# Patient Record
Sex: Male | Born: 1960 | Race: White | Hispanic: No | Marital: Married
Health system: Southern US, Community
[De-identification: ages and names within clinical notes are randomized; demographics above are authoritative.]

## PROBLEM LIST (undated history)

## (undated) DIAGNOSIS — D496 Neoplasm of unspecified behavior of brain: Secondary | ICD-10-CM

---

## 2020-01-22 ENCOUNTER — Other Ambulatory Visit: Payer: Self-pay | Admitting: Family Medicine

## 2020-01-22 ENCOUNTER — Ambulatory Visit
Admission: RE | Admit: 2020-01-22 | Discharge: 2020-01-22 | Disposition: A | Discharge status: Home / Self Care | Payer: BC Managed Care – PPO | Source: Ambulatory Visit | Attending: Family Medicine | Admitting: Family Medicine

## 2020-01-22 DIAGNOSIS — R413 Other amnesia: Secondary | ICD-10-CM

## 2020-01-22 MED ORDER — IOPAMIDOL (ISOVUE-300) INJECTION 61%
75.0000 mL | Freq: Once | INTRAVENOUS | Status: AC | PRN
  Administered 2020-01-22: 75 mL via INTRAVENOUS

## 2020-01-26 ENCOUNTER — Other Ambulatory Visit (HOSPITAL_BASED_OUTPATIENT_CLINIC_OR_DEPARTMENT_OTHER): Payer: Self-pay | Admitting: Neurosurgery

## 2020-01-26 DIAGNOSIS — G9389 Other specified disorders of brain: Secondary | ICD-10-CM

## 2020-01-30 ENCOUNTER — Other Ambulatory Visit: Payer: Self-pay

## 2020-01-30 ENCOUNTER — Ambulatory Visit (HOSPITAL_BASED_OUTPATIENT_CLINIC_OR_DEPARTMENT_OTHER)
Admission: RE | Admit: 2020-01-30 | Discharge: 2020-01-30 | Disposition: A | Discharge status: Home / Self Care | Payer: BC Managed Care – PPO | Source: Ambulatory Visit | Attending: Neurosurgery | Admitting: Neurosurgery

## 2020-01-30 DIAGNOSIS — G9389 Other specified disorders of brain: Secondary | ICD-10-CM | POA: Insufficient documentation

## 2020-01-30 MED ORDER — GADOBUTROL 1 MMOL/ML IV SOLN
10.0000 mL | Freq: Once | INTRAVENOUS | Status: AC | PRN
  Administered 2020-01-30: 10 mL via INTRAVENOUS

## 2020-02-03 ENCOUNTER — Other Ambulatory Visit: Payer: Self-pay | Admitting: Neurosurgery

## 2020-02-11 ENCOUNTER — Inpatient Hospital Stay (HOSPITAL_COMMUNITY)
Admission: RE | Admit: 2020-02-11 | Discharge: 2020-04-12 | DRG: 025 | Disposition: A | Discharge status: Hospice | Payer: BC Managed Care – PPO | Attending: Neurosurgery | Admitting: Neurosurgery

## 2020-02-11 ENCOUNTER — Inpatient Hospital Stay (HOSPITAL_COMMUNITY)
Admission: AD | Admit: 2020-02-11 | Payer: BC Managed Care – PPO | Source: Other Acute Inpatient Hospital | Admitting: Internal Medicine

## 2020-02-11 DIAGNOSIS — J9600 Acute respiratory failure, unspecified whether with hypoxia or hypercapnia: Secondary | ICD-10-CM

## 2020-02-11 DIAGNOSIS — I10 Essential (primary) hypertension: Secondary | ICD-10-CM | POA: Diagnosis not present

## 2020-02-11 DIAGNOSIS — G936 Cerebral edema: Secondary | ICD-10-CM | POA: Diagnosis present

## 2020-02-11 DIAGNOSIS — R4182 Altered mental status, unspecified: Secondary | ICD-10-CM | POA: Diagnosis present

## 2020-02-11 DIAGNOSIS — F419 Anxiety disorder, unspecified: Secondary | ICD-10-CM | POA: Diagnosis present

## 2020-02-11 DIAGNOSIS — Z781 Physical restraint status: Secondary | ICD-10-CM

## 2020-02-11 DIAGNOSIS — R443 Hallucinations, unspecified: Secondary | ICD-10-CM | POA: Diagnosis not present

## 2020-02-11 DIAGNOSIS — J15212 Pneumonia due to Methicillin resistant Staphylococcus aureus: Secondary | ICD-10-CM | POA: Diagnosis present

## 2020-02-11 DIAGNOSIS — R569 Unspecified convulsions: Secondary | ICD-10-CM | POA: Diagnosis not present

## 2020-02-11 DIAGNOSIS — Z978 Presence of other specified devices: Secondary | ICD-10-CM | POA: Diagnosis not present

## 2020-02-11 DIAGNOSIS — C713 Malignant neoplasm of parietal lobe: Secondary | ICD-10-CM | POA: Diagnosis present

## 2020-02-11 DIAGNOSIS — R319 Hematuria, unspecified: Secondary | ICD-10-CM | POA: Diagnosis not present

## 2020-02-11 DIAGNOSIS — R451 Restlessness and agitation: Secondary | ICD-10-CM | POA: Diagnosis not present

## 2020-02-11 DIAGNOSIS — Z20822 Contact with and (suspected) exposure to covid-19: Secondary | ICD-10-CM | POA: Diagnosis present

## 2020-02-11 DIAGNOSIS — I712 Thoracic aortic aneurysm, without rupture: Secondary | ICD-10-CM | POA: Diagnosis present

## 2020-02-11 DIAGNOSIS — Z6836 Body mass index (BMI) 36.0-36.9, adult: Secondary | ICD-10-CM

## 2020-02-11 DIAGNOSIS — Z7189 Other specified counseling: Secondary | ICD-10-CM | POA: Diagnosis not present

## 2020-02-11 DIAGNOSIS — C719 Malignant neoplasm of brain, unspecified: Secondary | ICD-10-CM | POA: Diagnosis not present

## 2020-02-11 DIAGNOSIS — Z79899 Other long term (current) drug therapy: Secondary | ICD-10-CM | POA: Diagnosis not present

## 2020-02-11 DIAGNOSIS — R509 Fever, unspecified: Secondary | ICD-10-CM

## 2020-02-11 DIAGNOSIS — G9349 Other encephalopathy: Secondary | ICD-10-CM | POA: Diagnosis present

## 2020-02-11 DIAGNOSIS — J9811 Atelectasis: Secondary | ICD-10-CM | POA: Diagnosis not present

## 2020-02-11 DIAGNOSIS — F09 Unspecified mental disorder due to known physiological condition: Secondary | ICD-10-CM | POA: Diagnosis not present

## 2020-02-11 DIAGNOSIS — R4702 Dysphasia: Secondary | ICD-10-CM | POA: Diagnosis not present

## 2020-02-11 DIAGNOSIS — I4891 Unspecified atrial fibrillation: Secondary | ICD-10-CM | POA: Diagnosis not present

## 2020-02-11 DIAGNOSIS — G9340 Encephalopathy, unspecified: Secondary | ICD-10-CM

## 2020-02-11 DIAGNOSIS — R4701 Aphasia: Secondary | ICD-10-CM | POA: Diagnosis present

## 2020-02-11 DIAGNOSIS — R482 Apraxia: Secondary | ICD-10-CM | POA: Diagnosis not present

## 2020-02-11 DIAGNOSIS — Z0189 Encounter for other specified special examinations: Secondary | ICD-10-CM

## 2020-02-11 DIAGNOSIS — D649 Anemia, unspecified: Secondary | ICD-10-CM | POA: Diagnosis present

## 2020-02-11 DIAGNOSIS — Z66 Do not resuscitate: Secondary | ICD-10-CM | POA: Diagnosis present

## 2020-02-11 DIAGNOSIS — J9601 Acute respiratory failure with hypoxia: Secondary | ICD-10-CM | POA: Diagnosis not present

## 2020-02-11 DIAGNOSIS — E871 Hypo-osmolality and hyponatremia: Secondary | ICD-10-CM | POA: Diagnosis present

## 2020-02-11 DIAGNOSIS — R414 Neurologic neglect syndrome: Secondary | ICD-10-CM | POA: Diagnosis not present

## 2020-02-11 DIAGNOSIS — I471 Supraventricular tachycardia: Secondary | ICD-10-CM | POA: Diagnosis present

## 2020-02-11 DIAGNOSIS — J9690 Respiratory failure, unspecified, unspecified whether with hypoxia or hypercapnia: Secondary | ICD-10-CM

## 2020-02-11 DIAGNOSIS — J189 Pneumonia, unspecified organism: Secondary | ICD-10-CM

## 2020-02-11 DIAGNOSIS — Z515 Encounter for palliative care: Secondary | ICD-10-CM

## 2020-02-11 DIAGNOSIS — Y95 Nosocomial condition: Secondary | ICD-10-CM | POA: Diagnosis present

## 2020-02-11 DIAGNOSIS — G9341 Metabolic encephalopathy: Secondary | ICD-10-CM | POA: Diagnosis present

## 2020-02-11 DIAGNOSIS — N029 Recurrent and persistent hematuria with unspecified morphologic changes: Secondary | ICD-10-CM | POA: Diagnosis not present

## 2020-02-11 DIAGNOSIS — G9389 Other specified disorders of brain: Secondary | ICD-10-CM | POA: Diagnosis not present

## 2020-02-11 DIAGNOSIS — E876 Hypokalemia: Secondary | ICD-10-CM | POA: Diagnosis present

## 2020-02-11 DIAGNOSIS — I82611 Acute embolism and thrombosis of superficial veins of right upper extremity: Secondary | ICD-10-CM | POA: Diagnosis present

## 2020-02-11 DIAGNOSIS — R0989 Other specified symptoms and signs involving the circulatory and respiratory systems: Secondary | ICD-10-CM

## 2020-02-11 DIAGNOSIS — I480 Paroxysmal atrial fibrillation: Secondary | ICD-10-CM | POA: Diagnosis present

## 2020-02-11 DIAGNOSIS — R471 Dysarthria and anarthria: Secondary | ICD-10-CM | POA: Diagnosis not present

## 2020-02-11 DIAGNOSIS — R4586 Emotional lability: Secondary | ICD-10-CM | POA: Diagnosis not present

## 2020-02-11 DIAGNOSIS — R490 Dysphonia: Secondary | ICD-10-CM | POA: Diagnosis not present

## 2020-02-11 DIAGNOSIS — R609 Edema, unspecified: Secondary | ICD-10-CM | POA: Diagnosis not present

## 2020-02-11 DIAGNOSIS — Z4659 Encounter for fitting and adjustment of other gastrointestinal appliance and device: Secondary | ICD-10-CM

## 2020-02-11 DIAGNOSIS — I159 Secondary hypertension, unspecified: Secondary | ICD-10-CM | POA: Diagnosis not present

## 2020-02-11 DIAGNOSIS — I959 Hypotension, unspecified: Secondary | ICD-10-CM | POA: Diagnosis not present

## 2020-02-11 DIAGNOSIS — T380X5A Adverse effect of glucocorticoids and synthetic analogues, initial encounter: Secondary | ICD-10-CM | POA: Diagnosis not present

## 2020-02-11 LAB — MRSA PCR SCREENING: MRSA by PCR: POSITIVE — AB

## 2020-02-11 LAB — GLUCOSE, CAPILLARY
Glucose-Capillary: 142 mg/dL — ABNORMAL HIGH (ref 70–99)
Glucose-Capillary: 135 mg/dL — ABNORMAL HIGH (ref 70–99)

## 2020-02-11 LAB — RESP PANEL BY RT-PCR (FLU A&B, COVID) ARPGX2
Influenza A by PCR: NEGATIVE
Influenza B by PCR: NEGATIVE
SARS Coronavirus 2 by RT PCR: NEGATIVE

## 2020-02-11 LAB — HIV ANTIBODY (ROUTINE TESTING W REFLEX): HIV Screen 4th Generation wRfx: NONREACTIVE

## 2020-02-11 LAB — HEMOGLOBIN A1C
Hgb A1c MFr Bld: 5.8 % — ABNORMAL HIGH (ref 4.8–5.6)
Mean Plasma Glucose: 119.76 mg/dL

## 2020-02-11 MED ORDER — LORAZEPAM 2 MG/ML IJ SOLN
2.0000 mg | INTRAMUSCULAR | Status: DC | PRN
  Administered 2020-02-12 (×3): 2 mg via INTRAVENOUS
  Administered 2020-02-12 (×2): 1 mg via INTRAVENOUS
  Administered 2020-02-13 (×5): 2 mg via INTRAVENOUS
  Administered 2020-02-13: 4 mg via INTRAVENOUS
  Administered 2020-02-13 – 2020-02-14 (×13): 2 mg via INTRAVENOUS
  Filled 2020-02-11 (×28): qty 1

## 2020-02-11 MED ORDER — INSULIN ASPART 100 UNIT/ML ~~LOC~~ SOLN
0.0000 [IU] | SUBCUTANEOUS | Status: DC
  Administered 2020-02-11 – 2020-02-12 (×3): 2 [IU] via SUBCUTANEOUS
  Administered 2020-02-12: 3 [IU] via SUBCUTANEOUS
  Administered 2020-02-12: 2 [IU] via SUBCUTANEOUS
  Administered 2020-02-12: 3 [IU] via SUBCUTANEOUS
  Administered 2020-02-12: 2 [IU] via SUBCUTANEOUS
  Administered 2020-02-13: 4 [IU] via SUBCUTANEOUS
  Administered 2020-02-13: 3 [IU] via SUBCUTANEOUS
  Administered 2020-02-13: 2 [IU] via SUBCUTANEOUS
  Administered 2020-02-13: 5 [IU] via SUBCUTANEOUS
  Administered 2020-02-13 – 2020-02-14 (×7): 2 [IU] via SUBCUTANEOUS
  Administered 2020-02-14 – 2020-02-15 (×3): 3 [IU] via SUBCUTANEOUS
  Administered 2020-02-15: 2 [IU] via SUBCUTANEOUS
  Administered 2020-02-15: 3 [IU] via SUBCUTANEOUS
  Administered 2020-02-15: 2 [IU] via SUBCUTANEOUS
  Administered 2020-02-15 (×2): 3 [IU] via SUBCUTANEOUS
  Administered 2020-02-16: 2 [IU] via SUBCUTANEOUS
  Administered 2020-02-16: 3 [IU] via SUBCUTANEOUS
  Administered 2020-02-16: 2 [IU] via SUBCUTANEOUS
  Administered 2020-02-16: 3 [IU] via SUBCUTANEOUS
  Administered 2020-02-17 (×3): 2 [IU] via SUBCUTANEOUS
  Administered 2020-02-18 (×3): 3 [IU] via SUBCUTANEOUS
  Administered 2020-02-18 – 2020-03-14 (×53): 2 [IU] via SUBCUTANEOUS
  Administered 2020-03-14: 3 [IU] via SUBCUTANEOUS
  Administered 2020-03-15 – 2020-03-20 (×16): 2 [IU] via SUBCUTANEOUS
  Administered 2020-03-21 (×2): 3 [IU] via SUBCUTANEOUS
  Administered 2020-03-21 – 2020-03-26 (×9): 2 [IU] via SUBCUTANEOUS
  Administered 2020-03-26: 3 [IU] via SUBCUTANEOUS
  Administered 2020-03-28 (×2): 2 [IU] via SUBCUTANEOUS

## 2020-02-11 MED ORDER — HEPARIN SODIUM (PORCINE) 5000 UNIT/ML IJ SOLN
5000.0000 [IU] | Freq: Three times a day (TID) | INTRAMUSCULAR | Status: AC
  Administered 2020-02-11 – 2020-02-16 (×16): 5000 [IU] via SUBCUTANEOUS
  Filled 2020-02-11 (×16): qty 1

## 2020-02-11 MED ORDER — DOCUSATE SODIUM 100 MG PO CAPS: 100.0000 mg | ORAL_CAPSULE | Freq: Two times a day (BID) | ORAL | Status: DC | PRN

## 2020-02-11 MED ORDER — LACTATED RINGERS IV SOLN: INTRAVENOUS | Status: DC

## 2020-02-11 MED ORDER — POLYETHYLENE GLYCOL 3350 17 G PO PACK: 17.0000 g | PACK | Freq: Every day | ORAL | Status: DC | PRN

## 2020-02-11 MED ORDER — LEVETIRACETAM IN NACL 500 MG/100ML IV SOLN
500.0000 mg | Freq: Two times a day (BID) | INTRAVENOUS | Status: DC
  Administered 2020-02-11 – 2020-02-16 (×10): 500 mg via INTRAVENOUS
  Filled 2020-02-11 (×10): qty 100

## 2020-02-11 MED ORDER — DEXAMETHASONE SODIUM PHOSPHATE 4 MG/ML IJ SOLN
4.0000 mg | Freq: Four times a day (QID) | INTRAMUSCULAR | Status: DC
  Administered 2020-02-11 – 2020-02-14 (×11): 4 mg via INTRAVENOUS
  Filled 2020-02-11 (×10): qty 1

## 2020-02-11 MED ORDER — DILTIAZEM HCL-DEXTROSE 125-5 MG/125ML-% IV SOLN (PREMIX)
5.0000 mg/h | INTRAVENOUS | Status: DC
  Administered 2020-02-11 – 2020-02-12 (×2): 10 mg/h via INTRAVENOUS
  Filled 2020-02-11 (×3): qty 125

## 2020-02-11 NOTE — H&P (Signed)
NAME:  Antonio Glenn, MRN:  [id number], DOB:  09-03-1960, LOS: 0 ADMISSION DATE:  02/11/2020, CONSULTATION DATE:  02/11/20 REFERRING MD:  Viona Gilmore  CHIEF COMPLAINT:  AMS / Seizures   Brief History   Antonio Glenn is a 59 y.o. male with hx of recently diagnosed brain tumor who was transferred from [facility] ED to [facility] 11/25 with AMS and seizures.  He is scheduled for left craniotomy on Dec 1 with Dr. Marcello Moores.  History of present illness   Pt is encephelopathic; therefore, this HPI is obtained from chart review. [name] is a 59 y.o. 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  He has been evaluated by Dr. Marcello Moores of [facility] and is scheduled for left craniotomy on Dec 1.   On 11/25, he was seen in his yard by neighbors without a shirt on and acting strange. He was taken to [facility] [place] ED and en route, was noted to have seizure like activity and had right sided weakness.  He was given ativan in ED and CT head demonstrate known 6-7cm mass lesion in left parietal lobe with areas of internal cystic necrosis.  While in ED, he was also noted to have AFRVR and was started on cardizem infusion with conversion to NSR.  Due his neurosurgeon working out of [employer], he was transferred here for ongoing care.  PCCM called for transfer / admission to neuro ICU.  Past Medical History  Brain Mass.  Significant Hospital Events   11/25 > admit.  Consults:  Neurosurgery pending.  Procedures:  None.  Significant Diagnostic Tests:  CT head 11/25 > 6 - 7cm mass in left parietal lobe with areas of internal cystic necrosis.  Mild regional mass effect with no MLS.   EEG 11/25 >   Micro Data:  Flu 11/25 >  COVID 11/25 >   Antimicrobials:  None.   Interim history/subjective:  Opens eyes to noxious stimuli.   Follows basic commands after several asks and prompts.  Slow to respond.  Confused, unable to tell me what year it is or where he is.  Objective:  Blood pressure (!) 156/80, pulse 82, temperature 99 F (37.2 C), temperature source Axillary, resp. rate 19, height 6\' 3"  (1.905 m), weight (!) 148.3 kg, SpO2 92 %.        Intake/Output Summary (Last 24 hours) at 02/11/2020 2004 Last data filed at 02/11/2020 1830 Gross per 24 hour  Intake --  Output 500 ml  Net -500 ml   Filed Weights   02/11/20 1820  Weight: (!) 148.3 kg    Examination: General: Adult male, in NAD. Neuro: Opens eyes, slow to respond to basic commands after several asks and prompts.  Unable to tell me where he is or what year it is. HEENT: [place]/AT. Sclerae anicteric.  EOMI. Cardiovascular: RRR, no M/R/G.  Lungs: Respirations even and unlabored.  CTA bilaterally, No W/R/R.  Abdomen: BS x 4, soft, NT/ND.  Musculoskeletal: No gross deformities, no edema.  Skin: Intact, warm, no rashes.  Assessment & Plan:   Acute encephalopathy - unclear etiology but likely related to recently found left parietal brain mass. - Empiric Decadron (did receive 10mg  x 1 in ED). - Day team to please notify neurosurgery of pt's admission (he is scheduled for left craniotomy on 02/17/20  with Dr. Marcello Moores).  Seizures - 2/2 above. - ICU monitoring with frequent neuro checks. - Ativan PRN. - Empiric Keppra 500mg  BID (recently started home med). - EEG.  A.fib with RVR - resolved after starting Cardizem, now in NSR. - Continue Cardizem. - Defer anticoagulation for now until he is seen by neurosurgery in AM. - Monitor electrolytes.    Best Practice:  Diet: NPO. Pain/Anxiety/Delirium protocol (if indicated): N/A. VAP protocol (if indicated): N/A. DVT prophylaxis: SCD's / Heparin. GI prophylaxis: N/A. Glucose control: SSI. Mobility: Bedrest. Code Status: Full. Family Communication: Updated pt's daughter Lakendrick Paradis over the  phone. Disposition: ICU.  Labs   CBC: No results for input(s): WBC, NEUTROABS, HGB, HCT, MCV, PLT in the last 168 hours. Basic Metabolic Panel: No results for input(s): NA, K, CL, CO2, GLUCOSE, BUN, CREATININE, CALCIUM, MG, PHOS in the last 168 hours. GFR: CrCl cannot be calculated (No successful lab value found.). No results for input(s): PROCALCITON, WBC, LATICACIDVEN in the last 168 hours. Liver Function Tests: No results for input(s): AST, ALT, ALKPHOS, BILITOT, PROT, ALBUMIN in the last 168 hours. No results for input(s): LIPASE, AMYLASE in the last 168 hours. No results for input(s): AMMONIA in the last 168 hours. ABG No results found for: PHART, PCO2ART, PO2ART, HCO3, TCO2, ACIDBASEDEF, O2SAT  Coagulation Profile: No results for input(s): INR, PROTIME in the last 168 hours. Cardiac Enzymes: No results for input(s): CKTOTAL, CKMB, CKMBINDEX, TROPONINI in the last 168 hours. HbA1C: No results found for: HGBA1C CBG: Recent Labs  Lab 02/11/20 1927  GLUCAP 142*    Review of Systems:   Unable to obtain as pt is encephalopathic.  Past medical history  Brain Mass.  Surgical History   Not available.  Social History   Not available.  Family history   His family history is not on file.   Allergies No Known Allergies   Home meds  Prior to Admission medications   Medication Sig Start Date End Date Taking? Authorizing Provider  levETIRAcetam (KEPPRA) 500 MG tablet Take 500 mg by mouth 2 (two) times daily. 01/26/20  Yes [provider]    Critical care time: 40 min.    Montey Hora, [facility] 02/11/2020, 8:04 PM

## 2020-02-11 NOTE — Progress Notes (Signed)
eLink Physician-Brief Progress Note Patient Name: Antonio Glenn DOB: [birth year] MRN: [id number]   Date of Service  02/11/2020  HPI/Events of Note  Patient with altered mental status, witnessed seizure, and left parietal intraparenchymal brain mass r/o neoplasm, transferred from [facility] [place] to [facility] in preparation for scheduled craniotomy on 02/17/20.  eICU Interventions  New Patient Evaluation completed, Keppra, cEEG, Neurosurgery consultation.        [name] 02/11/2020, 8:15 PM

## 2020-02-12 ENCOUNTER — Inpatient Hospital Stay (HOSPITAL_COMMUNITY): Payer: BC Managed Care – PPO

## 2020-02-12 DIAGNOSIS — R569 Unspecified convulsions: Secondary | ICD-10-CM | POA: Diagnosis not present

## 2020-02-12 DIAGNOSIS — G9340 Encephalopathy, unspecified: Secondary | ICD-10-CM | POA: Diagnosis not present

## 2020-02-12 LAB — BASIC METABOLIC PANEL
Anion gap: 11 (ref 5–15)
BUN: 13 mg/dL (ref 6–20)
CO2: 20 mmol/L — ABNORMAL LOW (ref 22–32)
Calcium: 7.7 mg/dL — ABNORMAL LOW (ref 8.9–10.3)
Chloride: 103 mmol/L (ref 98–111)
Creatinine, Ser: 0.76 mg/dL (ref 0.61–1.24)
GFR, Estimated: >60 mL/min (ref >60)
Glucose, Bld: 141 mg/dL — ABNORMAL HIGH (ref 70–99)
Potassium: 4 mmol/L (ref 3.5–5.1)
Sodium: 134 mmol/L — ABNORMAL LOW (ref 135–145)

## 2020-02-12 LAB — GLUCOSE, CAPILLARY
Glucose-Capillary: 134 mg/dL — ABNORMAL HIGH (ref 70–99)
Glucose-Capillary: 136 mg/dL — ABNORMAL HIGH (ref 70–99)
Glucose-Capillary: 130 mg/dL — ABNORMAL HIGH (ref 70–99)
Glucose-Capillary: 151 mg/dL — ABNORMAL HIGH (ref 70–99)
Glucose-Capillary: 153 mg/dL — ABNORMAL HIGH (ref 70–99)
Glucose-Capillary: 136 mg/dL — ABNORMAL HIGH (ref 70–99)

## 2020-02-12 LAB — CBC
HCT: 44.8 % (ref 39.0–52.0)
Hemoglobin: 15.1 g/dL (ref 13.0–17.0)
MCH: 31.3 pg (ref 26.0–34.0)
MCHC: 33.7 g/dL (ref 30.0–36.0)
MCV: 92.8 fL (ref 80.0–100.0)
Platelets: 266 10*3/uL (ref 150–400)
RBC: 4.83 MIL/uL (ref 4.22–5.81)
RDW: 13.7 % (ref 11.5–15.5)
WBC: 11 10*3/uL — ABNORMAL HIGH (ref 4.0–10.5)
nRBC: 0 % (ref 0.0–0.2)

## 2020-02-12 LAB — PHOSPHORUS: Phosphorus: 2.5 mg/dL (ref 2.5–4.6)

## 2020-02-12 LAB — MAGNESIUM: Magnesium: 2.3 mg/dL (ref 1.7–2.4)

## 2020-02-12 MED ORDER — MUPIROCIN 2 % EX OINT
1.0000 "application " | TOPICAL_OINTMENT | Freq: Two times a day (BID) | CUTANEOUS | Status: AC
  Administered 2020-02-12 – 2020-02-16 (×10): 1 via NASAL
  Filled 2020-02-12 (×2): qty 22

## 2020-02-12 MED ORDER — DILTIAZEM HCL 60 MG PO TABS
60.0000 mg | ORAL_TABLET | Freq: Four times a day (QID) | ORAL | Status: DC
  Filled 2020-02-12 (×2): qty 1

## 2020-02-12 MED ORDER — HALOPERIDOL LACTATE 5 MG/ML IJ SOLN
INTRAMUSCULAR | Status: AC
  Administered 2020-02-12: 5 mg via INTRAVENOUS
  Filled 2020-02-12: qty 1

## 2020-02-12 MED ORDER — DEXMEDETOMIDINE HCL IN NACL 400 MCG/100ML IV SOLN
0.4000 ug/kg/h | INTRAVENOUS | Status: DC
  Administered 2020-02-12 – 2020-02-13 (×6): 1.2 ug/kg/h via INTRAVENOUS
  Administered 2020-02-13 (×2): 2 ug/kg/h via INTRAVENOUS
  Administered 2020-02-13 (×2): 1.2 ug/kg/h via INTRAVENOUS
  Administered 2020-02-13: 2 ug/kg/h via INTRAVENOUS
  Administered 2020-02-13: 1.2 ug/kg/h via INTRAVENOUS
  Administered 2020-02-13: 2 ug/kg/h via INTRAVENOUS
  Administered 2020-02-13: 1.2 ug/kg/h via INTRAVENOUS
  Administered 2020-02-14: 1.5 ug/kg/h via INTRAVENOUS
  Administered 2020-02-14: 2 ug/kg/h via INTRAVENOUS
  Administered 2020-02-14: 1.5 ug/kg/h via INTRAVENOUS
  Administered 2020-02-14: 2 ug/kg/h via INTRAVENOUS
  Administered 2020-02-14: 1.5 ug/kg/h via INTRAVENOUS
  Administered 2020-02-14 (×4): 2 ug/kg/h via INTRAVENOUS
  Administered 2020-02-14 – 2020-02-15 (×3): 1.5 ug/kg/h via INTRAVENOUS
  Administered 2020-02-15 (×2): 2 ug/kg/h via INTRAVENOUS
  Administered 2020-02-15 (×3): 1.5 ug/kg/h via INTRAVENOUS
  Administered 2020-02-15: 2 ug/kg/h via INTRAVENOUS
  Administered 2020-02-15 (×2): 1.5 ug/kg/h via INTRAVENOUS
  Administered 2020-02-15 (×4): 2 ug/kg/h via INTRAVENOUS
  Administered 2020-02-15: 1.5 ug/kg/h via INTRAVENOUS
  Administered 2020-02-16 (×3): 2 ug/kg/h via INTRAVENOUS
  Administered 2020-02-16: 1.8 ug/kg/h via INTRAVENOUS
  Administered 2020-02-16: 1.9 ug/kg/h via INTRAVENOUS
  Administered 2020-02-16: 2 ug/kg/h via INTRAVENOUS
  Administered 2020-02-16: 1.5 ug/kg/h via INTRAVENOUS
  Administered 2020-02-16: 1.8 ug/kg/h via INTRAVENOUS
  Administered 2020-02-16 (×2): 1.9 ug/kg/h via INTRAVENOUS
  Administered 2020-02-16: 1.8 ug/kg/h via INTRAVENOUS
  Administered 2020-02-16: 1.5 ug/kg/h via INTRAVENOUS
  Administered 2020-02-16: 1.9 ug/kg/h via INTRAVENOUS
  Administered 2020-02-16 – 2020-02-17 (×3): 2 ug/kg/h via INTRAVENOUS
  Administered 2020-02-17: 1.8 ug/kg/h via INTRAVENOUS
  Administered 2020-02-17 (×2): 2 ug/kg/h via INTRAVENOUS
  Administered 2020-02-17: 2.001 ug/kg/h via INTRAVENOUS
  Administered 2020-02-17 (×2): 2 ug/kg/h via INTRAVENOUS
  Filled 2020-02-12 (×2): qty 200
  Filled 2020-02-12 (×2): qty 100
  Filled 2020-02-12 (×3): qty 200
  Filled 2020-02-12 (×3): qty 100
  Filled 2020-02-12: qty 200
  Filled 2020-02-12 (×3): qty 100
  Filled 2020-02-12: qty 200
  Filled 2020-02-12: qty 100
  Filled 2020-02-12: qty 200
  Filled 2020-02-12: qty 100
  Filled 2020-02-12: qty 200
  Filled 2020-02-12: qty 100
  Filled 2020-02-12: qty 200
  Filled 2020-02-12: qty 100
  Filled 2020-02-12: qty 200
  Filled 2020-02-12 (×4): qty 100
  Filled 2020-02-12: qty 200
  Filled 2020-02-12: qty 100
  Filled 2020-02-12: qty 300
  Filled 2020-02-12 (×3): qty 200
  Filled 2020-02-12: qty 100
  Filled 2020-02-12: qty 200
  Filled 2020-02-12 (×2): qty 100
  Filled 2020-02-12 (×5): qty 200
  Filled 2020-02-12: qty 100

## 2020-02-12 MED ORDER — IOHEXOL 300 MG/ML  SOLN
100.0000 mL | Freq: Once | INTRAMUSCULAR | Status: AC | PRN
  Administered 2020-02-12: 100 mL via INTRAVENOUS

## 2020-02-12 MED ORDER — DILTIAZEM HCL 60 MG PO TABS
60.0000 mg | ORAL_TABLET | Freq: Four times a day (QID) | ORAL | Status: DC
  Administered 2020-02-12 – 2020-02-13 (×3): 60 mg via ORAL
  Filled 2020-02-12 (×16): qty 1

## 2020-02-12 MED ORDER — HALOPERIDOL LACTATE 5 MG/ML IJ SOLN
5.0000 mg | Freq: Four times a day (QID) | INTRAMUSCULAR | Status: DC | PRN
  Administered 2020-02-12 – 2020-02-17 (×11): 5 mg via INTRAVENOUS
  Filled 2020-02-12 (×16): qty 1

## 2020-02-12 MED ORDER — CHLORHEXIDINE GLUCONATE CLOTH 2 % EX PADS
6.0000 | MEDICATED_PAD | Freq: Every day | CUTANEOUS | Status: AC
  Administered 2020-02-13 – 2020-02-16 (×5): 6 via TOPICAL

## 2020-02-12 NOTE — Progress Notes (Signed)
Pt got up to urinate and this RN noticed bright red blood in urine and around tip of penis. Alerted Dr. Tacy Learn who provided no new orders. Will continue to monitor.

## 2020-02-12 NOTE — Consult Note (Signed)
Reason for Consult: Left parietal mass Referring Physician: Critical care  Antonio Glenn is an 59 y.o. male.  HPI: 59 year old gentleman with known ring-enhancing mass left parietal occipital lobe consistent with probable [place] patient was scheduled for resection on December 1.  Patient was noted to be disoriented disheveled and and with mental status changes was taken to the emergency room at [facility] was noted to have a seizure was loaded on Keppra and transferred here.  No seizures noted at [facility] overnight.  No past medical history on file.    No family history on file.  Social History:  has no history on file for tobacco use, alcohol use, and drug use.  Allergies: No Known Allergies  Medications:   Results for orders placed or performed during the hospital encounter of 02/11/20 (from the past 48 hour(s))  Glucose, capillary     Status: Abnormal   Collection Time: 02/11/20  7:27 PM  Result Value Ref Range   Glucose-Capillary 142 (H) 70 - 99 mg/dL    Comment: Glucose reference range applies only to samples taken after fasting for at least 8 hours.  MRSA PCR Screening     Status: Abnormal   Collection Time: 02/11/20  7:48 PM   Specimen: Nasopharyngeal Swab  Result Value Ref Range   MRSA by PCR POSITIVE (A) NEGATIVE    Comment:        The GeneXpert MRSA Assay (FDA approved for NASAL specimens only), is one component of a comprehensive MRSA colonization surveillance program. It is not intended to diagnose MRSA infection nor to guide or monitor treatment for MRSA infections. RESULT CALLED TO, READ BACK BY AND VERIFIED WITH: C RUECKEL RN 02/11/20 2242 JDW Performed at [facility], [place] [address] [place], [place] [address]   Resp Panel by RT-PCR (Flu A&B, Covid)     Status: None   Collection Time: 02/11/20  7:48 PM  Result Value Ref Range   SARS Coronavirus 2 by RT PCR NEGATIVE NEGATIVE    Comment: (NOTE) SARS-CoV-2 target nucleic acids are NOT  DETECTED.  The SARS-CoV-2 RNA is generally detectable in upper respiratory specimens during the acute phase of infection. The lowest concentration of SARS-CoV-2 viral copies this assay can detect is 138 copies/mL. A negative result does not preclude SARS-Cov-2 infection and should not be used as the sole basis for treatment or other patient management decisions. A negative result may occur with  improper specimen collection/handling, submission of specimen other than nasopharyngeal swab, presence of viral mutation(s) within the areas targeted by this assay, and inadequate number of viral copies(<138 copies/mL). A negative result must be combined with clinical observations, patient history, and epidemiological information. The expected result is Negative.  Fact Sheet for Patients:  EntrepreneurPulse.com.au  Fact Sheet for Healthcare Providers:  IncredibleEmployment.be  This test is no t yet approved or cleared by the [place] FDA and  has been authorized for detection and/or diagnosis of SARS-CoV-2 by FDA under an Emergency Use Authorization (EUA). This EUA will remain  in effect (meaning this test can be used) for the duration of the COVID-19 declaration under Section 564(b)(1) of the Act, 21 U.S.C.section 360bbb-3(b)(1), unless the authorization is terminated  or revoked sooner.       Influenza A by PCR NEGATIVE NEGATIVE   Influenza B by PCR NEGATIVE NEGATIVE    Comment: (NOTE) The Xpert Xpress SARS-CoV-2/FLU/RSV plus assay is intended as an aid in the diagnosis of influenza from Nasopharyngeal swab specimens and  should not be used as a sole basis for treatment. Nasal washings and aspirates are unacceptable for Xpert Xpress SARS-CoV-2/FLU/RSV testing.  Fact Sheet for Patients: EntrepreneurPulse.com.au  Fact Sheet for Healthcare Providers: IncredibleEmployment.be  This test is not yet approved or  cleared by the [place] FDA and has been authorized for detection and/or diagnosis of SARS-CoV-2 by FDA under an Emergency Use Authorization (EUA). This EUA will remain in effect (meaning this test can be used) for the duration of the COVID-19 declaration under Section 564(b)(1) of the Act, 21 U.S.C. section 360bbb-3(b)(1), unless the authorization is terminated or revoked.  Performed at [facility], [place] [address] [place], [place] [address]   HIV Antibody (routine testing w rflx)     Status: None   Collection Time: 02/11/20  8:11 PM  Result Value Ref Range   HIV Screen 4th Generation wRfx Non Reactive Non Reactive    Comment: Performed at [facility], [place] [address], [place], [place] [address]  Hemoglobin A1c     Status: Abnormal   Collection Time: 02/11/20  8:11 PM  Result Value Ref Range   Hgb A1c MFr Bld 5.8 (H) 4.8 - 5.6 %    Comment: (NOTE) Pre diabetes:          5.7%-6.4%  Diabetes:              >6.4%  Glycemic control for   <7.0% adults with diabetes    Mean Plasma Glucose 119.76 mg/dL    Comment: Performed at [facility] [address] [place], [place] [address]  Glucose, capillary     Status: Abnormal   Collection Time: 02/11/20 11:20 PM  Result Value Ref Range   Glucose-Capillary 135 (H) 70 - 99 mg/dL    Comment: Glucose reference range applies only to samples taken after fasting for at least 8 hours.  CBC     Status: Abnormal   Collection Time: 02/12/20 12:52 AM  Result Value Ref Range   WBC 11.0 (H) 4.0 - 10.5 K/uL   RBC 4.83 4.22 - 5.81 MIL/uL   Hemoglobin 15.1 13.0 - 17.0 g/dL   HCT 44.8 39 - 52 %   MCV 92.8 80.0 - 100.0 fL   MCH 31.3 26.0 - 34.0 pg   MCHC 33.7 30.0 - 36.0 g/dL   RDW 13.7 11.5 - 15.5 %   Platelets 266 150 - 400 K/uL   nRBC 0.0 0.0 - 0.2 %    Comment: Performed at [facility], [place] [address] [place], [place] [address]  Basic metabolic panel     Status: Abnormal   Collection Time: 02/12/20 12:52 AM   Result Value Ref Range   Sodium 134 (L) 135 - 145 mmol/L   Potassium 4.0 3.5 - 5.1 mmol/L   Chloride 103 98 - 111 mmol/L   CO2 20 (L) 22 - 32 mmol/L   Glucose, Bld 141 (H) 70 - 99 mg/dL    Comment: Glucose reference range applies only to samples taken after fasting for at least 8 hours.   BUN 13 6 - 20 mg/dL   Creatinine, Ser 0.76 0.61 - 1.24 mg/dL   Calcium 7.7 (L) 8.9 - 10.3 mg/dL   GFR, Estimated >60 >60 mL/min    Comment: (NOTE) Calculated using the CKD-EPI Creatinine Equation (2021)    Anion gap 11 5 - 15    Comment: Performed at [facility] [address], [place], [place] [address]  Magnesium  Status: None   Collection Time: 02/12/20 12:52 AM  Result Value Ref Range   Magnesium 2.3 1.7 - 2.4 mg/dL    Comment: Performed at [facility], [place] [address] [place], [place] [address]  Phosphorus     Status: None   Collection Time: 02/12/20 12:52 AM  Result Value Ref Range   Phosphorus 2.5 2.5 - 4.6 mg/dL    Comment: Performed at [facility], [place] [address] [place], [place] [address]  Glucose, capillary     Status: Abnormal   Collection Time: 02/12/20  3:17 AM  Result Value Ref Range   Glucose-Capillary 134 (H) 70 - 99 mg/dL    Comment: Glucose reference range applies only to samples taken after fasting for at least 8 hours.  Glucose, capillary     Status: Abnormal   Collection Time: 02/12/20  7:23 AM  Result Value Ref Range   Glucose-Capillary 136 (H) 70 - 99 mg/dL    Comment: Glucose reference range applies only to samples taken after fasting for at least 8 hours.    DG Chest Port 1 View  Result Date: 02/12/2020 CLINICAL DATA:  Respiratory failure. EXAM: PORTABLE CHEST 1 VIEW COMPARISON:  No prior. FINDINGS: Cardiomegaly. Low lung volumes with bibasilar atelectasis. Mild bilateral interstitial prominence. Mild interstitial edema and/or pneumonitis cannot be excluded. Small left pleural effusion cannot be excluded. No pneumothorax. IMPRESSION: 1.  Cardiomegaly. 2. Low lung volumes with bibasilar atelectasis. Mild bilateral interstitial prominence. Mild interstitial edema and/or pneumonitis cannot be excluded. Small left pleural effusion cannot be excluded. Electronically Signed   By: Marcello Moores  Register   On: 02/12/2020 05:54    Review of Systems  Unable to perform ROS: Mental status change   Blood pressure 109/62, pulse 70, temperature 99.1 F (37.3 C), temperature source Axillary, resp. rate 18, height 6\' 3"  (1.905 m), weight (!) 148.3 kg, SpO2 94 %. Physical Exam Neurological:     Comments: Patient is awake and alert has a mixed dysphagia with components of both receptive and expressive.  Cranial nerves appear to be intact motor appears to be 5 out of 5 and symmetric bilateral upper and lower extremities     Assessment/Plan: 59 year old gentleman with left parietal occipital ring-enhancing mass.  Will order follow-up CT scan with without contrast continue on steroids and Keppra for seizure prophylaxis will stabilize over the weekend for surgical resection per Dr. Marcello Moores on Wednesday.  Elaina Hoops 02/12/2020, 8:28 AM

## 2020-02-12 NOTE — Progress Notes (Addendum)
Verbal order by Dr. Tacy Learn for bedside swallow to begin PO medications.

## 2020-02-12 NOTE — Progress Notes (Signed)
EEG complete - results pending 

## 2020-02-12 NOTE — Progress Notes (Addendum)
NAME:  Antonio Glenn, MRN:  [id number], DOB:  09-15-1960, LOS: 1 ADMISSION DATE:  02/11/2020, CONSULTATION DATE:  02/11/20 REFERRING MD:  Viona Gilmore  CHIEF COMPLAINT:  AMS / Seizures   Brief History   Antonio Glenn is a 59 y.o. male with hx of recently diagnosed brain tumor who was transferred from [facility] ED to [facility] 11/25 with AMS and seizures.  He is scheduled for left craniotomy on Dec 1 with Dr. Marcello Moores.  Past Medical History  Brain Mass.  Significant Hospital Events   11/25 > admit.  Consults:  Neurosurgery  Procedures:  None.  Significant Diagnostic Tests:  CT head 11/25 > 6 - 7cm mass in left parietal lobe with areas of internal cystic necrosis.  Mild regional mass effect with no MLS.   EEG 11/25 >   Micro Data:  Flu 11/25 >  COVID 11/25 >   Antimicrobials:  None.   Interim history/subjective:  Patient is awake, has significant receptive and expressive aphasia.  No clinical seizure noted since he was transferred to [facility].  Objective:  Blood pressure (!) 112/56, pulse 70, temperature 99.1 F (37.3 C), temperature source Axillary, resp. rate (!) 21, height 6\' 3"  (1.905 m), weight (!) 148.3 kg, SpO2 94 %.        Intake/Output Summary (Last 24 hours) at 02/12/2020 0935 Last data filed at 02/12/2020 0900 Gross per 24 hour  Intake 240.97 ml  Output 1600 ml  Net -1359.03 ml   Filed Weights   02/11/20 1820  Weight: (!) 148.3 kg    Examination: General: Middle-aged morbidly obese male, lying on the bed Neuro: Opens eyes, has significant receptive and expressive aphasia, moving all 4 extremities spontaneously.  Pupils 4 mm bilateral reactive to light HEENT: [place]/AT. Sclerae anicteric.  Moist mucous membranes EOMI. Cardiovascular: RRR, no M/R/G.  Lungs: Respirations even and unlabored.  CTA bilaterally, No W/R/R.  Abdomen: BS x 4, soft, NT/ND.  Musculoskeletal: No gross deformities, no edema.  Skin: Intact, warm, no rashes.  Assessment & Plan:   Acute encephalopathy due to seizure and left parietal tumor Seizure episode 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 Paroxysmal atrial fibrillation with rapid ventricular response Morbid obesity  Patient remained alert and awake but he has significant receptive and expressive aphasia No more clinical seizures noted since he was admitted to [place] 500 mg twice daily Continue lorazepam as needed for seizure activity Continuous EEG has been placed, will follow results Neurosurgery follow-up is appreciated, patient is a scheduled for resection of left parietal mass on 12/1 by Dr. Marcello Moores Continue dexamethasone 4 mg every 6 hours Patient's heart rate is well controlled, he converted to sinus rhythm last night We will start him on oral diltiazem 60 mg every 6 hours and try to taper off Cardizem infusion   Best Practice:  Diet: NPO.  Speech and swallow evaluation Pain/Anxiety/Delirium protocol (if indicated): N/A. VAP protocol (if indicated): N/A. DVT prophylaxis: SCD's / Heparin. GI prophylaxis: N/A. Glucose control: SSI. Mobility: Bedrest. Code Status: Full. Family Communication: We will update patient's family Disposition: ICU.  Labs   CBC: Recent Labs  Lab 02/12/20 0052  WBC 11.0*  HGB 15.1  HCT 44.8  MCV 92.8  PLT 409   Basic Metabolic Panel: Recent Labs  Lab 02/12/20 0052  NA 134*  K 4.0  CL 103  CO2 20*  GLUCOSE 141*  BUN 13  CREATININE 0.76  CALCIUM 7.7*  MG 2.3  PHOS 2.5  GFR: Estimated Creatinine Clearance: 152.8 mL/min (by C-G formula based on SCr of 0.76 mg/dL). Recent Labs  Lab 02/12/20 0052  WBC 11.0*   Liver Function Tests: No results for input(s): AST, ALT, ALKPHOS, BILITOT, PROT, ALBUMIN in the last 168 hours. No results for input(s): LIPASE, AMYLASE in the last 168 hours. No results for input(s): AMMONIA in the last 168 hours. ABG No results found for: PHART, PCO2ART, PO2ART, HCO3, TCO2,  ACIDBASEDEF, O2SAT  Coagulation Profile: No results for input(s): INR, PROTIME in the last 168 hours. Cardiac Enzymes: No results for input(s): CKTOTAL, CKMB, CKMBINDEX, TROPONINI in the last 168 hours. HbA1C: Hgb A1c MFr Bld  Date/Time Value Ref Range Status  02/11/2020 08:11 PM 5.8 (H) 4.8 - 5.6 % Final    Comment:    (NOTE) Pre diabetes:          5.7%-6.4%  Diabetes:              >6.4%  Glycemic control for   <7.0% adults with diabetes    CBG: Recent Labs  Lab 02/11/20 1927 02/11/20 2320 02/12/20 0317 02/12/20 0723  GLUCAP 142* 135* 134* 136*    Review of Systems:   Unable to obtain as pt is encephalopathic.  Past medical history  Brain Mass.  Surgical History   Not available.  Social History   Not available.  Family history   His family history is not on file.   Allergies No Known Allergies   Home meds  Prior to Admission medications   Medication Sig Start Date End Date Taking? Authorizing Provider  levETIRAcetam (KEPPRA) 500 MG tablet Take 500 mg by mouth 2 (two) times daily. 01/26/20  Yes [provider]    Total critical care time: 41 minutes  Performed by: [place] care time was exclusive of separately billable procedures and treating other patients.   Critical care was necessary to treat or prevent imminent or life-threatening deterioration.   Critical care was time spent personally by me on the following activities: development of treatment plan with patient and/or surrogate as well as nursing, discussions with consultants, evaluation of patient's response to treatment, examination of patient, obtaining history from patient or surrogate, ordering and performing treatments and interventions, ordering and review of laboratory studies, ordering and review of radiographic studies, pulse oximetry and re-evaluation of patient's condition.   Jacky Kindle MD Critical care physician [facility]  Pager:  [phone] Mobile: [phone]

## 2020-02-12 NOTE — Procedures (Addendum)
Patient Name: Antonio Glenn  MRN: [id number]  Epilepsy Attending: Lora Havens  Referring Physician/Provider: Montey Hora, PA Date: 02/12/2020 Duration: 24.04 minutes  Patient history: 59 year old male found to have left parietal brain mass concerning for neoplasm was noted to have seizure-like activity.  EEG to evaluate for seizures.  Level of alertness: Awake  AEDs during EEG study: Keppra  Technical aspects: This EEG study was done with scalp electrodes positioned according to the 10-20 International system of electrode placement. Electrical activity was acquired at a sampling rate of 500Hz  and reviewed with a high frequency filter of 70Hz  and a low frequency filter of 1Hz . EEG data were recorded continuously and digitally stored.   Description: The posterior dominant rhythm consists of 8-9 Hz activity of moderate voltage (25-35 uV) seen predominantly in posterior head regions, asymmetric ( L<R) and reactive to eye opening and eye closing.  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  Continuous 3-5Hz  theta-delta slowing was also noted in left hemisphere, maximal left temporal region.  Hyperventilation and photic stimulation were not performed.     ABNORMALITY - Periodic epileptiform discharges, left frontotemporal region - Continuous slow, left hemisphere, maximal left temporal region.  IMPRESSION: This study showed evidence of epileptogenicity arising from left frontotemporal as well as cortical dysfunction in left hemisphere, maximal left temporal region region due to underlying mass.  No seizures were seen throughout the recording.  Linnie Delgrande Barbra Sarks

## 2020-02-12 NOTE — Progress Notes (Signed)
Pt traveled to CT without nurse per verbal order from Dr. Tacy Learn. CT tech called and informed this nurse that pt had attempted to get off CT table several times and refused to keep his legs on the table.  Prior to pt's return to 4NICU, I received a call that the patient was out of bed on the unit next door. When I arrived, patient was attempting to stand to urinate. Along with other staff, I was able to get patient back into bed and return to his room. Placed patient in Posey belt and helped him urinate into a urinal.  Before I was able to leave the room, patient had gotten himself out of posey and was getting up out of bed stating he needed to go home. 2mg  of Ativan given. Patient removed all equipment and walked into the hallway. Several staff members were able to get patient into wheelchair and back to his room. Security arrived and assisted in returning patient to bed. Patient given 5mg  Haldol per verbal order from Dr. Tacy Learn.   Patient placed in bilateral wrist restraints and mitts and a condom catheter was placed on patient.  Approximately 10 minutes following Haldol administration, patient remained combative, attempting to get out of bed, hitting side rails, kicking footboard, and tossing and turning.  Verbal order for Precedex gtt given by Dr. Tacy Learn. Precedex started at 1.2mg /kg/hr. 30 minutes after gtt started, patient continues to fight restraints and thrash in bed.  Will continue to monitor.

## 2020-02-13 DIAGNOSIS — G9340 Encephalopathy, unspecified: Secondary | ICD-10-CM

## 2020-02-13 LAB — GLUCOSE, CAPILLARY
Glucose-Capillary: 140 mg/dL — ABNORMAL HIGH (ref 70–99)
Glucose-Capillary: 138 mg/dL — ABNORMAL HIGH (ref 70–99)
Glucose-Capillary: 140 mg/dL — ABNORMAL HIGH (ref 70–99)
Glucose-Capillary: 143 mg/dL — ABNORMAL HIGH (ref 70–99)
Glucose-Capillary: 144 mg/dL — ABNORMAL HIGH (ref 70–99)
Glucose-Capillary: 155 mg/dL — ABNORMAL HIGH (ref 70–99)

## 2020-02-13 MED ORDER — ORAL CARE MOUTH RINSE
15.0000 mL | Freq: Two times a day (BID) | OROMUCOSAL | Status: DC
  Administered 2020-02-13: 15 mL via OROMUCOSAL

## 2020-02-13 MED ORDER — MIDAZOLAM HCL 10 MG/2ML IJ SOLN
10.0000 mg | Freq: Once | INTRAMUSCULAR | Status: DC
  Filled 2020-02-13: qty 2

## 2020-02-13 MED ORDER — MIDAZOLAM HCL (PF) 5 MG/ML IJ SOLN: 10.0000 mg | Freq: Once | INTRAMUSCULAR | Status: DC

## 2020-02-13 MED ORDER — CHLORHEXIDINE GLUCONATE 0.12 % MT SOLN
15.0000 mL | Freq: Two times a day (BID) | OROMUCOSAL | Status: DC
  Administered 2020-02-13 – 2020-02-17 (×7): 15 mL via OROMUCOSAL
  Filled 2020-02-13 (×6): qty 15

## 2020-02-13 MED ORDER — HALOPERIDOL LACTATE 5 MG/ML IJ SOLN
5.0000 mg | Freq: Once | INTRAMUSCULAR | Status: AC
  Administered 2020-02-13: 5 mg via INTRAMUSCULAR

## 2020-02-13 MED ORDER — MIDAZOLAM HCL 2 MG/2ML IJ SOLN
INTRAMUSCULAR | Status: AC
  Filled 2020-02-13: qty 10

## 2020-02-13 MED ORDER — HYDRALAZINE HCL 20 MG/ML IJ SOLN
10.0000 mg | INTRAMUSCULAR | Status: DC | PRN
  Administered 2020-02-13 – 2020-02-16 (×9): 10 mg via INTRAVENOUS
  Filled 2020-02-13 (×9): qty 1

## 2020-02-13 MED ORDER — ORAL CARE MOUTH RINSE
15.0000 mL | Freq: Two times a day (BID) | OROMUCOSAL | Status: DC
  Administered 2020-02-14 – 2020-02-16 (×6): 15 mL via OROMUCOSAL

## 2020-02-13 NOTE — Progress Notes (Signed)
Patient extremely agitated in bed attempting to sit up and tear off restraints, both PIV in left hand found torn out. Patient not able to be re-directed, no appropriate behavior. Margo Aye, NP notified and one time 5mg  IM Haldol ordered.  Candy Sledge, RN

## 2020-02-13 NOTE — Progress Notes (Signed)
eLink Physician-Brief Progress Note Patient Name: Antonio Glenn DOB: July 10, 1960 MRN: [id number]   Date of Service  02/13/2020  HPI/Events of Note  Hypertension. NPO. Requesting IV PRN medication.  Current BP 181/110 (130). HR 60.   eICU Interventions  Ordered hydralazine 10mg  IV Q4H PRN SBP >\= 170.     Intervention Category Major Interventions: Hypertension - evaluation and management  Charlott Rakes 02/13/2020, 11:17 PM

## 2020-02-13 NOTE — Progress Notes (Signed)
eLink Physician-Brief Progress Note Patient Name: Antonio Glenn DOB: [birth year] MRN: [id number]   Date of Service  02/13/2020  HPI/Events of Note  Patient is having agitated delirium. Already in multiple restraints but staff requesting addition of bilateral ankle soft restraints due to patient striking staff in his confusion. 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   eICU Interventions  Ordered additional bilateral ankle restraints to maintain the safety of both staff and the patient.     Intervention Category Minor Interventions: Agitation / anxiety - evaluation and management  Charlott Rakes 02/13/2020, 5:27 AM

## 2020-02-13 NOTE — Progress Notes (Signed)
NAME:  Antonio Glenn, MRN:  [id number], DOB:  08/03/1960, LOS: 2 ADMISSION DATE:  02/11/2020, CONSULTATION DATE:  02/11/20 REFERRING MD:  Viona Gilmore  CHIEF COMPLAINT:  AMS / Seizures   Brief History   Antonio Glenn is a 59 y.o. male with hx of recently diagnosed brain tumor who was transferred from [facility] ED to [facility] 11/25 with AMS and seizures.  He is scheduled for left craniotomy on Dec 1 with Dr. Marcello Moores.  Past Medical History  Brain Mass.  Significant Hospital Events   11/25 > admit.  Consults:  Neurosurgery  Procedures:  None.  Significant Diagnostic Tests:  CT head 11/25 > 6 - 7cm mass in left parietal lobe with areas of internal cystic necrosis.  Mild regional mass effect with no MLS.   EEG 11/25 >   Micro Data:  Flu 11/25 >  COVID 11/25 >   Antimicrobials:  None.   Interim history/subjective:  Patient remained very agitated requiring multiple medications including Haldol, Ativan and Precedex infusion, still is agitated, trying to pull IV lines and coming out of bed.  Objective:  Blood pressure (!) 178/85, pulse 71, temperature 99.3 F (37.4 C), temperature source Oral, resp. rate (!) 21, height 6\' 3"  (1.905 m), weight (!) 146.8 kg, SpO2 96 %.        Intake/Output Summary (Last 24 hours) at 02/13/2020 1420 Last data filed at 02/13/2020 0900 Gross per 24 hour  Intake 1663.17 ml  Output 290 ml  Net 1373.17 ml   Filed Weights   02/11/20 1820 02/13/20 0401  Weight: (!) 148.3 kg (!) 146.8 kg    Examination: General: Middle-aged morbidly obese male, lying on the bed, very agitated and restless Neuro: Opens eyes with vocal stimuli, has significant receptive and expressive aphasia, moving all 4 extremities spontaneously.  Pupils 4 mm bilateral reactive to light HEENT: [place]/AT. Sclerae anicteric.  Moist mucous membranes EOMI. Cardiovascular: RRR, no M/R/G.  Lungs: Respirations even and unlabored.  CTA bilaterally, No W/R/R.  Abdomen: BS x 4, soft, NT/ND.   Musculoskeletal: No gross deformities, no edema.  Skin: Intact, warm, no rashes.  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 Paroxysmal atrial fibrillation with rapid ventricular response Morbid obesity  Patient started getting agitated yesterday in the afternoon, he is pulling his IV lines, coming out of bed and walking in the hallway without any cloths He received IV Haldol, later on IV Ativan and was started on Precedex infusion, still was agitated and there was a risk of hurting himself, soft physical restraints were placed No more clinical seizures noted since he was admitted, EEG was negative for active seizures but he does have epileptogenic foci in left frontotemporal region Continue Keppra 500 mg twice daily Continue lorazepam as needed for seizure activity Neurosurgery follow-up is appreciated, patient is a scheduled for resection of left parietal mass on 12/1 by Dr. Marcello Moores Continue dexamethasone 4 mg every 6 hours Patient's heart rate is well controlled, he converted to sinus rhythm last night IV Cardizem infusion was stopped, currently on p.o. Cardizem 60 mg every 6 hours Nutritionist follow-up   Best Practice:  Diet: NPO.  Tube feeds Pain/Anxiety/Delirium protocol (if indicated): Precedex infusion VAP protocol (if indicated): N/A. DVT prophylaxis: SCD's / Heparin. GI prophylaxis: N/A. Glucose control: SSI. Mobility: Bedrest. Code Status: Full. Family Communication: Patient's daughter was updated Disposition: ICU.  Labs   CBC: Recent Labs  Lab 02/12/20  0052  WBC 11.0*  HGB 15.1  HCT 44.8  MCV 92.8  PLT 182   Basic Metabolic Panel: Recent Labs  Lab 02/12/20 0052  NA 134*  K 4.0  CL 103  CO2 20*  GLUCOSE 141*  BUN 13  CREATININE 0.76  CALCIUM 7.7*  MG 2.3  PHOS 2.5   GFR: Estimated Creatinine Clearance: 151.9 mL/min (by C-G formula  based on SCr of 0.76 mg/dL). Recent Labs  Lab 02/12/20 0052  WBC 11.0*   Liver Function Tests: No results for input(s): AST, ALT, ALKPHOS, BILITOT, PROT, ALBUMIN in the last 168 hours. No results for input(s): LIPASE, AMYLASE in the last 168 hours. No results for input(s): AMMONIA in the last 168 hours. ABG No results found for: PHART, PCO2ART, PO2ART, HCO3, TCO2, ACIDBASEDEF, O2SAT  Coagulation Profile: No results for input(s): INR, PROTIME in the last 168 hours. Cardiac Enzymes: No results for input(s): CKTOTAL, CKMB, CKMBINDEX, TROPONINI in the last 168 hours. HbA1C: Hgb A1c MFr Bld  Date/Time Value Ref Range Status  02/11/2020 08:11 PM 5.8 (H) 4.8 - 5.6 % Final    Comment:    (NOTE) Pre diabetes:          5.7%-6.4%  Diabetes:              >6.4%  Glycemic control for   <7.0% adults with diabetes    CBG: Recent Labs  Lab 02/12/20 1931 02/12/20 2316 02/13/20 0354 02/13/20 0906 02/13/20 1353  GLUCAP 153* 136* 140* 138* 140*    Review of Systems:   Unable to obtain as pt is encephalopathic.  Past medical history  Brain Mass.  Surgical History   Not available.  Social History   Not available.  Family history   His family history is not on file.   Allergies No Known Allergies   Home meds  Prior to Admission medications   Medication Sig Start Date End Date Taking? Authorizing Provider  levETIRAcetam (KEPPRA) 500 MG tablet Take 500 mg by mouth 2 (two) times daily. 01/26/20  Yes [provider]    Total critical care time: 40 minutes  Performed by: [place] care time was exclusive of separately billable procedures and treating other patients.   Critical care was necessary to treat or prevent imminent or life-threatening deterioration.   Critical care was time spent personally by me on the following activities: development of treatment plan with patient and/or surrogate as well as nursing, discussions with consultants,  evaluation of patient's response to treatment, examination of patient, obtaining history from patient or surrogate, ordering and performing treatments and interventions, ordering and review of laboratory studies, ordering and review of radiographic studies, pulse oximetry and re-evaluation of patient's condition.   Jacky Kindle MD Critical care physician [facility]  Pager: [phone] Mobile: [phone]

## 2020-02-13 NOTE — Progress Notes (Signed)
Remains quite agitated.  Has received Precedex, Haldol, Ativan.  I reviewed his CT scan and his MRI.  Remains on heparin.  On Decadron 4 every 6.  Opens eyes in regards of somewhat, garbled speech, seems to move everything.

## 2020-02-14 ENCOUNTER — Inpatient Hospital Stay: Payer: Self-pay

## 2020-02-14 DIAGNOSIS — Z452 Encounter for adjustment and management of vascular access device: Secondary | ICD-10-CM

## 2020-02-14 DIAGNOSIS — G9340 Encephalopathy, unspecified: Secondary | ICD-10-CM | POA: Diagnosis not present

## 2020-02-14 DIAGNOSIS — R569 Unspecified convulsions: Secondary | ICD-10-CM

## 2020-02-14 HISTORY — DX: Unspecified convulsions: R56.9

## 2020-02-14 HISTORY — DX: Encounter for adjustment and management of vascular access device: Z45.2

## 2020-02-14 LAB — GLUCOSE, CAPILLARY
Glucose-Capillary: 162 mg/dL — ABNORMAL HIGH (ref 70–99)
Glucose-Capillary: 146 mg/dL — ABNORMAL HIGH (ref 70–99)
Glucose-Capillary: 142 mg/dL — ABNORMAL HIGH (ref 70–99)
Glucose-Capillary: 151 mg/dL — ABNORMAL HIGH (ref 70–99)
Glucose-Capillary: 143 mg/dL — ABNORMAL HIGH (ref 70–99)
Glucose-Capillary: 150 mg/dL — ABNORMAL HIGH (ref 70–99)

## 2020-02-14 LAB — BLOOD GAS, VENOUS
Acid-Base Excess: 0.3 mmol/L (ref 0.0–2.0)
Bicarbonate: 24.1 mmol/L (ref 20.0–28.0)
FIO2: 28
O2 Saturation: 80.4 %
Patient temperature: 37
pCO2, Ven: 37.4 mmHg — ABNORMAL LOW (ref 44.0–60.0)
pH, Ven: 7.426 (ref 7.250–7.430)

## 2020-02-14 MED ORDER — LORAZEPAM 2 MG/ML IJ SOLN
2.0000 mg | Freq: Once | INTRAMUSCULAR | Status: AC
  Administered 2020-02-14: 4 mg via INTRAVENOUS

## 2020-02-14 MED ORDER — SODIUM CHLORIDE 0.9% FLUSH
10.0000 mL | Freq: Two times a day (BID) | INTRAVENOUS | Status: DC
  Administered 2020-02-14 – 2020-02-18 (×8): 10 mL

## 2020-02-14 MED ORDER — HALOPERIDOL LACTATE 5 MG/ML IJ SOLN
5.0000 mg | Freq: Once | INTRAMUSCULAR | Status: AC
  Administered 2020-02-14: 5 mg via INTRAVENOUS

## 2020-02-14 MED ORDER — LORAZEPAM 2 MG/ML IJ SOLN
2.0000 mg | Freq: Once | INTRAMUSCULAR | Status: AC
  Administered 2020-02-14: 2 mg via INTRAVENOUS

## 2020-02-14 MED ORDER — SODIUM CHLORIDE 0.9% FLUSH: 10.0000 mL | INTRAVENOUS | Status: DC | PRN

## 2020-02-14 MED ORDER — SODIUM CHLORIDE 0.9 % IV SOLN
80.0000 mg | Freq: Two times a day (BID) | INTRAVENOUS | Status: DC
  Administered 2020-02-14 (×2): 80 mg via INTRAVENOUS
  Filled 2020-02-14 (×5): qty 80

## 2020-02-14 MED ORDER — LORAZEPAM 2 MG/ML IJ SOLN
2.0000 mg | INTRAMUSCULAR | Status: DC | PRN
  Administered 2020-02-15 – 2020-02-18 (×17): 2 mg via INTRAVENOUS
  Filled 2020-02-14 (×20): qty 1

## 2020-02-14 MED ORDER — SODIUM CHLORIDE 0.9% FLUSH
10.0000 mL | Freq: Two times a day (BID) | INTRAVENOUS | Status: DC
  Administered 2020-02-14 – 2020-02-16 (×4): 10 mL
  Administered 2020-02-16 – 2020-02-17 (×2): 20 mL
  Administered 2020-02-17 – 2020-02-18 (×2): 10 mL

## 2020-02-14 MED ORDER — DEXAMETHASONE SODIUM PHOSPHATE 10 MG/ML IJ SOLN
10.0000 mg | Freq: Four times a day (QID) | INTRAMUSCULAR | Status: DC
  Administered 2020-02-14 – 2020-02-15 (×7): 10 mg via INTRAVENOUS
  Administered 2020-02-16: 4 mg via INTRAVENOUS
  Administered 2020-02-16: 10 mg via INTRAVENOUS
  Filled 2020-02-14 (×9): qty 1

## 2020-02-14 NOTE — Progress Notes (Signed)
Peripherally Inserted Central Catheter Placement  The IV Nurse has discussed with the patient and/or persons authorized to consent for the patient, the purpose of this procedure and the potential benefits and risks involved with this procedure.  The benefits include less needle sticks, lab draws from the catheter, and the patient may be discharged home with the catheter. Risks include, but not limited to, infection, bleeding, blood clot (thrombus formation), and puncture of an artery; nerve damage and irregular heartbeat and possibility to perform a PICC exchange if needed/ordered by physician.  Alternatives to this procedure were also discussed.  Bard Power PICC patient education guide, fact sheet on infection prevention and patient information card has been provided to patient /or left at bedside.    PICC Placement Documentation  PICC Double Lumen 55/37/48 PICC Left Basilic 46 cm 0 cm (Active)  Indication for Insertion or Continuance of Line Poor Vasculature-patient has had multiple peripheral attempts or PIVs lasting less than 24 hours 02/14/20 1500  Exposed Catheter (cm) 0 cm 02/14/20 1500  Site Assessment Clean;Dry;Intact 02/14/20 1500  Lumen #1 Status Flushed;Blood return noted 02/14/20 1500  Lumen #2 Status Flushed;Blood return noted 02/14/20 1500  Dressing Type Transparent 02/14/20 1500  Dressing Status Clean;Dry;Intact 02/14/20 1500  Antimicrobial disc in place? Yes 02/14/20 1500  Dressing Intervention New dressing 02/14/20 1500  Dressing Change Due 02/21/20 02/14/20 1500       Jule Economy Horton 02/14/2020, 3:31 PM

## 2020-02-14 NOTE — Progress Notes (Signed)
eLink Physician-Brief Progress Note Patient Name: Antonio Glenn DOB: Jun 29, 1960 MRN: [id number]   Date of Service  02/14/2020  HPI/Events of Note  Severe agitation. Patient is in 4 point restraints + soft waist belt restraint, and is receiving Haldol and Ativan IV in addition to Precedex at 1.5. Despite this, he is thrashing his body violently against the side rails of the bed with significant force.  Patient has a brain tumor and has had seizures this admission.   eICU Interventions  Difficult situation. Will increase Ativan PRN frequency to 2mg  q30 minutes PRN. Also ordered a 2-4mg  IV dose now for his acute agitation. Monitor respiratory status closely.     Intervention Category Minor Interventions: Agitation / anxiety - evaluation and management  Charlott Rakes 02/14/2020, 10:24 PM

## 2020-02-14 NOTE — Progress Notes (Signed)
NAME:  Antonio Glenn, MRN:  [id number], DOB:  25-Jun-1960, LOS: 3 ADMISSION DATE:  02/11/2020, CONSULTATION DATE:  02/11/20 REFERRING MD:  Antonio Glenn  CHIEF COMPLAINT:  AMS / Seizures   Brief History   Antonio Glenn is a 59 y.o. male with hx of recently diagnosed brain tumor who was transferred from [place] ED to [facility] 11/25 with AMS and seizures.  He is scheduled for left craniotomy on Dec 1 with Dr. Marcello Glenn.  Past Medical History  Brain Mass.  Significant Hospital Events   11/25 > admit.  Consults:  Neurosurgery  Procedures:  None.  Significant Diagnostic Tests:  CT head 11/25 > 6 - 7cm mass in left parietal lobe with areas of internal cystic necrosis.  Mild regional mass effect with no MLS.   EEG 11/25 >   Micro Data:  Flu 11/25 >  COVID 11/25 >   Antimicrobials:  None.   Interim history/subjective:  Patient is much more calm and cooperative today, he is on high-dose Precedex infusion at 1.5 mcg, holding further Haldol, required few doses of Ativan  Objective:  Blood pressure (!) 176/107, pulse 65, temperature 98.9 F (37.2 C), temperature source Oral, resp. rate 14, height 6\' 3"  (1.905 m), weight (!) 144.1 kg, SpO2 98 %.        Intake/Output Summary (Last 24 hours) at 02/14/2020 1125 Last data filed at 02/14/2020 0700 Gross per 24 hour  Intake 2598.72 ml  Output 3300 ml  Net -701.28 ml   Filed Weights   02/11/20 1820 02/13/20 0401 02/14/20 0400  Weight: (!) 148.3 kg (!) 146.8 kg (!) 144.1 kg    Examination: General: Middle-aged morbidly obese male, lying on the bed, very agitated and restless Neuro: Opens eyes with vocal stimuli, has significant receptive and expressive aphasia, moving all 4 extremities spontaneously.  Pupils 3 mm bilateral reactive to light HEENT: [place]/AT. Sclerae anicteric.  Moist mucous membranes EOMI. Cardiovascular: RRR, no M/R/G.  Lungs: Clear to auscultation bilaterally Abdomen: BS x 4, soft, NT/ND.  Musculoskeletal: No gross  deformities, no edema.  Skin: Intact, warm, no rashes.  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 Paroxysmal atrial fibrillation presented with rapid ventricular response, now in NSR Morbid obesity  Patient is much more calm and cooperative Currently on 1.5 mics of Precedex Holding further Dilaudid, continue as needed Ativan Appreciate neurosurgery recommendations Dexamethasone was increased to 10 mg every 6 hours  No more clinical seizures noted since he was admitted, EEG was negative for active seizures but he does have epileptogenic foci in left frontotemporal region Continue Keppra 500 mg twice daily Continue lorazepam as needed for seizure activity Patient will have tumor resection/craniotomy for a scheduled on 12/1 by Dr. Marcello Glenn Patient remained in sinus rhythm, continue Cardizem Holding anticoagulation for stroke prophylaxis due to scheduled brain surgery in few days   Best Practice:  Diet: NPO.  Tube feeds Pain/Anxiety/Delirium protocol (if indicated): Precedex infusion VAP protocol (if indicated): N/A. DVT prophylaxis: SCD's / Heparin. GI prophylaxis: N/A. Glucose control: SSI. Mobility: Bedrest. Code Status: Full. Family Communication: Patient's daughter was updated Disposition: ICU.  Labs   CBC: Recent Labs  Lab 02/12/20 0052  WBC 11.0*  HGB 15.1  HCT 44.8  MCV 92.8  PLT 858   Basic Metabolic Panel: Recent Labs  Lab 02/12/20 0052  NA 134*  K 4.0  CL 103  CO2 20*  GLUCOSE 141*  BUN  13  CREATININE 0.76  CALCIUM 7.7*  MG 2.3  PHOS 2.5   GFR: Estimated Creatinine Clearance: 150.4 mL/min (by C-G formula based on SCr of 0.76 mg/dL). Recent Labs  Lab 02/12/20 0052  WBC 11.0*   Liver Function Tests: No results for input(s): AST, ALT, ALKPHOS, BILITOT, PROT, ALBUMIN in the last 168 hours. No results for input(s): LIPASE, AMYLASE in  the last 168 hours. No results for input(s): AMMONIA in the last 168 hours. ABG No results found for: PHART, PCO2ART, PO2ART, HCO3, TCO2, ACIDBASEDEF, O2SAT  Coagulation Profile: No results for input(s): INR, PROTIME in the last 168 hours. Cardiac Enzymes: No results for input(s): CKTOTAL, CKMB, CKMBINDEX, TROPONINI in the last 168 hours. HbA1C: Hgb A1c MFr Bld  Date/Time Value Ref Range Status  02/11/2020 08:11 PM 5.8 (H) 4.8 - 5.6 % Final    Comment:    (NOTE) Pre diabetes:          5.7%-6.4%  Diabetes:              >6.4%  Glycemic control for   <7.0% adults with diabetes    CBG: Recent Labs  Lab 02/13/20 1927 02/13/20 2317 02/14/20 0319 02/14/20 0920 02/14/20 1123  GLUCAP 144* 155* 162* 146* 142*    Review of Systems:   Unable to obtain as pt is encephalopathic.  Past medical history  Brain Mass.  Surgical History   Not available.  Social History   Not available.  Family history   His family history is not on file.   Allergies No Known Allergies   Home meds  Prior to Admission medications   Medication Sig Start Date End Date Taking? Authorizing Provider  levETIRAcetam (KEPPRA) 500 MG tablet Take 500 mg by mouth 2 (two) times daily. 01/26/20  Yes [provider]    Total critical care time: 35 minutes  Performed by: [place] care time was exclusive of separately billable procedures and treating other patients.   Critical care was necessary to treat or prevent imminent or life-threatening deterioration.   Critical care was time spent personally by me on the following activities: development of treatment plan with patient and/or surrogate as well as nursing, discussions with consultants, evaluation of patient's response to treatment, examination of patient, obtaining history from patient or surrogate, ordering and performing treatments and interventions, ordering and review of laboratory studies, ordering and review of  radiographic studies, pulse oximetry and re-evaluation of patient's condition.   Antonio Kindle MD Critical care physician [facility]  Pager: [phone] Mobile: [phone]

## 2020-02-14 NOTE — Progress Notes (Signed)
Subjective: Patient reports Somnolent difficult to arouse status post Ativan Haldol and Precedex  Objective: Vital signs in last 24 hours: Temp:  [98.5 F (36.9 C)-99.3 F (37.4 C)] 98.9 F (37.2 C) (11/28 0400) Pulse Rate:  [60-77] 69 (11/28 0700) Resp:  [15-22] 17 (11/28 0700) BP: (152-205)/(68-110) 162/92 (11/28 0700) SpO2:  [94 %-100 %] 100 % (11/28 0700) Weight:  [144.1 kg] 144.1 kg (11/28 0400)  Intake/Output from previous day: 11/27 0701 - 11/28 0700 In: 2743.3 [I.V.:2543.3; IV Piggyback:200] Out: 3300 [Urine:3300] Intake/Output this shift: No intake/output data recorded.  Reportedly patient very agitated requiring increasing amounts of sedation however now appears to have caught up with him.  Patient's vital signs are stable maintaining his saturations but very difficult to arouse.  Lab Results: Recent Labs    02/12/20 0052  WBC 11.0*  HGB 15.1  HCT 44.8  PLT 266   BMET Recent Labs    02/12/20 0052  NA 134*  K 4.0  CL 103  CO2 20*  GLUCOSE 141*  BUN 13  CREATININE 0.76  CALCIUM 7.7*    Studies/Results: EEG  Result Date: 02/12/2020 Lora Havens, MD     02/12/2020 10:23 AM Patient Name: Antonio Glenn MRN: [id number] Epilepsy Attending: Lora Havens Referring Physician/Provider: Montey Hora, PA Date: 02/12/2020 Duration: 24.04 minutes Patient history: 59 year old male found to have left parietal brain mass concerning for neoplasm was noted to have seizure-like activity.  EEG to evaluate for seizures. Level of alertness: Awake, drowsy, sleep, comatose, lethargic AEDs during EEG study: Keppra Technical aspects: This EEG study was done with scalp electrodes positioned according to the 10-20 International system of electrode placement. Electrical activity was acquired at a sampling rate of 500Hz  and reviewed with a high frequency filter of 70Hz  and a low frequency filter of 1Hz . EEG data were recorded continuously and digitally stored. Description: The  posterior dominant rhythm consists of 8-9 Hz activity of moderate voltage (25-35 uV) seen predominantly in posterior head regions, asymmetric ( L<R) and reactive to eye opening and eye closing.  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  Continuous 3-5Hz  theta-delta slowing was also noted in left hemisphere, maximal left temporal region.  Hyperventilation and photic stimulation were not performed.   ABNORMALITY - Periodic epileptiform discharges, left frontotemporal region - Continuous slow, left hemisphere, maximal left temporal region. IMPRESSION: This study showed evidence of epileptogenicity arising from left frontotemporal as well as cortical dysfunction in left hemisphere, maximal left temporal region region due to underlying mass.  No seizures were seen throughout the recording. Lora Havens   CT HEAD W & WO CONTRAST  Result Date: 02/12/2020 CLINICAL DATA:  Brain/CNS lesion. Mental status change with seizures. EXAM: CT HEAD WITHOUT AND WITH CONTRAST TECHNIQUE: Contiguous axial images were obtained from the base of the skull through the vertex without and with intravenous contrast CONTRAST:  160mL OMNIPAQUE IOHEXOL 300 MG/ML  SOLN COMPARISON:  MRI January 30, 2020. FINDINGS: Brain: No evidence of acute infarction, hemorrhage, hydrocephalus, extra-axial collection. In comparison to recent MRI, no substantial change in the complex solid and cystic mass in the left parietal lobe, which is better characterized on the MRI. No progressive mass effect. No midline shift. No evidence of acute hemorrhage. No hydrocephalus. No acute large vascular territory infarct. Vascular: Calcific atherosclerosis. Skull: No acute fracture. Sinuses/Orbits: No acute findings. Other: No mastoid effusions. 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 No progressive mass  effect or evidence of superimposed acute intracranial abnormality. Repeat MRI with contrast could provide more sensitive evaluation for progression if clinically indicated. Electronically Signed   By: Margaretha Sheffield MD   On: 02/12/2020 14:31    Assessment/Plan: Continue to allow Ativan and Haldol to get out of his system allow the patient to wake up more CT scan yesterday shows no significant change in tumor do not plan on doing emergent surgery plan on scheduled resection on Wednesday per Dr. Marcello Moores.  We will increase his Decadron  LOS: 3 days     Elaina Hoops 02/14/2020, 8:35 AM

## 2020-02-14 NOTE — Progress Notes (Addendum)
Patient consistent with Agitation even with ordered Haldol and Ativan. [place] nurse contacted to communicate needs for change of orders. Ativan orders increased per Dr. Gillermina Phy orders.   Hart Rochester, RN

## 2020-02-14 NOTE — Progress Notes (Signed)
In midst of PICC insertion, pt became extremely agitated.  Required 4-5 staff nurses to assist with holding during the remainder of placement along with 2 PICC RN's.  Sterility maintained at site. Bruising at site due to thrashing in the bed as well as prior bruises noted generally.

## 2020-02-15 ENCOUNTER — Inpatient Hospital Stay (HOSPITAL_COMMUNITY): Payer: BC Managed Care – PPO

## 2020-02-15 ENCOUNTER — Other Ambulatory Visit (HOSPITAL_COMMUNITY): Payer: BC Managed Care – PPO

## 2020-02-15 ENCOUNTER — Encounter (HOSPITAL_COMMUNITY): Payer: Self-pay

## 2020-02-15 ENCOUNTER — Inpatient Hospital Stay (HOSPITAL_COMMUNITY): Admission: RE | Admit: 2020-02-15 | Payer: BC Managed Care – PPO | Source: Ambulatory Visit

## 2020-02-15 DIAGNOSIS — R569 Unspecified convulsions: Secondary | ICD-10-CM | POA: Diagnosis not present

## 2020-02-15 DIAGNOSIS — G9340 Encephalopathy, unspecified: Secondary | ICD-10-CM | POA: Diagnosis not present

## 2020-02-15 HISTORY — DX: Neoplasm of unspecified behavior of brain: D49.6

## 2020-02-15 LAB — GLUCOSE, CAPILLARY
Glucose-Capillary: 168 mg/dL — ABNORMAL HIGH (ref 70–99)
Glucose-Capillary: 167 mg/dL — ABNORMAL HIGH (ref 70–99)
Glucose-Capillary: 132 mg/dL — ABNORMAL HIGH (ref 70–99)
Glucose-Capillary: 161 mg/dL — ABNORMAL HIGH (ref 70–99)
Glucose-Capillary: 150 mg/dL — ABNORMAL HIGH (ref 70–99)
Glucose-Capillary: 158 mg/dL — ABNORMAL HIGH (ref 70–99)

## 2020-02-15 LAB — PHOSPHORUS: Phosphorus: 2.7 mg/dL (ref 2.5–4.6)

## 2020-02-15 LAB — MAGNESIUM: Magnesium: 2.3 mg/dL (ref 1.7–2.4)

## 2020-02-15 MED ORDER — DOCUSATE SODIUM 50 MG/5ML PO LIQD: 100.0000 mg | Freq: Two times a day (BID) | ORAL | Status: DC | PRN

## 2020-02-15 MED ORDER — PROSOURCE TF PO LIQD
45.0000 mL | Freq: Three times a day (TID) | ORAL | Status: DC
  Administered 2020-02-15 – 2020-02-18 (×4): 45 mL
  Filled 2020-02-15 (×3): qty 45

## 2020-02-15 MED ORDER — OSMOLITE 1.5 CAL PO LIQD
1000.0000 mL | ORAL | Status: DC
  Administered 2020-02-15 – 2020-02-17 (×2): 1000 mL
  Filled 2020-02-15: qty 1000

## 2020-02-15 MED ORDER — ZIPRASIDONE MESYLATE 20 MG IM SOLR
10.0000 mg | Freq: Once | INTRAMUSCULAR | Status: AC
  Administered 2020-02-15: 10 mg via INTRAMUSCULAR
  Filled 2020-02-15: qty 20

## 2020-02-15 MED ORDER — VANCOMYCIN HCL 1500 MG/300ML IV SOLN
1500.0000 mg | INTRAVENOUS | Status: AC
  Administered 2020-02-17: 1500 mg via INTRAVENOUS
  Filled 2020-02-15: qty 300

## 2020-02-15 MED ORDER — PANTOPRAZOLE SODIUM 40 MG IV SOLR
40.0000 mg | Freq: Every day | INTRAVENOUS | Status: DC
  Administered 2020-02-15 – 2020-02-16 (×2): 40 mg via INTRAVENOUS
  Filled 2020-02-15 (×2): qty 40

## 2020-02-15 MED ORDER — POLYETHYLENE GLYCOL 3350 17 G PO PACK: 17.0000 g | PACK | Freq: Every day | ORAL | Status: DC | PRN

## 2020-02-15 MED ORDER — DILTIAZEM HCL 60 MG PO TABS
60.0000 mg | ORAL_TABLET | Freq: Four times a day (QID) | ORAL | Status: DC
  Administered 2020-02-17 – 2020-02-18 (×4): 60 mg
  Filled 2020-02-15 (×13): qty 1

## 2020-02-15 MED ORDER — PHENOBARBITAL SODIUM 130 MG/ML IJ SOLN
200.0000 mg | Freq: Once | INTRAMUSCULAR | Status: AC
  Administered 2020-02-15: 200 mg via INTRAVENOUS
  Filled 2020-02-15: qty 1.54

## 2020-02-15 MED ORDER — PHENOBARBITAL SODIUM 65 MG/ML IJ SOLN
65.0000 mg | Freq: Once | INTRAMUSCULAR | Status: AC
  Administered 2020-02-15: 65 mg via INTRAVENOUS
  Filled 2020-02-15: qty 1

## 2020-02-15 NOTE — Progress Notes (Signed)
NAME:  Antonio Glenn, MRN:  [id number], DOB:  30-Jan-1961, LOS: 4 ADMISSION DATE:  02/11/2020, CONSULTATION DATE:  02/11/20 REFERRING MD:  Viona Gilmore  CHIEF COMPLAINT:  AMS / Seizures   Brief History   Antonio Glenn is a 59 y.o. male with hx of recently diagnosed brain tumor who was transferred from [place] ED to [facility] 11/25 with AMS and seizures.  He is scheduled for left craniotomy on Dec 1 with Dr. Marcello Moores.  Past Medical History  Brain Mass.  Significant Hospital Events   11/25 > admit.  Consults:  Neurosurgery  Procedures:  None.  Significant Diagnostic Tests:  CT head 11/25 > 6 - 7cm mass in left parietal lobe with areas of internal cystic necrosis.  Mild regional mass effect with no MLS.   EEG 11/25 >   Micro Data:  Flu 11/25 >  COVID 11/25 >   Antimicrobials:  None.   Interim history/subjective:  Patient is much more calm and cooperative today, he is on high-dose Precedex infusion at 1.5 mcg, holding further Haldol, required few doses of Ativan  Objective:  Blood pressure (!) 160/90, pulse (!) 118, temperature 97.6 F (36.4 C), temperature source Axillary, resp. rate 20, height 6\' 3"  (1.905 m), weight (!) 142.6 kg, SpO2 95 %.        Intake/Output Summary (Last 24 hours) at 02/15/2020 1012 Last data filed at 02/15/2020 0911 Gross per 24 hour  Intake 2828.25 ml  Output 3650 ml  Net -821.75 ml   Filed Weights   02/13/20 0401 02/14/20 0400 02/15/20 0500  Weight: (!) 146.8 kg (!) 144.1 kg (!) 142.6 kg    Examination: General: Middle-aged morbidly obese male, lying on the bed, very agitated and restless Neuro: Opens eyes with vocal stimuli, has significant receptive and expressive aphasia, moving all 4 extremities spontaneously.  Pupils 3 mm bilateral reactive to light HEENT: [place]/AT. Sclerae anicteric.  Moist mucous membranes EOMI. Cardiovascular: RRR, no M/R/G.  Lungs: Clear to auscultation bilaterally Abdomen: BS x 4, soft, NT/ND.  Musculoskeletal: No gross  deformities, no edema.  Skin: Intact, warm, no rashes.  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 -We will try Geodon -Continue dexamethasone 10 mg every 6 hours  No more clinical seizures noted since he was admitted, EEG was negative for active seizures but he does have epileptogenic foci in left frontotemporal region -Continue Keppra 500 mg twice daily -Continue lorazepam as needed for seizure activity Patient will have tumor resection/craniotomy for a scheduled on 12/1 by Dr. Marcello Moores Patient remained in sinus rhythm, continue Cardizem Holding anticoagulation for stroke prophylaxis due to scheduled brain surgery in few days   Daily Goals Checklist  Pain/Anxiety/Delirium protocol (if indicated): Trial of Geodon, continue Precedex Neuro vitals: every 4 hours AED's: Keppra 500 twice daily VAP protocol (if indicated): Not intubated Respiratory support goals: Encourage cough and deep breathing Blood pressure target: Keep systolic blood pressure less than 170 DVT prophylaxis: Mechanical prophylaxis Nutrition Status: Low nutritional risk GI prophylaxis: Pantoprazole 40 mg daily Fluid status goals: Allow autoregulation Urinary catheter: Condom catheter Central lines: None Glucose control: Phase 1 glycemic  control Mobility/therapy needs: Bedrest, continues to require restraints for patient safety Antibiotic de-escalation: On no antibiotic Home medication reconciliation: On hold Daily labs: CBC, BMP Code Status: Full Family Communication:  We will update family Disposition: ICU   Labs   CBC: Recent Labs  Lab 02/12/20 0052  WBC 11.0*  HGB 15.1  HCT 44.8  MCV 92.8  PLT 563   Basic Metabolic Panel: Recent Labs  Lab 02/12/20 0052  NA 134*  K 4.0  CL 103  CO2 20*  GLUCOSE 141*  BUN 13  CREATININE 0.76  CALCIUM 7.7*  MG 2.3  PHOS 2.5   GFR: Estimated Creatinine Clearance: 149.6 mL/min (by C-G formula based on SCr of 0.76 mg/dL). Recent Labs  Lab 02/12/20 0052  WBC 11.0*   Liver Function Tests: No results for input(s): AST, ALT, ALKPHOS, BILITOT, PROT, ALBUMIN in the last 168 hours. No results for input(s): LIPASE, AMYLASE in the last 168 hours. No results for input(s): AMMONIA in the last 168 hours. ABG    Component Value Date/Time   HCO3 24.1 02/14/2020 1627   O2SAT 80.4 02/14/2020 1627    Coagulation Profile: No results for input(s): INR, PROTIME in the last 168 hours. Cardiac Enzymes: No results for input(s): CKTOTAL, CKMB, CKMBINDEX, TROPONINI in the last 168 hours. HbA1C: Hgb A1c MFr Bld  Date/Time Value Ref Range Status  02/11/2020 08:11 PM 5.8 (H) 4.8 - 5.6 % Final    Comment:    (NOTE) Pre diabetes:          5.7%-6.4%  Diabetes:              >6.4%  Glycemic control for   <7.0% adults with diabetes    CBG: Recent Labs  Lab 02/14/20 1627 02/14/20 1945 02/14/20 2319 02/15/20 0322 02/15/20 0737  GLUCAP 151* 143* 150* 168* 167*    CRITICAL CARE Performed by: Kipp Brood   Total critical care time: 35 minutes  Critical care time was exclusive of separately billable procedures and treating other patients.  Critical care was necessary to treat or prevent imminent or life-threatening deterioration.  Critical care was time spent personally by me on the following activities: development of treatment plan with patient and/or surrogate as well as nursing, discussions with consultants, evaluation of patient's response to treatment, examination of patient, obtaining history  from patient or surrogate, ordering and performing treatments and interventions, ordering and review of laboratory studies, ordering and review of radiographic studies, pulse oximetry, re-evaluation of patient's condition and participation in multidisciplinary rounds.  Kipp Brood, MD [facility] ICU Physician [place]  Pager: [phone] Mobile: [phone] After hours: [phone].

## 2020-02-15 NOTE — Progress Notes (Signed)
Subjective: By nursing report, patient less agitated compared with earlier in morning.  Objective: Vital signs in last 24 hours: Temp:  [97.6 F (36.4 C)-98.9 F (37.2 C)] 97.6 F (36.4 C) (11/29 0800) Pulse Rate:  [53-118] 62 (11/29 1100) Resp:  [11-23] 19 (11/29 1100) BP: (122-225)/(54-90) 171/88 (11/29 1100) SpO2:  [90 %-100 %] 99 % (11/29 1100) Weight:  [142.6 kg] 142.6 kg (11/29 0500)  Intake/Output from previous day: 11/28 0701 - 11/29 0700 In: 2818.3 [I.V.:2418.3; IV Piggyback:400] Out: 3650 [Urine:3650] Intake/Output this shift: Total I/O In: 10 [I.V.:10] Out: -     General : Obese, appears stated age, some agitation   Head:  [place]/AT   Eyes: PERRL, conjunctiva/corneas clear, EOM's intact. Fundi could not be visualized. Neck: Supple Chest:  Respirations unlabored Chest wall: no tenderness or deformity Heart: Regular rate and rhythm Abdomen: Soft, nontender and nondistended Extremities: warm and well-perfused Skin: normal turgor, color and texture Neurologic:  Oriented to person only.  Spontaneous nonsensical speech, poor attention.  Eyes open spontaneously. PERRL, EOMI, VFC, no facial droop. + dysarthria.  CNII-XII intact. Normal strength, sensation and reflexes throughout.         Lab Results: No results for input(s): WBC, HGB, HCT, PLT in the last 72 hours. BMET No results for input(s): NA, K, CL, CO2, GLUCOSE, BUN, CREATININE, CALCIUM in the last 72 hours.  Studies/Results: [place] EKG SITE RITE  Result Date: 02/14/2020 If Site Rite image not attached, placement could not be confirmed due to current cardiac rhythm.   Assessment/Plan: 59 yo M with left parietal brain tumor, recent seizure who has improving but persistent non-focal delirium/encephalopathy. - would recommend repeat EEG to r/o nonconvulsive SE, given his last EEG was 11/26 - decrease dexamethasone to 4 mg q6, though I would be surprised 10 mg would cause this degree of delirium - planning  craniotomy for resection on Wednesday morning - will obtain CT head with Stealth protocol for operative planning tomorrow morning  [name] 02/15/2020, 12:42 PM

## 2020-02-15 NOTE — Progress Notes (Signed)
Initial Nutrition Assessment  DOCUMENTATION CODES:   Obesity unspecified  INTERVENTION:   Initiate tube feeds via Cortrak: - Osmolite 1.5 @ 60 ml/hr (1440 ml/day) - ProSource TF 45 ml TID  Tube feeding regimen provides 2280 kcal, 123 grams of protein, and 1097 ml of H2O.   NUTRITION DIAGNOSIS:   Inadequate oral intake related to lethargy/confusion as evidenced by NPO status.  GOAL:   Patient will meet greater than or equal to 90% of their needs  MONITOR:   Diet advancement, Labs, Weight trends, TF tolerance, I & O's  REASON FOR ASSESSMENT:   Consult Enteral/tube feeding initiation and management  ASSESSMENT:   59 year old male who presented on 11/25 with seizure-like activity. Pt was recently found to have brain mass concerning for neoplasm with plan for left craniotomy on 12/01.   11/29 - Cortrak placed, tip gastric  Consult received for TF initiation and management. Cortrak placed this morning by Cortrak team. Pt has been agitated on and off per review of notes.  Unable to obtain diet and weight history at this time. Pt asleep at time of RD visit, sedated on precedex. No weight history in chart PTA.  Admit weight: 148.3 kg Current weight: 142.6 kg  Medications reviewed and include: IV decadron, SSI q 4 hours, IV protonix, precedex IVF: LR @ 50 ml/hr  Labs reviewed: sodium 134 CBG's: 132-168 x 24 hours  UOP: 3650 ml x 24 hours I/O's: -521 ml since admit  NUTRITION - FOCUSED PHYSICAL EXAM:    Most Recent Value  Orbital Region No depletion  Upper Arm Region No depletion  Thoracic and Lumbar Region No depletion  Buccal Region No depletion  Temple Region No depletion  Clavicle Bone Region No depletion  Clavicle and Acromion Bone Region No depletion  Scapular Bone Region Unable to assess  Dorsal Hand No depletion  Patellar Region No depletion  Anterior Thigh Region No depletion  Posterior Calf Region No depletion  Edema (RD Assessment) None  Hair  Reviewed  Eyes Unable to assess  Mouth Reviewed  Skin Reviewed  Nails Reviewed       Diet Order:   Diet Order            Diet NPO time specified  Diet effective midnight           Diet NPO time specified Except for: Sips with Meds  Diet effective now                 EDUCATION NEEDS:   No education needs have been identified at this time  Skin:  Skin Assessment: Reviewed RN Assessment  Last BM:  no documented BM  Height:   Ht Readings from Last 1 Encounters:  02/11/20 6\' 3"  (1.905 m)    Weight:   Wt Readings from Last 1 Encounters:  02/15/20 (!) 142.6 kg    BMI:  Body mass index is 39.29 kg/m.  Estimated Nutritional Needs:   Kcal:  4492-0100  Protein:  120-140 grams  Fluid:  >/= 2.0 L    Gustavus Bryant, MS, RD, LDN Inpatient Clinical Dietitian Please see AMiON for contact information.

## 2020-02-15 NOTE — Procedures (Signed)
Patient Name: Antonio Glenn  MRN: [id number]  Epilepsy Attending: Lora Havens  Referring Physician/Provider: Dr Duffy Rhody Date: 02/15/2020 Duration: 23.29 mins  Patient history: 59 yo M with left parietal brain tumor, recent seizure who has improving but persistent non-focal delirium/encephalopathy. EEG to evaluate for seizure  Level of alertness: Awake, asleep  AEDs during EEG study: LEV  Technical aspects: This EEG study was done with scalp electrodes positioned according to the 10-20 International system of electrode placement. Electrical activity was acquired at a sampling rate of 500Hz  and reviewed with a high frequency filter of 70Hz  and a low frequency filter of 1Hz . EEG data were recorded continuously and digitally stored.   Description: No posterior dominant rhythm was seen. Sleep was characterized by vertex waves, sleep spindles (12 to 14 Hz), maximal frontocentral region. Sharp waves were noted in left temporal region. Continuous 3-5Hz  theta-delta slowing was also noted in left hemisphere, maximal left temporal region.  Hyperventilation and photic stimulation were not performed.     ABNORMALITY - Sharp waves,  left temporal region - Continuous slow, left hemisphere, maximal left temporal region.  IMPRESSION: This study showed evidence of epileptogenicity arising from left temporal region as well as cortical dysfunction in left hemisphere, maximal left temporal region region due to underlying mass.  No seizures were seen throughout the recording.  If concern for seizures persist, please consider long term monitoring.  Daje Stark Barbra Sarks

## 2020-02-15 NOTE — Progress Notes (Signed)
EEG complete - results pending 

## 2020-02-15 NOTE — Procedures (Signed)
Cortrak  Person Inserting Tube:  Dorin Stooksbury C, RD Tube Type:  Cortrak - 43 inches Tube Location:  Left nare Initial Placement:  Stomach Secured by: Bridle Technique Used to Measure Tube Placement:  Documented cm marking at nare/ corner of mouth Cortrak Secured At:  67 cm    Cortrak Tube Team Note:  Consult received to place a Cortrak feeding tube.   No x-ray is required. RN may begin using tube.   If the tube becomes dislodged please keep the tube and contact the Cortrak team at [email or web address] ([email or web address]) for replacement.  If after hours and replacement cannot be delayed, place a NG tube and confirm placement with an abdominal x-ray.    Mesiah Manzo P., RD, LDN, CNSC See AMiON for contact information    

## 2020-02-16 ENCOUNTER — Inpatient Hospital Stay (HOSPITAL_COMMUNITY): Payer: BC Managed Care – PPO

## 2020-02-16 DIAGNOSIS — G9389 Other specified disorders of brain: Secondary | ICD-10-CM

## 2020-02-16 DIAGNOSIS — R569 Unspecified convulsions: Secondary | ICD-10-CM | POA: Diagnosis not present

## 2020-02-16 DIAGNOSIS — G9340 Encephalopathy, unspecified: Secondary | ICD-10-CM | POA: Diagnosis not present

## 2020-02-16 LAB — CBC WITH DIFFERENTIAL/PLATELET
Abs Immature Granulocytes: 0.05 10*3/uL (ref 0.00–0.07)
Basophils Absolute: 0 10*3/uL (ref 0.0–0.1)
Basophils Relative: 0 %
Eosinophils Absolute: 0 10*3/uL (ref 0.0–0.5)
Eosinophils Relative: 0 %
HCT: 48.8 % (ref 39.0–52.0)
Hemoglobin: 15.8 g/dL (ref 13.0–17.0)
Immature Granulocytes: 1 %
Lymphocytes Relative: 5 %
Lymphs Abs: 0.5 10*3/uL — ABNORMAL LOW (ref 0.7–4.0)
MCH: 30 pg (ref 26.0–34.0)
MCHC: 32.4 g/dL (ref 30.0–36.0)
MCV: 92.8 fL (ref 80.0–100.0)
Monocytes Absolute: 0.6 10*3/uL (ref 0.1–1.0)
Monocytes Relative: 7 %
Neutro Abs: 8.1 10*3/uL — ABNORMAL HIGH (ref 1.7–7.7)
Neutrophils Relative %: 87 %
Platelets: 238 10*3/uL (ref 150–400)
RBC: 5.26 MIL/uL (ref 4.22–5.81)
RDW: 13.3 % (ref 11.5–15.5)
WBC: 9.2 10*3/uL (ref 4.0–10.5)
nRBC: 0 % (ref 0.0–0.2)

## 2020-02-16 LAB — GLUCOSE, CAPILLARY
Glucose-Capillary: 155 mg/dL — ABNORMAL HIGH (ref 70–99)
Glucose-Capillary: 160 mg/dL — ABNORMAL HIGH (ref 70–99)
Glucose-Capillary: 160 mg/dL — ABNORMAL HIGH (ref 70–99)
Glucose-Capillary: 135 mg/dL — ABNORMAL HIGH (ref 70–99)
Glucose-Capillary: 127 mg/dL — ABNORMAL HIGH (ref 70–99)
Glucose-Capillary: 120 mg/dL — ABNORMAL HIGH (ref 70–99)

## 2020-02-16 LAB — BASIC METABOLIC PANEL
Anion gap: 6 (ref 5–15)
BUN: 19 mg/dL (ref 6–20)
CO2: 22 mmol/L (ref 22–32)
Calcium: 6.9 mg/dL — ABNORMAL LOW (ref 8.9–10.3)
Chloride: 107 mmol/L (ref 98–111)
Creatinine, Ser: 0.54 mg/dL — ABNORMAL LOW (ref 0.61–1.24)
GFR, Estimated: >60 mL/min (ref >60)
Glucose, Bld: 151 mg/dL — ABNORMAL HIGH (ref 70–99)
Potassium: 3.3 mmol/L — ABNORMAL LOW (ref 3.5–5.1)
Sodium: 135 mmol/L (ref 135–145)

## 2020-02-16 LAB — MAGNESIUM
Magnesium: 2 mg/dL (ref 1.7–2.4)
Magnesium: 2.3 mg/dL (ref 1.7–2.4)

## 2020-02-16 LAB — SURGICAL PCR SCREEN
MRSA, PCR: POSITIVE — AB
Staphylococcus aureus: POSITIVE — AB

## 2020-02-16 LAB — PHOSPHORUS
Phosphorus: 2.5 mg/dL (ref 2.5–4.6)
Phosphorus: 2.7 mg/dL (ref 2.5–4.6)

## 2020-02-16 MED ORDER — PHENOBARBITAL SODIUM 130 MG/ML IJ SOLN
130.0000 mg | Freq: Every day | INTRAMUSCULAR | Status: AC
  Administered 2020-02-16 – 2020-02-18 (×3): 130 mg via INTRAVENOUS
  Filled 2020-02-16 (×4): qty 1

## 2020-02-16 MED ORDER — PANTOPRAZOLE SODIUM 40 MG PO PACK: 40.0000 mg | PACK | Freq: Every day | ORAL | Status: DC

## 2020-02-16 MED ORDER — DEXAMETHASONE SODIUM PHOSPHATE 4 MG/ML IJ SOLN
4.0000 mg | Freq: Four times a day (QID) | INTRAMUSCULAR | Status: DC
  Administered 2020-02-16 – 2020-02-19 (×9): 4 mg via INTRAVENOUS
  Filled 2020-02-16 (×9): qty 1

## 2020-02-16 MED ORDER — LORAZEPAM 2 MG/ML IJ SOLN: 2.0000 mg | Freq: Once | INTRAMUSCULAR | Status: DC

## 2020-02-16 MED ORDER — SODIUM CHLORIDE 0.9 % IV SOLN
100.0000 mg | Freq: Two times a day (BID) | INTRAVENOUS | Status: DC
  Administered 2020-02-16 – 2020-03-04 (×35): 100 mg via INTRAVENOUS
  Filled 2020-02-16 (×41): qty 10

## 2020-02-16 MED ORDER — PHENOBARBITAL SODIUM 65 MG/ML IJ SOLN
200.0000 mg | Freq: Once | INTRAMUSCULAR | Status: AC
  Administered 2020-02-17: 200 mg via INTRAVENOUS
  Filled 2020-02-16: qty 4

## 2020-02-16 MED ORDER — CHLORHEXIDINE GLUCONATE CLOTH 2 % EX PADS
6.0000 | MEDICATED_PAD | Freq: Every day | CUTANEOUS | Status: DC
  Administered 2020-02-16 – 2020-02-18 (×4): 6 via TOPICAL

## 2020-02-16 MED ORDER — POTASSIUM CHLORIDE 10 MEQ/50ML IV SOLN
10.0000 meq | INTRAVENOUS | Status: AC
  Administered 2020-02-16 (×3): 10 meq via INTRAVENOUS
  Filled 2020-02-16 (×2): qty 50

## 2020-02-16 MED ORDER — PANTOPRAZOLE SODIUM 40 MG IV SOLR
40.0000 mg | Freq: Every day | INTRAVENOUS | Status: DC
  Administered 2020-02-17: 40 mg via INTRAVENOUS
  Filled 2020-02-16: qty 40

## 2020-02-16 NOTE — Progress Notes (Signed)
NAME:  Antonio Glenn, MRN:  [id number], DOB:  1960-12-14, LOS: 5 ADMISSION DATE:  02/11/2020, CONSULTATION DATE:  02/11/20 REFERRING MD:  Viona Gilmore  CHIEF COMPLAINT:  AMS / Seizures   Brief History   Antonio Glenn is a 59 y.o. male with hx of recently diagnosed brain tumor who was transferred from [place] ED to [facility] 11/25 with AMS and seizures.  He is scheduled for left craniotomy on Dec 1 with Dr. Marcello Moores.  Past Medical History  Brain Mass.  Significant Hospital Events   11/25 > admit.  Consults:  Neurosurgery  Procedures:  None.  Significant Diagnostic Tests:  CT head 11/25 > 6 - 7cm mass in left parietal lobe with areas of internal cystic necrosis.  Mild regional mass effect with no MLS.   EEG 11/25 >   Micro Data:  Flu 11/25 >  COVID 11/25 >   Antimicrobials:  None.   Interim history/subjective:  Planes of severe agitation yesterday breaking through restraints.  Several load during doses of phenobarbital were given.  He has been relatively calmer since then.  Objective:  Blood pressure (!) 149/79, pulse (!) 52, temperature 98.4 F (36.9 C), temperature source Axillary, resp. rate (!) 7, height 6\' 3"  (1.905 m), weight (!) 142.1 kg, SpO2 97 %.        Intake/Output Summary (Last 24 hours) at 02/16/2020 1415 Last data filed at 02/16/2020 1200 Gross per 24 hour  Intake 3235.17 ml  Output 400 ml  Net 2835.17 ml   Filed Weights   02/14/20 0400 02/15/20 0500 02/16/20 0500  Weight: (!) 144.1 kg (!) 142.6 kg (!) 142.1 kg    Examination: General: Middle-aged morbidly obese male, lying on the bed, in restraints,  Neuro: Opens eyes with vocal stimuli, yells out but does not follow commands, moving all 4 extremities spontaneously.  Pupils 3 mm bilateral reactive to light HEENT: [place]/AT. Sclerae anicteric.  Moist mucous membranes EOMI. Cardiovascular: RRR, no M/R/G.  Lungs: Clear to auscultation bilaterally Abdomen: BS x 4, soft, NT/ND.  Musculoskeletal: No gross  deformities, no edema.  Skin: Intact, warm, no rashes.  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  Unclear whether mass is sole cause of agitation.  Possible component of withdrawal. -Scheduled phenobarbital. -Continue dexamethasone 10 mg every 6 hours  No more clinical seizures noted since he was admitted, EEG was negative for active seizures but he does have epileptogenic foci in left frontotemporal region -Continue Keppra 500 mg twice daily -Continue lorazepam as needed for seizure activity Patient will have tumor resection/craniotomy for a scheduled on 12/1 by Dr. Marcello Moores Patient remained in sinus rhythm, continue Cardizem Holding anticoagulation for stroke prophylaxis due to scheduled brain surgery in few days   Daily Goals Checklist  Pain/Anxiety/Delirium protocol (if indicated): Trial of Geodon, continue Precedex Neuro vitals: every 4 hours AED's: Keppra 500 twice daily VAP protocol (if indicated): Not intubated Respiratory support goals: Encourage cough and deep breathing Blood pressure target: Keep systolic blood pressure less than 170 DVT prophylaxis: Mechanical prophylaxis Nutrition Status: Low nutritional risk we will place new core track tomorrow. GI prophylaxis: Pantoprazole  40 mg daily Fluid status goals: Allow autoregulation Urinary catheter: Condom catheter Central lines: None Glucose control: Phase 1 glycemic control Mobility/therapy needs: Bedrest, continues to require restraints for patient safety Antibiotic  de-escalation: On no antibiotic Home medication reconciliation: On hold Daily labs: CBC, BMP Code Status: Full Family Communication: We will update family Disposition: ICU   Labs   CBC: Recent Labs  Lab 02/12/20 0052 02/16/20 0507  WBC 11.0* 9.2  NEUTROABS  --  8.1*  HGB 15.1 15.8  HCT 44.8 48.8  MCV 92.8 92.8  PLT 266 478   Basic Metabolic Panel: Recent Labs  Lab 02/12/20 0052 02/15/20 1850 02/16/20 0507  NA 134*  --  135  K 4.0  --  3.3*  CL 103  --  107  CO2 20*  --  22  GLUCOSE 141*  --  151*  BUN 13  --  19  CREATININE 0.76  --  0.54*  CALCIUM 7.7*  --  6.9*  MG 2.3 2.3 2.0  PHOS 2.5 2.7 2.5   GFR: Estimated Creatinine Clearance: 149.3 mL/min (A) (by C-G formula based on SCr of 0.54 mg/dL (L)). Recent Labs  Lab 02/12/20 0052 02/16/20 0507  WBC 11.0* 9.2   Liver Function Tests: No results for input(s): AST, ALT, ALKPHOS, BILITOT, PROT, ALBUMIN in the last 168 hours. No results for input(s): LIPASE, AMYLASE in the last 168 hours. No results for input(s): AMMONIA in the last 168 hours. ABG    Component Value Date/Time   HCO3 24.1 02/14/2020 1627   O2SAT 80.4 02/14/2020 1627    Coagulation Profile: No results for input(s): INR, PROTIME in the last 168 hours. Cardiac Enzymes: No results for input(s): CKTOTAL, CKMB, CKMBINDEX, TROPONINI in the last 168 hours. HbA1C: Hgb A1c MFr Bld  Date/Time Value Ref Range Status  02/11/2020 08:11 PM 5.8 (H) 4.8 - 5.6 % Final    Comment:    (NOTE) Pre diabetes:          5.7%-6.4%  Diabetes:              >6.4%  Glycemic control for   <7.0% adults with diabetes    CBG: Recent Labs  Lab 02/15/20 1935 02/15/20 2311 02/16/20 0323 02/16/20 0757 02/16/20 1134  GLUCAP 150* 158* 155* 160* 160*    CRITICAL CARE Performed by: Kipp Brood   Total critical care time: 35 minutes  Critical care time was exclusive of separately billable procedures and treating other patients.  Critical care was necessary  to treat or prevent imminent or life-threatening deterioration.  Critical care was time spent personally by me on the following activities: development of treatment plan with patient and/or surrogate as well as nursing, discussions with consultants, evaluation of patient's response to treatment, examination of patient, obtaining history from patient or surrogate, ordering and performing treatments and interventions, ordering and review of laboratory studies, ordering and review of radiographic studies, pulse oximetry, re-evaluation of patient's condition and participation in multidisciplinary rounds.  Kipp Brood, MD [facility] ICU Physician [place]  Pager: [phone] Mobile: [phone] After hours: [phone].

## 2020-02-16 NOTE — Consult Note (Addendum)
Neurology Consultation Reason for Consult: Concern for seizures Referring Physician: Dr. Duffy Rhody  CC: Glioblastoma  History is obtained from: Chart review as patient is intubated and sedated  HPI: Antonio Glenn is a 59 y.o. 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  On 11/25, he was seen by his neighbors without a shirt and acting strange.  He was taken to [facility] ED and was noted to have seizure-like activity as well as right-sided weakness.  He was then transferred to [facility] ED for possible surgery.  He was started on Keppra and an EEG was obtained which showed periodic epileptiform discharges in left frontotemporal region at 0.5 Hz consistent with underlying tumor.  However since admission, patient has had encephalopathy and agitation which is disproportionate to the mass per neurosurgery.  Therefore neurology was consulted for further management of encephalopathy and concern for intermittent seizures.  ROS:  Unable to obtain due to altered mental status.   Past Medical History:  Diagnosis Date  . Brain tumor ([place])    Left parietal brain tumor  . PICC (peripherally inserted central catheter) in place 02/14/2020  . Seizure ([place]) 02/14/2020    Family history: Unable to obtain due to intubation and sedation  Social History: Unable to obtain due to intubation and sedation  Exam: Current vital signs: BP (!) 149/79 (BP Location: Left Leg)   Pulse (!) 52   Temp 98.4 F (36.9 C) (Axillary)   Resp (!) 7   Ht 6\' 3"  (1.905 m)   Wt (!) 142.1 kg   SpO2 97%   BMI 39.16 kg/m  Vital signs in last 24 hours: Temp:  [97.6 F (36.4 C)-99.7 F (37.6 C)] 98.4 F (36.9 C) (11/30 1200) Pulse Rate:  [50-67] 52 (11/30 1200) Resp:  [7-20] 7 (11/30 1200) BP: (131-183)/(65-93) 149/79 (11/30 1200) SpO2:  [89 %-100 %] 97 % (11/30 1200) Weight:  [142.1 kg] 142.1 kg (11/30 0500)   Physical Exam  Constitutional: Laying in bed, not wearing any  clothes Psych: Unable to assess due to agitation Eyes: No scleral injection HENT: No OP obstrucion Head: Normocephalic.  Cardiovascular: Normal rate and regular rhythm.  Respiratory: Intubated  Skin: Warm  Neuro: Awake, looks at the examiner but does not follow commands, spontaneously moving eyes in all directions, no facial asymmetry, moving all 4 extremities in bed.  I have reviewed labs in epic and the results pertinent to this consultation are: No leukocytosis, normal electrolytes,  I have reviewed the images obtained: CT head with and without contrast 02/12/2020:No substantial change in the left parietal mass, which was better characterized on recent MRI and is concerning for high-grade primary CNS neoplasm. No progressive mass effect or evidence of superimposed acute intracranial abnormality.  MRI brain with and without contrast 02/16/2020: Left parietal mass concerning for high-grade primary CNS neoplasm such as glioblastoma. Mild surrounding edema and/or nonenhancing tumor.       ASSESSMENT/PLAN: 59 year old male with recent diagnosis of left parietal mass concerning for GBM, seizures with persistent encephalopathy which is suspected to be out of proportion to underlying mass.  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 -Low suspicion for infectious etiology due to patient being afebrile, no leukocytosis.  Difficult to  assess for neck rigidity due to significant agitation. -No clear toxic-metabolic etiology, per RN patient's daughter states he is a social drinker -It is possible that patient is having intermittent seizures which is contributing to patient's current  mental status.  Ideally we would like to obtain long-term EEG to assess for intermittent seizures.  However, after discussion with Dr. Doyne Keel, patient is very agitated and it will be difficult to obtain EEG -Even if we see seizures on EEG, patient has been receiving intermittent phenobarbital and Ativan for agitation.  Additionally, removing the underlying mass will help with seizure control and patient has surgery planned for tomorrow. -Therefore, will hold off on EEG at this point. -We will reassess patient after surgery if he continues to be persistently encephalopathic and consider EEG at that point. -Continue seizure precautions -Management of rest of comorbidities per primary team  I have spent a total of  90  minutes with the patient reviewing hospital notes,  test results, labs and examining the patient as well as establishing an assessment and plan.  > 50% of time was spent in direct patient care.    Thank you for allowing [place] to perspective care of this patient.  Neurology will follow.  Please call us if you have any further questions.  Zeb Comfort Epilepsy [facility]

## 2020-02-16 NOTE — Progress Notes (Signed)
0.46 mL of Phenobarbital wasted in stericycle with Phebe Colla RN.

## 2020-02-16 NOTE — Progress Notes (Signed)
NAME:  Antonio Glenn, MRN:  [id number], DOB:  1960-08-23, LOS: 5 ADMISSION DATE:  02/11/2020, CONSULTATION DATE:  02/11/20 REFERRING MD:  Viona Gilmore  CHIEF COMPLAINT:  AMS / Seizures   Brief History   Antonio Glenn is a 59 y.o. male with hx of recently diagnosed brain tumor who was transferred from [place] ED to [facility] 11/25 with AMS and seizures.  He is scheduled for left craniotomy on Dec 1 with Dr. Marcello Moores.  Past Medical History  Brain Mass.  Significant Hospital Events   11/25 > admit.  Consults:  Neurosurgery  Procedures:  None.  Significant Diagnostic Tests:  CT head 11/25 > 6 - 7cm mass in left parietal lobe with areas of internal cystic necrosis.  Mild regional mass effect with no MLS.   EEG 11/25 >   Micro Data:  Flu 11/25 >  COVID 11/25 >   Antimicrobials:  None.   Interim history/subjective:  Planes of severe agitation yesterday breaking through restraints.  Several load during doses of phenobarbital were given.  He has been relatively calmer since then.  Objective:  Blood pressure (!) 149/79, pulse (!) 52, temperature 98.4 F (36.9 C), temperature source Axillary, resp. rate (!) 7, height 6\' 3"  (1.905 m), weight (!) 142.1 kg, SpO2 97 %.        Intake/Output Summary (Last 24 hours) at 02/16/2020 1417 Last data filed at 02/16/2020 1200 Gross per 24 hour  Intake 3235.17 ml  Output 400 ml  Net 2835.17 ml   Filed Weights   02/14/20 0400 02/15/20 0500 02/16/20 0500  Weight: (!) 144.1 kg (!) 142.6 kg (!) 142.1 kg    Examination: General: Middle-aged morbidly obese male, lying on the bed, in restraints,  Neuro: Opens eyes with vocal stimuli, yells out but does not follow commands, moving all 4 extremities spontaneously.  Pupils 3 mm bilateral reactive to light HEENT: [place]/AT. Sclerae anicteric.  Moist mucous membranes EOMI. Cardiovascular: RRR, no M/R/G.  Lungs: Clear to auscultation bilaterally Abdomen: BS x 4, soft, NT/ND.  Musculoskeletal: No gross  deformities, no edema.  Skin: Intact, warm, no rashes.  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  Unclear whether mass is sole cause of agitation.  Possible component of withdrawal. -Scheduled phenobarbital. -Continue dexamethasone 10 mg every 6 hours  No more clinical seizures noted since he was admitted, EEG was negative for active seizures but he does have epileptogenic foci in left frontotemporal region -Continue Keppra 500 mg twice daily -Continue lorazepam as needed for seizure activity Patient will have tumor resection/craniotomy for a scheduled on 12/1 by Dr. Marcello Moores Patient remained in sinus rhythm, continue Cardizem Holding anticoagulation for stroke prophylaxis due to scheduled brain surgery in few days   Daily Goals Checklist  Pain/Anxiety/Delirium protocol (if indicated): Trial of Geodon, continue Precedex Neuro vitals: every 4 hours AED's: Keppra 500 twice daily VAP protocol (if indicated): Not intubated Respiratory support goals: Encourage cough and deep breathing Blood pressure target: Keep systolic blood pressure less than 170 DVT prophylaxis: Mechanical prophylaxis Nutrition Status: Low nutritional risk we will place new core track tomorrow. GI prophylaxis: Pantoprazole  40 mg daily Fluid status goals: Allow autoregulation Urinary catheter: Condom catheter Central lines: None Glucose control: Phase 1 glycemic control Mobility/therapy needs: Bedrest, continues to require restraints for patient safety Antibiotic  de-escalation: On no antibiotic Home medication reconciliation: On hold Daily labs: CBC, BMP Code Status: Full Family Communication: Daughter updated today by neurosurgery. Disposition: ICU   Labs   CBC: Recent Labs  Lab 02/12/20 0052 02/16/20 0507  WBC 11.0* 9.2  NEUTROABS  --  8.1*  HGB 15.1 15.8  HCT 44.8 48.8  MCV 92.8 92.8  PLT 266 599   Basic Metabolic Panel: Recent Labs  Lab 02/12/20 0052 02/15/20 1850 02/16/20 0507  NA 134*  --  135  K 4.0  --  3.3*  CL 103  --  107  CO2 20*  --  22  GLUCOSE 141*  --  151*  BUN 13  --  19  CREATININE 0.76  --  0.54*  CALCIUM 7.7*  --  6.9*  MG 2.3 2.3 2.0  PHOS 2.5 2.7 2.5   GFR: Estimated Creatinine Clearance: 149.3 mL/min (A) (by C-G formula based on SCr of 0.54 mg/dL (L)). Recent Labs  Lab 02/12/20 0052 02/16/20 0507  WBC 11.0* 9.2   Liver Function Tests: No results for input(s): AST, ALT, ALKPHOS, BILITOT, PROT, ALBUMIN in the last 168 hours. No results for input(s): LIPASE, AMYLASE in the last 168 hours. No results for input(s): AMMONIA in the last 168 hours. ABG    Component Value Date/Time   HCO3 24.1 02/14/2020 1627   O2SAT 80.4 02/14/2020 1627    Coagulation Profile: No results for input(s): INR, PROTIME in the last 168 hours. Cardiac Enzymes: No results for input(s): CKTOTAL, CKMB, CKMBINDEX, TROPONINI in the last 168 hours. HbA1C: Hgb A1c MFr Bld  Date/Time Value Ref Range Status  02/11/2020 08:11 PM 5.8 (H) 4.8 - 5.6 % Final    Comment:    (NOTE) Pre diabetes:          5.7%-6.4%  Diabetes:              >6.4%  Glycemic control for   <7.0% adults with diabetes    CBG: Recent Labs  Lab 02/15/20 1935 02/15/20 2311 02/16/20 0323 02/16/20 0757 02/16/20 1134  GLUCAP 150* 158* 155* 160* 160*    CRITICAL CARE Performed by: Kipp Brood   Total critical care time: 35 minutes  Critical care time was exclusive of separately billable procedures and treating other patients.  Critical  care was necessary to treat or prevent imminent or life-threatening deterioration.  Critical care was time spent personally by me on the following activities: development of treatment plan with patient and/or surrogate as well as nursing, discussions with consultants, evaluation of patient's response to treatment, examination of patient, obtaining history from patient or surrogate, ordering and performing treatments and interventions, ordering and review of laboratory studies, ordering and review of radiographic studies, pulse oximetry, re-evaluation of patient's condition and participation in multidisciplinary rounds.  Kipp Brood, MD [facility] ICU Physician [place]  Pager: [phone] Mobile: [phone] After hours: [phone].

## 2020-02-16 NOTE — Progress Notes (Signed)
Subjective: Patient remains encephalopathic  Objective: Vital signs in last 24 hours: Temp:  [97.6 F (36.4 C)-99.7 F (37.6 C)] 98.4 F (36.9 C) (11/30 1200) Pulse Rate:  [50-67] 52 (11/30 1200) Resp:  [7-20] 7 (11/30 1200) BP: (131-183)/(65-93) 149/79 (11/30 1200) SpO2:  [89 %-100 %] 97 % (11/30 1200) Weight:  [142.1 kg] 142.1 kg (11/30 0500)  Intake/Output from previous day: 11/29 0701 - 11/30 0700 In: 3192.5 [I.V.:2917.5; NG/GT:75; IV Piggyback:200] Out: 400 [Urine:400] Intake/Output this shift: Total I/O In: 770.1 [I.V.:578.1; IV Piggyback:192] Out: -  NAD Restrained, not oriented.  Speech garbled but intelligible Moving all extremities symmetrically  Lab Results: Recent Labs    02/16/20 0507  WBC 9.2  HGB 15.8  HCT 48.8  PLT 238   BMET Recent Labs    02/16/20 0507  NA 135  K 3.3*  CL 107  CO2 22  GLUCOSE 151*  BUN 19  CREATININE 0.54*  CALCIUM 6.9*    Studies/Results: Portable EEG  Result Date: 02/15/2020 Lora Havens, MD     02/15/2020  4:26 PM Patient Name: Antonio Glenn MRN: [id number] Epilepsy Attending: Lora Havens Referring Physician/Provider: Dr Duffy Rhody Date: 02/15/2020 Duration: 23.29 mins Patient history: 59 yo M with left parietal brain tumor, recent seizure who has improving but persistent non-focal delirium/encephalopathy. EEG to evaluate for seizure Level of alertness: Awake, asleep AEDs during EEG study: LEV Technical aspects: This EEG study was done with scalp electrodes positioned according to the 10-20 International system of electrode placement. Electrical activity was acquired at a sampling rate of 500Hz  and reviewed with a high frequency filter of 70Hz  and a low frequency filter of 1Hz . EEG data were recorded continuously and digitally stored. Description: No posterior dominant rhythm was seen. Sleep was characterized by vertex waves, sleep spindles (12 to 14 Hz), maximal frontocentral region. Sharp waves were noted in  left temporal region. Continuous 3-5Hz  theta-delta slowing was also noted in left hemisphere, maximal left temporal region.  Hyperventilation and photic stimulation were not performed.    ABNORMALITY - Sharp waves,  left temporal region - Continuous slow, left hemisphere, maximal left temporal region.  IMPRESSION: This study showed evidence of epileptogenicity arising from left temporal region as well as cortical dysfunction in left hemisphere, maximal left temporal region region due to underlying mass.  No seizures were seen throughout the recording. If concern for seizures persist, please consider long term monitoring. Lora Havens    Assessment/Plan: 59 yo M with left parietal lobe tumor and recent seizure who has remained encephalopathic - have consulted neurology Dr. Rory Percy to help with AED management, possible continuous EEG given the presence of spikes on spot EEG in the setting of continued encephalopathy - plan for craniotomy for resection tomorrow noon time - Patient had consented to surgery during his last clinic visit.   I discussed the plan of care as well as the procedure with daughter Alyson Ingles over the phone, who was present during the clinic visit as well.  Possibility of craniectomy, ventricular or lumbar drain placement, and central line placement was also discussed. Risks, benefits, alternatives, and expected convalescence was discussed.  Risks discussed included but were not limited to bleeding, pain, infection, seizure, stroke, scar, recurrence, cerebrospinal fluid leak, neurologic deficit, paralysis, coma and death.  I also discussed with her the possibility of blood transfusion during surgery and consent to transfuse blood products if necessary was also obtained.  All questions and concerns were answered and understanding and agreement with the  plan was verbalized.    [name] 02/16/2020, 1:24 PM

## 2020-02-16 NOTE — Progress Notes (Signed)
PCCM Interval Progress Note  Called and asked to assess pt at bedside for agitation. Currently on 55mcg/kg/hr precedex and receiving 5mg  haldol q6hrs as well as ativan 2mg  q50min.  Chart reviewed, started on daily phenobarb earlier today.  Per RN, received 1 time dose 200mg  last night and this seemed to be most effective.  Will add 1 time dose 200mg  phenobarb now. Continue wrist and ankle restraints. Continue precedex, haldol, ativan.   Montey Hora, [facility] 02/16/2020, 8:44 PM

## 2020-02-16 NOTE — Anesthesia Preprocedure Evaluation (Addendum)
Anesthesia Evaluation  Patient identified by MRN, date of birth, ID band Patient unresponsive    Reviewed: Allergy & Precautions, NPO status , Patient's Chart, lab work & pertinent test results  Airway Mallampati: II  TM Distance: >3 FB Neck ROM: Full    Dental  (+) Teeth Intact   Pulmonary neg pulmonary ROS,    + rhonchi  + decreased breath sounds      Cardiovascular negative cardio ROS Normal cardiovascular exam Rhythm:Regular Rate:Normal     Neuro/Psych Seizures - (Keppra),  Left parietal brain tumor    GI/Hepatic negative GI ROS, Neg liver ROS,   Endo/Other  negative endocrine ROSObesity   Renal/GU negative Renal ROS     Musculoskeletal negative musculoskeletal ROS (+)   Abdominal   Peds  Hematology negative hematology ROS (+)   Anesthesia Other Findings   Reproductive/Obstetrics                            Anesthesia Physical Anesthesia Plan  ASA: III  Anesthesia Plan: General   Post-op Pain Management:    Induction: Intravenous  PONV Risk Score and Plan: 2 and Midazolam, Dexamethasone, Ondansetron and TIVA  Airway Management Planned: Oral ETT  Additional Equipment: Arterial line  Intra-op Plan:   Post-operative Plan: Post-operative intubation/ventilation  Informed Consent: I have reviewed the patients History and Physical, chart, labs and discussed the procedure including the risks, benefits and alternatives for the proposed anesthesia with the patient or authorized representative who has indicated his/her understanding and acceptance.     History available from chart only and Consent reviewed with POA  Plan Discussed with:   Anesthesia Plan Comments: (Phone consent with patient's daughter.)       Anesthesia Quick Evaluation

## 2020-02-16 NOTE — Progress Notes (Signed)
eLink Physician-Brief Progress Note Patient Name: TORIE PRIEBE DOB: [birth year] MRN: [id number]   Date of Service  02/16/2020  HPI/Events of Note  Severe Agitation - He has ripped out his Foley and soft restraints. He is not intubated. He is currently sedated with Precedex at 2 mcg/kg/hour, has just received Haldol 5 mg IV and Ativan 2 mg IV. I honestly don't know where to go for further sedation in a patient who is not intubated. In addition, nursing is asking for violent restraints.   eICU Interventions  Will request that the ground team evaluate the patient at bedside.      Intervention Category Major Interventions: Delirium, psychosis, severe agitation - evaluation and management  Sondra Blixt [place] 02/16/2020, 8:30 PM

## 2020-02-17 ENCOUNTER — Inpatient Hospital Stay (HOSPITAL_COMMUNITY): Payer: BC Managed Care – PPO

## 2020-02-17 ENCOUNTER — Other Ambulatory Visit: Payer: Self-pay | Admitting: Radiation Therapy

## 2020-02-17 ENCOUNTER — Inpatient Hospital Stay (HOSPITAL_COMMUNITY)
Admission: RE | Disposition: A | Discharge status: Hospice | Payer: Self-pay | Source: Home / Self Care | Attending: Neurosurgery

## 2020-02-17 ENCOUNTER — Encounter (HOSPITAL_COMMUNITY): Payer: Self-pay | Admitting: Pulmonary Disease

## 2020-02-17 ENCOUNTER — Inpatient Hospital Stay (HOSPITAL_COMMUNITY): Payer: BC Managed Care – PPO | Admitting: Certified Registered Nurse Anesthetist

## 2020-02-17 HISTORY — PX: APPLICATION OF CRANIAL NAVIGATION: SHX6578

## 2020-02-17 HISTORY — PX: CRANIOTOMY: SHX93

## 2020-02-17 LAB — POCT I-STAT 7, (LYTES, BLD GAS, ICA,H+H)
Acid-Base Excess: 0 mmol/L (ref 0.0–2.0)
Acid-base deficit: 1 mmol/L (ref 0.0–2.0)
Bicarbonate: 25 mmol/L (ref 20.0–28.0)
Bicarbonate: 25.3 mmol/L (ref 20.0–28.0)
Calcium, Ion: 1.15 mmol/L (ref 1.15–1.40)
Calcium, Ion: 1.08 mmol/L — ABNORMAL LOW (ref 1.15–1.40)
HCT: 47 % (ref 39.0–52.0)
HCT: 47 % (ref 39.0–52.0)
Hemoglobin: 16 g/dL (ref 13.0–17.0)
Hemoglobin: 16 g/dL (ref 13.0–17.0)
O2 Saturation: 93 %
O2 Saturation: 100 %
Potassium: 3.5 mmol/L (ref 3.5–5.1)
Potassium: 3.5 mmol/L (ref 3.5–5.1)
Sodium: 137 mmol/L (ref 135–145)
Sodium: 137 mmol/L (ref 135–145)
TCO2: 26 mmol/L (ref 22–32)
TCO2: 27 mmol/L (ref 22–32)
pCO2 arterial: 45.9 mmHg (ref 32.0–48.0)
pCO2 arterial: 43.6 mmHg (ref 32.0–48.0)
pH, Arterial: 7.344 — ABNORMAL LOW (ref 7.350–7.450)
pH, Arterial: 7.371 (ref 7.350–7.450)
pO2, Arterial: 72 mmHg — ABNORMAL LOW (ref 83.0–108.0)
pO2, Arterial: 275 mmHg — ABNORMAL HIGH (ref 83.0–108.0)

## 2020-02-17 LAB — ABO/RH: ABO/RH(D): O POS

## 2020-02-17 LAB — GLUCOSE, CAPILLARY
Glucose-Capillary: 147 mg/dL — ABNORMAL HIGH (ref 70–99)
Glucose-Capillary: 146 mg/dL — ABNORMAL HIGH (ref 70–99)
Glucose-Capillary: 138 mg/dL — ABNORMAL HIGH (ref 70–99)
Glucose-Capillary: 130 mg/dL — ABNORMAL HIGH (ref 70–99)
Glucose-Capillary: 144 mg/dL — ABNORMAL HIGH (ref 70–99)
Glucose-Capillary: 97 mg/dL (ref 70–99)

## 2020-02-17 LAB — CBC WITH DIFFERENTIAL/PLATELET
Abs Immature Granulocytes: 0.05 10*3/uL (ref 0.00–0.07)
Basophils Absolute: 0 10*3/uL (ref 0.0–0.1)
Basophils Relative: 0 %
Eosinophils Absolute: 0 10*3/uL (ref 0.0–0.5)
Eosinophils Relative: 0 %
HCT: 45.9 % (ref 39.0–52.0)
Hemoglobin: 14.7 g/dL (ref 13.0–17.0)
Immature Granulocytes: 1 %
Lymphocytes Relative: 4 %
Lymphs Abs: 0.4 10*3/uL — ABNORMAL LOW (ref 0.7–4.0)
MCH: 29.9 pg (ref 26.0–34.0)
MCHC: 32 g/dL (ref 30.0–36.0)
MCV: 93.5 fL (ref 80.0–100.0)
Monocytes Absolute: 0.8 10*3/uL (ref 0.1–1.0)
Monocytes Relative: 8 %
Neutro Abs: 9 10*3/uL — ABNORMAL HIGH (ref 1.7–7.7)
Neutrophils Relative %: 87 %
Platelets: 232 10*3/uL (ref 150–400)
RBC: 4.91 MIL/uL (ref 4.22–5.81)
RDW: 13.3 % (ref 11.5–15.5)
WBC: 10.3 10*3/uL (ref 4.0–10.5)
nRBC: 0 % (ref 0.0–0.2)

## 2020-02-17 LAB — TYPE AND SCREEN
ABO/RH(D): O POS
Antibody Screen: NEGATIVE

## 2020-02-17 LAB — BASIC METABOLIC PANEL
Anion gap: 8 (ref 5–15)
BUN: 19 mg/dL (ref 6–20)
CO2: 24 mmol/L (ref 22–32)
Calcium: 7.1 mg/dL — ABNORMAL LOW (ref 8.9–10.3)
Chloride: 109 mmol/L (ref 98–111)
Creatinine, Ser: 0.67 mg/dL (ref 0.61–1.24)
GFR, Estimated: >60 mL/min (ref >60)
Glucose, Bld: 145 mg/dL — ABNORMAL HIGH (ref 70–99)
Potassium: 3.9 mmol/L (ref 3.5–5.1)
Sodium: 141 mmol/L (ref 135–145)

## 2020-02-17 LAB — PROTIME-INR
INR: 1.2 (ref 0.8–1.2)
Prothrombin Time: 14.6 seconds (ref 11.4–15.2)

## 2020-02-17 LAB — TRIGLYCERIDES: Triglycerides: 294 mg/dL — ABNORMAL HIGH (ref <150)

## 2020-02-17 LAB — APTT: aPTT: 27 seconds (ref 24–36)

## 2020-02-17 LAB — MAGNESIUM: Magnesium: 2.1 mg/dL (ref 1.7–2.4)

## 2020-02-17 LAB — PHOSPHORUS: Phosphorus: 3 mg/dL (ref 2.5–4.6)

## 2020-02-17 SURGERY — CRANIOTOMY TUMOR EXCISION
Anesthesia: General | Site: Head | Laterality: Left

## 2020-02-17 MED ORDER — ONDANSETRON HCL 4 MG/2ML IJ SOLN
4.0000 mg | INTRAMUSCULAR | Status: DC | PRN
  Administered 2020-02-29: 4 mg via INTRAVENOUS
  Filled 2020-02-17: qty 2

## 2020-02-17 MED ORDER — SODIUM CHLORIDE (PF) 0.9 % IJ SOLN
INTRAMUSCULAR | Status: AC
  Filled 2020-02-17: qty 20

## 2020-02-17 MED ORDER — 0.9 % SODIUM CHLORIDE (POUR BTL) OPTIME
TOPICAL | Status: DC | PRN
  Administered 2020-02-17 (×3): 1000 mL

## 2020-02-17 MED ORDER — ALBUMIN HUMAN 5 % IV SOLN: INTRAVENOUS | Status: DC | PRN

## 2020-02-17 MED ORDER — CHLORHEXIDINE GLUCONATE 0.12 % MT SOLN
OROMUCOSAL | Status: AC
  Administered 2020-02-17: 15 mL via OROMUCOSAL
  Filled 2020-02-17: qty 15

## 2020-02-17 MED ORDER — ROCURONIUM BROMIDE 10 MG/ML (PF) SYRINGE
PREFILLED_SYRINGE | INTRAVENOUS | Status: DC | PRN
  Administered 2020-02-17: 60 mg via INTRAVENOUS

## 2020-02-17 MED ORDER — PHENYLEPHRINE HCL-NACL 10-0.9 MG/250ML-% IV SOLN
INTRAVENOUS | Status: AC
  Filled 2020-02-17: qty 250

## 2020-02-17 MED ORDER — FUROSEMIDE 10 MG/ML IJ SOLN
INTRAMUSCULAR | Status: AC
  Administered 2020-02-17: 40 mg via INTRAVENOUS
  Filled 2020-02-17: qty 4

## 2020-02-17 MED ORDER — SODIUM CHLORIDE 0.9 % IV SOLN
0.0500 ug/kg/min | INTRAVENOUS | Status: AC
  Administered 2020-02-17: .1 ug/kg/min via INTRAVENOUS
  Filled 2020-02-17: qty 5000

## 2020-02-17 MED ORDER — ACETAMINOPHEN 325 MG PO TABS
650.0000 mg | ORAL_TABLET | ORAL | Status: DC | PRN
  Administered 2020-03-05: 650 mg
  Filled 2020-02-17: qty 2

## 2020-02-17 MED ORDER — ONDANSETRON HCL 4 MG/2ML IJ SOLN
INTRAMUSCULAR | Status: DC | PRN
  Administered 2020-02-17: 4 mg via INTRAVENOUS

## 2020-02-17 MED ORDER — LIDOCAINE-EPINEPHRINE 1 %-1:100000 IJ SOLN
INTRAMUSCULAR | Status: AC
  Filled 2020-02-17: qty 1

## 2020-02-17 MED ORDER — BACITRACIN ZINC 500 UNIT/GM EX OINT
TOPICAL_OINTMENT | CUTANEOUS | Status: DC | PRN
  Administered 2020-02-17: 1 via TOPICAL

## 2020-02-17 MED ORDER — MANNITOL 25 % IV SOLN
INTRAVENOUS | Status: DC | PRN
  Administered 2020-02-17 (×2): 35 g via INTRAVENOUS

## 2020-02-17 MED ORDER — THROMBIN 5000 UNITS EX SOLR: OROMUCOSAL | Status: DC | PRN

## 2020-02-17 MED ORDER — PROPOFOL 10 MG/ML IV BOLUS
INTRAVENOUS | Status: DC | PRN
  Administered 2020-02-17: 120 mg via INTRAVENOUS
  Administered 2020-02-17: 100 mg via INTRAVENOUS

## 2020-02-17 MED ORDER — PROPOFOL 1000 MG/100ML IV EMUL
INTRAVENOUS | Status: AC
  Filled 2020-02-17: qty 300

## 2020-02-17 MED ORDER — PROPOFOL 10 MG/ML IV BOLUS
INTRAVENOUS | Status: AC
  Filled 2020-02-17: qty 40

## 2020-02-17 MED ORDER — ACETAMINOPHEN 650 MG RE SUPP: 650.0000 mg | RECTAL | Status: DC | PRN

## 2020-02-17 MED ORDER — ACETAMINOPHEN 500 MG PO TABS: 1000.0000 mg | ORAL_TABLET | Freq: Once | ORAL | Status: DC

## 2020-02-17 MED ORDER — DEXAMETHASONE SODIUM PHOSPHATE 10 MG/ML IJ SOLN
INTRAMUSCULAR | Status: DC | PRN
  Administered 2020-02-17: 10 mg via INTRAVENOUS

## 2020-02-17 MED ORDER — BACITRACIN ZINC 500 UNIT/GM EX OINT
TOPICAL_OINTMENT | CUTANEOUS | Status: AC
  Filled 2020-02-17: qty 28.35

## 2020-02-17 MED ORDER — PROPOFOL 1000 MG/100ML IV EMUL
5.0000 ug/kg/min | INTRAVENOUS | Status: DC
  Administered 2020-02-17: 50 ug/kg/min via INTRAVENOUS
  Administered 2020-02-17: 40 ug/kg/min via INTRAVENOUS
  Administered 2020-02-17: 50 ug/kg/min via INTRAVENOUS
  Administered 2020-02-18: 55 ug/kg/min via INTRAVENOUS
  Administered 2020-02-18: 40 ug/kg/min via INTRAVENOUS
  Administered 2020-02-18: 20 ug/kg/min via INTRAVENOUS
  Administered 2020-02-19: 30 ug/kg/min via INTRAVENOUS
  Administered 2020-02-19: 20 ug/kg/min via INTRAVENOUS
  Administered 2020-02-19: 25 ug/kg/min via INTRAVENOUS
  Administered 2020-02-20: 20 ug/kg/min via INTRAVENOUS
  Administered 2020-02-20 (×3): 25 ug/kg/min via INTRAVENOUS
  Administered 2020-02-21: 30 ug/kg/min via INTRAVENOUS
  Administered 2020-02-21: 40 ug/kg/min via INTRAVENOUS
  Administered 2020-02-21: 30 ug/kg/min via INTRAVENOUS
  Administered 2020-02-21: 40 ug/kg/min via INTRAVENOUS
  Administered 2020-02-21 (×3): 30 ug/kg/min via INTRAVENOUS
  Administered 2020-02-21 – 2020-02-22 (×8): 40 ug/kg/min via INTRAVENOUS
  Administered 2020-02-23: 30 ug/kg/min via INTRAVENOUS
  Administered 2020-02-23: 40 ug/kg/min via INTRAVENOUS
  Administered 2020-02-23: 30 ug/kg/min via INTRAVENOUS
  Administered 2020-02-23: 45 ug/kg/min via INTRAVENOUS
  Administered 2020-02-23 (×2): 50 ug/kg/min via INTRAVENOUS
  Administered 2020-02-23: 60 ug/kg/min via INTRAVENOUS
  Administered 2020-02-23: 40 ug/kg/min via INTRAVENOUS
  Administered 2020-02-23: 45 ug/kg/min via INTRAVENOUS
  Administered 2020-02-24: 30 ug/kg/min via INTRAVENOUS
  Administered 2020-02-24 (×2): 40 ug/kg/min via INTRAVENOUS
  Administered 2020-02-24: 50 ug/kg/min via INTRAVENOUS
  Administered 2020-02-24 (×2): 40 ug/kg/min via INTRAVENOUS
  Administered 2020-02-25 (×2): 45 ug/kg/min via INTRAVENOUS
  Administered 2020-02-25: 25 ug/kg/min via INTRAVENOUS
  Administered 2020-02-25: 50 ug/kg/min via INTRAVENOUS
  Administered 2020-02-25: 25 ug/kg/min via INTRAVENOUS
  Administered 2020-02-25 (×2): 50 ug/kg/min via INTRAVENOUS
  Administered 2020-02-25: 45 ug/kg/min via INTRAVENOUS
  Administered 2020-02-25 – 2020-02-26 (×2): 50 ug/kg/min via INTRAVENOUS
  Administered 2020-02-26: 40 ug/kg/min via INTRAVENOUS
  Administered 2020-02-26: 50 ug/kg/min via INTRAVENOUS
  Administered 2020-02-26: 40 ug/kg/min via INTRAVENOUS
  Administered 2020-02-26: 50 ug/kg/min via INTRAVENOUS
  Administered 2020-02-26: 60 ug/kg/min via INTRAVENOUS
  Administered 2020-02-27: 35 ug/kg/min via INTRAVENOUS
  Administered 2020-02-27 (×4): 50 ug/kg/min via INTRAVENOUS
  Administered 2020-02-27 – 2020-02-28 (×3): 60 ug/kg/min via INTRAVENOUS
  Administered 2020-02-28: 40 ug/kg/min via INTRAVENOUS
  Administered 2020-02-28: 60 ug/kg/min via INTRAVENOUS
  Administered 2020-02-28: 30 ug/kg/min via INTRAVENOUS
  Administered 2020-02-28: 60 ug/kg/min via INTRAVENOUS
  Administered 2020-02-28: 30 ug/kg/min via INTRAVENOUS
  Administered 2020-02-28: 50 ug/kg/min via INTRAVENOUS
  Administered 2020-02-29: 40 ug/kg/min via INTRAVENOUS
  Administered 2020-02-29: 35 ug/kg/min via INTRAVENOUS
  Administered 2020-02-29: 30 ug/kg/min via INTRAVENOUS
  Administered 2020-02-29: 20 ug/kg/min via INTRAVENOUS
  Administered 2020-02-29 (×2): 40 ug/kg/min via INTRAVENOUS
  Administered 2020-02-29: 30 ug/kg/min via INTRAVENOUS
  Administered 2020-03-01: 40 ug/kg/min via INTRAVENOUS
  Administered 2020-03-01 (×2): 25 ug/kg/min via INTRAVENOUS
  Administered 2020-03-01: 35 ug/kg/min via INTRAVENOUS
  Administered 2020-03-01 – 2020-03-02 (×3): 25 ug/kg/min via INTRAVENOUS
  Administered 2020-03-02 (×2): 30 ug/kg/min via INTRAVENOUS
  Filled 2020-02-17 (×5): qty 100
  Filled 2020-02-17: qty 200
  Filled 2020-02-17 (×6): qty 100
  Filled 2020-02-17: qty 200
  Filled 2020-02-17 (×17): qty 100
  Filled 2020-02-17: qty 200
  Filled 2020-02-17 (×26): qty 100
  Filled 2020-02-17: qty 200
  Filled 2020-02-17: qty 100
  Filled 2020-02-17: qty 200
  Filled 2020-02-17 (×30): qty 100
  Filled 2020-02-17: qty 200
  Filled 2020-02-17 (×2): qty 100

## 2020-02-17 MED ORDER — POLYETHYLENE GLYCOL 3350 17 G PO PACK
17.0000 g | PACK | Freq: Every day | ORAL | Status: DC | PRN
  Administered 2020-02-20 – 2020-02-23 (×3): 17 g
  Filled 2020-02-17 (×3): qty 1

## 2020-02-17 MED ORDER — DOCUSATE SODIUM 100 MG PO CAPS: 100.0000 mg | ORAL_CAPSULE | Freq: Two times a day (BID) | ORAL | Status: DC

## 2020-02-17 MED ORDER — ONDANSETRON HCL 4 MG PO TABS: 4.0000 mg | ORAL_TABLET | ORAL | Status: DC | PRN

## 2020-02-17 MED ORDER — THROMBIN 20000 UNITS EX SOLR
CUTANEOUS | Status: AC
  Filled 2020-02-17: qty 20000

## 2020-02-17 MED ORDER — LABETALOL HCL 5 MG/ML IV SOLN
10.0000 mg | INTRAVENOUS | Status: DC | PRN
  Administered 2020-02-22 – 2020-02-23 (×3): 10 mg via INTRAVENOUS
  Filled 2020-02-17 (×3): qty 4

## 2020-02-17 MED ORDER — ONDANSETRON HCL 4 MG/2ML IJ SOLN
INTRAMUSCULAR | Status: AC
  Filled 2020-02-17: qty 2

## 2020-02-17 MED ORDER — PROPOFOL 1000 MG/100ML IV EMUL
INTRAVENOUS | Status: AC
  Filled 2020-02-17: qty 100

## 2020-02-17 MED ORDER — ENOXAPARIN SODIUM 40 MG/0.4ML ~~LOC~~ SOLN
40.0000 mg | SUBCUTANEOUS | Status: DC
  Administered 2020-02-19 – 2020-04-01 (×43): 40 mg via SUBCUTANEOUS
  Filled 2020-02-17 (×43): qty 0.4

## 2020-02-17 MED ORDER — FENTANYL CITRATE (PF) 250 MCG/5ML IJ SOLN
INTRAMUSCULAR | Status: AC
  Filled 2020-02-17: qty 5

## 2020-02-17 MED ORDER — PHENYLEPHRINE HCL-NACL 10-0.9 MG/250ML-% IV SOLN
INTRAVENOUS | Status: DC | PRN
  Administered 2020-02-17: 25 ug/min via INTRAVENOUS

## 2020-02-17 MED ORDER — PROMETHAZINE HCL 25 MG PO TABS: 12.5000 mg | ORAL_TABLET | ORAL | Status: DC | PRN

## 2020-02-17 MED ORDER — DEXTROSE 5 % IV SOLN
INTRAVENOUS | Status: DC | PRN
  Administered 2020-02-17: 3 g via INTRAVENOUS

## 2020-02-17 MED ORDER — EPHEDRINE SULFATE-NACL 50-0.9 MG/10ML-% IV SOSY
PREFILLED_SYRINGE | INTRAVENOUS | Status: DC | PRN
  Administered 2020-02-17: 10 mg via INTRAVENOUS

## 2020-02-17 MED ORDER — PANTOPRAZOLE SODIUM 40 MG IV SOLR
40.0000 mg | Freq: Every day | INTRAVENOUS | Status: DC
  Administered 2020-02-17: 40 mg via INTRAVENOUS
  Filled 2020-02-17: qty 40

## 2020-02-17 MED ORDER — MORPHINE SULFATE (PF) 2 MG/ML IV SOLN
1.0000 mg | INTRAVENOUS | Status: DC | PRN
  Administered 2020-02-18 (×3): 2 mg via INTRAVENOUS
  Filled 2020-02-17 (×4): qty 1

## 2020-02-17 MED ORDER — PROPOFOL 500 MG/50ML IV EMUL
INTRAVENOUS | Status: DC | PRN
  Administered 2020-02-17: 175 ug/kg/min via INTRAVENOUS
  Administered 2020-02-17 (×2): 150 ug/kg/min via INTRAVENOUS

## 2020-02-17 MED ORDER — CEFAZOLIN SODIUM-DEXTROSE 2-4 GM/100ML-% IV SOLN
2.0000 g | Freq: Three times a day (TID) | INTRAVENOUS | Status: AC
  Administered 2020-02-17 – 2020-02-18 (×3): 2 g via INTRAVENOUS
  Filled 2020-02-17 (×3): qty 100

## 2020-02-17 MED ORDER — THROMBIN 5000 UNITS EX SOLR
CUTANEOUS | Status: AC
  Filled 2020-02-17: qty 5000

## 2020-02-17 MED ORDER — ORAL CARE MOUTH RINSE
15.0000 mL | OROMUCOSAL | Status: DC
  Administered 2020-02-17 – 2020-03-03 (×148): 15 mL via OROMUCOSAL

## 2020-02-17 MED ORDER — FUROSEMIDE 10 MG/ML IJ SOLN
40.0000 mg | INTRAMUSCULAR | Status: AC
  Filled 2020-02-17: qty 4

## 2020-02-17 MED ORDER — LIDOCAINE HCL (CARDIAC) PF 100 MG/5ML IV SOSY
PREFILLED_SYRINGE | INTRAVENOUS | Status: DC | PRN
  Administered 2020-02-17: 100 mg via INTRAVENOUS

## 2020-02-17 MED ORDER — CHLORHEXIDINE GLUCONATE 0.12 % MT SOLN
15.0000 mL | OROMUCOSAL | Status: AC
  Filled 2020-02-17: qty 15

## 2020-02-17 MED ORDER — THROMBIN 20000 UNITS EX SOLR: CUTANEOUS | Status: DC | PRN

## 2020-02-17 MED ORDER — FLEET ENEMA 7-19 GM/118ML RE ENEM
1.0000 | ENEMA | Freq: Once | RECTAL | Status: AC | PRN
  Administered 2020-02-28: 1 via RECTAL
  Filled 2020-02-17 (×2): qty 1

## 2020-02-17 MED ORDER — ONDANSETRON HCL 4 MG/2ML IJ SOLN: 4.0000 mg | INTRAMUSCULAR | Status: DC | PRN

## 2020-02-17 MED ORDER — DOCUSATE SODIUM 50 MG/5ML PO LIQD
100.0000 mg | Freq: Two times a day (BID) | ORAL | Status: DC
  Administered 2020-02-17 – 2020-03-26 (×50): 100 mg
  Filled 2020-02-17 (×56): qty 10

## 2020-02-17 MED ORDER — LIDOCAINE-EPINEPHRINE 1 %-1:100000 IJ SOLN
INTRAMUSCULAR | Status: DC | PRN
  Administered 2020-02-17: 10 mL

## 2020-02-17 MED ORDER — SODIUM CHLORIDE 0.9 % IV SOLN: INTRAVENOUS | Status: DC

## 2020-02-17 MED ORDER — ENALAPRILAT 1.25 MG/ML IV SOLN
1.2500 mg | Freq: Four times a day (QID) | INTRAVENOUS | Status: DC | PRN
  Administered 2020-02-23 – 2020-03-03 (×6): 1.25 mg via INTRAVENOUS
  Filled 2020-02-17 (×6): qty 1

## 2020-02-17 MED ORDER — CHLORHEXIDINE GLUCONATE 0.12% ORAL RINSE (MEDLINE KIT)
15.0000 mL | Freq: Two times a day (BID) | OROMUCOSAL | Status: DC
  Administered 2020-02-17 – 2020-03-03 (×30): 15 mL via OROMUCOSAL

## 2020-02-17 MED ORDER — ACETAMINOPHEN 325 MG PO TABS: 650.0000 mg | ORAL_TABLET | ORAL | Status: DC | PRN

## 2020-02-17 MED ORDER — PROMETHAZINE HCL 25 MG PO TABS
12.5000 mg | ORAL_TABLET | ORAL | Status: DC | PRN
  Filled 2020-02-17: qty 1

## 2020-02-17 MED ORDER — POLYETHYLENE GLYCOL 3350 17 G PO PACK: 17.0000 g | PACK | Freq: Every day | ORAL | Status: DC | PRN

## 2020-02-17 MED ORDER — FENTANYL CITRATE (PF) 100 MCG/2ML IJ SOLN
INTRAMUSCULAR | Status: DC | PRN
  Administered 2020-02-17 (×2): 100 ug via INTRAVENOUS
  Administered 2020-02-17: 50 ug via INTRAVENOUS

## 2020-02-17 MED ORDER — SODIUM CHLORIDE 0.9 % IV SOLN: INTRAVENOUS | Status: DC | PRN

## 2020-02-17 MED ORDER — NICARDIPINE HCL IN NACL 20-0.86 MG/200ML-% IV SOLN
3.0000 mg/h | INTRAVENOUS | Status: DC
  Administered 2020-02-23: 3 mg/h via INTRAVENOUS
  Administered 2020-02-23 (×2): 10 mg/h via INTRAVENOUS
  Filled 2020-02-17 (×3): qty 200

## 2020-02-17 MED ORDER — PHENYLEPHRINE 40 MCG/ML (10ML) SYRINGE FOR IV PUSH (FOR BLOOD PRESSURE SUPPORT)
PREFILLED_SYRINGE | INTRAVENOUS | Status: DC | PRN
  Administered 2020-02-17 (×2): 120 ug via INTRAVENOUS

## 2020-02-17 SURGICAL SUPPLY — 65 items
BAND INSRT 18 STRL LF DISP RB (MISCELLANEOUS) ×4
BAND RUBBER #18 3X1/16 STRL (MISCELLANEOUS) ×4 IMPLANT
BLADE CLIPPER SURG (BLADE) ×4 IMPLANT
BUR ACORN 9.0 PRECISION (BURR) ×3 IMPLANT
BUR ACORN 9.0MM PRECISION (BURR) ×1
BUR SPIRAL ROUTER 2.3 (BUR) ×3 IMPLANT
BUR SPIRAL ROUTER 2.3MM (BUR) ×1
CANISTER SUCT 3000ML PPV (MISCELLANEOUS) ×8 IMPLANT
CNTNR URN SCR LID CUP LEK RST (MISCELLANEOUS) ×2 IMPLANT
CONT SPEC 4OZ STRL OR WHT (MISCELLANEOUS) ×8
COVER BURR HOLE UNIV 10 (Orthopedic Implant) ×6 IMPLANT
DISP BIPOLAR PROBE CORTI STIMU (MISCELLANEOUS) ×4
DRAPE HALF SHEET 40X57 (DRAPES) ×6 IMPLANT
DRAPE MICROSCOPE LEICA (MISCELLANEOUS) ×2 IMPLANT
DRAPE NEUROLOGICAL W/INCISE (DRAPES) ×4 IMPLANT
DRAPE WARM FLUID 44X44 (DRAPES) ×4 IMPLANT
DRSG AQUACEL ADVANTAGE 4X5 (GAUZE/BANDAGES/DRESSINGS) ×2 IMPLANT
DURAPREP 6ML APPLICATOR 50/CS (WOUND CARE) ×4 IMPLANT
ELECT COATED BLADE 2.86 ST (ELECTRODE) ×4 IMPLANT
ELECT REM PT RETURN 9FT ADLT (ELECTROSURGICAL) ×4
ELECTRODE REM PT RTRN 9FT ADLT (ELECTROSURGICAL) ×2 IMPLANT
FEE INTRAOP MONITOR IMPULS NCS (MISCELLANEOUS) IMPLANT
FORCEPS BIPO MALIS IRRIG 9X1.5 (NEUROSURGERY SUPPLIES) ×4 IMPLANT
GLOVE BIO SURGEON STRL SZ7.5 (GLOVE) ×6 IMPLANT
GLOVE BIOGEL PI IND STRL 7.5 (GLOVE) ×2 IMPLANT
GLOVE BIOGEL PI INDICATOR 7.5 (GLOVE) ×4
GLOVE ECLIPSE 6.5 STRL STRAW (GLOVE) ×2 IMPLANT
GOWN STRL REUS W/ TWL LRG LVL3 (GOWN DISPOSABLE) ×4 IMPLANT
GOWN STRL REUS W/ TWL XL LVL3 (GOWN DISPOSABLE) IMPLANT
GOWN STRL REUS W/TWL 2XL LVL3 (GOWN DISPOSABLE) IMPLANT
GOWN STRL REUS W/TWL LRG LVL3 (GOWN DISPOSABLE) ×16
GOWN STRL REUS W/TWL XL LVL3 (GOWN DISPOSABLE) ×8
HEMOSTAT POWDER KIT SURGIFOAM (HEMOSTASIS) ×6 IMPLANT
HOOK DURA 1/2IN (MISCELLANEOUS) ×2 IMPLANT
INTRAOP MONITOR FEE IMPULS NCS (MISCELLANEOUS) ×2
INTRAOP MONITOR FEE IMPULSE (MISCELLANEOUS) ×4
IV NS 1000ML (IV SOLUTION) ×4
IV NS 1000ML BAXH (IV SOLUTION) ×2 IMPLANT
KIT BASIN OR (CUSTOM PROCEDURE TRAY) ×4 IMPLANT
KIT TURNOVER KIT B (KITS) ×4 IMPLANT
MARKER SPHERE PSV REFLC 13MM (MARKER) ×8 IMPLANT
NEEDLE HYPO 22GX1.5 SAFETY (NEEDLE) ×4 IMPLANT
NS IRRIG 1000ML POUR BTL (IV SOLUTION) ×12 IMPLANT
PACK CRANIOTOMY CUSTOM (CUSTOM PROCEDURE TRAY) ×4 IMPLANT
PIN MAYFIELD SKULL DISP (PIN) ×4 IMPLANT
PLATE BONE 12 2H TARGET XL (Plate) ×2 IMPLANT
PROBE BIPOLAR CORTI STIM NCS (MISCELLANEOUS) IMPLANT
PROBE NERVBE PRASS .33 (MISCELLANEOUS) ×2 IMPLANT
SCREW UNIII AXS SD 1.5X4 (Screw) ×28 IMPLANT
SET TUBING IRRIGATION DISP (TUBING) ×4 IMPLANT
SPONGE SURGIFOAM ABS GEL 100 (HEMOSTASIS) ×4 IMPLANT
STAPLER SKIN PROX 35W (STAPLE) ×2 IMPLANT
STAPLER VISISTAT 35W (STAPLE) ×4 IMPLANT
STRIP PLATINUM NCS 1X4 (MISCELLANEOUS) ×4
STRIP PLATINUM NCS 1X4 NCS (MISCELLANEOUS) IMPLANT
SUT MNCRL AB 3-0 PS2 18 (SUTURE) ×2 IMPLANT
SUT NURALON 4 0 TR CR/8 (SUTURE) ×8 IMPLANT
SUT VIC AB 0 CT1 18XCR BRD8 (SUTURE) IMPLANT
SUT VIC AB 0 CT1 8-18 (SUTURE) ×4
SUT VIC AB 2-0 CP2 18 (SUTURE) ×4 IMPLANT
TOWEL GREEN STERILE (TOWEL DISPOSABLE) ×4 IMPLANT
TOWEL GREEN STERILE FF (TOWEL DISPOSABLE) ×4 IMPLANT
TUBE CONNECTING 12'X1/4 (SUCTIONS) ×1
TUBE CONNECTING 12X1/4 (SUCTIONS) ×3 IMPLANT
WATER STERILE IRR 1000ML POUR (IV SOLUTION) ×4 IMPLANT

## 2020-02-17 NOTE — Progress Notes (Signed)
Subjective: Patient pulled out Foley overnight  Objective: Vital signs in last 24 hours: Temp:  [97.4 F (36.3 C)-98.5 F (36.9 C)] 97.4 F (36.3 C) (12/01 0800) Pulse Rate:  [48-80] 51 (12/01 1100) Resp:  [6-25] 6 (12/01 1100) BP: (91-181)/(54-91) 132/61 (12/01 1100) SpO2:  [91 %-100 %] 100 % (12/01 1100) Weight:  [140 kg] 140 kg (12/01 1152)  Intake/Output from previous day: 11/30 0701 - 12/01 0700 In: 3091.2 [I.V.:2771.2; IV Piggyback:320] Out: 3300 [Urine:3300] Intake/Output this shift: Total I/O In: 496.5 [I.V.:461.5; IV Piggyback:35] Out: 500 [Urine:500]  Eyes open to voice, but not oriented, hard to redirect, and very agitated. Moving all extremities x 4 symmetrically  Lab Results: Recent Labs    02/16/20 0507 02/17/20 0530  WBC 9.2 10.3  HGB 15.8 14.7  HCT 48.8 45.9  PLT 238 232   BMET Recent Labs    02/16/20 0507 02/17/20 0530  NA 135 141  K 3.3* 3.9  CL 107 109  CO2 22 24  GLUCOSE 151* 145*  BUN 19 19  CREATININE 0.54* 0.67  CALCIUM 6.9* 7.1*    Studies/Results: Portable EEG  Result Date: 02/15/2020 Lora Havens, MD     02/15/2020  4:26 PM Patient Name: Antonio Glenn MRN: [id number] Epilepsy Attending: Lora Havens Referring Physician/Provider: Dr Duffy Rhody Date: 02/15/2020 Duration: 23.29 mins Patient history: 59 yo M with left parietal brain tumor, recent seizure who has improving but persistent non-focal delirium/encephalopathy. EEG to evaluate for seizure Level of alertness: Awake, asleep AEDs during EEG study: LEV Technical aspects: This EEG study was done with scalp electrodes positioned according to the 10-20 International system of electrode placement. Electrical activity was acquired at a sampling rate of 500Hz  and reviewed with a high frequency filter of 70Hz  and a low frequency filter of 1Hz . EEG data were recorded continuously and digitally stored. Description: No posterior dominant rhythm was seen. Sleep was characterized  by vertex waves, sleep spindles (12 to 14 Hz), maximal frontocentral region. Sharp waves were noted in left temporal region. Continuous 3-5Hz  theta-delta slowing was also noted in left hemisphere, maximal left temporal region.  Hyperventilation and photic stimulation were not performed.    ABNORMALITY - Sharp waves,  left temporal region - Continuous slow, left hemisphere, maximal left temporal region.  IMPRESSION: This study showed evidence of epileptogenicity arising from left temporal region as well as cortical dysfunction in left hemisphere, maximal left temporal region region due to underlying mass.  No seizures were seen throughout the recording. If concern for seizures persist, please consider long term monitoring. Lora Havens    Assessment/Plan: 59 yo M with left parietal brain mass and persistent encephalopathy - planning for resection today for diagnostic and potential therapeutic benefit - will likely keep intubated after surgery overnight   Antonio Glenn 02/17/2020, 1:25 PM

## 2020-02-17 NOTE — Progress Notes (Signed)
eLink Physician-Brief Progress Note Patient Name: Antonio Glenn DOB: [birth year] MRN: [id number]   Date of Service  02/17/2020  HPI/Events of Note  Patient pulled Foley catheter out during an episode of severe agitation. Now bleeding from urethra. Patient was on Heparin [place] which has now stopped.   eICU Interventions  Plan: 1. PT/INR and PTT STAT.      Intervention Category Major Interventions: Other:  [name] 02/17/2020, 1:15 AM

## 2020-02-17 NOTE — Anesthesia Procedure Notes (Signed)
Procedure Name: Intubation Date/Time: 02/17/2020 2:25 PM Performed by: Colin Benton, CRNA Pre-anesthesia Checklist: Patient identified, Emergency Drugs available, Suction available and Patient being monitored Patient Re-evaluated:Patient Re-evaluated prior to induction Oxygen Delivery Method: Circle System Utilized Preoxygenation: Pre-oxygenation with 100% oxygen Induction Type: IV induction Ventilation: Mask ventilation without difficulty Laryngoscope Size: Mac and 4 Grade View: Grade I Tube type: Oral Tube size: 7.5 mm Number of attempts: 1 Airway Equipment and Method: Stylet Placement Confirmation: ETT inserted through vocal cords under direct vision,  positive ETCO2 and breath sounds checked- equal and bilateral Secured at: 24 cm Tube secured with: Tape Dental Injury: Teeth and Oropharynx as per pre-operative assessment  Comments: Performed by Sueanne Margarita, SRNA

## 2020-02-17 NOTE — Op Note (Signed)
Procedure(s): CRANIOTOMY OF  LEFT PARIETAL TUMOR APPLICATION OF CRANIAL NAVIGATION Procedure Note  ERMAN THUM male 59 y.o. 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  Surgical resection was recommended for both diagnostic and potential therapeutic benefit.  He and his family wished to proceed with surgery.  Unfortunately, several days prior to the planned surgical date, he developed seizures and subsequent profound encephalopathy which required hospitalization.  He remained very agitated, confused and hard to redirect.  His metabolic derangements were corrected but he still was very encephalopathic.  He did have a fair amount of epileptogenic spikes arising from his parietal lobe.  For purposes of possible seizure control, diagnostic benefit, and possible potential oncologic cytoreductive benefit, surgery was still recommended.  Risks, benefits, alternatives, and expected convalescence were discussed with the patient's daughter Alyson Ingles.  Risks discussed included but were not limited to bleeding, pain, infection, seizure, scars, stroke, neurologic deficit, tumor recurrence, coma, and death.  The patient's daughter wished to proceed with surgery.    Surgeon: [name]   Assistants: Ashok Pall, MD. no qualified trainees were available to assist.  Assistance was required for delicate retraction of neural structures.  Anesthesia: General endotracheal anesthesia  Procedure Detail 1.  Left parietal craniotomy for resection of tumor 2. use of  computer-assisted neuro navigation 3.  Motor mapping with phase reversal SSEPs and subcortical stimulation  CRANIOTOMY OF  LEFT PARIETAL TUMOR, APPLICATION OF CRANIAL NAVIGATION  Patient was brought to the operating room.  General anesthesia was induced and patient was intubated by the anesthesia service.  After appropriate lines and monitors were placed, patient was positioned supine with the shoulder bump and head turned to the right.  His head was placed in [place] head holder and affixed to the bed.  All pressure points back were padded and eyes were protected.  A preoperative thin cut CT was reconstructed into a 3D image and fused with a preoperative thin cut MRI.  This was used for surface match registration using the [place] system and subsequently allowed for intraoperative computer-assisted navigation.  Using the navigation, a curvilinear incision was planned over the area of planned craniotomy.  His scalp was clipped, preprepped with alcohol and prepped and draped in sterile fashion.  1% lidocaine with epinephrine was checked in the planned incision.  A timeout was performed.  Preoperative antibiotics, dexamethasone, and mannitol were given.  The incision was made with a 10 blade and the periosteum was swept off the skull.  Self-retaining retractors were used.  High-speed drill was used to perform 3 bur holes which were used to dissect the dura from the inner table skull.  Craniotome was used to perform craniotomy.  Meticulous epidural hemostasis was obtained.  The dura was opened in a C-shaped manner and reflected aside.  Navigation was employed to plan out a generous corticotomy to allow resection of this fairly extensive mass.  The patient had a fair amount of brain swelling so we refrained from cortical motor mapping with phase reversals until his  brain was more relaxed.  Corticotomy was made in the posterior parietal lobe with bipolar electrocautery followed by microscissors.  After opening the  parietal neocortex, the superficial tumor was encountered and was grayish to purplish and firm, but it appeared to blend in with the surrounding white matter which was firmer than normal.  A circumferential dissection was planned around the lesion using combination of bipolar emulsification as well as suction.  Small white matter feeders were coagulated and cut sharply.  Dissection continued deep into the subcortical white matter.  After the brain was significantly more relaxed, phase reversal SSEPs were performed by passing a 4 contact grid anteromedially from our dural opening over the central sulcus and identifying the phase reversal.  This was fairly remote from our area of resection.  The tumor appeared more extensive than was on his MRI from 2 weeks ago.  At the depths, subcortical motor mapping was performed to assure that we were not near the corticospinal tracts, and the tracts were not stimulating at less than 20 mA.  However, at the depth of the resection, there appeared to be tumor that was slightly discolored though not frankly gray or purple that was involving the superior longitudinal fasciculus based on the neuro navigation as well as the ependymal wall.  In order to minimize the morbidity of the surgery and to avoid entering the ventricle, this was left behind.  Part of the tumor specimen was sent for frozen section which returned as hypercellular lesional tissue.  The rest was sent for permanent specimen.  Meticulous hemostasis was obtained.  Following the resection, the brain was significantly relaxed.  The dura was then closed with 4-0 Nurolon in running fashion.  The bone flap was replaced with Stryker cranial plating system.  The skin was closed with 2-0 Vicryl stitches followed by staples and a sterile dressing.  Patient was then removed from [place] head holder and turned over to the anesthesia service.  Given his severe agitation which would result in potential self-harm as well as  significant fluctuations in blood pressure, it was safest to leave him intubated.  All counts were correct at the end of surgery.  No complications were noted.   Findings: Frozen section with hypercellular lesion  Estimated Blood Loss:  150 ml         Drains: none         Blood Given: none          Specimens:  Left parietal lobe mass          Implants: Stryker cranial plating system        Complications:  * No complications entered in OR log *         Disposition: ICU - intubated and critically ill.         Condition: stable

## 2020-02-17 NOTE — Transfer of Care (Signed)
Immediate Anesthesia Transfer of Care Note  Patient: Antonio Glenn  Procedure(s) Performed: CRANIOTOMY OF  LEFT PARIETAL TUMOR (Left Head) APPLICATION OF CRANIAL NAVIGATION (Left )  Patient Location: ICU  Anesthesia Type:General  Level of Consciousness: sedated and Patient remains intubated per anesthesia plan  Airway & Oxygen Therapy: Patient remains intubated per anesthesia plan and Patient placed on Ventilator (see vital sign flow sheet for setting)  Post-op Assessment: Report given to RN and Post -op Vital signs reviewed and stable  Post vital signs: Reviewed and stable  Last Vitals:  Vitals Value Taken Time  BP    Temp    Pulse    Resp    SpO2      Last Pain:  Vitals:   02/17/20 0800  TempSrc: Oral  PainSc:          Complications: No complications documented.

## 2020-02-17 NOTE — Progress Notes (Signed)
eLink Physician-Brief Progress Note Patient Name: Antonio Glenn DOB: 1960/03/30 MRN: [id number]   Date of Service  02/17/2020  HPI/Events of Note  INR = 1.2 and PTT = 27 both are normal.   eICU Interventions  Continue present management.      Intervention Category Major Interventions: Other:  Omya Winfield Cornelia Copa 02/17/2020, 4:07 AM

## 2020-02-17 NOTE — Progress Notes (Signed)
Cortrak Tube Team Note:  Consult received to replace a Cortrak feeding tube.   Patient pulled out previous Cortrak feeding tube. Discussed plan with team and plan is to hold off on replacement at this time. Patient now in OR at this time. Replacement can be attempted at next date of service.  Jacklynn Barnacle, MS, RD, LDN Pager number available on Amion

## 2020-02-18 ENCOUNTER — Inpatient Hospital Stay (HOSPITAL_COMMUNITY): Payer: BC Managed Care – PPO

## 2020-02-18 ENCOUNTER — Encounter (HOSPITAL_COMMUNITY): Payer: Self-pay | Admitting: Neurosurgery

## 2020-02-18 DIAGNOSIS — G9389 Other specified disorders of brain: Secondary | ICD-10-CM | POA: Diagnosis not present

## 2020-02-18 DIAGNOSIS — G9340 Encephalopathy, unspecified: Secondary | ICD-10-CM | POA: Diagnosis not present

## 2020-02-18 LAB — GLUCOSE, CAPILLARY
Glucose-Capillary: 152 mg/dL — ABNORMAL HIGH (ref 70–99)
Glucose-Capillary: 142 mg/dL — ABNORMAL HIGH (ref 70–99)
Glucose-Capillary: 176 mg/dL — ABNORMAL HIGH (ref 70–99)
Glucose-Capillary: 174 mg/dL — ABNORMAL HIGH (ref 70–99)
Glucose-Capillary: 138 mg/dL — ABNORMAL HIGH (ref 70–99)

## 2020-02-18 LAB — CBC WITH DIFFERENTIAL/PLATELET
Abs Immature Granulocytes: 0.08 10*3/uL — ABNORMAL HIGH (ref 0.00–0.07)
Basophils Absolute: 0 10*3/uL (ref 0.0–0.1)
Basophils Relative: 0 %
Eosinophils Absolute: 0 10*3/uL (ref 0.0–0.5)
Eosinophils Relative: 0 %
HCT: 46.9 % (ref 39.0–52.0)
Hemoglobin: 15.3 g/dL (ref 13.0–17.0)
Immature Granulocytes: 1 %
Lymphocytes Relative: 5 %
Lymphs Abs: 0.7 10*3/uL (ref 0.7–4.0)
MCH: 29.9 pg (ref 26.0–34.0)
MCHC: 32.6 g/dL (ref 30.0–36.0)
MCV: 91.6 fL (ref 80.0–100.0)
Monocytes Absolute: 1.3 10*3/uL — ABNORMAL HIGH (ref 0.1–1.0)
Monocytes Relative: 9 %
Neutro Abs: 12.4 10*3/uL — ABNORMAL HIGH (ref 1.7–7.7)
Neutrophils Relative %: 85 %
Platelets: 278 10*3/uL (ref 150–400)
RBC: 5.12 MIL/uL (ref 4.22–5.81)
RDW: 13.6 % (ref 11.5–15.5)
WBC: 14.5 10*3/uL — ABNORMAL HIGH (ref 4.0–10.5)
nRBC: 0 % (ref 0.0–0.2)

## 2020-02-18 LAB — BASIC METABOLIC PANEL
Anion gap: 13 (ref 5–15)
BUN: 24 mg/dL — ABNORMAL HIGH (ref 6–20)
CO2: 21 mmol/L — ABNORMAL LOW (ref 22–32)
Calcium: 8 mg/dL — ABNORMAL LOW (ref 8.9–10.3)
Chloride: 106 mmol/L (ref 98–111)
Creatinine, Ser: 0.97 mg/dL (ref 0.61–1.24)
GFR, Estimated: >60 mL/min (ref >60)
Glucose, Bld: 132 mg/dL — ABNORMAL HIGH (ref 70–99)
Potassium: 3.4 mmol/L — ABNORMAL LOW (ref 3.5–5.1)
Sodium: 140 mmol/L (ref 135–145)

## 2020-02-18 MED ORDER — PANTOPRAZOLE SODIUM 40 MG PO PACK
40.0000 mg | PACK | Freq: Every day | ORAL | Status: DC
  Administered 2020-02-18 – 2020-03-25 (×37): 40 mg
  Filled 2020-02-18 (×38): qty 20

## 2020-02-18 MED ORDER — FENTANYL 2500MCG IN NS 250ML (10MCG/ML) PREMIX INFUSION
0.0000 ug/h | INTRAVENOUS | Status: DC
  Administered 2020-02-18: 25 ug/h via INTRAVENOUS
  Administered 2020-02-19: 150 ug/h via INTRAVENOUS
  Administered 2020-02-19 – 2020-02-22 (×6): 200 ug/h via INTRAVENOUS
  Administered 2020-02-22: 300 ug/h via INTRAVENOUS
  Administered 2020-02-22: 200 ug/h via INTRAVENOUS
  Administered 2020-02-23: 300 ug/h via INTRAVENOUS
  Administered 2020-02-23 – 2020-02-24 (×4): 200 ug/h via INTRAVENOUS
  Administered 2020-02-25: 150 ug/h via INTRAVENOUS
  Administered 2020-02-26: 175 ug/h via INTRAVENOUS
  Administered 2020-02-27 – 2020-02-29 (×4): 200 ug/h via INTRAVENOUS
  Administered 2020-03-01: 125 ug/h via INTRAVENOUS
  Administered 2020-03-02: 100 ug/h via INTRAVENOUS
  Filled 2020-02-18 (×24): qty 250

## 2020-02-18 MED ORDER — MIDAZOLAM BOLUS VIA INFUSION
2.0000 mg | INTRAVENOUS | Status: DC | PRN
  Filled 2020-02-18: qty 2

## 2020-02-18 MED ORDER — FENTANYL BOLUS VIA INFUSION
50.0000 ug | INTRAVENOUS | Status: DC | PRN
  Administered 2020-02-21 – 2020-03-02 (×14): 50 ug via INTRAVENOUS
  Filled 2020-02-18: qty 50

## 2020-02-18 MED ORDER — GADOBUTROL 1 MMOL/ML IV SOLN
10.0000 mL | Freq: Once | INTRAVENOUS | Status: AC | PRN
  Administered 2020-02-18: 10 mL via INTRAVENOUS

## 2020-02-18 MED ORDER — CHLORHEXIDINE GLUCONATE CLOTH 2 % EX PADS
6.0000 | MEDICATED_PAD | Freq: Every day | CUTANEOUS | Status: DC
  Administered 2020-02-18 – 2020-04-12 (×41): 6 via TOPICAL

## 2020-02-18 MED ORDER — PHENOBARBITAL 60 MG/ML ORAL SUSPENSION: 60.0000 mg | Freq: Two times a day (BID) | ORAL | Status: DC

## 2020-02-18 MED ORDER — QUETIAPINE FUMARATE 100 MG PO TABS
100.0000 mg | ORAL_TABLET | Freq: Two times a day (BID) | ORAL | Status: DC
  Administered 2020-02-18 – 2020-02-24 (×13): 100 mg
  Filled 2020-02-18 (×13): qty 1

## 2020-02-18 MED ORDER — POTASSIUM CHLORIDE 10 MEQ/50ML IV SOLN
10.0000 meq | INTRAVENOUS | Status: AC
  Administered 2020-02-18 (×4): 10 meq via INTRAVENOUS
  Filled 2020-02-18 (×4): qty 50

## 2020-02-18 MED ORDER — PHENOBARBITAL 32.4 MG PO TABS
64.8000 mg | ORAL_TABLET | Freq: Two times a day (BID) | ORAL | Status: DC
  Administered 2020-02-18 – 2020-02-20 (×4): 64.8 mg via ORAL
  Filled 2020-02-18 (×3): qty 2
  Filled 2020-02-18: qty 1
  Filled 2020-02-18: qty 2

## 2020-02-18 MED ORDER — MIDAZOLAM 50MG/50ML (1MG/ML) PREMIX INFUSION
0.5000 mg/h | INTRAVENOUS | Status: DC
  Administered 2020-02-18: 6 mg/h via INTRAVENOUS
  Administered 2020-02-18: 1 mg/h via INTRAVENOUS
  Administered 2020-02-19: 7 mg/h via INTRAVENOUS
  Administered 2020-02-19: 6 mg/h via INTRAVENOUS
  Administered 2020-02-19: 5 mg/h via INTRAVENOUS
  Administered 2020-02-20 – 2020-02-21 (×7): 8 mg/h via INTRAVENOUS
  Administered 2020-02-22: 9 mg/h via INTRAVENOUS
  Administered 2020-02-22: 8 mg/h via INTRAVENOUS
  Administered 2020-02-22: 9 mg/h via INTRAVENOUS
  Administered 2020-02-23 (×2): 10 mg/h via INTRAVENOUS
  Administered 2020-02-23: 9 mg/h via INTRAVENOUS
  Administered 2020-02-23: 10 mg/h via INTRAVENOUS
  Administered 2020-02-24: 5.5 mg/h via INTRAVENOUS
  Administered 2020-02-24: 5 mg/h via INTRAVENOUS
  Administered 2020-02-24: 8 mg/h via INTRAVENOUS
  Administered 2020-02-25: 5 mg/h via INTRAVENOUS
  Filled 2020-02-18 (×27): qty 50

## 2020-02-18 MED FILL — Thrombin For Soln 5000 Unit: CUTANEOUS | Qty: 5000 | Status: AC

## 2020-02-18 NOTE — Progress Notes (Signed)
NAME:  Antonio Glenn, MRN:  [id number], DOB:  13-Jul-1960, LOS: 7 ADMISSION DATE:  02/11/2020, CONSULTATION DATE:  02/11/20 REFERRING MD:  Viona Gilmore  CHIEF COMPLAINT:  AMS / Seizures   Brief History   Antonio Glenn is a 59 y.o. male with hx of recently diagnosed brain tumor who was transferred from [place] ED to [facility] 11/25 with AMS and seizures.  He is scheduled for left craniotomy on Dec 1 with Dr. Marcello Moores.  Past Medical History  Brain Mass.  Significant Hospital Events   11/25 > admit.  Consults:  Neurosurgery  Procedures:  None.  Significant Diagnostic Tests:  CT head 11/25 > 6 - 7cm mass in left parietal lobe with areas of internal cystic necrosis.  Mild regional mass effect with no MLS.   EEG 11/25 >   Micro Data:  Flu 11/25 >  COVID 11/25 >   Antimicrobials:  None.   Interim history/subjective:  Calmer  Objective:  Blood pressure 126/68, pulse (!) 118, temperature 99.2 F (37.3 C), temperature source Axillary, resp. rate 16, height 6\' 3"  (1.905 m), weight (!) 138.3 kg, SpO2 97 %.    Vent Mode: PSV;CPAP FiO2 (%):  [40 %-100 %] 100 % Set Rate:  [18 bmp] 18 bmp Vt Set:  [670 mL] 670 mL PEEP:  [5 cmH20] 5 cmH20 Pressure Support:  [10 cmH20] 10 cmH20 Plateau Pressure:  [18 cmH20-22 cmH20] 22 cmH20   Intake/Output Summary (Last 24 hours) at 02/18/2020 1157 Last data filed at 02/18/2020 1000 Gross per 24 hour  Intake 4561.15 ml  Output 4700 ml  Net -138.85 ml   Filed Weights   02/17/20 0500 02/17/20 1152 02/18/20 0500  Weight: (!) 140 kg (!) 140 kg (!) 138.3 kg    Examination: General: Middle-aged morbidly obese male, lying on the bed, in restraints,  Neuro: Opens eyes with vocal stimuli, yells out but does not follow commands, moving all 4 extremities spontaneously.  Pupils 3 mm bilateral reactive to light HEENT: [place]/AT. Sclerae anicteric.  Moist mucous membranes EOMI. Cardiovascular: RRR, no M/R/G.  Lungs: Clear to auscultation bilaterally Abdomen: BS x 4,  soft, NT/ND.  Musculoskeletal: No gross deformities, no edema.  Skin: Intact, warm, no rashes.  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  Unclear whether mass is sole cause of agitation.  Possible component of withdrawal. -Scheduled phenobarbital. -Continue dexamethasone 10 mg every 6 hours  No more clinical seizures noted since he was admitted, EEG was negative for active seizures but he does have epileptogenic foci in left frontotemporal region -Continue Keppra 500 mg twice daily -Continue lorazepam as needed for seizure activity Patient will have tumor resection/craniotomy for a scheduled on 12/1 by Dr. Marcello Moores Patient remained in sinus rhythm, continue Cardizem Holding anticoagulation for stroke prophylaxis due to scheduled brain surgery in few days  For OR today.   Daily Goals Checklist  Pain/Anxiety/Delirium protocol (if indicated): Trial of Geodon, continue Precedex Neuro vitals: every 4 hours AED's: Keppra 500 twice daily VAP protocol (if indicated): Not intubated Respiratory support goals: Encourage cough and deep breathing Blood pressure target: Keep systolic blood pressure  less than 170 DVT prophylaxis: Mechanical prophylaxis Nutrition Status: Low nutritional risk we will place new core track tomorrow. GI prophylaxis: Pantoprazole 40 mg daily Fluid status goals: Allow autoregulation Urinary catheter: Condom catheter Central lines: None Glucose control: Phase 1 glycemic control Mobility/therapy needs: Bedrest, continues to  require restraints for patient safety Antibiotic de-escalation: On no antibiotic Home medication reconciliation: On hold Daily labs: CBC, BMP Code Status: Full Family Communication: Daughter updated today by neurosurgery. Disposition: ICU   Labs   CBC: Recent Labs  Lab 02/12/20 0052 02/12/20 0052 02/16/20 0507 02/17/20 0530 02/17/20 1533 02/17/20 1703 02/18/20 0520  WBC 11.0*  --  9.2 10.3  --   --  14.5*  NEUTROABS  --   --  8.1* 9.0*  --   --  12.4*  HGB 15.1   < > 15.8 14.7 16.0 16.0 15.3  HCT 44.8   < > 48.8 45.9 47.0 47.0 46.9  MCV 92.8  --  92.8 93.5  --   --  91.6  PLT 266  --  238 232  --   --  278   < > = values in this interval not displayed.   Basic Metabolic Panel: Recent Labs  Lab 02/12/20 0052 02/12/20 0052 02/15/20 1850 02/16/20 0507 02/16/20 1643 02/17/20 0530 02/17/20 1533 02/17/20 1703 02/18/20 0520  NA 134*   < >  --  135  --  141 137 137 140  K 4.0   < >  --  3.3*  --  3.9 3.5 3.5 3.4*  CL 103  --   --  107  --  109  --   --  106  CO2 20*  --   --  22  --  24  --   --  21*  GLUCOSE 141*  --   --  151*  --  145*  --   --  132*  BUN 13  --   --  19  --  19  --   --  24*  CREATININE 0.76  --   --  0.54*  --  0.67  --   --  0.97  CALCIUM 7.7*  --   --  6.9*  --  7.1*  --   --  8.0*  MG 2.3  --  2.3 2.0 2.3 2.1  --   --   --   PHOS 2.5  --  2.7 2.5 2.7 3.0  --   --   --    < > = values in this interval not displayed.   GFR: Estimated Creatinine Clearance: 121.4 mL/min (by C-G formula based on SCr of 0.97 mg/dL). Recent Labs  Lab 02/12/20 0052 02/16/20 0507 02/17/20 0530 02/18/20 0520  WBC 11.0* 9.2 10.3 14.5*   Liver Function Tests: No results for input(s): AST, ALT, ALKPHOS, BILITOT, PROT, ALBUMIN in the last 168 hours. No results for input(s): LIPASE, AMYLASE in the last 168 hours. No results for input(s): AMMONIA in the last 168 hours. ABG    Component Value Date/Time   PHART 7.371 02/17/2020 1703   PCO2ART 43.6 02/17/2020 1703    PO2ART 275 (H) 02/17/2020 1703   HCO3 25.3 02/17/2020 1703   TCO2 27 02/17/2020 1703   ACIDBASEDEF 1.0 02/17/2020 1533   O2SAT 100.0 02/17/2020 1703    Coagulation Profile: Recent Labs  Lab 02/17/20 0121  INR 1.2   Cardiac Enzymes: No results for input(s): CKTOTAL, CKMB, CKMBINDEX, TROPONINI in the last 168 hours. HbA1C: Hgb A1c  MFr Bld  Date/Time Value Ref Range Status  02/11/2020 08:11 PM 5.8 (H) 4.8 - 5.6 % Final    Comment:    (NOTE) Pre diabetes:          5.7%-6.4%  Diabetes:              >6.4%  Glycemic control for   <7.0% adults with diabetes    CBG: Recent Labs  Lab 02/17/20 1401 02/17/20 1925 02/17/20 2323 02/18/20 0315 02/18/20 0750  GLUCAP 130* 144* 97 152* 142*    CRITICAL CARE Performed by: Kipp Brood   Total critical care time: 35 minutes  Critical care time was exclusive of separately billable procedures and treating other patients.  Critical care was necessary to treat or prevent imminent or life-threatening deterioration.  Critical care was time spent personally by me on the following activities: development of treatment plan with patient and/or surrogate as well as nursing, discussions with consultants, evaluation of patient's response to treatment, examination of patient, obtaining history from patient or surrogate, ordering and performing treatments and interventions, ordering and review of laboratory studies, ordering and review of radiographic studies, pulse oximetry, re-evaluation of patient's condition and participation in multidisciplinary rounds.  Kipp Brood, MD [facility] ICU Physician [place]  Pager: [phone] Mobile: [phone] After hours: [phone].

## 2020-02-18 NOTE — Progress Notes (Signed)
RT assisted with transportation to MRI while pt was on full ventilatory support. Pt tolerated well with SVS.

## 2020-02-18 NOTE — Progress Notes (Signed)
NAME:  Antonio Glenn, MRN:  [id number], DOB:  September 29, 1960, LOS: 7 ADMISSION DATE:  02/11/2020, CONSULTATION DATE:  02/11/20 REFERRING MD:  Viona Gilmore  CHIEF COMPLAINT:  AMS / Seizures   Brief History   COURTLAND REAS is a 59 y.o. male with hx of recently diagnosed brain tumor who was transferred from [place] ED to [facility] 11/25 with AMS and seizures.  He is scheduled for left craniotomy on Dec 1 with Dr. Marcello Moores.  Past Medical History  Brain Mass.  Significant Hospital Events   11/25 > admit.  Consults:  Neurosurgery  Procedures:  None.  Significant Diagnostic Tests:  CT head 11/25 > 6 - 7cm mass in left parietal lobe with areas of internal cystic necrosis.  Mild regional mass effect with no MLS.   EEG 11/25 >   Micro Data:  Flu 11/25 >  COVID 11/25 >   Antimicrobials:  None.   Interim history/subjective:  Calmer today  Objective:  Blood pressure 126/68, pulse (!) 118, temperature 99.2 F (37.3 C), temperature source Axillary, resp. rate 16, height 6\' 3"  (1.905 m), weight (!) 138.3 kg, SpO2 97 %.    Vent Mode: PSV;CPAP FiO2 (%):  [40 %-100 %] 100 % Set Rate:  [18 bmp] 18 bmp Vt Set:  [670 mL] 670 mL PEEP:  [5 cmH20] 5 cmH20 Pressure Support:  [10 cmH20] 10 cmH20 Plateau Pressure:  [18 cmH20-22 cmH20] 22 cmH20   Intake/Output Summary (Last 24 hours) at 02/18/2020 1155 Last data filed at 02/18/2020 1000 Gross per 24 hour  Intake 4561.15 ml  Output 4700 ml  Net -138.85 ml   Filed Weights   02/17/20 0500 02/17/20 1152 02/18/20 0500  Weight: (!) 140 kg (!) 140 kg (!) 138.3 kg    Examination: General: Middle-aged morbidly obese male, lying on the bed, in restraints,  Neuro: Opens eyes with vocal stimuli, yells out but does not follow commands, moving all 4 extremities spontaneously.  Pupils 3 mm bilateral reactive to light HEENT: [place]/AT. Sclerae anicteric.  Moist mucous membranes EOMI. Cardiovascular: RRR, no M/R/G.  Lungs: Clear to auscultation bilaterally Abdomen: BS  x 4, soft, NT/ND.  Musculoskeletal: No gross deformities, no edema.  Skin: Intact, warm, no rashes.  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  Unclear whether mass is sole cause of agitation.  Possible component of withdrawal. -Scheduled phenobarbital. -Continue dexamethasone 10 mg every 6 hours  No more clinical seizures noted since he was admitted, EEG was negative for active seizures but he does have epileptogenic foci in left frontotemporal region -Continue Keppra 500 mg twice daily -Continue lorazepam as needed for seizure activity Patient will have tumor resection/craniotomy for a scheduled on 12/1 by Dr. Marcello Moores Patient remained in sinus rhythm, continue Cardizem Holding anticoagulation for stroke prophylaxis due to scheduled brain surgery in few days  For OR today.  Daily Goals Checklist  Pain/Anxiety/Delirium protocol (if indicated): Trial of Geodon, continue Precedex Neuro vitals: every 4 hours AED's: Keppra 500 twice daily VAP protocol (if indicated): Not intubated Respiratory support goals: Encourage cough and deep breathing Blood pressure target: Keep systolic blood pressure  less than 170 DVT prophylaxis: Mechanical prophylaxis Nutrition Status: Low nutritional risk we will place new core track tomorrow. GI prophylaxis: Pantoprazole 40 mg daily Fluid status goals: Allow autoregulation Urinary catheter: Condom catheter Central lines: None Glucose control: Phase 1 glycemic control Mobility/therapy needs: Bedrest, continues  to require restraints for patient safety Antibiotic de-escalation: On no antibiotic Home medication reconciliation: On hold Daily labs: CBC, BMP Code Status: Full Family Communication: Daughter updated today by neurosurgery. Disposition: ICU   Labs   CBC: Recent Labs  Lab 02/12/20 0052 02/12/20 0052 02/16/20 0507 02/17/20 0530 02/17/20 1533 02/17/20 1703 02/18/20 0520  WBC 11.0*  --  9.2 10.3  --   --  14.5*  NEUTROABS  --   --  8.1* 9.0*  --   --  12.4*  HGB 15.1   < > 15.8 14.7 16.0 16.0 15.3  HCT 44.8   < > 48.8 45.9 47.0 47.0 46.9  MCV 92.8  --  92.8 93.5  --   --  91.6  PLT 266  --  238 232  --   --  278   < > = values in this interval not displayed.   Basic Metabolic Panel: Recent Labs  Lab 02/12/20 0052 02/12/20 0052 02/15/20 1850 02/16/20 0507 02/16/20 1643 02/17/20 0530 02/17/20 1533 02/17/20 1703 02/18/20 0520  NA 134*   < >  --  135  --  141 137 137 140  K 4.0   < >  --  3.3*  --  3.9 3.5 3.5 3.4*  CL 103  --   --  107  --  109  --   --  106  CO2 20*  --   --  22  --  24  --   --  21*  GLUCOSE 141*  --   --  151*  --  145*  --   --  132*  BUN 13  --   --  19  --  19  --   --  24*  CREATININE 0.76  --   --  0.54*  --  0.67  --   --  0.97  CALCIUM 7.7*  --   --  6.9*  --  7.1*  --   --  8.0*  MG 2.3  --  2.3 2.0 2.3 2.1  --   --   --   PHOS 2.5  --  2.7 2.5 2.7 3.0  --   --   --    < > = values in this interval not displayed.   GFR: Estimated Creatinine Clearance: 121.4 mL/min (by C-G formula based on SCr of 0.97 mg/dL). Recent Labs  Lab 02/12/20 0052 02/16/20 0507 02/17/20 0530 02/18/20 0520  WBC 11.0* 9.2 10.3 14.5*   Liver Function Tests: No results for input(s): AST, ALT, ALKPHOS, BILITOT, PROT, ALBUMIN in the last 168 hours. No results for input(s): LIPASE, AMYLASE in the last 168 hours. No results for input(s): AMMONIA in the last 168 hours. ABG    Component Value Date/Time   PHART 7.371 02/17/2020 1703   PCO2ART 43.6 02/17/2020  1703   PO2ART 275 (H) 02/17/2020 1703   HCO3 25.3 02/17/2020 1703   TCO2 27 02/17/2020 1703   ACIDBASEDEF 1.0 02/17/2020 1533   O2SAT 100.0 02/17/2020 1703    Coagulation Profile: Recent Labs  Lab 02/17/20 0121  INR 1.2   Cardiac Enzymes: No results for input(s): CKTOTAL, CKMB, CKMBINDEX, TROPONINI in the last 168 hours. HbA1C: Hgb A1c  MFr Bld  Date/Time Value Ref Range Status  02/11/2020 08:11 PM 5.8 (H) 4.8 - 5.6 % Final    Comment:    (NOTE) Pre diabetes:          5.7%-6.4%  Diabetes:              >6.4%  Glycemic control for   <7.0% adults with diabetes    CBG: Recent Labs  Lab 02/17/20 1401 02/17/20 1925 02/17/20 2323 02/18/20 0315 02/18/20 0750  GLUCAP 130* 144* 97 152* 142*    CRITICAL CARE Performed by: Kipp Brood   Total critical care time: 35 minutes  Critical care time was exclusive of separately billable procedures and treating other patients.  Critical care was necessary to treat or prevent imminent or life-threatening deterioration.  Critical care was time spent personally by me on the following activities: development of treatment plan with patient and/or surrogate as well as nursing, discussions with consultants, evaluation of patient's response to treatment, examination of patient, obtaining history from patient or surrogate, ordering and performing treatments and interventions, ordering and review of laboratory studies, ordering and review of radiographic studies, pulse oximetry, re-evaluation of patient's condition and participation in multidisciplinary rounds.  Kipp Brood, MD [facility] ICU Physician [place]  Pager: [phone] Mobile: [phone] After hours: [phone].

## 2020-02-18 NOTE — Progress Notes (Addendum)
Subjective: Status post surgery yesterday.  Still on propofol.  ROS: Unable to obtain due to poor mental status  Examination  Vital signs in last 24 hours: Temp:  [97.6 F (36.4 C)-99.2 F (37.3 C)] 99.2 F (37.3 C) (12/02 0800) Pulse Rate:  [48-131] 127 (12/02 0829) Resp:  [6-29] 16 (12/02 0829) BP: (89-167)/(43-77) 125/66 (12/02 0829) SpO2:  [94 %-100 %] 94 % (12/02 0829) Arterial Line BP: (91-158)/(46-73) 128/69 (12/02 0706) FiO2 (%):  [40 %-50 %] 40 % (12/02 0829) Weight:  [138.3 kg-140 kg] 138.3 kg (12/02 0500)  General: lying in bed, not in apparent distress CVS: pulse-normal rate and rhythm RS: breathing comfortably, intubated Extremities: normal, warm  Neuro: @20mcg /hr, does not open eyes to noxious stimuli, does not follow commands, PERRLA, corneal reflex intact, gag reflex intact, withdraws all 4 extremities to noxious stimuli  Basic Metabolic Panel: Recent Labs  Lab 02/12/20 0052 02/12/20 0052 02/15/20 1850 02/16/20 0507 02/16/20 1643 02/17/20 0530 02/17/20 1533 02/17/20 1703 02/18/20 0520  NA 134*   < >  --  135  --  141 137 137 140  K 4.0   < >  --  3.3*  --  3.9 3.5 3.5 3.4*  CL 103  --   --  107  --  109  --   --  106  CO2 20*  --   --  22  --  24  --   --  21*  GLUCOSE 141*  --   --  151*  --  145*  --   --  132*  BUN 13  --   --  19  --  19  --   --  24*  CREATININE 0.76  --   --  0.54*  --  0.67  --   --  0.97  CALCIUM 7.7*   < >  --  6.9*  --  7.1*  --   --  8.0*  MG 2.3  --  2.3 2.0 2.3 2.1  --   --   --   PHOS 2.5  --  2.7 2.5 2.7 3.0  --   --   --    < > = values in this interval not displayed.    CBC: Recent Labs  Lab 02/12/20 0052 02/12/20 0052 02/16/20 0507 02/17/20 0530 02/17/20 1533 02/17/20 1703 02/18/20 0520  WBC 11.0*  --  9.2 10.3  --   --  14.5*  NEUTROABS  --   --  8.1* 9.0*  --   --  12.4*  HGB 15.1   < > 15.8 14.7 16.0 16.0 15.3  HCT 44.8   < > 48.8 45.9 47.0 47.0 46.9  MCV 92.8  --  92.8 93.5  --   --  91.6  PLT 266   --  238 232  --   --  278   < > = values in this interval not displayed.     Coagulation Studies: Recent Labs    02/17/20 0121  LABPROT 14.6  INR 1.2    Imaging CT head w and wo contrast 02/12/2020: No substantial change in the left parietal mass, which was better characterized on recent MRI and is concerning for high-grade primary CNS neoplasm. No progressive mass effect or evidence of superimposed acute intracranial abnormality.  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   Recommendations: -Patient was on Keppra which was switched to lacosamide 100 mg twice daily by neurosurgery due to persistent sharp waves on EEG.  Continue lacosamide 100 mg twice daily -Discussed with Dr. Doyne Keel and patient's RN at bedside.  It seems that patient's agitation worsened after he was started on steroids.  It might be reasonable to do a quick steroid taper and see if that helps with mental status after sedation is weaned off. -Can consider EEG if patient is noted to have seizure-like episodes or has persistent altered mental status, although low suspicion at this point -Continue seizure precautions -Management of rest of comorbidities per primary team  I have spent a total of  35 minutes with the patient reviewing hospital notes,  test results, labs and examining the patient as well as establishing an assessment and plan.  > 50% of time was spent in direct patient care.    Zeb Comfort Epilepsy [facility] For questions after 5pm please refer to AMION to reach the Neurologist on call

## 2020-02-18 NOTE — Progress Notes (Signed)
NAME:  Antonio Glenn, MRN:  [id number], DOB:  01-31-1961, LOS: 7 ADMISSION DATE:  02/11/2020, CONSULTATION DATE:  02/11/20 REFERRING MD:  Viona Gilmore  CHIEF COMPLAINT:  AMS / Seizures   Brief History   Antonio Glenn is a 59 y.o. male with hx of recently diagnosed brain tumor who was transferred from [facility] ED to [facility] 11/25 with AMS and seizures.  He is scheduled for left craniotomy on Dec 1 with Dr. Marcello Moores.  Past Medical History  Brain Mass.  Significant Hospital Events   11/25 > admit. 12/1 > resection of left parietal tumor Consults:  Neurosurgery  Procedures:  None.  Significant Diagnostic Tests:  CT head 11/25 > 6 - 7cm mass in left parietal lobe with areas of internal cystic necrosis.  Mild regional mass effect with no MLS.   EEG 11/25 > no seizure MRI post-op 12/2 >  Expected post-operative changes.  Micro Data:  Flu 11/25 >  COVID 11/25 >   Antimicrobials:  None.   Interim history/subjective:  Tumor resection yesterday. Returned intubated.   Objective:  Blood pressure 126/68, pulse (!) 118, temperature 99.2 F (37.3 C), temperature source Axillary, resp. rate 16, height 6\' 3"  (1.905 m), weight (!) 138.3 kg, SpO2 97 %.    Vent Mode: PSV;CPAP FiO2 (%):  [40 %-100 %] 100 % Set Rate:  [18 bmp] 18 bmp Vt Set:  [670 mL] 670 mL PEEP:  [5 cmH20] 5 cmH20 Pressure Support:  [10 cmH20] 10 cmH20 Plateau Pressure:  [18 cmH20-22 cmH20] 22 cmH20   Intake/Output Summary (Last 24 hours) at 02/18/2020 1157 Last data filed at 02/18/2020 1000 Gross per 24 hour  Intake 4561.15 ml  Output 4700 ml  Net -138.85 ml   Filed Weights   02/17/20 0500 02/17/20 1152 02/18/20 0500  Weight: (!) 140 kg (!) 140 kg (!) 138.3 kg    Examination: General: Middle-aged morbidly obese male, lying on the bed, in restraints,  Neuro: Sedated. Moves all limbs to stimulation.  HEENT: OGT and ETT in place.  Cardiovascular: RRR, no M/R/G.  Lungs: Clear to auscultation bilaterally Abdomen: BS x 4,  soft, NT/ND.  Musculoskeletal: No gross deformities, no edema.  Skin: Intact, warm, no rashes.  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   Daily Goals Checklist  Pain/Anxiety/Delirium protocol (if indicated): seroquel and phenobarb. Propofol and fentanyl - wean iv once Cortrak in  Neuro vitals: every 4 hours AED's: Keppra 500 twice daily VAP protocol (if indicated):bundle in place Respiratory support goals: Wean once Cortrak in place. Blood pressure target: Keep systolic blood pressure less than 170 DVT prophylaxis: Mechanical prophylaxis Nutrition Status: Low nutritional risk -cortrak today.  GI prophylaxis: Pantoprazole 40 mg daily Fluid status goals: Allow autoregulation Urinary catheter: Foley catheter.  Central lines: None Glucose control: Phase 1 glycemic control Mobility/therapy needs: Bedrest, continues to require restraints for patient safety Antibiotic de-escalation: On no antibiotic Home medication reconciliation: On hold Daily labs: CBC, BMP Code Status: Full Family Communication: Daughter updated today by neurosurgery. Disposition: ICU   Labs   CBC: Recent Labs  Lab 02/12/20  7517 02/12/20 0052 02/16/20 0507 02/17/20 0530 02/17/20 1533 02/17/20 1703 02/18/20 0520  WBC 11.0*  --  9.2 10.3  --   --  14.5*  NEUTROABS  --   --  8.1* 9.0*  --   --  12.4*  HGB 15.1   < > 15.8 14.7 16.0 16.0 15.3  HCT 44.8   < > 48.8 45.9 47.0 47.0 46.9  MCV 92.8  --  92.8 93.5  --   --   91.6  PLT 266  --  238 232  --   --  278   < > = values in this interval not displayed.   Basic Metabolic Panel: Recent Labs  Lab 02/12/20 0052 02/12/20 0052 02/15/20 1850 02/16/20 0507 02/16/20 1643 02/17/20 0530 02/17/20 1533 02/17/20 1703 02/18/20 0520  NA 134*   < >  --  135  --  141 137 137 140  K 4.0   < >  --  3.3*  --  3.9 3.5 3.5 3.4*  CL 103  --   --  107  --  109  --   --  106  CO2 20*  --   --  22  --  24  --   --  21*  GLUCOSE 141*  --   --  151*  --  145*  --   --  132*  BUN 13  --   --  19  --  19  --   --  24*  CREATININE 0.76  --   --  0.54*  --  0.67  --   --  0.97  CALCIUM 7.7*  --   --  6.9*  --  7.1*  --   --  8.0*  MG 2.3  --  2.3 2.0 2.3 2.1  --   --   --   PHOS 2.5  --  2.7 2.5 2.7 3.0  --   --   --    < > = values in this interval not displayed.   GFR: Estimated Creatinine Clearance: 121.4 mL/min (by C-G formula based on SCr of 0.97 mg/dL). Recent Labs  Lab 02/12/20 0052 02/16/20 0507 02/17/20 0530 02/18/20 0520  WBC 11.0* 9.2 10.3 14.5*   Liver Function Tests: No results for input(s): AST, ALT, ALKPHOS, BILITOT, PROT, ALBUMIN in the last 168 hours. No results for input(s): LIPASE, AMYLASE in the last 168 hours. No results for input(s): AMMONIA in the last 168 hours. ABG    Component Value Date/Time   PHART 7.371 02/17/2020 1703   PCO2ART 43.6 02/17/2020 1703   PO2ART 275 (H) 02/17/2020 1703   HCO3 25.3 02/17/2020 1703   TCO2 27 02/17/2020 1703   ACIDBASEDEF 1.0 02/17/2020 1533   O2SAT 100.0 02/17/2020 1703    Coagulation Profile: Recent Labs  Lab 02/17/20 0121  INR 1.2   Cardiac Enzymes: No results for input(s): CKTOTAL, CKMB, CKMBINDEX, TROPONINI in the last 168 hours. HbA1C: Hgb A1c MFr Bld  Date/Time Value Ref Range Status  02/11/2020 08:11 PM 5.8 (H) 4.8 - 5.6 % Final    Comment:    (NOTE) Pre diabetes:          5.7%-6.4%  Diabetes:              >6.4%  Glycemic control for   <7.0% adults with diabetes     CBG: Recent Labs  Lab 02/17/20 1401 02/17/20 1925 02/17/20 2323 02/18/20 0315 02/18/20 0750  GLUCAP 130* 144* 97 152* 142*  CRITICAL CARE Performed by: Kipp Brood   Total critical care time: 35 minutes  Critical care time was exclusive of separately billable procedures and treating other patients.  Critical care was necessary to treat or prevent imminent or life-threatening deterioration.  Critical care was time spent personally by me on the following activities: development of treatment plan with patient and/or surrogate as well as nursing, discussions with consultants, evaluation of patient's response to treatment, examination of patient, obtaining history from patient or surrogate, ordering and performing treatments and interventions, ordering and review of laboratory studies, ordering and review of radiographic studies, pulse oximetry, re-evaluation of patient's condition and participation in multidisciplinary rounds.  Kipp Brood, MD [facility] ICU Physician [place]  Pager: [phone] Mobile: [phone] After hours: [phone].

## 2020-02-18 NOTE — Progress Notes (Signed)
Subjective: Patient returned from MRI  Objective: Vital signs in last 24 hours: Temp:  [97.6 F (36.4 C)-99.2 F (37.3 C)] 99.2 F (37.3 C) (12/02 0800) Pulse Rate:  [59-131] 118 (12/02 1052) Resp:  [14-29] 16 (12/02 1000) BP: (89-167)/(43-77) 126/68 (12/02 1052) SpO2:  [92 %-100 %] 97 % (12/02 1052) Arterial Line BP: (91-158)/(46-73) 128/69 (12/02 0706) FiO2 (%):  [40 %-100 %] 100 % (12/02 1052) Weight:  [138.3 kg] 138.3 kg (12/02 0500)  Intake/Output from previous day: 12/01 0701 - 12/02 0700 In: 4631.6 [I.V.:2956.5; NG/GT:625; IV Piggyback:1050.1] Out: 4700 [Urine:4500; Blood:200] Intake/Output this shift: Total I/O In: 426.1 [I.V.:147.1; NG/GT:180.6; IV Piggyback:98.4] Out: -   Dressing c/d Intubated, sedated PERRL.  Minimal movement to stim, eyes closed.   Lab Results: Recent Labs    02/17/20 0530 02/17/20 1533 02/17/20 1703 02/18/20 0520  WBC 10.3  --   --  14.5*  HGB 14.7   < > 16.0 15.3  HCT 45.9   < > 47.0 46.9  PLT 232  --   --  278   < > = values in this interval not displayed.   BMET Recent Labs    02/17/20 0530 02/17/20 1533 02/17/20 1703 02/18/20 0520  NA 141   < > 137 140  K 3.9   < > 3.5 3.4*  CL 109  --   --  106  CO2 24  --   --  21*  GLUCOSE 145*  --   --  132*  BUN 19  --   --  24*  CREATININE 0.67  --   --  0.97  CALCIUM 7.1*  --   --  8.0*   < > = values in this interval not displayed.    Studies/Results: MR BRAIN W WO CONTRAST  Result Date: 02/18/2020 CLINICAL DATA:  Brain mass post resection EXAM: MRI HEAD WITHOUT AND WITH CONTRAST TECHNIQUE: Multiplanar, multiecho pulse sequences of the brain and surrounding structures were obtained without and with intravenous contrast. CONTRAST:  60mL GADAVIST GADOBUTROL 1 MMOL/ML IV SOLN COMPARISON:  01/30/2020 FINDINGS: Brain: There are new postoperative changes of left parietotemporal mass resection with a resection cavity containing fluid, blood products, and minimal air. There is mild  surrounding diffusion restriction likely reflecting postoperative contusion. Additional pneumocephalus is present primarily along the left frontal convexity. Contrast enhanced imaging is degraded by motion artifact. There is minimal enhancement at the resection cavity margins. Extent of T2 FLAIR hyperintensity remains unchanged. There is persistent partial effacement of the posterior left lateral ventricle. There is additional mild diffusion and T2 hyperintensity without ADC hypointensity along the left hippocampus and posteromedial left thalamus. Vascular: Major vessel flow voids at the skull base are preserved. Skull and upper cervical spine: Left parietal craniotomy. Normal marrow signal is preserved. Sinuses/Orbits: Paranasal sinuses are aerated. Orbits are unremarkable. Other: Sella is unremarkable.  Mastoid air cells are clear. IMPRESSION: Expected postoperative changes post gross total resection of enhancing component of left parietotemporal mass. Minimal enhancement at the resection cavity margins is probably postoperative but small volume residual tumor is difficult to exclude due to irregular appearance of the preoperative mass. T2 hyperintensity along the left hippocampus and posteromedial left thalamus. May be related to recent seizure activity, postsurgical, or rapid progression of infiltrating tumor. Electronically Signed   By: Macy Mis M.D.   On: 02/18/2020 11:56   DG CHEST PORT 1 VIEW  Result Date: 02/18/2020 CLINICAL DATA:  Endotracheal tube. EXAM: PORTABLE CHEST 1 VIEW COMPARISON:  February 12, 2020. FINDINGS: Endotracheal and nasogastric tubes are in good position. Left-sided PICC line is noted with tip in expected position of the SVC. No pneumothorax or pleural effusion is noted. Minimal bibasilar subsegmental atelectasis is noted. There is increased prominence involving the right paramediastinal and perihilar regions since prior exam. Bony thorax is unremarkable. IMPRESSION:  Endotracheal and nasogastric tubes in good position. Increased prominence involving right paramediastinal and perihilar regions since prior exam. CT scan of the chest with Intravenous contrast is recommended for further evaluation. These results will be called to the ordering clinician or representative by the Radiologist Assistant, and communication documented in the PACS or zVision Dashboard. Electronically Signed   By: Marijo Conception M.D.   On: 02/18/2020 09:48   DG Abd Portable 1V  Result Date: 02/17/2020 CLINICAL DATA:  Check gastric catheter placement EXAM: PORTABLE ABDOMEN - 1 VIEW COMPARISON:  None. FINDINGS: Gastric catheter is noted within the distal stomach. Scattered large and small bowel gas is noted. No free air is seen. IMPRESSION: Gastric catheter within the stomach. Electronically Signed   By: Inez Catalina M.D.   On: 02/17/2020 22:27    Assessment/Plan: S/p resection of left parietal lobe tumor POD#1 - MRI showed gross total resection, though concern intraoperatively for non-enhancing infiltrative spread of tumor - I suspect the left mesial temporal lobe abnormality is related to recent seizures.  - will lessen sedation this afternoon.  If he is not following commands, should consider continuous EEG to r/o nonconvulsive status    [name] 02/18/2020, 12:01 PM

## 2020-02-18 NOTE — Progress Notes (Signed)
While settling patient from MRI trip and after administering seroquel per tube, this RN noticed SBP on the arterial line in the 80s with a map in the 50s.  Turned off sedation and placed bed in trendelenburg while contacting Dr. Lynetta Mare with CCM.  Dr. Loni Muse recommended going by cuff which read SBP in 100s with map in the 70s.  A few moments later, cuff pressure dropped to 70s/40s with map in the 50s.  Paged Dr. Loni Muse again who gave VO for 500cc bolus of NS.  Cuff and a-line have both trended upward. Restarted propofol at 95mcg/kg/min and fentanyl at 5mcg/hr for patient comfort.  Will continue to monitor.

## 2020-02-19 ENCOUNTER — Inpatient Hospital Stay (HOSPITAL_COMMUNITY): Payer: BC Managed Care – PPO

## 2020-02-19 ENCOUNTER — Encounter (HOSPITAL_COMMUNITY): Payer: Self-pay | Admitting: Neurosurgery

## 2020-02-19 DIAGNOSIS — J9601 Acute respiratory failure with hypoxia: Secondary | ICD-10-CM

## 2020-02-19 DIAGNOSIS — G9340 Encephalopathy, unspecified: Secondary | ICD-10-CM | POA: Diagnosis not present

## 2020-02-19 DIAGNOSIS — R4182 Altered mental status, unspecified: Secondary | ICD-10-CM | POA: Diagnosis not present

## 2020-02-19 LAB — CBC WITH DIFFERENTIAL/PLATELET
Abs Immature Granulocytes: 0.12 10*3/uL — ABNORMAL HIGH (ref 0.00–0.07)
Basophils Absolute: 0 10*3/uL (ref 0.0–0.1)
Basophils Relative: 0 %
Eosinophils Absolute: 0 10*3/uL (ref 0.0–0.5)
Eosinophils Relative: 0 %
HCT: 40.1 % (ref 39.0–52.0)
Hemoglobin: 13.6 g/dL (ref 13.0–17.0)
Immature Granulocytes: 1 %
Lymphocytes Relative: 5 %
Lymphs Abs: 0.5 10*3/uL — ABNORMAL LOW (ref 0.7–4.0)
MCH: 31.1 pg (ref 26.0–34.0)
MCHC: 33.9 g/dL (ref 30.0–36.0)
MCV: 91.6 fL (ref 80.0–100.0)
Monocytes Absolute: 0.8 10*3/uL (ref 0.1–1.0)
Monocytes Relative: 8 %
Neutro Abs: 8.7 10*3/uL — ABNORMAL HIGH (ref 1.7–7.7)
Neutrophils Relative %: 86 %
Platelets: 209 10*3/uL (ref 150–400)
RBC: 4.38 MIL/uL (ref 4.22–5.81)
RDW: 13.9 % (ref 11.5–15.5)
WBC: 10.1 10*3/uL (ref 4.0–10.5)
nRBC: 0 % (ref 0.0–0.2)

## 2020-02-19 LAB — GLUCOSE, CAPILLARY
Glucose-Capillary: 141 mg/dL — ABNORMAL HIGH (ref 70–99)
Glucose-Capillary: 148 mg/dL — ABNORMAL HIGH (ref 70–99)
Glucose-Capillary: 115 mg/dL — ABNORMAL HIGH (ref 70–99)
Glucose-Capillary: 116 mg/dL — ABNORMAL HIGH (ref 70–99)
Glucose-Capillary: 139 mg/dL — ABNORMAL HIGH (ref 70–99)
Glucose-Capillary: 130 mg/dL — ABNORMAL HIGH (ref 70–99)

## 2020-02-19 MED ORDER — OSMOLITE 1.5 CAL PO LIQD
1000.0000 mL | ORAL | Status: DC
  Administered 2020-02-20 – 2020-03-14 (×21): 1000 mL
  Filled 2020-02-19 (×10): qty 1000

## 2020-02-19 MED ORDER — DEXAMETHASONE SODIUM PHOSPHATE 4 MG/ML IJ SOLN
2.0000 mg | Freq: Four times a day (QID) | INTRAMUSCULAR | Status: DC
  Administered 2020-02-19 – 2020-02-22 (×11): 2 mg via INTRAVENOUS
  Filled 2020-02-19 (×11): qty 1

## 2020-02-19 MED ORDER — OSMOLITE 1.5 CAL PO LIQD
1000.0000 mL | ORAL | Status: DC
  Administered 2020-02-19: 1000 mL

## 2020-02-19 MED ORDER — PROSOURCE TF PO LIQD
90.0000 mL | Freq: Three times a day (TID) | ORAL | Status: DC
  Administered 2020-02-19 – 2020-03-22 (×96): 90 mL
  Filled 2020-02-19 (×97): qty 90

## 2020-02-19 MED ORDER — PROSOURCE TF PO LIQD
45.0000 mL | Freq: Three times a day (TID) | ORAL | Status: DC
  Administered 2020-02-19: 45 mL
  Filled 2020-02-19: qty 45

## 2020-02-19 NOTE — Progress Notes (Signed)
Subjective: Patient was very agitation with sedation weaning  Objective: Vital signs in last 24 hours: Temp:  [97.6 F (36.4 C)-99.3 F (37.4 C)] 97.6 F (36.4 C) (12/03 0800) Pulse Rate:  [76-130] 81 (12/03 0743) Resp:  [14-24] 24 (12/03 0743) BP: (90-170)/(52-86) 170/86 (12/03 0743) SpO2:  [92 %-100 %] 100 % (12/03 0743) Arterial Line BP: (83-158)/(44-81) 108/61 (12/03 0700) FiO2 (%):  [40 %-100 %] 40 % (12/03 0755) Weight:  [140.3 kg] 140.3 kg (12/03 0400)  Intake/Output from previous day: 12/02 0701 - 12/03 0700 In: 2303.6 [I.V.:1373.5; NG/GT:660; IV Piggyback:270] Out: 1125 [Urine:1125] Intake/Output this shift: No intake/output data recorded.  Incision dressing c/d Currently sedated on propofol and Fentanyl MAEs x 4 purposefully  Lab Results: Recent Labs    02/18/20 0520 02/19/20 0450  WBC 14.5* 10.1  HGB 15.3 13.6  HCT 46.9 40.1  PLT 278 209   BMET Recent Labs    02/17/20 0530 02/17/20 1533 02/17/20 1703 02/18/20 0520  NA 141   < > 137 140  K 3.9   < > 3.5 3.4*  CL 109  --   --  106  CO2 24  --   --  21*  GLUCOSE 145*  --   --  132*  BUN 19  --   --  24*  CREATININE 0.67  --   --  0.97  CALCIUM 7.1*  --   --  8.0*   < > = values in this interval not displayed.    Studies/Results: MR BRAIN W WO CONTRAST  Result Date: 02/18/2020 CLINICAL DATA:  Brain mass post resection EXAM: MRI HEAD WITHOUT AND WITH CONTRAST TECHNIQUE: Multiplanar, multiecho pulse sequences of the brain and surrounding structures were obtained without and with intravenous contrast. CONTRAST:  57mL GADAVIST GADOBUTROL 1 MMOL/ML IV SOLN COMPARISON:  01/30/2020 FINDINGS: Brain: There are new postoperative changes of left parietotemporal mass resection with a resection cavity containing fluid, blood products, and minimal air. There is mild surrounding diffusion restriction likely reflecting postoperative contusion. Additional pneumocephalus is present primarily along the left frontal  convexity. Contrast enhanced imaging is degraded by motion artifact. There is minimal enhancement at the resection cavity margins. Extent of T2 FLAIR hyperintensity remains unchanged. There is persistent partial effacement of the posterior left lateral ventricle. There is additional mild diffusion and T2 hyperintensity without ADC hypointensity along the left hippocampus and posteromedial left thalamus. Vascular: Major vessel flow voids at the skull base are preserved. Skull and upper cervical spine: Left parietal craniotomy. Normal marrow signal is preserved. Sinuses/Orbits: Paranasal sinuses are aerated. Orbits are unremarkable. Other: Sella is unremarkable.  Mastoid air cells are clear. IMPRESSION: Expected postoperative changes post gross total resection of enhancing component of left parietotemporal mass. Minimal enhancement at the resection cavity margins is probably postoperative but small volume residual tumor is difficult to exclude due to irregular appearance of the preoperative mass. T2 hyperintensity along the left hippocampus and posteromedial left thalamus. May be related to recent seizure activity, postsurgical, or rapid progression of infiltrating tumor. Electronically Signed   By: Macy Mis M.D.   On: 02/18/2020 11:56   DG CHEST PORT 1 VIEW  Result Date: 02/18/2020 CLINICAL DATA:  Endotracheal tube. EXAM: PORTABLE CHEST 1 VIEW COMPARISON:  February 12, 2020. FINDINGS: Endotracheal and nasogastric tubes are in good position. Left-sided PICC line is noted with tip in expected position of the SVC. No pneumothorax or pleural effusion is noted. Minimal bibasilar subsegmental atelectasis is noted. There is increased prominence involving the  right paramediastinal and perihilar regions since prior exam. Bony thorax is unremarkable. IMPRESSION: Endotracheal and nasogastric tubes in good position. Increased prominence involving right paramediastinal and perihilar regions since prior exam. CT scan of  the chest with Intravenous contrast is recommended for further evaluation. These results will be called to the ordering clinician or representative by the Radiologist Assistant, and communication documented in the PACS or zVision Dashboard. Electronically Signed   By: Marijo Conception M.D.   On: 02/18/2020 09:48   DG Abd Portable 1V  Result Date: 02/17/2020 CLINICAL DATA:  Check gastric catheter placement EXAM: PORTABLE ABDOMEN - 1 VIEW COMPARISON:  None. FINDINGS: Gastric catheter is noted within the distal stomach. Scattered large and small bowel gas is noted. No free air is seen. IMPRESSION: Gastric catheter within the stomach. Electronically Signed   By: Inez Catalina M.D.   On: 02/17/2020 22:27    Assessment/Plan: 59 yo M with L parietal tumor and severe encephalopathy, POD#2 s/p resection - while patient is still intubated, would recommend continuous EEG given subtle left parahippocampal/hippocampal T2 hyperintensity suggestive of recent seizures (this signal was not apparent 3 weeks ago, so new tumor infiltration in this area would be less likely) - at some point, patient will likely need extubation trial, though if he is not redirectable, behavioral control may be challenging - will decrease steroids   [name] 02/19/2020, 8:58 AM

## 2020-02-19 NOTE — Anesthesia Postprocedure Evaluation (Signed)
Anesthesia Post Note  Patient: Antonio Glenn  Procedure(s) Performed: CRANIOTOMY OF  LEFT PARIETAL TUMOR (Left Head) APPLICATION OF CRANIAL NAVIGATION (Left )     Patient location during evaluation: SICU Anesthesia Type: General Level of consciousness: sedated Pain management: pain level controlled Vital Signs Assessment: post-procedure vital signs reviewed and stable Respiratory status: patient remains intubated per anesthesia plan Cardiovascular status: stable Postop Assessment: no apparent nausea or vomiting Anesthetic complications: no   No complications documented.  Last Vitals:  Vitals:   02/19/20 0700 02/19/20 0743  BP: 118/66 (!) 170/86  Pulse: 76 81  Resp: 18 (!) 24  Temp:    SpO2: 95% 100%    Last Pain:  Vitals:   02/19/20 0400  TempSrc: Oral  PainSc:                  Catalina Gravel

## 2020-02-19 NOTE — Procedures (Signed)
Cortrak  Person Inserting Tube:  [name], RD Tube Type:  Cortrak - 43 inches Tube Location:  Left nare Initial Placement:  Stomach Secured by: Bridle Technique Used to Measure Tube Placement:  Documented cm marking at nare/ corner of mouth Cortrak Secured At:  70 cm   Cortrak Tube Team Note:  Consult received to place a Cortrak feeding tube.   No x-ray is required. RN may begin using tube.   If the tube becomes dislodged please keep the tube and contact the Cortrak team at [email or web address] ([email or web address]) for replacement.  If after hours and replacement cannot be delayed, place a NG tube and confirm placement with an abdominal x-ray.   [name] MS, RDN, LDN, CNSC Registered Dietitian III Clinical Nutrition RD Pager and On-Call Pager Number Located in [place]

## 2020-02-19 NOTE — Progress Notes (Signed)
   [facility]ology concerned about mediastinal mass on CXR    Recent Labs  Lab 02/15/20 1850 02/16/20 0507 02/16/20 0507 02/16/20 1643 02/17/20 0530 02/17/20 0530 02/17/20 1533 02/17/20 1533 02/17/20 1703 02/18/20 0520  NA  --  135  --   --  141  --  137  --  137 140  K  --  3.3*   < >  --  3.9   < > 3.5   < > 3.5 3.4*  CL  --  107  --   --  109  --   --   --   --  106  CO2  --  22  --   --  24  --   --   --   --  21*  GLUCOSE  --  151*  --   --  145*  --   --   --   --  132*  BUN  --  19  --   --  19  --   --   --   --  24*  CREATININE  --  0.54*  --   --  0.67  --   --   --   --  0.97  CALCIUM  --  6.9*  --   --  7.1*  --   --   --   --  8.0*  MG 2.3 2.0  --  2.3 2.1  --   --   --   --   --   PHOS 2.7 2.5  --  2.7 3.0  --   --   --   --   --    < > = values in this interval not displayed.     Estimated Creatinine Clearance: 122.3 mL/min (by C-G formula based on SCr of 0.97 mg/dL).   Plan  - CT chest with contrast    SIGNATURE    Dr. Brand Males, M.D., F.C.C.P,  Pulmonary and Critical Care Medicine Staff Physician, [place] Director - Interstitial Lung Disease  Program  Pulmonary [place] at [place], [place], [address]  Pager: [phone], If no answer  OR between  19:00-7:00h: page [phone] Telephone (clinical office): [phone] Telephone (research): [phone]  6:18 PM 02/19/2020

## 2020-02-19 NOTE — Progress Notes (Signed)
Neurology Progress Note   S:// Notes seen and examined Continues to be extremely encephalopathic Postop day 2 status post resection of the left parietal tumor continues to be encephalopathic and not extubated. No obvious clinical seizures noted  O:// Current vital signs: BP (!) 144/75   Pulse (!) 102   Temp 97.6 F (36.4 C) (Axillary)   Resp 17   Ht _0  (1.905 m)   Wt (!) 140.3 kg   SpO2 97%   BMI 38.66 kg/m  Vital signs in last 24 hours: Temp:  [97.6 F (36.4 C)-99.3 F (37.4 C)] 97.6 F (36.4 C) (12/03 0800) Pulse Rate:  [76-130] 102 (12/03 0900) Resp:  [14-24] 17 (12/03 0900) BP: (90-170)/(52-86) 144/75 (12/03 0900) SpO2:  [92 %-100 %] 97 % (12/03 0900) Arterial Line BP: (83-158)/(44-81) 108/61 (12/03 0700) FiO2 (%):  [40 %-100 %] 40 % (12/03 0755) Weight:  [140.3 kg] 140.3 kg (12/03 0400) General: Sedated on fentanyl, intubated HEENT: Left scalp incision warm dry intact, normocephalic CVS: Regular rhythm Respiratory: Intubated Abdomen: Obese, nondistended Neurological exam Patient is sedated on fentanyl, intubated He is laying in bed with one leg hanging from the right side of the bed He does not open eyes to voice but to minimal tactile stimulation, starts moving all 4 extremities His pupils are pinpoint bilaterally He has intact corneals bilaterally His breathing over the ventilator He does not follow any commands To noxious stimulation, symmetric localization with upper extremities bilaterally.  To noxious immolation lower extremities, symmetric withdrawal.   Medications  Current Facility-Administered Medications:  .  acetaminophen (TYLENOL) tablet 650 mg, 650 mg, Per Tube, Q4H PRN **OR** acetaminophen (TYLENOL) suppository 650 mg, 650 mg, Rectal, Q4H PRN, Agarwala, Ravi, MD .  chlorhexidine gluconate (MEDLINE KIT) (PERIDEX) 0.12 % solution 15 mL, 15 mL, Mouth Rinse, BID, Agarwala, Ravi, MD, 15 mL at 02/19/20 0851 .  Chlorhexidine Gluconate Cloth 2 %  PADS 6 each, 6 each, Topical, Daily, Kipp Brood, MD, 6 each at 02/18/20 2200 .  dexamethasone (DECADRON) injection 2 mg, 2 mg, Intravenous, Q6H, [name], MD .  dexmedetomidine (PRECEDEX) 400 MCG/100ML (4 mcg/mL) infusion, 0.4-2 mcg/kg/hr, Intravenous, Titrated, [name], MD, Last Rate: 74.2 mL/hr at 02/17/20 1311, 2 mcg/kg/hr at 02/17/20 1311 .  docusate (COLACE) 50 MG/5ML liquid 100 mg, 100 mg, Per Tube, BID, Agarwala, Ravi, MD, 100 mg at 02/19/20 0931 .  enalaprilat (VASOTEC) injection 1.25 mg, 1.25 mg, Intravenous, Q6H PRN, [name], MD .  enoxaparin (LOVENOX) injection 40 mg, 40 mg, Subcutaneous, Q24H, [name], MD, 40 mg at 02/19/20 0850 .  fentaNYL (SUBLIMAZE) bolus via infusion 50 mcg, 50 mcg, Intravenous, Q30 min PRN, Agarwala, Ravi, MD .  fentaNYL 2542mg in NS 2523m(1066mml) infusion-PREMIX, 0-400 mcg/hr, Intravenous, Continuous, Agarwala, Ravi, MD, Last Rate: 20 mL/hr at 02/19/20 0900, 200 mcg/hr at 02/19/20 0900 .  insulin aspart (novoLOG) injection 0-15 Units, 0-15 Units, Subcutaneous, Q4H, ThoVallarie MareD, 2 Units at 02/19/20 [id number] labetalol (NORMODYNE) injection 10 mg, 10 mg, Intravenous, Q10 min PRN, ThoVallarie MareD .  lacosamide (VIMPAT) 100 mg in sodium chloride 0.9 % 25 mL IVPB, 100 mg, Intravenous, Q12H, ThoVallarie MareD, Stopped at 02/18/20 2339 .  MEDLINE mouth rinse, 15 mL, Mouth Rinse, 10 times per day, Agarwala, RavEinar GradD, 15 mL at 02/19/20 0932 .  midazolam (VERSED) 50 mg/50 mL (1 mg/mL) premix infusion, 0.5-10 mg/hr, Intravenous, Continuous, Agarwala, Ravi, MD, Last Rate: 7 mL/hr at 02/19/20 0900,  7 mg/hr at 02/19/20 0900 .  midazolam (VERSED) bolus via infusion 2 mg, 2 mg, Intravenous, Q30 min PRN, Agarwala, Ravi, MD .  nicardipine (CARDENE) 46m in 0.86% saline 2032mIV infusion (0.1 mg/ml), 3-15 mg/hr, Intravenous, Continuous, ThVallarie MareMD .  ondansetron (ZNorth[facility] Endoscopy Centertablet 4 mg, 4 mg, Per Tube, Q4H  PRN **OR** ondansetron (ZOFRAN) injection 4 mg, 4 mg, Intravenous, Q4H PRN, Agarwala, Ravi, MD .  pantoprazole sodium (PROTONIX) 40 mg/20 mL oral suspension 40 mg, 40 mg, Per Tube, Daily, Agarwala, Ravi, MD, 40 mg at 02/19/20 0931 .  PHENobarbital (LUMINAL) tablet 64.8 mg, 64.8 mg, Oral, BID, Agarwala, Ravi, MD, 64.8 mg at 02/19/20 0932 .  polyethylene glycol (MIRALAX / GLYCOLAX) packet 17 g, 17 g, Per Tube, Daily PRN, Agarwala, Ravi, MD .  promethazine (PHENERGAN) tablet 12.5-25 mg, 12.5-25 mg, Per Tube, Q4H PRN, Agarwala, Ravi, MD .  propofol (DIPRIVAN) 1000 MG/100ML infusion, 5-80 mcg/kg/min, Intravenous, Titrated, AgKipp BroodMD, Stopped at 02/18/20 1504 .  QUEtiapine (SEROQUEL) tablet 100 mg, 100 mg, Per Tube, BID, Agarwala, Ravi, MD, 100 mg at 02/19/20 0931 .  sodium phosphate (FLEET) 7-19 GM/118ML enema 1 enema, 1 enema, Rectal, Once PRN, ThVallarie MareMD Labs CBC    Component Value Date/Time   WBC 10.1 02/19/2020 0450   RBC 4.38 02/19/2020 0450   HGB 13.6 02/19/2020 0450   HCT 40.1 02/19/2020 0450   PLT 209 02/19/2020 0450   MCV 91.6 02/19/2020 0450   MCH 31.1 02/19/2020 0450   MCHC 33.9 02/19/2020 0450   RDW 13.9 02/19/2020 0450   LYMPHSABS 0.5 (L) 02/19/2020 0450   MONOABS 0.8 02/19/2020 0450   EOSABS 0.0 02/19/2020 0450   BASOSABS 0.0 02/19/2020 0450    CMP     Component Value Date/Time   NA 140 02/18/2020 0520   K 3.4 (L) 02/18/2020 0520   CL 106 02/18/2020 0520   CO2 21 (L) 02/18/2020 0520   GLUCOSE 132 (H) 02/18/2020 0520   BUN 24 (H) 02/18/2020 0520   CREATININE 0.97 02/18/2020 0520   CALCIUM 8.0 (L) 02/18/2020 0520   GFRNONAA >60 02/18/2020 0520     Imaging I have reviewed images in epic and the results pertinent to this consultation are: CT head with and without contrast 2020-02-12-left parietal mass with no change from prior.  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  MRI 2020-02-18 with expected postoperative changes post gross total  resection of the enhancing component of the left parietotemporal mass.  Minimal enhancement at the resection cavity margins is probably postoperative but small volume residual tumor is difficult to exclude due to the irregular appearance of the preoperative mass. T2 hyperintensity along the left hippocampus and posterior medial left thalamus may be related to recent seizure activity, postsurgical or rapid progression of the infiltrating tumor.  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 Repeat MRI yesterday showed the expected postoperative changes but in addition also showed some T2 hyperintensity along the left hippocampus, posterior medial left thalamus which might reflect seizure edema. He was seen by Dr. YaHortense Ramalnitially in consult and started on Vimpat 100 twice daily. His exam remains extremely encephalopathic with not being on to follow any commands. To spot EEGs have only shown evidence of epileptogenicity from left frontotemporal as well as left hemispheric dysfunction without any seizures Due to  ongoing encephalopathy, I will hook him up to EEG long-term monitoring at least for 24 hours  Impression: Left parietotemporal mass Persistent encephalopathy Evaluate for seizures  Recommendations: LTM EEG Continue Vimpat for now Maintain seizure precautions If the EEG shows more irritability or evidence of seizures, will increase Vimpat and add a second antiepileptic. When okay with neurosurgery, can consider tapering steroids as they might be causing some of the agitation/altered mental status.  -- Amie Portland, MD [facility] Pager: [phone] If 7pm to 7am, please call on call as listed on AMION.  CRITICAL CARE ATTESTATION Performed by: Amie Portland,  MD Total critical care time: 31 minutes Critical care time was exclusive of separately billable procedures and treating other patients and/or supervising APPs/Residents/Students Critical care was necessary to treat or prevent imminent or life-threatening deterioration due to persistent encephalopathy, brain mass, evaluation of seizures This patient is critically ill and at significant risk for neurological worsening and/or death and care requires constant monitoring. Critical care was time spent personally by me on the following activities: development of treatment plan with patient and/or surrogate as well as nursing, discussions with consultants, evaluation of patient's response to treatment, examination of patient, obtaining history from patient or surrogate, ordering and performing treatments and interventions, ordering and review of laboratory studies, ordering and review of radiographic studies, pulse oximetry, re-evaluation of patient's condition, participation in multidisciplinary rounds and medical decision making of high complexity in the care of this patient.

## 2020-02-19 NOTE — Progress Notes (Signed)
Nutrition Follow-up  DOCUMENTATION CODES:   Obesity unspecified  INTERVENTION:   Initiate tube feeds via Cortrak: - Osmolite 1.5 @ 55 ml/hr (1320 ml/day) - ProSource TF 90 ml TID  Tube feeding regimen provides 2220 kcal, 148 grams of protein, and 1003 ml of H2O.    NUTRITION DIAGNOSIS:   Inadequate oral intake related to lethargy/confusion as evidenced by NPO status.  Ongoing.   GOAL:   Patient will meet greater than or equal to 90% of their needs  Met with TF.   MONITOR:   Diet advancement, Labs, Weight trends, TF tolerance, I & O's  REASON FOR ASSESSMENT:   Consult Enteral/tube feeding initiation and management  ASSESSMENT:   59 year old male who presented on 11/25 with seizure-like activity. Pt was recently found to have brain mass concerning for neoplasm with plan for left craniotomy on 12/01.  Pt discussed during ICU rounds and with RN.  Enema ordered due no bm  11/29 cortrak placed; later pt pulled tube out 12/1 s/p crani of L parietal tumor  12/3 cortrak placed; tip gastric  Patient is currently intubated on ventilator support MV: 12.1 L/min Temp (24hrs), Avg:98.2 F (36.8 C), Min:97.6 F (36.4 C), Max:98.6 F (37 C)  Medications reviewed and include: decadron, colace, SSI Fentanyl Versed  Labs reviewed: K+ 3.4     Diet Order:   Diet Order    None      EDUCATION NEEDS:   No education needs have been identified at this time  Skin:  Skin Assessment: Reviewed RN Assessment  Last BM:  11/27  Height:   Ht Readings from Last 1 Encounters:  02/17/20 6' 3" (1.905 m)    Weight:   Wt Readings from Last 1 Encounters:  02/19/20 (!) 140.3 kg    Ideal Body Weight:     BMI:  Body mass index is 38.66 kg/m.  Estimated Nutritional Needs:   Kcal:  2250-2450  Protein:  120-145 grams  Fluid:  >/= 2.0 L  Colan Laymon P., RD, LDN, CNSC See AMiON for contact information

## 2020-02-19 NOTE — Progress Notes (Signed)
NAME:  Antonio Glenn, MRN:  [id number], DOB:  1960-10-14, LOS: 8 ADMISSION DATE:  02/11/2020, CONSULTATION DATE:  02/11/20 REFERRING MD:  Viona Gilmore  CHIEF COMPLAINT:  AMS / Seizures   Brief History   Antonio Glenn is a 59 y.o. male with hx of recently diagnosed brain tumor who was transferred from [facility] ED to [facility] 11/25 with AMS and seizures.  He is scheduled for left craniotomy on Dec 1 with Dr. Marcello Moores.  Past Medical History  Brain Mass.  Significant Hospital Events   11/25 > admit.  Consults:  Neurosurgery  Procedures:  None.  Significant Diagnostic Tests:  CT head 11/25 > 6 - 7cm mass in left parietal lobe with areas of internal cystic necrosis.  Mild regional mass effect with no MLS.   EEG 11/25 >   Micro Data:  Flu 11/25 >  COVID 11/25 >   Antimicrobials:  None.   Interim history/subjective:    02/19/2020 - NSGY recommending c-EEG givne "subtle left parahippocampal/hippocampal T2 hyperintensity suggestive of recent seizures (this signal was not apparent 3 weeks ago, so new tumor infiltration in this area would be less likely)"> they are reducing steroids  On fent gtt/versed gtt -> and still restless moving all 4s. Opens eyes to voice but does not follow commands. RN asking about starting TF and removing foley. Some days ago had hematuria after self dc of foley. RN planning for enema. RD here for cortrak  Objective:  Blood pressure (!) 144/75, pulse (!) 102, temperature 97.6 F (36.4 C), temperature source Axillary, resp. rate 17, height 6\' 3"  (1.905 m), weight (!) 140.3 kg, SpO2 97 %.    Vent Mode: PRVC FiO2 (%):  [40 %-100 %] 40 % Set Rate:  [18 bmp] 18 bmp Vt Set:  [670 mL] 670 mL PEEP:  [5 cmH20] 5 cmH20 Pressure Support:  [12 cmH20] 12 cmH20 Plateau Pressure:  [18 cmH20-20 cmH20] 19 cmH20   Intake/Output Summary (Last 24 hours) at 02/19/2020 1004 Last data filed at 02/19/2020 0900 Gross per 24 hour  Intake 1945.08 ml  Output 975 ml  Net 970.08 ml    Filed Weights   02/17/20 1152 02/18/20 0500 02/19/20 0400  Weight: (!) 140 kg (!) 138.3 kg (!) 140.3 kg    Examination: General Appearance:  Looks criticall ill OBESE - + Head:  Normocephalic, without obvious abnormality, atraumatic Eyes:  PERRL - yes, conjunctiva/corneas - muddy     Ears:  Normal external ear canals, both ears Nose:  G tube - no Throat:  ETT TUBE - yes , OG tube - yes Neck:  Supple,  No enlargement/tenderness/nodules Lungs: Clear to auscultation bilaterally, Ventilator   Synchrony - yes Heart:  S1 and S2 normal, no murmur, CVP - no.  Pressors - no Abdomen:  Soft, no masses, no organomegaly Genitalia / Rectal:  Not done Extremities:  Extremities- intact Skin:  ntact in exposed areas . Sacral area - not examined Neurologic:  Sedation - fent gtt, versed gtt -> RASS - -3 . Moves all 4s - yes. CAM-ICU - not able to assess . Orientation - not      Assessment & Plan:  Acute Resp failure   02/19/2020 - > does not meet criteria for SBT/Extubation in setting of Acute Respiratory Failure due to agitation  Plan  PRVC VAP bundle    Acute encephalopathy/hyperactive delirium due to left parietal tumor, requiring titration of IV sedative infusionstumor resection/craniotomy for a scheduled on 12/1 by Dr. Marcello Moores  Seizure  episode 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   02/19/2020 - biopsy report pending. STill agitated. NSGY concerned for seizures  Plan - fent gtt  - versed gtt  - add diprivan gtt  - c-EEG per NSGY And neuro sconsult - continue   -Scheduled phenobarbital. -Continue dexamethasone 10 mg every 6 hours  -Continue Keppra 500 mg twice daily - contnue seroquel -Continue lorazepam as needed for seizure activity  PAF - A Fib hx  02/19/2020 - HR 118 and in sinus  plan continue Cardizem Holding anticoagulation for stroke prophylaxis due to scheduled brain surgery in few days Check QTC      Daily Goals Checklist   Pain/Anxiety/Delirium protocol (if indicated):  VAP protocol (if indicated): yes Blood pressure target: Keep systolic blood pressure less than 170 DVT prophylaxis: Mechanical prophylaxis Nutrition Status: cortrak and start TF 02/19/2020  GI prophylaxis: Pantoprazole 40 mg daily Fluid status goals: Allow autoregulation Urinary catheter: Foley dC and monitor -> urology consult if problems (per daughter no known prior urologica problems) Central lines: None Glucose control: Phase 1 glycemic control Mobility/therapy needs: Bedrest, continues to require restraints for patient safety Antibiotic de-escalation: On no antibiotic Home medication reconciliation: On hold Daily labs: CBC, BMP Code Status: Full Family Communication: Daughter Fields Oros -> [phone] - Updated. Says RN updated earlier. Disposition: ICU    MDT Goals of Care Discussion - DOA 02/11/2020  LOS 8   Date of Discussion   Primary service for patient PCCM  Location of discussion 4N  Family and Staff present   Summary of discussion Only daily updated 02/19/2020   Followup goals of care due by 02/18/20  Misc comments if any             ATTESTATION & SIGNATURE   The patient Antonio Glenn is critically ill with multiple organ systems failure and requires high complexity decision making for assessment and support, frequent evaluation and titration of therapies, application of advanced monitoring technologies and extensive interpretation of multiple databases.   Critical Care Time devoted to patient care services described in this note is  45  Minutes. This time reflects time of care of this signee Dr Brand Males. This critical care time does not reflect procedure time, or teaching time or supervisory time of PA/NP/Med student/Med Resident etc but could involve care discussion time     Dr. Brand Males, M.D., [facility].C.P Pulmonary and Critical Care Medicine Staff Physician [place]  Pulmonary and Critical Care Pager: [phone], If no answer or between  15:00h - 7:00h: call [phone]  02/19/2020 10:04 AM    LABS    PULMONARY Recent Labs  Lab 02/14/20 1627 02/17/20 1533 02/17/20 1703  PHART  --  7.344* 7.371  PCO2ART  --  45.9 43.6  PO2ART  --  72* 275*  HCO3 24.1 25.0 25.3  TCO2  --  26 27  O2SAT 80.4 93.0 100.0    CBC Recent Labs  Lab 02/17/20 0530 02/17/20 1533 02/17/20 1703 02/18/20 0520 02/19/20 0450  HGB 14.7   < > 16.0 15.3 13.6  HCT 45.9   < > 47.0 46.9 40.1  WBC 10.3  --   --  14.5* 10.1  PLT 232  --   --  278 209   < > = values in this interval not displayed.    COAGULATION Recent Labs  Lab 02/17/20 0121  INR 1.2    CARDIAC  No results for input(s):  TROPONINI in the last 168 hours. No results for input(s): PROBNP in the last 168 hours.   CHEMISTRY Recent Labs  Lab 02/15/20 1850 02/16/20 0507 02/16/20 0507 02/16/20 1643 02/17/20 0530 02/17/20 0530 02/17/20 1533 02/17/20 1533 02/17/20 1703 02/18/20 0520  NA  --  135  --   --  141  --  137  --  137 140  K  --  3.3*   < >  --  3.9   < > 3.5   < > 3.5 3.4*  CL  --  107  --   --  109  --   --   --   --  106  CO2  --  22  --   --  24  --   --   --   --  21*  GLUCOSE  --  151*  --   --  145*  --   --   --   --  132*  BUN  --  19  --   --  19  --   --   --   --  24*  CREATININE  --  0.54*  --   --  0.67  --   --   --   --  0.97  CALCIUM  --  6.9*  --   --  7.1*  --   --   --   --  8.0*  MG 2.3 2.0  --  2.3 2.1  --   --   --   --   --   PHOS 2.7 2.5  --  2.7 3.0  --   --   --   --   --    < > = values in this interval not displayed.   Estimated Creatinine Clearance: 122.3 mL/min (by C-G formula based on SCr of 0.97 mg/dL).   LIVER Recent Labs  Lab 02/17/20 0121  INR 1.2     INFECTIOUS No results for input(s): LATICACIDVEN, PROCALCITON in the last 168 hours.   ENDOCRINE CBG (last 3)  Recent Labs    02/18/20 2317 02/19/20 0329 02/19/20 0819   GLUCAP 138* 141* 148*         IMAGING x48h  - image(s) personally visualized  -   highlighted in bold MR BRAIN W WO CONTRAST  Result Date: 02/18/2020 CLINICAL DATA:  Brain mass post resection EXAM: MRI HEAD WITHOUT AND WITH CONTRAST TECHNIQUE: Multiplanar, multiecho pulse sequences of the brain and surrounding structures were obtained without and with intravenous contrast. CONTRAST:  37mL GADAVIST GADOBUTROL 1 MMOL/ML IV SOLN COMPARISON:  01/30/2020 FINDINGS: Brain: There are new postoperative changes of left parietotemporal mass resection with a resection cavity containing fluid, blood products, and minimal air. There is mild surrounding diffusion restriction likely reflecting postoperative contusion. Additional pneumocephalus is present primarily along the left frontal convexity. Contrast enhanced imaging is degraded by motion artifact. There is minimal enhancement at the resection cavity margins. Extent of T2 FLAIR hyperintensity remains unchanged. There is persistent partial effacement of the posterior left lateral ventricle. There is additional mild diffusion and T2 hyperintensity without ADC hypointensity along the left hippocampus and posteromedial left thalamus. Vascular: Major vessel flow voids at the skull base are preserved. Skull and upper cervical spine: Left parietal craniotomy. Normal marrow signal is preserved. Sinuses/Orbits: Paranasal sinuses are aerated. Orbits are unremarkable. Other: Sella is unremarkable.  Mastoid air cells are clear. IMPRESSION: Expected postoperative changes post gross total resection of enhancing component of left  parietotemporal mass. Minimal enhancement at the resection cavity margins is probably postoperative but small volume residual tumor is difficult to exclude due to irregular appearance of the preoperative mass. T2 hyperintensity along the left hippocampus and posteromedial left thalamus. May be related to recent seizure activity, postsurgical, or rapid  progression of infiltrating tumor. Electronically Signed   By: Macy Mis M.D.   On: 02/18/2020 11:56   DG CHEST PORT 1 VIEW  Result Date: 02/18/2020 CLINICAL DATA:  Endotracheal tube. EXAM: PORTABLE CHEST 1 VIEW COMPARISON:  February 12, 2020. FINDINGS: Endotracheal and nasogastric tubes are in good position. Left-sided PICC line is noted with tip in expected position of the SVC. No pneumothorax or pleural effusion is noted. Minimal bibasilar subsegmental atelectasis is noted. There is increased prominence involving the right paramediastinal and perihilar regions since prior exam. Bony thorax is unremarkable. IMPRESSION: Endotracheal and nasogastric tubes in good position. Increased prominence involving right paramediastinal and perihilar regions since prior exam. CT scan of the chest with Intravenous contrast is recommended for further evaluation. These results will be called to the ordering clinician or representative by the Radiologist Assistant, and communication documented in the PACS or zVision Dashboard. Electronically Signed   By: Marijo Conception M.D.   On: 02/18/2020 09:48   DG Abd Portable 1V  Result Date: 02/17/2020 CLINICAL DATA:  Check gastric catheter placement EXAM: PORTABLE ABDOMEN - 1 VIEW COMPARISON:  None. FINDINGS: Gastric catheter is noted within the distal stomach. Scattered large and small bowel gas is noted. No free air is seen. IMPRESSION: Gastric catheter within the stomach. Electronically Signed   By: Inez Catalina M.D.   On: 02/17/2020 22:27

## 2020-02-19 NOTE — Progress Notes (Signed)
D.w Dr Marcello Moores - NSGY primary/.  CCM close consult. Updated attending of record    SIGNATURE    Dr. Brand Males, M.D., F.C.C.P,  Pulmonary and Critical Care Medicine Staff Physician, [place] Director - Interstitial Lung Disease  Program  Pulmonary [place] at [place], [place], [address]  Pager: [phone], If no answer  OR between  19:00-7:00h: page [phone] Telephone (clinical office): [phone] Telephone (research): [phone]  5:51 PM 02/19/2020

## 2020-02-19 NOTE — Progress Notes (Signed)
LTM EEG hooked up and running - no initial skin breakdown - push button tested - neuro notified.  

## 2020-02-20 ENCOUNTER — Inpatient Hospital Stay (HOSPITAL_COMMUNITY): Payer: BC Managed Care – PPO

## 2020-02-20 DIAGNOSIS — J9600 Acute respiratory failure, unspecified whether with hypoxia or hypercapnia: Secondary | ICD-10-CM

## 2020-02-20 DIAGNOSIS — G9389 Other specified disorders of brain: Secondary | ICD-10-CM | POA: Diagnosis not present

## 2020-02-20 DIAGNOSIS — G9340 Encephalopathy, unspecified: Secondary | ICD-10-CM | POA: Diagnosis not present

## 2020-02-20 DIAGNOSIS — R4182 Altered mental status, unspecified: Secondary | ICD-10-CM | POA: Diagnosis not present

## 2020-02-20 LAB — CBC WITH DIFFERENTIAL/PLATELET
Abs Immature Granulocytes: 0.25 10*3/uL — ABNORMAL HIGH (ref 0.00–0.07)
Basophils Absolute: 0 10*3/uL (ref 0.0–0.1)
Basophils Relative: 0 %
Eosinophils Absolute: 0 10*3/uL (ref 0.0–0.5)
Eosinophils Relative: 0 %
HCT: 40.1 % (ref 39.0–52.0)
Hemoglobin: 13.3 g/dL (ref 13.0–17.0)
Immature Granulocytes: 3 %
Lymphocytes Relative: 7 %
Lymphs Abs: 0.6 10*3/uL — ABNORMAL LOW (ref 0.7–4.0)
MCH: 30.8 pg (ref 26.0–34.0)
MCHC: 33.2 g/dL (ref 30.0–36.0)
MCV: 92.8 fL (ref 80.0–100.0)
Monocytes Absolute: 1 10*3/uL (ref 0.1–1.0)
Monocytes Relative: 11 %
Neutro Abs: 7.5 10*3/uL (ref 1.7–7.7)
Neutrophils Relative %: 79 %
Platelets: 242 10*3/uL (ref 150–400)
RBC: 4.32 MIL/uL (ref 4.22–5.81)
RDW: 14 % (ref 11.5–15.5)
WBC: 9.5 10*3/uL (ref 4.0–10.5)
nRBC: 0 % (ref 0.0–0.2)

## 2020-02-20 LAB — COMPREHENSIVE METABOLIC PANEL
ALT: 66 U/L — ABNORMAL HIGH (ref 0–44)
AST: 25 U/L (ref 15–41)
Albumin: 2.6 g/dL — ABNORMAL LOW (ref 3.5–5.0)
Alkaline Phosphatase: 54 U/L (ref 38–126)
Anion gap: 8 (ref 5–15)
BUN: 25 mg/dL — ABNORMAL HIGH (ref 6–20)
CO2: 25 mmol/L (ref 22–32)
Calcium: 8.1 mg/dL — ABNORMAL LOW (ref 8.9–10.3)
Chloride: 107 mmol/L (ref 98–111)
Creatinine, Ser: 0.6 mg/dL — ABNORMAL LOW (ref 0.61–1.24)
GFR, Estimated: >60 mL/min (ref >60)
Glucose, Bld: 120 mg/dL — ABNORMAL HIGH (ref 70–99)
Potassium: 4.1 mmol/L (ref 3.5–5.1)
Sodium: 140 mmol/L (ref 135–145)
Total Bilirubin: 0.8 mg/dL (ref 0.3–1.2)
Total Protein: 5.3 g/dL — ABNORMAL LOW (ref 6.5–8.1)

## 2020-02-20 LAB — GLUCOSE, CAPILLARY
Glucose-Capillary: 109 mg/dL — ABNORMAL HIGH (ref 70–99)
Glucose-Capillary: 125 mg/dL — ABNORMAL HIGH (ref 70–99)
Glucose-Capillary: 150 mg/dL — ABNORMAL HIGH (ref 70–99)
Glucose-Capillary: 95 mg/dL (ref 70–99)
Glucose-Capillary: 95 mg/dL (ref 70–99)

## 2020-02-20 LAB — PHOSPHORUS: Phosphorus: 2.9 mg/dL (ref 2.5–4.6)

## 2020-02-20 LAB — MAGNESIUM: Magnesium: 2.2 mg/dL (ref 1.7–2.4)

## 2020-02-20 LAB — PHENOBARBITAL LEVEL: Phenobarbital: 8.5 ug/mL — ABNORMAL LOW (ref 15.0–30.0)

## 2020-02-20 MED ORDER — IOHEXOL 300 MG/ML  SOLN
75.0000 mL | Freq: Once | INTRAMUSCULAR | Status: AC | PRN
  Administered 2020-02-20: 75 mL via INTRAVENOUS

## 2020-02-20 MED ORDER — LACTATED RINGERS IV BOLUS
500.0000 mL | Freq: Once | INTRAVENOUS | Status: AC
  Administered 2020-02-20: 500 mL via INTRAVENOUS

## 2020-02-20 MED ORDER — POTASSIUM CHLORIDE 20 MEQ PO PACK
40.0000 meq | PACK | Freq: Once | ORAL | Status: DC
  Filled 2020-02-20: qty 2

## 2020-02-20 MED ORDER — POTASSIUM CHLORIDE 20 MEQ PO PACK
40.0000 meq | PACK | Freq: Two times a day (BID) | ORAL | Status: DC
  Administered 2020-02-20: 40 meq

## 2020-02-20 MED ORDER — PHENOBARBITAL 32.4 MG PO TABS
97.2000 mg | ORAL_TABLET | Freq: Every day | ORAL | Status: DC
  Administered 2020-02-21: 97.2 mg via ORAL
  Filled 2020-02-20: qty 3

## 2020-02-20 MED ORDER — PHENOBARBITAL 32.4 MG PO TABS
64.8000 mg | ORAL_TABLET | Freq: Every day | ORAL | Status: DC
  Administered 2020-02-21 – 2020-02-22 (×2): 64.8 mg via ORAL
  Filled 2020-02-20 (×2): qty 2

## 2020-02-20 NOTE — Progress Notes (Addendum)
NAME:  Antonio Glenn, MRN:  [id number], DOB:  09/29/1960, LOS: 9 ADMISSION DATE:  02/11/2020, CONSULTATION DATE:  02/11/20 REFERRING MD:  Antonio Glenn  CHIEF COMPLAINT:  AMS / Seizures   Brief History   Antonio Glenn is a 59 y.o. male with hx of recently diagnosed brain tumor who was transferred from [place] ED to [facility] 11/25 with AMS and seizures.  He is scheduled for left craniotomy on Dec 1 with Dr. Marcello Glenn.  Past Medical History  Brain Mass.  Significant Hospital Events   11/25 > admit.  12/1 -  CRANIOTOMY OF  LEFT PARIETAL TUMOR   12./3 - NSGY recommending c-EEG givne "subtle left parahippocampal/hippocampal T2 hyperintensity suggestive of recent seizures (this signal was not apparent 3 weeks ago, so new tumor infiltration in this area would be less likely)"> they are reducing steroids. On fent gtt/versed gtt -> and still restless moving all 4s. Opens eyes to voice but does not follow commands. RN asking about starting TF and removing foley. Some days ago had hematuria after self dc of foley. RN planning for enema. RD here for cortrak  Consults:  Neurosurgery  Procedures:  None.  Significant Diagnostic Tests:  CT head 11/25 > 6 - 7cm mass in left parietal lobe with areas of internal cystic necrosis.  Mild regional mass effect with no MLS.   EEG 11/25 >   Micro Data:  Flu 11/25 >  COVID 11/25 >   Antimicrobials:  None.   Interim history/subjective:    02/20/2020 - ongoing eeg. On vent. On fent gtt, versed gtt and diprivan gtt. Restless when diprivan lowered to < 20 and hypotensive when diprivan increased to > 2mcg/min though sedated. CT chest pending. NO issuss after foley removal  Objective:  Blood pressure 125/83, pulse 64, temperature 97.8 F (36.6 C), temperature source Axillary, resp. rate 18, height 6\' 3"  (1.905 m), weight (!) 140.3 kg, SpO2 96 %.    Vent Mode: PRVC FiO2 (%):  [40 %] 40 % Set Rate:  [18 bmp] 18 bmp Vt Set:  [670 mL] 670 mL PEEP:  [5 cmH20] 5  cmH20 Plateau Pressure:  [18 cmH20-23 cmH20] 22 cmH20   Intake/Output Summary (Last 24 hours) at 02/20/2020 9509 Last data filed at 02/20/2020 0700 Gross per 24 hour  Intake 1785.6 ml  Output 1305 ml  Net 480.6 ml   Filed Weights   02/17/20 1152 02/18/20 0500 02/19/20 0400  Weight: (!) 140 kg (!) 138.3 kg (!) 140.3 kg    General Appearance:  Looks criticall ill OBESE - yes Head:  Normocephalic, without obvious abnormality, atraumatic . c-EEG + Eyes:  PERRL - yes, conjunctiva/corneas - muddy.      Ears:  Normal external ear canals, both ears Nose:  G tube - no Throat:  ETT TUBE - yes , OG tube - yes Neck:  Supple,  No enlargement/tenderness/nodules Lungs: Clear to auscultation bilaterally, Ventilator   Synchrony - intermitent. Yes when not agitated Heart:  S1 and S2 normal, no murmur, CVP - no.  Pressors - no Abdomen:  Soft, no masses, no organomegaly Genitalia / Rectal:  Not done Extremities:  Extremities- intact Skin:  ntact in exposed areas . Sacral area - not examin Neurologic:  Sedation - fent gtt, versed gtt, diprivan gtt -> RASS - +2 to -2 without eyes open . Moves all 4s - yes. CAM-ICU - neg . Orientation - not oriented         Assessment & Plan:  Acute Resp  failure due to brain mass and acute encephalopathy   02/20/2020 - > does bit meet criteria for SBT/Extubation in setting of Acute Respiratory Failure due to agiutated encephalopathy  Plan PRVC VAP bundele Get CT chest rule out chest mass    Acute encephalopathy/hyperactive delirium due to left parietal tumor, requiring titration of IV sedative infusionstumor resection/craniotomy for a scheduled on 12/1 by Dr. 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 STill agitated. NSGY concerned for seizures. On triple sedation gtt  Plan - fent gtt  - versed gtt  - add diprivan gtt  - c-EEG per NSGY And neuro sconsult - continue    -Scheduled phenobarbital. -Continue dexamethasone 10 mg every 6 hours  -Continue Keppra 500 mg twice daily - contnue seroquel -Continue lorazepam as needed for seizure activity - RASS goal 0  PAF - A Fib hx  02/20/2020 - HR  86  in sinus. QTC 452 msec on 02/19/20  plan continue Cardizem Holding full dose anticoagulation for stroke prophylaxis due to scheduled brain surgery in few days   Intermittent hypotension with diprivan  Plan  - fluid bolus and reassess   Electrolyte imbalance  02/20/2020 - low K  Plan  - replete   Daily Goals Checklist  Pain/Anxiety/Delirium protocol (if indicated):  VAP protocol (if indicated): yes Blood pressure target: Keep systolic blood pressure less than 170 DVT prophylaxis: low dose lovenox 02/19/20 Nutrition Status: cortrak start TF  GI prophylaxis: Pantoprazole 40 mg daily Fluid status goals: Allow autoregulation Urinary catheter: Foley dC 02/19/20 and monitor -> urology consult if problems (per daughter no known prior urologica problems) Central lines: None Glucose control: Phase 1 glycemic control Mobility/therapy needs: Bedrest, continues to require restraints for patient safety Antibiotic de-escalation: On no antibiotic Home medication reconciliation: On hold Code Status: Full Family Communication: Daughter Antonio Glenn -> [phone] - Updated 02/19/20 and 02/20/20  Disposition: ICU    MDT Goals of Care Discussion -   Date of Discussion   Primary service for patient PCCM  Location of discussion 4N  Family and Staff present   Summary of discussion MD only update on 02/19/2020 and 02/20/20   Followup goals of care due by 02/18/20  Misc comments if any            ATTESTATION & SIGNATURE   The patient Antonio Glenn is critically ill with multiple organ systems failure and requires high complexity decision making for assessment and support, frequent evaluation and titration of therapies, application of advanced  monitoring technologies and extensive interpretation of multiple databases.   Critical Care Time devoted to patient care services described in this note is  45  Minutes. This time reflects time of care of this signee Dr Brand Males. This critical care time does not reflect procedure time, or teaching time or supervisory time of PA/NP/Med student/Med Resident etc but could involve care discussion time     Dr. Brand Males, M.D., [facility].C.P Pulmonary and Critical Care Medicine Staff Physician [facility] Pager: [phone], If no answer or between  15:00h - 7:00h: call [phone]  02/20/2020 8:52 AM    LABS    PULMONARY Recent Labs  Lab 02/14/20 1627 02/17/20 1533 02/17/20 1703  PHART  --  7.344* 7.371  PCO2ART  --  45.9 43.6  PO2ART  --  72* 275*  HCO3 24.1 25.0 25.3  TCO2  --  26 27  O2SAT 80.4 93.0 100.0    CBC Recent Labs  Lab 02/17/20 0530 02/17/20 1533 02/17/20 1703 02/18/20 0520 02/19/20 0450  HGB 14.7   < > 16.0 15.3 13.6  HCT 45.9   < > 47.0 46.9 40.1  WBC 10.3  --   --  14.5* 10.1  PLT 232  --   --  278 209   < > = values in this interval not displayed.    COAGULATION Recent Labs  Lab 02/17/20 0121  INR 1.2    CARDIAC  No results for input(s): TROPONINI in the last 168 hours. No results for input(s): PROBNP in the last 168 hours.   CHEMISTRY Recent Labs  Lab 02/15/20 1850 02/16/20 0507 02/16/20 0507 02/16/20 1643 02/17/20 0530 02/17/20 0530 02/17/20 1533 02/17/20 1533 02/17/20 1703 02/18/20 0520  NA  --  135  --   --  141  --  137  --  137 140  K  --  3.3*   < >  --  3.9   < > 3.5   < > 3.5 3.4*  CL  --  107  --   --  109  --   --   --   --  106  CO2  --  22  --   --  24  --   --   --   --  21*  GLUCOSE  --  151*  --   --  145*  --   --   --   --  132*  BUN  --  19  --   --  19  --   --   --   --  24*  CREATININE  --  0.54*  --   --  0.67  --   --   --   --  0.97  CALCIUM  --   6.9*  --   --  7.1*  --   --   --   --  8.0*  MG 2.3 2.0  --  2.3 2.1  --   --   --   --   --   PHOS 2.7 2.5  --  2.7 3.0  --   --   --   --   --    < > = values in this interval not displayed.   Estimated Creatinine Clearance: 122.3 mL/min (by C-G formula based on SCr of 0.97 mg/dL).   LIVER Recent Labs  Lab 02/17/20 0121  INR 1.2     INFECTIOUS No results for input(s): LATICACIDVEN, PROCALCITON in the last 168 hours.   ENDOCRINE CBG (last 3)  Recent Labs    02/19/20 1918 02/19/20 2313 02/20/20 0352  GLUCAP 139* 130* 150*         IMAGING x48h  - image(s) personally visualized  -   highlighted in bold MR BRAIN W WO CONTRAST  Result Date: 02/18/2020 CLINICAL DATA:  Brain mass post resection EXAM: MRI HEAD WITHOUT AND WITH CONTRAST TECHNIQUE: Multiplanar, multiecho pulse sequences of the brain and surrounding structures were obtained without and with intravenous contrast. CONTRAST:  71mL GADAVIST GADOBUTROL 1 MMOL/ML IV SOLN COMPARISON:  01/30/2020 FINDINGS: Brain: There are new postoperative changes of left parietotemporal mass resection with a resection cavity containing fluid, blood products, and minimal air. There is mild surrounding diffusion restriction likely reflecting postoperative contusion. Additional pneumocephalus is present primarily along the left frontal convexity. Contrast enhanced imaging is degraded by motion artifact. There  is minimal enhancement at the resection cavity margins. Extent of T2 FLAIR hyperintensity remains unchanged. There is persistent partial effacement of the posterior left lateral ventricle. There is additional mild diffusion and T2 hyperintensity without ADC hypointensity along the left hippocampus and posteromedial left thalamus. Vascular: Major vessel flow voids at the skull base are preserved. Skull and upper cervical spine: Left parietal craniotomy. Normal marrow signal is preserved. Sinuses/Orbits: Paranasal sinuses are aerated. Orbits  are unremarkable. Other: Sella is unremarkable.  Mastoid air cells are clear. IMPRESSION: Expected postoperative changes post gross total resection of enhancing component of left parietotemporal mass. Minimal enhancement at the resection cavity margins is probably postoperative but small volume residual tumor is difficult to exclude due to irregular appearance of the preoperative mass. T2 hyperintensity along the left hippocampus and posteromedial left thalamus. May be related to recent seizure activity, postsurgical, or rapid progression of infiltrating tumor. Electronically Signed   By: Macy Mis M.D.   On: 02/18/2020 11:56   DG CHEST PORT 1 VIEW  Result Date: 02/18/2020 CLINICAL DATA:  Endotracheal tube. EXAM: PORTABLE CHEST 1 VIEW COMPARISON:  February 12, 2020. FINDINGS: Endotracheal and nasogastric tubes are in good position. Left-sided PICC line is noted with tip in expected position of the SVC. No pneumothorax or pleural effusion is noted. Minimal bibasilar subsegmental atelectasis is noted. There is increased prominence involving the right paramediastinal and perihilar regions since prior exam. Bony thorax is unremarkable. IMPRESSION: Endotracheal and nasogastric tubes in good position. Increased prominence involving right paramediastinal and perihilar regions since prior exam. CT scan of the chest with Intravenous contrast is recommended for further evaluation. These results will be called to the ordering clinician or representative by the Radiologist Assistant, and communication documented in the PACS or zVision Dashboard. Electronically Signed   By: Marijo Conception M.D.   On: 02/18/2020 09:48

## 2020-02-20 NOTE — Progress Notes (Signed)
Neurology Progress Note   S:// Patient seen and examined Continues to be extremely encephalopathic Postop day 3 status post resection of the left parietal tumor continues to be encephalopathic and not extubated. No obvious clinical seizures noted.  Overnight LTM EEG read pending at this time.  O:// Current vital signs: BP 125/83   Pulse 64   Temp 97.8 F (36.6 C) (Axillary)   Resp 18   Ht 6' 3"  (1.905 m)   Wt (!) 140.3 kg   SpO2 96%   BMI 38.66 kg/m  Vital signs in last 24 hours: Temp:  [97.7 F (36.5 C)-98.2 F (36.8 C)] 97.8 F (36.6 C) (12/04 0400) Pulse Rate:  [61-112] 64 (12/04 0700) Resp:  [11-22] 18 (12/04 0700) BP: (107-167)/(51-94) 125/83 (12/04 0700) SpO2:  [91 %-100 %] 96 % (12/04 0735) Arterial Line BP: (78-142)/(42-93) 114/85 (12/04 0500) FiO2 (%):  [40 %] 40 % (12/04 0735) General: Sedated on Versed, propofol, intubated HEENT: Left scalp incision warm dry intact, normocephalic CVS: Regular rhythm Respiratory: Intubated Abdomen: Obese, nondistended Neurological exam Patient is sedated and intubated Laying in bed Spontaneously moving all fours He does not open eyes to voice but to minimal tactile stimulation, starts moving all 4 extremities His pupils are pinpoint bilaterally He has intact corneals bilaterally His breathing over the ventilator He does not follow any commands To noxious stimulation, symmetric localization with upper extremities bilaterally.  To noxious immolation lower extremities, symmetric withdrawal.  Medications  Current Facility-Administered Medications:  .  acetaminophen (TYLENOL) tablet 650 mg, 650 mg, Per Tube, Q4H PRN **OR** acetaminophen (TYLENOL) suppository 650 mg, 650 mg, Rectal, Q4H PRN, Agarwala, Ravi, MD .  chlorhexidine gluconate (MEDLINE KIT) (PERIDEX) 0.12 % solution 15 mL, 15 mL, Mouth Rinse, BID, Agarwala, Ravi, MD, 15 mL at 02/19/20 2035 .  Chlorhexidine Gluconate Cloth 2 % PADS 6 each, 6 each, Topical, Daily,  Kipp Brood, MD, 6 each at 02/20/20 0200 .  dexamethasone (DECADRON) injection 2 mg, 2 mg, Intravenous, Q6H, [name], MD, 2 mg at 02/20/20 0606 .  docusate (COLACE) 50 MG/5ML liquid 100 mg, 100 mg, Per Tube, BID, Agarwala, Ravi, MD, 100 mg at 02/19/20 2126 .  enalaprilat (VASOTEC) injection 1.25 mg, 1.25 mg, Intravenous, Q6H PRN, [name], MD .  enoxaparin (LOVENOX) injection 40 mg, 40 mg, Subcutaneous, Q24H, [name], MD, 40 mg at 02/19/20 0850 .  feeding supplement (OSMOLITE 1.5 CAL) liquid 1,000 mL, 1,000 mL, Per Tube, Continuous, Ramaswamy, Murali, MD, Last Rate: 55 mL/hr at 02/20/20 0700, Rate Verify at 02/20/20 0700 .  feeding supplement (PROSource TF) liquid 90 mL, 90 mL, Per Tube, TID, Brand Males, MD, 90 mL at 02/19/20 2126 .  fentaNYL (SUBLIMAZE) bolus via infusion 50 mcg, 50 mcg, Intravenous, Q30 min PRN, Agarwala, Ravi, MD .  fentaNYL 2531mg in NS 2530m(1078mml) infusion-PREMIX, 0-400 mcg/hr, Intravenous, Continuous, Agarwala, Ravi, MD, Last Rate: 20 mL/hr at 02/20/20 0700, 200 mcg/hr at 02/20/20 0700 .  insulin aspart (novoLOG) injection 0-15 Units, 0-15 Units, Subcutaneous, Q4H, ThoVallarie MareD, 2 Units at 02/20/20 0357 .  labetalol (NORMODYNE) injection 10 mg, 10 mg, Intravenous, Q10 min PRN, ThoVallarie MareD .  lacosamide (VIMPAT) 100 mg in sodium chloride 0.9 % 25 mL IVPB, 100 mg, Intravenous, Q12H, ThoVallarie MareD, Stopped at 02/19/20 2158 .  lactated ringers bolus 500 mL, 500 mL, Intravenous, Once, Ramaswamy, Murali, MD .  MEDLINE mouth rinse, 15 mL, Mouth Rinse, 10 times per day, AgaKipp BroodD,  15 mL at 02/20/20 0607 .  midazolam (VERSED) 50 mg/50 mL (1 mg/mL) premix infusion, 0.5-10 mg/hr, Intravenous, Continuous, Agarwala, Ravi, MD, Last Rate: 8 mL/hr at 02/20/20 0700, 8 mg/hr at 02/20/20 0700 .  midazolam (VERSED) bolus via infusion 2 mg, 2 mg, Intravenous, Q30 min PRN, Agarwala, Ravi, MD .  nicardipine  (CARDENE) 61m in 0.86% saline 2060mIV infusion (0.1 mg/ml), 3-15 mg/hr, Intravenous, Continuous, ThVallarie MareMD .  ondansetron ([facility]Caprock Hospitaltablet 4 mg, 4 mg, Per Tube, Q4H PRN **OR** ondansetron (ZOFRAN) injection 4 mg, 4 mg, Intravenous, Q4H PRN, Agarwala, Ravi, MD .  pantoprazole sodium (PROTONIX) 40 mg/20 mL oral suspension 40 mg, 40 mg, Per Tube, Daily, Agarwala, Ravi, MD, 40 mg at 02/19/20 0931 .  PHENobarbital (LUMINAL) tablet 64.8 mg, 64.8 mg, Oral, BID, Agarwala, Ravi, MD, 64.8 mg at 02/19/20 2126 .  polyethylene glycol (MIRALAX / GLYCOLAX) packet 17 g, 17 g, Per Tube, Daily PRN, Agarwala, Ravi, MD .  promethazine (PHENERGAN) tablet 12.5-25 mg, 12.5-25 mg, Per Tube, Q4H PRN, Agarwala, Ravi, MD .  propofol (DIPRIVAN) 1000 MG/100ML infusion, 5-80 mcg/kg/min, Intravenous, Titrated, Agarwala, Ravi, MD, Last Rate: 16.8 mL/hr at 02/20/20 0710, 20 mcg/kg/min at 02/20/20 0710 .  QUEtiapine (SEROQUEL) tablet 100 mg, 100 mg, Per Tube, BID, Agarwala, Ravi, MD, 100 mg at 02/19/20 2126 .  sodium phosphate (FLEET) 7-19 GM/118ML enema 1 enema, 1 enema, Rectal, Once PRN, ThVallarie MareMD Labs CBC    Component Value Date/Time   WBC 10.1 02/19/2020 0450   RBC 4.38 02/19/2020 0450   HGB 13.6 02/19/2020 0450   HCT 40.1 02/19/2020 0450   PLT 209 02/19/2020 0450   MCV 91.6 02/19/2020 0450   MCH 31.1 02/19/2020 0450   MCHC 33.9 02/19/2020 0450   RDW 13.9 02/19/2020 0450   LYMPHSABS 0.5 (L) 02/19/2020 0450   MONOABS 0.8 02/19/2020 0450   EOSABS 0.0 02/19/2020 0450   BASOSABS 0.0 02/19/2020 0450    CMP     Component Value Date/Time   NA 140 02/18/2020 0520   K 3.4 (L) 02/18/2020 0520   CL 106 02/18/2020 0520   CO2 21 (L) 02/18/2020 0520   GLUCOSE 132 (H) 02/18/2020 0520   BUN 24 (H) 02/18/2020 0520   CREATININE 0.97 02/18/2020 0520   CALCIUM 8.0 (L) 02/18/2020 0520   GFRNONAA >60 02/18/2020 0520     Imaging I have reviewed images in epic and the results pertinent to this  consultation are: CT head with and without contrast 2020-02-12-left parietal mass with no change from prior.  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  MRI 2020-02-18 with expected postoperative changes post gross total resection of the enhancing component of the left parietotemporal mass.  Minimal enhancement at the resection cavity margins is probably postoperative but small volume residual tumor is difficult to exclude due to the irregular appearance of the preoperative mass. T2 hyperintensity along the left hippocampus and posterior medial left thalamus may be related to recent seizure activity, postsurgical or rapid progression of the infiltrating tumor.  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  Encephalopathy preceded tumor resection. Repeat MRI yesterday showed the expected postoperative changes but in addition also showed some T2 hyperintensity along the left hippocampus, posterior medial left thalamus which might reflect seizure edema. He was seen by Dr. YaHortense Ramalnitially in consult  and started on Vimpat 100 twice daily. His exam remains extremely encephalopathic with not being on to follow any commands.  Also on phenobarbital 2 spot EEGs have only shown evidence of epileptogenicity from left frontotemporal as well as left hemispheric dysfunction without any seizures Due to ongoing encephalopathy, and no clear explanation, we will continue to monitor him on LTM EEG.  Impression: Left parietotemporal mass Persistent encephalopathy Evaluate for seizures  Recommendations: LTM EEG Continue Vimpat for now-based on LTM EEG results, will adjust antiepileptics Maintain seizure precautions Steroids as a cause of his agitation and encephalopathy have been considered but I  would actually consider giving him an extra dose of Decadron 10 mg tomorrow if he does not show any improvement. Currently on Vimpat and phenobarbital will continue-check phenobarb level. I will also consider adding a third agent if EEG is concerning for seizures. Currently on Versed drip and propofol drip. Further recommendations after LTM EEG results.  -- Amie Portland, MD [facility] Pager: [phone] If 7pm to 7am, please call on call as listed on AMION.  CRITICAL CARE ATTESTATION Performed by: Amie Portland, MD Total critical care time: 31 minutes Critical care time was exclusive of separately billable procedures and treating other patients and/or supervising APPs/Residents/Students Critical care was necessary to treat or prevent imminent or life-threatening deterioration due to persistent encephalopathy, brain mass, evaluation of seizures This patient is critically ill and at significant risk for neurological worsening and/or death and care requires constant monitoring. Critical care was time spent personally by me on the following activities: development of treatment plan with patient and/or surrogate as well as nursing, discussions with consultants, evaluation of patient's response to treatment, examination of patient, obtaining history from patient or surrogate, ordering and performing treatments and interventions, ordering and review of laboratory studies, ordering and review of radiographic studies, pulse oximetry, re-evaluation of patient's condition, participation in multidisciplinary rounds and medical decision making of high complexity in the care of this patient.   ADDENDUM Overnight EEG IMPRESSION: This studyshowed evidence of epileptogenicity arising from left temporalregion. There is also severe diffuse encephalopathy, non specific etiology but likely secondary to sedation. No seizures were seen throughout the recording.   Hold course with current  AEDs.  -- Amie Portland, MD [facility] Pager: [phone] If 7pm to 7am, please call on call as listed on AMION.

## 2020-02-20 NOTE — Procedures (Addendum)
Patient Name: Antonio Glenn  MRN: [id number]  Epilepsy Attending: Lora Havens  Referring Physician/Provider: Dr Amie Portland Duration: 02/19/2020 1119 to 02/20/2020 1119  Patient history: 58 yo M with left parietal brain tumor, recent seizure who has improving but persistent non-focal delirium/encephalopathy. EEG to evaluate for seizure  Level of alertness: Awake, asleep  AEDs during EEG study: Propofol, versed, Phenobarb, Lacosamide  Technical aspects: This EEG study was done with scalp electrodes positioned according to the 10-20 International system of electrode placement. Electrical activity was acquired at a sampling rate of 500Hz  and reviewed with a high frequency filter of 70Hz  and a low frequency filter of 1Hz . EEG data were recorded continuously and digitally stored.   Description: No posterior dominant rhythm was seen. Sleep was characterized by vertex waves, sleep spindles (12 to 14 Hz), maximal frontocentral region. Sharp waves were noted in left temporal region.Continuous 3-5Hz  theta-delta slowing was also noted.Hyperventilation and photic stimulation were not performed.   ABNORMALITY -Sharp waves,  left temporal region -Continuous slow  IMPRESSION: This studyshowed evidence of epileptogenicity arising from left temporal region. There is also severe diffuse encephalopathy, non specific etiology but likely secondary to sedation. No seizures were seen throughout the recording.  Fletcher Ostermiller Barbra Sarks

## 2020-02-20 NOTE — Progress Notes (Signed)
Delayed entry LTM maint complete - no skin breakdown under: Fp1 Fo2 F3 F4 , Addition time spent due to O1 P7 being inaccessable  Assistance had to be obtained from three other nurses to assist with moving pt to the side.

## 2020-02-20 NOTE — Progress Notes (Signed)
Subjective: Patient remains encephalopathic  Objective: Vital signs in last 24 hours: Temp:  [97.7 F (36.5 C)-98.2 F (36.8 C)] 97.8 F (36.6 C) (12/04 0400) Pulse Rate:  [61-112] 64 (12/04 0700) Resp:  [11-22] 18 (12/04 0700) BP: (107-167)/(51-94) 125/83 (12/04 0700) SpO2:  [91 %-100 %] 96 % (12/04 0735) Arterial Line BP: (78-142)/(42-93) 114/85 (12/04 0500) FiO2 (%):  [40 %] 40 % (12/04 0735)  Intake/Output from previous day: 12/03 0701 - 12/04 0700 In: 1815 [I.V.:951.2; NG/GT:793.8; IV Piggyback:70] Out: 9518 [Urine:1305] Intake/Output this shift: No intake/output data recorded.  Physical Exam: Sedated, not following commands.  MAE spontaneously  Lab Results: Recent Labs    02/18/20 0520 02/19/20 0450  WBC 14.5* 10.1  HGB 15.3 13.6  HCT 46.9 40.1  PLT 278 209   BMET Recent Labs    02/17/20 1703 02/18/20 0520  NA 137 140  K 3.5 3.4*  CL  --  106  CO2  --  21*  GLUCOSE  --  132*  BUN  --  24*  CREATININE  --  0.97  CALCIUM  --  8.0*    Studies/Results: MR BRAIN W WO CONTRAST  Result Date: 02/18/2020 CLINICAL DATA:  Brain mass post resection EXAM: MRI HEAD WITHOUT AND WITH CONTRAST TECHNIQUE: Multiplanar, multiecho pulse sequences of the brain and surrounding structures were obtained without and with intravenous contrast. CONTRAST:  51mL GADAVIST GADOBUTROL 1 MMOL/ML IV SOLN COMPARISON:  01/30/2020 FINDINGS: Brain: There are new postoperative changes of left parietotemporal mass resection with a resection cavity containing fluid, blood products, and minimal air. There is mild surrounding diffusion restriction likely reflecting postoperative contusion. Additional pneumocephalus is present primarily along the left frontal convexity. Contrast enhanced imaging is degraded by motion artifact. There is minimal enhancement at the resection cavity margins. Extent of T2 FLAIR hyperintensity remains unchanged. There is persistent partial effacement of the posterior left  lateral ventricle. There is additional mild diffusion and T2 hyperintensity without ADC hypointensity along the left hippocampus and posteromedial left thalamus. Vascular: Major vessel flow voids at the skull base are preserved. Skull and upper cervical spine: Left parietal craniotomy. Normal marrow signal is preserved. Sinuses/Orbits: Paranasal sinuses are aerated. Orbits are unremarkable. Other: Sella is unremarkable.  Mastoid air cells are clear. IMPRESSION: Expected postoperative changes post gross total resection of enhancing component of left parietotemporal mass. Minimal enhancement at the resection cavity margins is probably postoperative but small volume residual tumor is difficult to exclude due to irregular appearance of the preoperative mass. T2 hyperintensity along the left hippocampus and posteromedial left thalamus. May be related to recent seizure activity, postsurgical, or rapid progression of infiltrating tumor. Electronically Signed   By: Macy Mis M.D.   On: 02/18/2020 11:56   DG CHEST PORT 1 VIEW  Result Date: 02/18/2020 CLINICAL DATA:  Endotracheal tube. EXAM: PORTABLE CHEST 1 VIEW COMPARISON:  February 12, 2020. FINDINGS: Endotracheal and nasogastric tubes are in good position. Left-sided PICC line is noted with tip in expected position of the SVC. No pneumothorax or pleural effusion is noted. Minimal bibasilar subsegmental atelectasis is noted. There is increased prominence involving the right paramediastinal and perihilar regions since prior exam. Bony thorax is unremarkable. IMPRESSION: Endotracheal and nasogastric tubes in good position. Increased prominence involving right paramediastinal and perihilar regions since prior exam. CT scan of the chest with Intravenous contrast is recommended for further evaluation. These results will be called to the ordering clinician or representative by the Radiologist Assistant, and communication documented in the PACS or  zVision Dashboard.  Electronically Signed   By: Marijo Conception M.D.   On: 02/18/2020 09:48    Assessment/Plan: Encephalopathy remains.  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  Continuous EEG monitoring, awaiting interpretation.  Will continue sedation, intubation, adjust AED's per Dr. Rory Percy from Neurology.    LOS: 9 days    Peggyann Shoals, MD 02/20/2020, 8:39 AM

## 2020-02-20 NOTE — Progress Notes (Signed)
Patient's bed noticed to show an air flow alarm on the side rail screen. Bariatric bed ordered and patient slid over to the new bed at 2 am after his bath.

## 2020-02-21 DIAGNOSIS — J9600 Acute respiratory failure, unspecified whether with hypoxia or hypercapnia: Secondary | ICD-10-CM | POA: Diagnosis not present

## 2020-02-21 DIAGNOSIS — R4182 Altered mental status, unspecified: Secondary | ICD-10-CM | POA: Diagnosis not present

## 2020-02-21 DIAGNOSIS — G9389 Other specified disorders of brain: Secondary | ICD-10-CM | POA: Diagnosis not present

## 2020-02-21 LAB — CBC WITH DIFFERENTIAL/PLATELET
Abs Immature Granulocytes: 0.38 10*3/uL — ABNORMAL HIGH (ref 0.00–0.07)
Basophils Absolute: 0 10*3/uL (ref 0.0–0.1)
Basophils Relative: 0 %
Eosinophils Absolute: 0.1 10*3/uL (ref 0.0–0.5)
Eosinophils Relative: 1 %
HCT: 35.6 % — ABNORMAL LOW (ref 39.0–52.0)
Hemoglobin: 11.8 g/dL — ABNORMAL LOW (ref 13.0–17.0)
Immature Granulocytes: 4 %
Lymphocytes Relative: 14 %
Lymphs Abs: 1.3 10*3/uL (ref 0.7–4.0)
MCH: 31.1 pg (ref 26.0–34.0)
MCHC: 33.1 g/dL (ref 30.0–36.0)
MCV: 93.7 fL (ref 80.0–100.0)
Monocytes Absolute: 0.9 10*3/uL (ref 0.1–1.0)
Monocytes Relative: 11 %
Neutro Abs: 6.2 10*3/uL (ref 1.7–7.7)
Neutrophils Relative %: 70 %
Platelets: 255 10*3/uL (ref 150–400)
RBC: 3.8 MIL/uL — ABNORMAL LOW (ref 4.22–5.81)
RDW: 14.2 % (ref 11.5–15.5)
WBC: 8.9 10*3/uL (ref 4.0–10.5)
nRBC: 0 % (ref 0.0–0.2)

## 2020-02-21 LAB — GLUCOSE, CAPILLARY
Glucose-Capillary: 144 mg/dL — ABNORMAL HIGH (ref 70–99)
Glucose-Capillary: 122 mg/dL — ABNORMAL HIGH (ref 70–99)
Glucose-Capillary: 115 mg/dL — ABNORMAL HIGH (ref 70–99)
Glucose-Capillary: 136 mg/dL — ABNORMAL HIGH (ref 70–99)
Glucose-Capillary: 122 mg/dL — ABNORMAL HIGH (ref 70–99)
Glucose-Capillary: 106 mg/dL — ABNORMAL HIGH (ref 70–99)

## 2020-02-21 LAB — AMMONIA: Ammonia: 27 umol/L (ref 9–35)

## 2020-02-21 LAB — URINE CULTURE
Culture: NO GROWTH
Special Requests: NORMAL

## 2020-02-21 LAB — PHOSPHORUS: Phosphorus: 2.5 mg/dL (ref 2.5–4.6)

## 2020-02-21 NOTE — Progress Notes (Signed)
Neurology Progress Note   S:// Patient seen and examined-remains extremely agitated off of sedation. On midazolam 8 mg/h and propofol in addition to fentanyl.   O:// Current vital signs: BP (!) 142/48   Pulse 87   Temp 98.4 F (36.9 C) (Oral)   Resp 18   Ht 6' 3"  (1.905 m)   Wt (!) 140.3 kg   SpO2 99%   BMI 38.66 kg/m  Vital signs in last 24 hours: Temp:  [98 F (36.7 C)-98.6 F (37 C)] 98.4 F (36.9 C) (12/05 0400) Pulse Rate:  [66-103] 87 (12/05 0830) Resp:  [18-21] 18 (12/05 0830) BP: (102-172)/(42-104) 142/48 (12/05 0830) SpO2:  [96 %-100 %] 99 % (12/05 0830) Arterial Line BP: (98-188)/(36-96) 177/65 (12/05 0830) FiO2 (%):  [40 %] 40 % (12/05 0727) General: Sedated on Versed, propofol, intubated HEENT: Left scalp incision warm dry intact, normocephalic CVS: Regular rhythm Respiratory: Intubated Abdomen: Obese, nondistended Neurological exam Patient is sedated and intubated Upon noxious stimulation, opens eyes but does not follow commands. Spontaneously moves all fours after that. His pupils are pinpoint bilaterally He has intact corneals bilaterally His breathing over the ventilator He does not follow any commands To noxious stimulation, symmetric localization with upper extremities bilaterally.  To noxious stimulation lower extremities, symmetric withdrawal.  Medications  Current Facility-Administered Medications:  .  acetaminophen (TYLENOL) tablet 650 mg, 650 mg, Per Tube, Q4H PRN **OR** acetaminophen (TYLENOL) suppository 650 mg, 650 mg, Rectal, Q4H PRN, Agarwala, Ravi, MD .  chlorhexidine gluconate (MEDLINE KIT) (PERIDEX) 0.12 % solution 15 mL, 15 mL, Mouth Rinse, BID, Agarwala, Ravi, MD, 15 mL at 02/21/20 0824 .  Chlorhexidine Gluconate Cloth 2 % PADS 6 each, 6 each, Topical, Daily, Kipp Brood, MD, 6 each at 02/20/20 0200 .  dexamethasone (DECADRON) injection 2 mg, 2 mg, Intravenous, Q6H, [name], MD, 2 mg at 02/21/20 0535 .  docusate  (COLACE) 50 MG/5ML liquid 100 mg, 100 mg, Per Tube, BID, Agarwala, Ravi, MD, 100 mg at 02/20/20 2200 .  enalaprilat (VASOTEC) injection 1.25 mg, 1.25 mg, Intravenous, Q6H PRN, [name], MD .  enoxaparin (LOVENOX) injection 40 mg, 40 mg, Subcutaneous, Q24H, [name], MD, 40 mg at 02/21/20 7782 .  feeding supplement (OSMOLITE 1.5 CAL) liquid 1,000 mL, 1,000 mL, Per Tube, Continuous, Ramaswamy, Murali, MD, Last Rate: 55 mL/hr at 02/21/20 0500, Rate Verify at 02/21/20 0500 .  feeding supplement (PROSource TF) liquid 90 mL, 90 mL, Per Tube, TID, Chase Caller, Murali, MD, 90 mL at 02/20/20 2200 .  fentaNYL (SUBLIMAZE) bolus via infusion 50 mcg, 50 mcg, Intravenous, Q30 min PRN, Kipp Brood, MD, 50 mcg at 02/21/20 0833 .  fentaNYL 25105mg in NS 2577m(1065mml) infusion-PREMIX, 0-400 mcg/hr, Intravenous, Continuous, Agarwala, Ravi, MD, Last Rate: 20 mL/hr at 02/21/20 0800, 200 mcg/hr at 02/21/20 0800 .  insulin aspart (novoLOG) injection 0-15 Units, 0-15 Units, Subcutaneous, Q4H, ThoVallarie MareD, 2 Units at 02/21/20 0831 .  labetalol (NORMODYNE) injection 10 mg, 10 mg, Intravenous, Q10 min PRN, ThoVallarie MareD .  lacosamide (VIMPAT) 100 mg in sodium chloride 0.9 % 25 mL IVPB, 100 mg, Intravenous, Q12H, ThoVallarie MareD, Stopped at 02/20/20 2234 .  MEDLINE mouth rinse, 15 mL, Mouth Rinse, 10 times per day, Agarwala, RavEinar GradD, 15 mL at 02/21/20 0535 .  midazolam (VERSED) 50 mg/50 mL (1 mg/mL) premix infusion, 0.5-10 mg/hr, Intravenous, Continuous, Agarwala, Ravi, MD, Last Rate: 8 mL/hr at 02/21/20 0800, 8 mg/hr at 02/21/20 0800 .  midazolam (VERSED) bolus via infusion 2 mg, 2 mg, Intravenous, Q30 min PRN, Agarwala, Ravi, MD .  nicardipine (CARDENE) 103m in 0.86% saline 208mIV infusion (0.1 mg/ml), 3-15 mg/hr, Intravenous, Continuous, ThVallarie MareMD .  ondansetron (ZCapitola Surgery Centertablet 4 mg, 4 mg, Per Tube, Q4H PRN **OR** ondansetron (ZOFRAN) injection 4 mg, 4 mg,  Intravenous, Q4H PRN, Agarwala, Ravi, MD .  pantoprazole sodium (PROTONIX) 40 mg/20 mL oral suspension 40 mg, 40 mg, Per Tube, Daily, Agarwala, Ravi, MD, 40 mg at 02/20/20 1042 .  PHENobarbital (LUMINAL) tablet 64.8 mg, 64.8 mg, Oral, Daily, ArAmie PortlandMD .  PHENobarbital (LUMINAL) tablet 97.2 mg, 97.2 mg, Oral, QHS, ArAmie PortlandMD .  polyethylene glycol (MIRALAX / GLYCOLAX) packet 17 g, 17 g, Per Tube, Daily PRN, AgKipp BroodMD, 17 g at 02/20/20 1551 .  promethazine (PHENERGAN) tablet 12.5-25 mg, 12.5-25 mg, Per Tube, Q4H PRN, Agarwala, Ravi, MD .  propofol (DIPRIVAN) 1000 MG/100ML infusion, 5-80 mcg/kg/min, Intravenous, Titrated, Agarwala, Ravi, MD, Last Rate: 25.2 mL/hr at 02/21/20 0836, 30 mcg/kg/min at 02/21/20 0836 .  QUEtiapine (SEROQUEL) tablet 100 mg, 100 mg, Per Tube, BID, Agarwala, Ravi, MD, 100 mg at 02/20/20 2200 .  sodium phosphate (FLEET) 7-19 GM/118ML enema 1 enema, 1 enema, Rectal, Once PRN, ThVallarie MareMD Labs CBC    Component Value Date/Time   WBC 8.9 02/21/2020 0540   RBC 3.80 (L) 02/21/2020 0540   HGB 11.8 (L) 02/21/2020 0540   HCT 35.6 (L) 02/21/2020 0540   PLT 255 02/21/2020 0540   MCV 93.7 02/21/2020 0540   MCH 31.1 02/21/2020 0540   MCHC 33.1 02/21/2020 0540   RDW 14.2 02/21/2020 0540   LYMPHSABS 1.3 02/21/2020 0540   MONOABS 0.9 02/21/2020 0540   EOSABS 0.1 02/21/2020 0540   BASOSABS 0.0 02/21/2020 0540    CMP     Component Value Date/Time   NA 140 02/20/2020 0933   K 4.1 02/20/2020 0933   CL 107 02/20/2020 0933   CO2 25 02/20/2020 0933   GLUCOSE 120 (H) 02/20/2020 0933   BUN 25 (H) 02/20/2020 0933   CREATININE 0.60 (L) 02/20/2020 0933   CALCIUM 8.1 (L) 02/20/2020 0933   PROT 5.3 (L) 02/20/2020 0933   ALBUMIN 2.6 (L) 02/20/2020 0933   AST 25 02/20/2020 0933   ALT 66 (H) 02/20/2020 0933   ALKPHOS 54 02/20/2020 0933   BILITOT 0.8 02/20/2020 0933   GFRNONAA >60 02/20/2020 0933     Imaging I have reviewed images in epic and  the results pertinent to this consultation are: CT head with and without contrast 2020-02-12-left parietal mass with no change from prior.  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  MRI 2020-02-18 with expected postoperative changes post gross total resection of the enhancing component of the left parietotemporal mass.  Minimal enhancement at the resection cavity margins is probably postoperative but small volume residual tumor is difficult to exclude due to the irregular appearance of the preoperative mass. T2 hyperintensity along the left hippocampus and posterior medial left thalamus may be related to recent seizure activity, postsurgical or rapid progression of the infiltrating tumor.  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 He continues to remain extremely encephalopathic today as well.  Repeat MRI 2 days ago showed the expected postoperative changes but in addition also  showed some T2 hyperintensity along the left hippocampus, posterior medial left thalamus which might reflect seizure edema. He was seen by Dr. Hortense Ramal initially in consult and started on Vimpat 100 twice daily. His exam remains extremely encephalopathic with not being on to follow any commands.  Also on phenobarbital-dose adjusted yesterday. 2 spot EEGs have only shown evidence of epileptogenicity from left frontotemporal as well as left hemispheric dysfunction without any seizures Due to ongoing encephalopathy, and no clear explanation, we will continue to monitor him on LTM EEG.  I suspect, based also on the op report that his tumor spread is likely more than what meets the eye on imaging.  Impression: Left parietotemporal mass Persistent encephalopathy Evaluate for  seizures  Recommendations: Continue LTM EEG Continue Vimpat for now Maintain seizure precautions I would favor increasing steroids-to dexamethasone 4 mg every 6 hours-I did discuss this with the neurosurgery team NP who is going to discuss it with attending on rounds. Continue phenobarbital-levels were adjusted yesterday. I will also consider adding a third agent if EEG is concerning for seizures.  Overnight read pending. Check ammonia levels  Will update after LTM EEG report is made available if there are any new recommendations. Neurology will follow -- Amie Portland, MD [facility] Pager: [phone] If 7pm to 7am, please call on call as listed on AMION.  CRITICAL CARE ATTESTATION Performed by: Amie Portland, MD Total critical care time: 38 minutes Critical care time was exclusive of separately billable procedures and treating other patients and/or supervising APPs/Residents/Students Critical care was necessary to treat or prevent imminent or life-threatening deterioration due to persistent encephalopathy, brain mass, evaluation of seizures This patient is critically ill and at significant risk for neurological worsening and/or death and care requires constant monitoring. Critical care was time spent personally by me on the following activities: development of treatment plan with patient and/or surrogate as well as nursing, discussions with consultants, evaluation of patient's response to treatment, examination of patient, obtaining history from patient or surrogate, ordering and performing treatments and interventions, ordering and review of laboratory studies, ordering and review of radiographic studies, pulse oximetry, re-evaluation of patient's condition, participation in multidisciplinary rounds and medical decision making of high complexity in the care of this patient.

## 2020-02-21 NOTE — Procedures (Addendum)
Patient Name:Antonio Glenn ZWC:[id number] Epilepsy Attending:Zarah Carbon Barbra Sarks Referring Physician/Provider:Dr Amie Portland Duration:02/20/2020 1119 to 02/21/2020 1119  Patient history:59 yo M with left parietal brain tumor, recent seizure who has improving but persistent non-focal delirium/encephalopathy.EEG to evaluate for seizure  Level of alertness:Awake, asleep  AEDs during EEG study:Propofol, versed, Phenobarb, Lacosamide  Technical aspects: This EEG study was done with scalp electrodes positioned according to the 10-20 International system of electrode placement. Electrical activity was acquired at a sampling rate of 500Hz  and reviewed with a high frequency filter of 70Hz  and a low frequency filter of 1Hz . EEG data were recorded continuously and digitally stored.   Description:Noposterior dominant rhythmwas seen.Sleep was characterized by vertex waves, sleep spindles (12 to 14 Hz), maximal frontocentral region. Sharp waveswere noted in left temporal region.Continuous generalized 6-8Hz  theta-alpha as well as intermittent 2-3hz  delta slowing was also noted.Hyperventilation and photic stimulation were not performed.   ABNORMALITY -Sharp waves,left temporal region -Continuous slow  IMPRESSION: This studyshowed evidence of epileptogenicity arising from left temporalregion. There is also severe diffuse encephalopathy, non specific etiology but likely secondary to sedation. No seizures were seen throughout the recording.  Charliene Inoue Barbra Sarks

## 2020-02-21 NOTE — Progress Notes (Addendum)
LTM maint complete - no skin breakdown under:  Fp2 A2 F4 Event button tested. Monitored by [facility].

## 2020-02-21 NOTE — Progress Notes (Signed)
NAME:  Antonio Glenn, MRN:  [id number], DOB:  Sep 02, 1960, LOS: 10 ADMISSION DATE:  02/11/2020, CONSULTATION DATE:  02/11/20 REFERRING MD:  Viona Gilmore  CHIEF COMPLAINT:  AMS / Seizures   Brief History   Antonio Glenn is a 59 y.o. male with hx of recently diagnosed brain tumor who was transferred from [place] ED to [facility] 11/25 with AMS and seizures.  He is scheduled for left craniotomy on Dec 1 with Dr. Marcello Moores.  Past Medical History  Brain Mass.  Significant Hospital Events   11/25 > admit.  12/1 -  CRANIOTOMY OF  LEFT PARIETAL TUMOR   12./3 - NSGY recommending c-EEG givne "subtle left parahippocampal/hippocampal T2 hyperintensity suggestive of recent seizures (this signal was not apparent 3 weeks ago, so new tumor infiltration in this area would be less likely)"> they are reducing steroids. On fent gtt/versed gtt -> and still restless moving all 4s. Opens eyes to voice but does not follow commands. RN asking about starting TF and removing foley. Some days ago had hematuria after self dc of foley. RN planning for enema. RD here for cortrak  12/4 -  ongoing eeg. On vent. On fent gtt, versed gtt and diprivan gtt. Restless when diprivan lowered to < 20 and hypotensive when diprivan increased to > 22mcg/min though sedated. CT chest pending. NO issuss after foley removal  Consults:  Neurosurgery  Procedures:  None.  Significant Diagnostic Tests:  CT head 11/25 > 6 - 7cm mass in left parietal lobe with areas of internal cystic necrosis.  Mild regional mass effect with no MLS.     EEG 11/25 >    CT Chest 12/4 - IMPRESSION: 1. Marked dilatation of the ascending thoracic aorta to 4.7 cm remaining dilated at the distal aortic arch up to 3.6 cm returning to a more normal caliber of 3 cm by the diaphragmatic hiatus. No acute luminal abnormality is evident within the limitations of this non angiographic exam. Ascending thoracic aortic aneurysm. Recommend semi-annual imaging followup by CTA or  MRA and referral to cardiothoracic surgery if not already obtained. This recommendation follows 2010 ACCF/AHA/AATS/ACR/ASA/SCA/SCAI/SIR/STS/SVM Guidelines for the Diagnosis and Management of Patients With Thoracic Aortic Disease. Circulation. 2010; 121: T557-D220. Aortic aneurysm NOS (ICD10-I71.9) 2. Cardiomegaly with predominantly left heart enlargement. 3. No discrete mediastinal mass or adenopathy. Contour abnormality in widening likely related to the aneurysm and cardiomegaly above. 4. Bilateral pleural effusions with some adjacent areas of passive atelectatic change. Additional areas of subsegmental atelectatic collapse are seen in the posterior segment right upper lobe and posterior segment right lower lobe. Underlying airspace disease is difficult to exclude fully. 5. Endotracheal intubation with the tip of endotracheal to 4.8 cm from the carina. 6. Transesophageal tube in place with tip and side port beyond the margins of imaging.   Electronically Signed   By: Lovena Le M.D.   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 c-EEG ongoing - > This studyshowed evidence of epileptogenicity arising from left temporalregion. There is also severe diffuse encephalopathy, non specific etiology but likely secondary to sedation. On vent. Gets agitated easy. On Diprivan gtt, versed gtt, fentgtt. 40% fio2 on vent  CT chest - NO mediastinal mass but has  Marked dilatation of the ascending thoracic aorta to  4.7 cm and small pleural effusion.    Objective:  Blood pressure (!) 103/57, pulse 67, temperature 98.7 F (37.1 C), temperature source Axillary, resp. rate 18, height 6\' 3"  (1.905 m), weight (!) 140.3 kg, SpO2 100 %.    Vent Mode: PRVC FiO2 (%):  [40 %] 40 % Set Rate:  [18 bmp] 18 bmp Vt  Set:  [670 mL] 670 mL PEEP:  [5 cmH20] 5 cmH20 Plateau Pressure:  [18 cmH20-19 cmH20] 19 cmH20   Intake/Output Summary (Last 24 hours) at 02/21/2020 1209 Last data filed at 02/21/2020 1100 Gross per 24 hour  Intake 2006.88 ml  Output 930 ml  Net 1076.88 ml   Filed Weights   02/17/20 1152 02/18/20 0500 02/19/20 0400  Weight: (!) 140 kg (!) 138.3 kg (!) 140.3 kg     General Appearance:  Looks criticall ill OBESE - + Head:  Normocephalic, without obvious abnormality, atraumatic Eyes:  PERRL - yes, conjunctiva/corneas - muddy     Ears:  Normal external ear canals, both ears Nose:  G tube - no Throat:  ETT TUBE - yes , OG tube - yes Neck:  Supple,  No enlargement/tenderness/nodules Lungs: Clear to auscultation bilaterally, Ventilator   Synchrony - yes, 40% Heart:  S1 and S2 normal, no murmur, CVP - no.  Pressors - no Abdomen:  Soft, no masses, no organomegaly Genitalia / Rectal:  Not done Extremities:  Extremities- intact Skin:  ntact in exposed areas . Sacral area - not examined Neurologic:  Sedation - 3 sedation gtt -> RASS - -4          Assessment & Plan:  Acute Resp failure due to brain mass and acute encephalopathy No mediastinal mass on CT chest 02/20/20   02/21/2020 - > does not meet criteria for SBT/Extubation in setting of Acute Respiratory Failure due to encephalopathy No evidence of mediastinal mass   Plan PRVC VAP bundele   Acute encephalopathy/hyperactive delirium due to left parietal tumor, requiring titration of IV sedative infusionstumor resection/craniotomy for a scheduled on 12/1 by Dr. 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 Prelim GBM per Dr Vertell Limber on 02/20/20. RASS -4 but having lot of breakthrough agitation. c-EEG ongoing. On triple sedation gtt  Plan - fent gtt  - versed gtt  - add diprivan gtt  - c-EEG per NSGY And neuro sconsult - continue  Phenobarbital. - contnue  vimpat  -Continue dexamethasone per neuro//nsgy - reduced to 2mg  Q6h on 02/21/20 - contnue seroquel -Continue lorazepam as needed for seizure activity - RASS goal 0 to -2  PAF - A Fib hx Hypertention  02/21/2020 - HR  67  in sinus. QTC 452 msec on 02/19/20. Cardizem seems of MAR  plan Holding full dose anticoagulation due to recent brain surgery - timing restart per NSGY On low dose loveno 40mg  q24h prophy Has cardene on standby Labetalol and ENalapril prn for BP control   Thoracic Aorta Aneurysm - new 02/20/20  Plan  - repeat CT chest in 6 months  Anemia of ICU  02/21/2020  - no bleeding  Plan   - - PRBC for hgb </= 6.9gm%    - exceptions are   -  if ACS susepcted/confirmed then transfuse for hgb </= 8.0gm%,  or    -  active bleeding with hemodynamic instability, then transfuse regardless of hemoglobin value   At at all times try to transfuse 1 unit prbc as possible with exception  of active hemorrhage  REcurrent Hematuria when foley dc'ed - last noted 02/20/20 - 2nd episode. Suspect traumnatic  02/21/2020 - foley back. No fever  Plan  - keep foley in and monitor   Daily Goals Checklist  Pain/Anxiety/Delirium protocol (if indicated):  VAP protocol (if indicated): yes Blood pressure target: Keep systolic blood pressure less than 170 DVT prophylaxis: low dose lovenox 02/19/20 Nutrition Status: cortrak   TF  GI prophylaxis: Pantoprazole 40 mg daily Fluid status goals: Allow autoregulation Urinary catheter: Foley dC 02/19/20 and monitor -> urology consult if problems (per daughter no known prior urologica problems) Central lines: None Glucose control: ssi Mobility/therapy needs: Bedrest, continues to require restraints for patient safety Antibiotic de-escalation: On no antibiotic Home medication reconciliation: On hold Code Status: Full Family Communication: Daughter Shiven Junious -> [phone] - Updated 02/19/20 and 02/20/20 and 02/21/20  Disposition: ICU    MDT  Goals of Care Discussion -   Date of Discussion   Primary service for patient PCCM  Location of discussion 4N  Family and Staff present   Summary of discussion MD only update on 02/19/2020 and 02/20/20   Followup goals of care due by 02/18/20  Misc comments if any            ATTESTATION & SIGNATURE   The patient Antonio Glenn is critically ill with multiple organ systems failure and requires high complexity decision making for assessment and support, frequent evaluation and titration of therapies, application of advanced monitoring technologies and extensive interpretation of multiple databases.   Critical Care Time devoted to patient care services described in this note is  45  Minutes. This time reflects time of care of this signee Dr Brand Males. This critical care time does not reflect procedure time, or teaching time or supervisory time of PA/NP/Med student/Med Resident etc but could involve care discussion time     Dr. Brand Males, M.D., [employer].C.P Pulmonary and Critical Care Medicine Staff Physician [facility] Pager: [phone], If no answer or between  15:00h - 7:00h: call [phone]  02/21/2020 12:09 PM    LABS    PULMONARY Recent Labs  Lab 02/14/20 1627 02/17/20 1533 02/17/20 1703  PHART  --  7.344* 7.371  PCO2ART  --  45.9 43.6  PO2ART  --  72* 275*  HCO3 24.1 25.0 25.3  TCO2  --  26 27  O2SAT 80.4 93.0 100.0    CBC Recent Labs  Lab 02/19/20 0450 02/20/20 0933 02/21/20 0540  HGB 13.6 13.3 11.8*  HCT 40.1 40.1 35.6*  WBC 10.1 9.5 8.9  PLT 209 242 255    COAGULATION Recent Labs  Lab 02/17/20 0121  INR 1.2    CARDIAC  No results for input(s): TROPONINI in the last 168 hours. No results for input(s): PROBNP in the last 168 hours.   CHEMISTRY Recent Labs  Lab 02/15/20 1850 02/15/20 1850 02/16/20 0507 02/16/20 0507 02/16/20 1643 02/17/20 0530 02/17/20 0530 02/17/20 1533  02/17/20 1533 02/17/20 1703 02/17/20 1703 02/18/20 0520 02/20/20 0933 02/21/20 0540  NA  --   --  135   < >  --  141  --  137  --  137  --  140 140  --   K  --   --  3.3*   < >  --  3.9   < > 3.5   < > 3.5   < > 3.4* 4.1  --  CL  --   --  107  --   --  109  --   --   --   --   --  106 107  --   CO2  --   --  22  --   --  24  --   --   --   --   --  21* 25  --   GLUCOSE  --   --  151*  --   --  145*  --   --   --   --   --  132* 120*  --   BUN  --   --  19  --   --  19  --   --   --   --   --  24* 25*  --   CREATININE  --   --  0.54*  --   --  0.67  --   --   --   --   --  0.97 0.60*  --   CALCIUM  --   --  6.9*  --   --  7.1*  --   --   --   --   --  8.0* 8.1*  --   MG 2.3  --  2.0  --  2.3 2.1  --   --   --   --   --   --  2.2  --   PHOS 2.7   < > 2.5  --  2.7 3.0  --   --   --   --   --   --  2.9 2.5   < > = values in this interval not displayed.   Estimated Creatinine Clearance: 148.3 mL/min (A) (by C-G formula based on SCr of 0.6 mg/dL (L)).   LIVER Recent Labs  Lab 02/17/20 0121 02/20/20 0933  AST  --  25  ALT  --  66*  ALKPHOS  --  54  BILITOT  --  0.8  PROT  --  5.3*  ALBUMIN  --  2.6*  INR 1.2  --      INFECTIOUS No results for input(s): LATICACIDVEN, PROCALCITON in the last 168 hours.   ENDOCRINE CBG (last 3)  Recent Labs    02/20/20 2340 02/21/20 0347 02/21/20 0828  GLUCAP 95 144* 122*         IMAGING x48h  - image(s) personally visualized  -   highlighted in bold CT CHEST W CONTRAST  Result Date: 02/20/2020 CLINICAL DATA:  Assess for mediastinal mass. Known brain malignancy post resection. EXAM: CT CHEST WITH CONTRAST TECHNIQUE: Multidetector CT imaging of the chest was performed during intravenous contrast administration. CONTRAST:  21mL OMNIPAQUE IOHEXOL 300 MG/ML  SOLN COMPARISON:  Radiograph 02/20/2020 FINDINGS: Cardiovascular: Cardiomegaly with predominantly left heart enlargement. No pericardial effusion. Marked dilatation of the ascending  thoracic aorta to 4.7 cm remaining dilated at the aortic arch up to 3.6 cm returning to a more normal caliber of 3 cm by the diaphragmatic hiatus. No acute luminal abnormality is evident. No periaortic stranding or hemorrhage. Normal 3 vessel branching of the aortic arch with mild tortuosity of brachiocephalic vasculature but otherwise unremarkable appearance of the proximal great vessels. Central pulmonary arteries are normal caliber. No large central filling defects. Left upper extremity PICC tip terminates in the lower SVC. No other major venous abnormalities are seen. Mediastinum/Nodes: No focal mediastinal mass mediastinal fluid or gas. Normal thyroid gland and thoracic inlet. Endotracheal intubation  with the tip of endotracheal to 4.8 cm from the carina. Transesophageal tube is in place as well terminating below the margins of imaging, with tip and side port beyond the GE junction. No acute abnormality of the trachea or esophagus. No worrisome mediastinal, hilar or axillary adenopathy. Lungs/Pleura: Bilateral pleural effusions with some adjacent areas passive atelectatic change. Additional areas of subsegmental atelectatic collapse are seen in the posterior segment right upper lobe and posterior segment right lower lobe. No pneumothorax. No concerning pulmonary nodules or masses are seen. Upper Abdomen: No acute abnormalities present in the visualized portions of the upper abdomen. Transesophageal tube in place. Musculoskeletal: Cervical spondylitic changes. Degenerative changes in the shoulders and thoracic spine are present as well. No acute or worrisome osseous lesions. No suspicious chest wall lesions. IMPRESSION: 1. Marked dilatation of the ascending thoracic aorta to 4.7 cm remaining dilated at the distal aortic arch up to 3.6 cm returning to a more normal caliber of 3 cm by the diaphragmatic hiatus. No acute luminal abnormality is evident within the limitations of this non angiographic exam. Ascending  thoracic aortic aneurysm. Recommend semi-annual imaging followup by CTA or MRA and referral to cardiothoracic surgery if not already obtained. This recommendation follows 2010 ACCF/AHA/AATS/ACR/ASA/SCA/SCAI/SIR/STS/SVM Guidelines for the Diagnosis and Management of Patients With Thoracic Aortic Disease. Circulation. 2010; 121: U981-X914. Aortic aneurysm NOS (ICD10-I71.9) 2. Cardiomegaly with predominantly left heart enlargement. 3. No discrete mediastinal mass or adenopathy. Contour abnormality in widening likely related to the aneurysm and cardiomegaly above. 4. Bilateral pleural effusions with some adjacent areas of passive atelectatic change. Additional areas of subsegmental atelectatic collapse are seen in the posterior segment right upper lobe and posterior segment right lower lobe. Underlying airspace disease is difficult to exclude fully. 5. Endotracheal intubation with the tip of endotracheal to 4.8 cm from the carina. 6. Transesophageal tube in place with tip and side port beyond the margins of imaging. Electronically Signed   By: Lovena Le M.D.   On: 02/20/2020 18:06   DG CHEST PORT 1 VIEW  Result Date: 02/20/2020 CLINICAL DATA:  Check endotracheal tube placement EXAM: PORTABLE CHEST 1 VIEW COMPARISON:  02/18/2020 FINDINGS: Endotracheal tube, feeding catheter and left-sided PICC line are again noted and stable. Overall inspiratory effort is poor. No focal infiltrate is seen. Cardiac shadow is stable. Prominence of the mediastinum to the right of the midline is seen and stable although likely related to patient rotation. IMPRESSION: Stable appearance of the chest when compared with the prior exam. Electronically Signed   By: Inez Catalina M.D.   On: 02/20/2020 09:23   EEG LTVM - Continuous Bedside W/ Video Includes Portable EEG Read  Result Date: 02/20/2020 Lora Havens, MD     02/21/2020  9:29 AM Patient Name: Antonio Glenn MRN: [id number] Epilepsy Attending: Lora Havens Referring  Physician/Provider: Dr Amie [place] Duration: 02/19/2020 1119 to 02/20/2020 1119  Patient history: 59 yo M with left parietal brain tumor, recent seizure who has improving but persistent non-focal delirium/encephalopathy. EEG to evaluate for seizure  Level of alertness: Awake, asleep  AEDs during EEG study: Propofol, versed, Phenobarb, Lacosamide  Technical aspects: This EEG study was done with scalp electrodes positioned according to the 10-20 International system of electrode placement. Electrical activity was acquired at a sampling rate of 500Hz  and reviewed with a high frequency filter of 70Hz  and a low frequency filter of 1Hz . EEG data were recorded continuously and digitally stored.  Description: No posterior dominant rhythm was seen. Sleep was  characterized by vertex waves, sleep spindles (12 to 14 Hz), maximal frontocentral region. Sharp waves were noted in left temporal region.Continuous 3-5Hz  theta-delta slowing was also noted.Hyperventilation and photic stimulation were not performed.   ABNORMALITY -Sharp waves,  left temporal region -Continuous slow  IMPRESSION: This studyshowed evidence of epileptogenicity arising from left temporal region. There is also severe diffuse encephalopathy, non specific etiology but likely secondary to sedation. No seizures were seen throughout the recording.  Priyanka Barbra Sarks

## 2020-02-21 NOTE — Plan of Care (Signed)
  Problem: Pain Managment: Goal: General experience of comfort will improve Outcome: Progressing   

## 2020-02-21 NOTE — Progress Notes (Signed)
Subjective: Patient reports  sedated and intubated. Agitated with sedation off.  Objective: Vital signs in last 24 hours: Temp:  [98 F (36.7 C)-98.7 F (37.1 C)] 98.7 F (37.1 C) (12/05 0815) Pulse Rate:  [66-103] 91 (12/05 0900) Resp:  [16-21] 16 (12/05 0900) BP: (102-172)/(42-104) 139/98 (12/05 0900) SpO2:  [96 %-100 %] 98 % (12/05 0900) Arterial Line BP: (98-199)/(36-90) 199/90 (12/05 0900) FiO2 (%):  [40 %] 40 % (12/05 0727)  Intake/Output from previous day: 12/04 0701 - 12/05 0700 In: 2634.4 [I.V.:1239.1; NG/GT:825; IV Piggyback:570.3] Out: 700 [Urine:700] Intake/Output this shift: Total I/O In: 245 [I.V.:135; NG/GT:110] Out: 130 [Urine:130]  Physical Exam: Patient is sedated, not following commands.  MAE spontaneously. Incision/dressing CDI. Currently sedated on propofol, midazolam, and Fentanyl  Lab Results: Recent Labs    02/20/20 0933 02/21/20 0540  WBC 9.5 8.9  HGB 13.3 11.8*  HCT 40.1 35.6*  PLT 242 255   BMET Recent Labs    02/20/20 0933  NA 140  K 4.1  CL 107  CO2 25  GLUCOSE 120*  BUN 25*  CREATININE 0.60*  CALCIUM 8.1*    Studies/Results: CT CHEST W CONTRAST  Result Date: 02/20/2020 CLINICAL DATA:  Assess for mediastinal mass. Known brain malignancy post resection. EXAM: CT CHEST WITH CONTRAST TECHNIQUE: Multidetector CT imaging of the chest was performed during intravenous contrast administration. CONTRAST:  12mL OMNIPAQUE IOHEXOL 300 MG/ML  SOLN COMPARISON:  Radiograph 02/20/2020 FINDINGS: Cardiovascular: Cardiomegaly with predominantly left heart enlargement. No pericardial effusion. Marked dilatation of the ascending thoracic aorta to 4.7 cm remaining dilated at the aortic arch up to 3.6 cm returning to a more normal caliber of 3 cm by the diaphragmatic hiatus. No acute luminal abnormality is evident. No periaortic stranding or hemorrhage. Normal 3 vessel branching of the aortic arch with mild tortuosity of brachiocephalic vasculature but  otherwise unremarkable appearance of the proximal great vessels. Central pulmonary arteries are normal caliber. No large central filling defects. Left upper extremity PICC tip terminates in the lower SVC. No other major venous abnormalities are seen. Mediastinum/Nodes: No focal mediastinal mass mediastinal fluid or gas. Normal thyroid gland and thoracic inlet. Endotracheal intubation with the tip of endotracheal to 4.8 cm from the carina. Transesophageal tube is in place as well terminating below the margins of imaging, with tip and side port beyond the GE junction. No acute abnormality of the trachea or esophagus. No worrisome mediastinal, hilar or axillary adenopathy. Lungs/Pleura: Bilateral pleural effusions with some adjacent areas passive atelectatic change. Additional areas of subsegmental atelectatic collapse are seen in the posterior segment right upper lobe and posterior segment right lower lobe. No pneumothorax. No concerning pulmonary nodules or masses are seen. Upper Abdomen: No acute abnormalities present in the visualized portions of the upper abdomen. Transesophageal tube in place. Musculoskeletal: Cervical spondylitic changes. Degenerative changes in the shoulders and thoracic spine are present as well. No acute or worrisome osseous lesions. No suspicious chest wall lesions. IMPRESSION: 1. Marked dilatation of the ascending thoracic aorta to 4.7 cm remaining dilated at the distal aortic arch up to 3.6 cm returning to a more normal caliber of 3 cm by the diaphragmatic hiatus. No acute luminal abnormality is evident within the limitations of this non angiographic exam. Ascending thoracic aortic aneurysm. Recommend semi-annual imaging followup by CTA or MRA and referral to cardiothoracic surgery if not already obtained. This recommendation follows 2010 ACCF/AHA/AATS/ACR/ASA/SCA/SCAI/SIR/STS/SVM Guidelines for the Diagnosis and Management of Patients With Thoracic Aortic Disease. Circulation. 2010; 121:  Y706-C376. Aortic aneurysm  NOS (ICD10-I71.9) 2. Cardiomegaly with predominantly left heart enlargement. 3. No discrete mediastinal mass or adenopathy. Contour abnormality in widening likely related to the aneurysm and cardiomegaly above. 4. Bilateral pleural effusions with some adjacent areas of passive atelectatic change. Additional areas of subsegmental atelectatic collapse are seen in the posterior segment right upper lobe and posterior segment right lower lobe. Underlying airspace disease is difficult to exclude fully. 5. Endotracheal intubation with the tip of endotracheal to 4.8 cm from the carina. 6. Transesophageal tube in place with tip and side port beyond the margins of imaging. Electronically Signed   By: Lovena Le M.D.   On: 02/20/2020 18:06   DG CHEST PORT 1 VIEW  Result Date: 02/20/2020 CLINICAL DATA:  Check endotracheal tube placement EXAM: PORTABLE CHEST 1 VIEW COMPARISON:  02/18/2020 FINDINGS: Endotracheal tube, feeding catheter and left-sided PICC line are again noted and stable. Overall inspiratory effort is poor. No focal infiltrate is seen. Cardiac shadow is stable. Prominence of the mediastinum to the right of the midline is seen and stable although likely related to patient rotation. IMPRESSION: Stable appearance of the chest when compared with the prior exam. Electronically Signed   By: Inez Catalina M.D.   On: 02/20/2020 09:23   EEG LTVM - Continuous Bedside W/ Video Includes Portable EEG Read  Result Date: 02/20/2020 Lora Havens, MD     02/21/2020  9:29 AM Patient Name: Antonio Glenn MRN: [id number] Epilepsy Attending: Lora Havens Referring Physician/Provider: Dr Amie [place] Duration: 02/19/2020 1119 to 02/20/2020 1119  Patient history: 59 yo M with left parietal brain tumor, recent seizure who has improving but persistent non-focal delirium/encephalopathy. EEG to evaluate for seizure  Level of alertness: Awake, asleep  AEDs during EEG study: Propofol, versed,  Phenobarb, Lacosamide  Technical aspects: This EEG study was done with scalp electrodes positioned according to the 10-20 International system of electrode placement. Electrical activity was acquired at a sampling rate of 500Hz  and reviewed with a high frequency filter of 70Hz  and a low frequency filter of 1Hz . EEG data were recorded continuously and digitally stored.  Description: No posterior dominant rhythm was seen. Sleep was characterized by vertex waves, sleep spindles (12 to 14 Hz), maximal frontocentral region. Sharp waves were noted in left temporal region.Continuous 3-5Hz  theta-delta slowing was also noted.Hyperventilation and photic stimulation were not performed.   ABNORMALITY -Sharp waves,  left temporal region -Continuous slow  IMPRESSION: This studyshowed evidence of epileptogenicity arising from left temporal region. There is also severe diffuse encephalopathy, non specific etiology but likely secondary to sedation. No seizures were seen throughout the recording.  Priyanka Barbra Sarks    Assessment/Plan: Patient continues to be encephalopathic. Continuous EEG monitoring for one more day.  Will continue sedation, intubation, adjust AED's per Dr. Rory Percy from Neurology. Continue seizure precautions.  Increase dexamethasone to 4 mg Q6H.    LOS: 10 days   Marvis Moeller, DNP, NP-C 02/21/2020, 9:31 AM

## 2020-02-22 ENCOUNTER — Inpatient Hospital Stay (HOSPITAL_COMMUNITY): Payer: BC Managed Care – PPO

## 2020-02-22 DIAGNOSIS — G9389 Other specified disorders of brain: Secondary | ICD-10-CM | POA: Diagnosis not present

## 2020-02-22 DIAGNOSIS — R4182 Altered mental status, unspecified: Secondary | ICD-10-CM | POA: Diagnosis not present

## 2020-02-22 LAB — CBC WITH DIFFERENTIAL/PLATELET
Abs Immature Granulocytes: 0.67 10*3/uL — ABNORMAL HIGH (ref 0.00–0.07)
Basophils Absolute: 0.1 10*3/uL (ref 0.0–0.1)
Basophils Relative: 1 %
Eosinophils Absolute: 0.1 10*3/uL (ref 0.0–0.5)
Eosinophils Relative: 2 %
HCT: 35.3 % — ABNORMAL LOW (ref 39.0–52.0)
Hemoglobin: 11.6 g/dL — ABNORMAL LOW (ref 13.0–17.0)
Immature Granulocytes: 9 %
Lymphocytes Relative: 15 %
Lymphs Abs: 1.1 10*3/uL (ref 0.7–4.0)
MCH: 30.9 pg (ref 26.0–34.0)
MCHC: 32.9 g/dL (ref 30.0–36.0)
MCV: 94.1 fL (ref 80.0–100.0)
Monocytes Absolute: 0.8 10*3/uL (ref 0.1–1.0)
Monocytes Relative: 10 %
Neutro Abs: 5.1 10*3/uL (ref 1.7–7.7)
Neutrophils Relative %: 63 %
Platelets: 270 10*3/uL (ref 150–400)
RBC: 3.75 MIL/uL — ABNORMAL LOW (ref 4.22–5.81)
RDW: 14.1 % (ref 11.5–15.5)
WBC: 7.9 10*3/uL (ref 4.0–10.5)
nRBC: 0 % (ref 0.0–0.2)

## 2020-02-22 LAB — COMPREHENSIVE METABOLIC PANEL
ALT: 46 U/L — ABNORMAL HIGH (ref 0–44)
AST: 18 U/L (ref 15–41)
Albumin: 2.2 g/dL — ABNORMAL LOW (ref 3.5–5.0)
Alkaline Phosphatase: 58 U/L (ref 38–126)
Anion gap: 7 (ref 5–15)
BUN: 20 mg/dL (ref 6–20)
CO2: 25 mmol/L (ref 22–32)
Calcium: 8.2 mg/dL — ABNORMAL LOW (ref 8.9–10.3)
Chloride: 106 mmol/L (ref 98–111)
Creatinine, Ser: 0.54 mg/dL — ABNORMAL LOW (ref 0.61–1.24)
GFR, Estimated: >60 mL/min (ref >60)
Glucose, Bld: 133 mg/dL — ABNORMAL HIGH (ref 70–99)
Potassium: 4 mmol/L (ref 3.5–5.1)
Sodium: 138 mmol/L (ref 135–145)
Total Bilirubin: 0.8 mg/dL (ref 0.3–1.2)
Total Protein: 4.9 g/dL — ABNORMAL LOW (ref 6.5–8.1)

## 2020-02-22 LAB — GLUCOSE, CAPILLARY
Glucose-Capillary: 128 mg/dL — ABNORMAL HIGH (ref 70–99)
Glucose-Capillary: 140 mg/dL — ABNORMAL HIGH (ref 70–99)
Glucose-Capillary: 89 mg/dL (ref 70–99)
Glucose-Capillary: 101 mg/dL — ABNORMAL HIGH (ref 70–99)
Glucose-Capillary: 110 mg/dL — ABNORMAL HIGH (ref 70–99)
Glucose-Capillary: 129 mg/dL — ABNORMAL HIGH (ref 70–99)

## 2020-02-22 LAB — MAGNESIUM: Magnesium: 2 mg/dL (ref 1.7–2.4)

## 2020-02-22 LAB — PHOSPHORUS: Phosphorus: 3.2 mg/dL (ref 2.5–4.6)

## 2020-02-22 MED ORDER — PHENOBARBITAL 20 MG/5ML PO ELIX
90.0000 mg | ORAL_SOLUTION | Freq: Every day | ORAL | Status: DC
  Administered 2020-02-22 – 2020-02-24 (×3): 90 mg
  Filled 2020-02-22 (×3): qty 22.5

## 2020-02-22 MED ORDER — PHENOBARBITAL 20 MG/5ML PO ELIX
60.0000 mg | ORAL_SOLUTION | Freq: Every day | ORAL | Status: DC
  Administered 2020-02-23 – 2020-02-25 (×3): 60 mg
  Filled 2020-02-22 (×3): qty 15

## 2020-02-22 MED ORDER — DEXAMETHASONE SODIUM PHOSPHATE 4 MG/ML IJ SOLN
2.0000 mg | Freq: Three times a day (TID) | INTRAMUSCULAR | Status: DC
  Administered 2020-02-22 – 2020-02-24 (×7): 2 mg via INTRAVENOUS
  Filled 2020-02-22 (×7): qty 1

## 2020-02-22 NOTE — Progress Notes (Signed)
Subjective: NAEs o/n  Objective: Vital signs in last 24 hours: Temp:  [97.7 F (36.5 C)-98.5 F (36.9 C)] 98.4 F (36.9 C) (12/06 0800) Pulse Rate:  [51-92] 52 (12/06 0700) Resp:  [0-21] 0 (12/06 0700) BP: (98-168)/(39-94) 149/65 (12/06 0700) SpO2:  [97 %-100 %] 98 % (12/06 0735) Arterial Line BP: (70-205)/(59-99) 137/74 (12/06 0400) FiO2 (%):  [30 %-40 %] 30 % (12/06 0735) Weight:  [146.9 kg] 146.9 kg (12/06 0500)  Intake/Output from previous day: 12/05 0701 - 12/06 0700 In: 2841.3 [I.V.:1451.4; NG/GT:1320; IV Piggyback:69.9] Out: 1530 [Urine:1530] Intake/Output this shift: No intake/output data recorded.  Sedated on Propofol, Fentanyl Eyes open to voice, purposeful and symmetric movement x 4, does not follow commands Incision c/d   Lab Results: Recent Labs    02/21/20 0540 02/22/20 0538  WBC 8.9 7.9  HGB 11.8* 11.6*  HCT 35.6* 35.3*  PLT 255 270   BMET Recent Labs    02/20/20 0933 02/22/20 0538  NA 140 138  K 4.1 4.0  CL 107 106  CO2 25 25  GLUCOSE 120* 133*  BUN 25* 20  CREATININE 0.60* 0.54*  CALCIUM 8.1* 8.2*    Studies/Results: CT CHEST W CONTRAST  Result Date: 02/20/2020 CLINICAL DATA:  Assess for mediastinal mass. Known brain malignancy post resection. EXAM: CT CHEST WITH CONTRAST TECHNIQUE: Multidetector CT imaging of the chest was performed during intravenous contrast administration. CONTRAST:  72mL OMNIPAQUE IOHEXOL 300 MG/ML  SOLN COMPARISON:  Radiograph 02/20/2020 FINDINGS: Cardiovascular: Cardiomegaly with predominantly left heart enlargement. No pericardial effusion. Marked dilatation of the ascending thoracic aorta to 4.7 cm remaining dilated at the aortic arch up to 3.6 cm returning to a more normal caliber of 3 cm by the diaphragmatic hiatus. No acute luminal abnormality is evident. No periaortic stranding or hemorrhage. Normal 3 vessel branching of the aortic arch with mild tortuosity of brachiocephalic vasculature but otherwise unremarkable  appearance of the proximal great vessels. Central pulmonary arteries are normal caliber. No large central filling defects. Left upper extremity PICC tip terminates in the lower SVC. No other major venous abnormalities are seen. Mediastinum/Nodes: No focal mediastinal mass mediastinal fluid or gas. Normal thyroid gland and thoracic inlet. Endotracheal intubation with the tip of endotracheal to 4.8 cm from the carina. Transesophageal tube is in place as well terminating below the margins of imaging, with tip and side port beyond the GE junction. No acute abnormality of the trachea or esophagus. No worrisome mediastinal, hilar or axillary adenopathy. Lungs/Pleura: Bilateral pleural effusions with some adjacent areas passive atelectatic change. Additional areas of subsegmental atelectatic collapse are seen in the posterior segment right upper lobe and posterior segment right lower lobe. No pneumothorax. No concerning pulmonary nodules or masses are seen. Upper Abdomen: No acute abnormalities present in the visualized portions of the upper abdomen. Transesophageal tube in place. Musculoskeletal: Cervical spondylitic changes. Degenerative changes in the shoulders and thoracic spine are present as well. No acute or worrisome osseous lesions. No suspicious chest wall lesions. IMPRESSION: 1. Marked dilatation of the ascending thoracic aorta to 4.7 cm remaining dilated at the distal aortic arch up to 3.6 cm returning to a more normal caliber of 3 cm by the diaphragmatic hiatus. No acute luminal abnormality is evident within the limitations of this non angiographic exam. Ascending thoracic aortic aneurysm. Recommend semi-annual imaging followup by CTA or MRA and referral to cardiothoracic surgery if not already obtained. This recommendation follows 2010 ACCF/AHA/AATS/ACR/ASA/SCA/SCAI/SIR/STS/SVM Guidelines for the Diagnosis and Management of Patients With Thoracic Aortic Disease.  Circulation. 2010; 121: Q286-N817. Aortic  aneurysm NOS (ICD10-I71.9) 2. Cardiomegaly with predominantly left heart enlargement. 3. No discrete mediastinal mass or adenopathy. Contour abnormality in widening likely related to the aneurysm and cardiomegaly above. 4. Bilateral pleural effusions with some adjacent areas of passive atelectatic change. Additional areas of subsegmental atelectatic collapse are seen in the posterior segment right upper lobe and posterior segment right lower lobe. Underlying airspace disease is difficult to exclude fully. 5. Endotracheal intubation with the tip of endotracheal to 4.8 cm from the carina. 6. Transesophageal tube in place with tip and side port beyond the margins of imaging. Electronically Signed   By: Lovena Le M.D.   On: 02/20/2020 18:06   DG CHEST PORT 1 VIEW  Result Date: 02/22/2020 CLINICAL DATA:  Respiratory failure. EXAM: PORTABLE CHEST 1 VIEW COMPARISON:  Chest x-ray 02/20/2020 FINDINGS: The endotracheal tube is 3.5 cm above the carina. The feeding tube is coursing down the esophagus and into the stomach. The left PICC line is stable. Streaky basilar atelectasis but no infiltrates, edema or large effusion. No pneumothorax. The bony thorax is intact. IMPRESSION: 1. Stable support apparatus. 2. Streaky basilar atelectasis. Electronically Signed   By: Marijo Sanes M.D.   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 EEG to evaluate for seizure  Level of alertness: Awake, asleep  AEDs during EEG study: Propofol, versed, Phenobarb, Lacosamide  Technical aspects: This EEG study was  done with scalp electrodes positioned according to the 10-20 International system of electrode placement. Electrical activity was acquired at a sampling rate of 500Hz  and reviewed with a high frequency filter of 70Hz  and a low frequency filter of 1Hz . EEG data were recorded continuously and digitally stored.  Description: No posterior dominant rhythm was seen. Sleep was characterized by vertex waves, sleep spindles (12 to 14 Hz), maximal frontocentral region. Sharp waves were noted in left temporal region.Continuous 3-5Hz  theta-delta slowing was also noted.Hyperventilation and photic stimulation were not performed.   ABNORMALITY -Sharp waves,  left temporal region -Continuous slow  IMPRESSION: This studyshowed evidence of epileptogenicity arising from left temporal region. There is also severe diffuse encephalopathy, non specific etiology but likely secondary to sedation. No seizures were seen throughout the recording.  Lora Havens    Assessment/Plan: 59 yo M with left parietal tumor s/p resection with persistent encephalopathy - encephalopathy appears to be epileptic vs medication-related.  If his cEEG shows no seizures or PLEDs and neurology serivce comfortable with it, I believe we should try to wean him from sedative medications, with likely extubation. - he will be on Dex 2 mg q8, which is a sufficient dose to help with suppression of tumor-related epileptic spikes, but generally not so high as to potentially induce steroid psychosis   [name] 02/22/2020, 9:28 AM

## 2020-02-22 NOTE — Progress Notes (Signed)
NAME:  Antonio Glenn, MRN:  [id number], DOB:  06/14/1960, LOS: 11 ADMISSION DATE:  02/11/2020, CONSULTATION DATE:  02/11/20 REFERRING MD:  Viona Gilmore  CHIEF COMPLAINT:  AMS / Seizures   Brief History   Antonio Glenn is a 59 y.o. male with hx of recently diagnosed brain tumor who was transferred from [facility] to [facility] 11/25 with AMS and seizures.  He is scheduled for left craniotomy on Dec 1 with Dr. Marcello Moores.  Past Medical History  Brain Mass.  Significant Hospital Events   11/25 > admit.  12/1 -  CRANIOTOMY OF  LEFT PARIETAL TUMOR   12./3 - NSGY recommending c-EEG given "subtle left parahippocampal/hippocampal T2 hyperintensity suggestive of recent seizures (this signal was not apparent 3 weeks ago, so new tumor infiltration in this area would be less likely)"> they are reducing steroids. On fent gtt/versed gtt -> and still restless moving all 4s. Opens eyes to voice but does not follow commands. RN asking about starting TF and removing foley. Some days ago had hematuria after self dc of foley. RN planning for enema. RD here for cortrak  12/4 -  ongoing eeg. On vent. On fent gtt, versed gtt and diprivan gtt. Restless when diprivan lowered to < 20 and hypotensive when diprivan increased to > 64mcg/min though sedated. CT chest pending. NO issues after foley removal  Consults:  Neurosurgery neuro  Procedures:  12/1: crainiotomy  Significant Diagnostic Tests:  CT head 11/25 > 6 - 7cm mass in left parietal lobe with areas of internal cystic necrosis.  Mild regional mass effect with no MLS.     MRI brain 12/2: Expected postoperative changes post gross total resection of enhancing component of left parietotemporal mass. Minimal enhancement at the resection cavity margins is probably postoperative but small volume residual tumor is difficult to exclude due to irregular appearance of the preoperative mass.  T2 hyperintensity along the left hippocampus and posteromedial left thalamus. May  be related to recent seizure activity, postsurgical, or rapid progression of infiltrating tumor.  CT Chest 12/4 - IMPRESSION: 1. Marked dilatation of the ascending thoracic aorta to 4.7 cm remaining dilated at the distal aortic arch up to 3.6 cm returning to a more normal caliber of 3 cm by the diaphragmatic hiatus. No acute luminal abnormality is evident within the limitations of this non angiographic exam. Ascending thoracic aortic aneurysm. Recommend semi-annual imaging followup by CTA or MRA and referral to cardiothoracic surgery if not already obtained. This recommendation follows 2010 ACCF/AHA/AATS/ACR/ASA/SCA/SCAI/SIR/STS/SVM Guidelines for the Diagnosis and Management of Patients With Thoracic Aortic Disease. Circulation. 2010; 121: P382-N053. Aortic aneurysm NOS (ICD10-I71.9) 2. Cardiomegaly with predominantly left heart enlargement. 3. No discrete mediastinal mass or adenopathy. Contour abnormality in widening likely related to the aneurysm and cardiomegaly above. 4. Bilateral pleural effusions with some adjacent areas of passive atelectatic change. Additional areas of subsegmental atelectatic collapse are seen in the posterior segment right upper lobe and posterior segment right lower lobe. Underlying airspace disease is difficult to exclude fully. 5. Endotracheal intubation with the tip of endotracheal to 4.8 cm from the carina. 6. Transesophageal tube in place with tip and side port beyond the margins of imaging.    Micro Data:  Flu 11/25 > neg COVID 11/25 > neg Urine cx  12/4: neg  Antimicrobials:   cefazolin 12/1 - 12/2 vanc 11/29 - 12/2   Interim history/subjective:    02/22/2020 - increased decadron yesterday. Continues with sharp waves in L temporal region on cEEG but  no frank sz. Ammonia normal. Not following commands this am. Kicking legs oob and feet pulling on foley. Minimal vent settings  12/5: per Dr Vertell Limber yesterday post rounds: prelim path is  GBM. c-EEG ongoing - > This studyshowed evidence of epileptogenicity arising from left temporalregion. There is also severe diffuse encephalopathy, non specific etiology but likely secondary to sedation. On vent. Gets agitated easy. On Diprivan gtt, versed gtt, fentgtt. 40% fio2 on vent   Objective:  Blood pressure (!) 149/65, pulse (!) 52, temperature 98.4 F (36.9 C), temperature source Axillary, resp. rate (!) 0, height 6\' 3"  (1.905 m), weight (!) 146.9 kg, SpO2 98 %.    Vent Mode: PRVC FiO2 (%):  [30 %-40 %] 30 % Set Rate:  [18 bmp] 18 bmp Vt Set:  [670 mL] 670 mL PEEP:  [5 cmH20] 5 cmH20 Plateau Pressure:  [17 cmH20-18 cmH20] 18 cmH20   Intake/Output Summary (Last 24 hours) at 02/22/2020 1937 Last data filed at 02/22/2020 0700 Gross per 24 hour  Intake 2711.41 ml  Output 1465 ml  Net 1246.41 ml   Filed Weights   02/18/20 0500 02/19/20 0400 02/22/20 0500  Weight: (!) 138.3 kg (!) 140.3 kg (!) 146.9 kg     General Appearance:  Looks critically ill, restless supine in bed, OBESE - + Head:  Normocephalic, without obvious abnormality, atraumatic Eyes:  PERRL, conjunctiva injected   Ears:  Normal external ear canals, both ears Throat: ett in place Neck:  Supple,  No enlargement/tenderness/nodules Lungs: Clear to auscultation bilaterally, Ventilator   Synchrony - yes, 30% Heart:  S1 and S2 normal, no murmur Abdomen:  Obese, Soft, no masses, no organomegaly Extremities:  Extremities 5/5 in all 4, no c/c/e Skin:  intact in exposed areas . Sacral area - not examined Neurologic: not following commands. Moving all 4 extremities with great strength though.           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 periodic measuring of qt interval  -Continue lorazepam as needed for seizure activity - RASS goal 0 to -2  PAF - A Fib hx Hypertension: improved plan -Holding full dose anticoagulation due to recent brain surgery - timing restart per NSGY (on chemoprophy however)   Thoracic Aorta Aneurysm - new 02/20/20 Plan  - repeat CT chest in 6 months  Normocytic anemia Plan  -no acute signs of bleeding  Recurrent Hematuria when foley dc'ed - last noted 02/20/20 - 2nd episode. Suspect traumatic Plan  - keep foley in and monitor   Daily Goals Checklist  Pain/Anxiety/Delirium protocol (if indicated): see mar VAP protocol (if indicated): yes Blood pressure target: Keep systolic blood pressure less than 170 DVT prophylaxis: lovenox Nutrition Status: TF  GI prophylaxis protonix Urinary catheter: foley in place Central lines: None Glucose control: ssi Mobility/therapy needs: Bedrest, continues to require restraints for patient safety Code Status: Full Family Communication: pending Disposition: ICU    MDT Goals of Care Discussion -   Date of Discussion   Primary service for patient PCCM  Location of discussion 4N  Family and Staff present   Summary of discussion MD only update on 02/19/2020 and 02/20/20   Followup goals of care due by 02/18/20  Misc comments if any           LABS    PULMONARY Recent Labs  Lab 02/17/20 1533 02/17/20 1703  PHART 7.344* 7.371  PCO2ART 45.9 43.6  PO2ART 72* 275*  HCO3 25.0 25.3  TCO2 26 27  O2SAT 93.0 100.0    CBC Recent Labs  Lab 02/20/20 0933 02/21/20 0540 02/22/20 0538  HGB 13.3 11.8* 11.6*  HCT 40.1 35.6* 35.3*  WBC 9.5 8.9 7.9  PLT 242 255 270     COAGULATION Recent Labs  Lab 02/17/20 0121  INR 1.2    CARDIAC  No results for input(s): TROPONINI in the last 168 hours. No results for input(s): PROBNP in the last 168 hours.   CHEMISTRY Recent Labs  Lab 02/16/20 0507 02/16/20 0507 02/16/20 1643 02/17/20 0530 02/17/20 0530 02/17/20 1533 02/17/20 1533 02/17/20 1703 02/17/20 1703 02/18/20 0520 02/18/20 0520 02/20/20 0933 02/21/20 0540 02/22/20 0538  NA 135   < >  --  141   < > 137  --  137  --  140  --  140  --  138  K 3.3*   < >  --  3.9   < > 3.5   < > 3.5   < > 3.4*   < > 4.1  --  4.0  CL 107  --   --  109  --   --   --   --   --  106  --  107  --  106  CO2 22  --   --  24  --   --   --   --   --  21*  --  25  --  25  GLUCOSE 151*  --   --  145*  --   --   --   --   --  132*  --  120*  --  133*  BUN 19  --   --  19  --   --   --   --   --  24*  --  25*  --  20  CREATININE 0.54*  --   --  0.67  --   --   --   --   --  0.97  --  0.60*  --  0.54*  CALCIUM 6.9*  --   --  7.1*  --   --   --   --   --  8.0*  --  8.1*  --  8.2*  MG 2.0  --  2.3 2.1  --   --   --   --   --   --   --  2.2  --  2.0  PHOS 2.5   < > 2.7 3.0  --   --   --   --   --   --   --  2.9 2.5 3.2   < > = values in this interval not displayed.   Estimated Creatinine Clearance: 152.1 mL/min (A) (by C-G formula based on SCr of 0.54 mg/dL (L)).   LIVER Recent Labs  Lab 02/17/20 0121 02/20/20 0933 02/22/20 0538  AST  --  25 18  ALT  --  66* 46*  ALKPHOS  --  54 58  BILITOT  --  0.8 0.8  PROT  --  5.3* 4.9*  ALBUMIN  --  2.6* 2.2*  INR 1.2  --   --      INFECTIOUS No results for input(s): LATICACIDVEN, PROCALCITON in the last 168 hours.   ENDOCRINE CBG (last 3)  Recent Labs    02/21/20 2316 02/22/20 0318 02/22/20 0738  GLUCAP 106* 128* 140*         IMAGING x48h  - image(s) personally visualized  -   highlighted in bold CT CHEST W CONTRAST  Result Date: 02/20/2020 CLINICAL DATA:  Assess for mediastinal mass. Known brain  malignancy post resection. EXAM: CT CHEST WITH CONTRAST TECHNIQUE: Multidetector CT imaging of the chest was performed during intravenous contrast administration. CONTRAST:  72mL OMNIPAQUE IOHEXOL 300 MG/ML  SOLN COMPARISON:  Radiograph 02/20/2020 FINDINGS: Cardiovascular: Cardiomegaly with predominantly left heart enlargement. No pericardial effusion. Marked dilatation of the ascending thoracic aorta to 4.7 cm remaining dilated at the aortic arch up to 3.6 cm returning to a more normal caliber of 3 cm by the diaphragmatic hiatus. No acute luminal abnormality is evident. No periaortic stranding or hemorrhage. Normal 3 vessel branching of the aortic arch with mild tortuosity of brachiocephalic vasculature but otherwise unremarkable appearance of the proximal great vessels. Central pulmonary arteries are normal caliber. No large central filling defects. Left upper extremity PICC tip terminates in the lower SVC. No other major venous abnormalities are seen. Mediastinum/Nodes: No focal mediastinal mass mediastinal fluid or gas. Normal thyroid gland and thoracic inlet. Endotracheal intubation with the tip of endotracheal to 4.8 cm from the carina. Transesophageal tube is in place as well terminating below the margins of imaging, with tip and side port beyond the GE junction. No acute abnormality of the trachea or esophagus. No worrisome mediastinal, hilar or axillary adenopathy. Lungs/Pleura: Bilateral pleural effusions with some adjacent areas passive atelectatic change. Additional areas of subsegmental atelectatic collapse are seen in the posterior segment right upper lobe and posterior segment right lower lobe. No pneumothorax. No concerning pulmonary nodules or masses are seen. Upper Abdomen: No acute abnormalities present in the visualized portions of the upper abdomen. Transesophageal tube in place. Musculoskeletal: Cervical spondylitic changes. Degenerative changes in the shoulders and thoracic spine are present  as well. No acute or worrisome osseous lesions. No suspicious chest wall lesions. IMPRESSION: 1. Marked dilatation of the ascending thoracic aorta to 4.7 cm remaining dilated at the distal aortic arch up to 3.6 cm returning to a more normal caliber of 3 cm by the diaphragmatic hiatus. No acute luminal abnormality is evident within the limitations of this non angiographic exam. Ascending thoracic aortic aneurysm. Recommend semi-annual imaging followup by CTA or MRA and referral to cardiothoracic surgery if not already obtained. This recommendation follows 2010 ACCF/AHA/AATS/ACR/ASA/SCA/SCAI/SIR/STS/SVM Guidelines for the Diagnosis and Management of Patients With Thoracic Aortic Disease. Circulation. 2010; 121: I627-O350. Aortic aneurysm NOS (ICD10-I71.9) 2. Cardiomegaly with predominantly left heart enlargement. 3. No discrete mediastinal mass or adenopathy. Contour abnormality in widening likely related to the aneurysm and cardiomegaly above. 4. Bilateral pleural effusions with some adjacent areas of passive atelectatic change. Additional areas of subsegmental atelectatic collapse are seen in the posterior segment right upper lobe and posterior segment right lower lobe. Underlying airspace disease is difficult to exclude fully. 5. Endotracheal intubation with the tip of endotracheal to 4.8 cm from the carina. 6. Transesophageal tube in place with tip and side port beyond the margins of imaging. Electronically Signed   By: Lovena Le M.D.   On: 02/20/2020 18:06   DG CHEST  PORT 1 VIEW  Result Date: 02/22/2020 CLINICAL DATA:  Respiratory failure. EXAM: PORTABLE CHEST 1 VIEW COMPARISON:  Chest x-ray 02/20/2020 FINDINGS: The endotracheal tube is 3.5 cm above the carina. The feeding tube is coursing down the esophagus and into the stomach. The left PICC line is stable. Streaky basilar atelectasis but no infiltrates, edema or large effusion. No pneumothorax. The bony thorax is intact. IMPRESSION: 1. Stable support  apparatus. 2. Streaky basilar atelectasis. Electronically Signed   By: Marijo Sanes M.D.   On: 02/22/2020 07:14   EEG LTVM - Continuous Bedside W/ Video Includes Portable EEG Read  Result Date: 02/20/2020 Lora Havens, MD     02/21/2020  9:29 AM Patient Name: Antonio Glenn MRN: [id number] Epilepsy Attending: Lora Havens Referring Physician/Provider: Dr Amie [place] Duration: 02/19/2020 1119 to 02/20/2020 1119  Patient history: 58 yo M with left parietal brain tumor, recent seizure who has improving but persistent non-focal delirium/encephalopathy. EEG to evaluate for seizure  Level of alertness: Awake, asleep  AEDs during EEG study: Propofol, versed, Phenobarb, Lacosamide  Technical aspects: This EEG study was done with scalp electrodes positioned according to the 10-20 International system of electrode placement. Electrical activity was acquired at a sampling rate of 500Hz  and reviewed with a high frequency filter of 70Hz  and a low frequency filter of 1Hz . EEG data were recorded continuously and digitally stored.  Description: No posterior dominant rhythm was seen. Sleep was characterized by vertex waves, sleep spindles (12 to 14 Hz), maximal frontocentral region. Sharp waves were noted in left temporal region.Continuous 3-5Hz  theta-delta slowing was also noted.Hyperventilation and photic stimulation were not performed.   ABNORMALITY -Sharp waves,  left temporal region -Continuous slow  IMPRESSION: This studyshowed evidence of epileptogenicity arising from left temporal region. There is also severe diffuse encephalopathy, non specific etiology but likely secondary to sedation. No seizures were seen throughout the recording.  Lora Havens   Critical care time: The patient is critically ill with multiple organ systems failure and requires high complexity decision making for assessment and support, frequent evaluation and titration of therapies, application of advanced monitoring  technologies and extensive interpretation of multiple databases.  Critical care time 37 mins. This represents my time independent of the NPs time taking care of the pt. This is excluding procedures.    [facility] 02/22/2020, 8:28 AM

## 2020-02-22 NOTE — Plan of Care (Signed)
  Problem: Clinical Measurements: Goal: Will remain free from infection Outcome: Progressing Goal: Respiratory complications will improve Outcome: Progressing Goal: Cardiovascular complication will be avoided Outcome: Progressing   

## 2020-02-22 NOTE — Progress Notes (Signed)
LTM EEG discontinued - no skin breakdown at unhook.   

## 2020-02-22 NOTE — Progress Notes (Deleted)
EEG complete - results pending 

## 2020-02-22 NOTE — Progress Notes (Signed)
Patient's daughter, [name], called requesting MD call with update. Notified neurosurgery, CCM, and neurology about request. Daughter called again later in evening, encouraged her to visit in AM to be present for MD rounding on patient. Also explained that MD will be asked again to call in the morning with an update for the patient.

## 2020-02-22 NOTE — Procedures (Addendum)
Patient Name:Antonio Glenn BIP:[id number] Epilepsy Attending:Kandace Elrod Barbra Sarks Referring Physician/Provider:DrAshish Rory Percy Duration:02/21/2020 1119 to 02/22/2020 1021  Patient history:59 yo M with left parietal brain tumor, recent seizure who has improving but persistent non-focal delirium/encephalopathy.EEG to evaluate for seizure  Level of alertness:Awake, asleep  AEDs during EEG study:Propofol, versed, Phenobarb, Lacosamide  Technical aspects: This EEG study was done with scalp electrodes positioned according to the 10-20 International system of electrode placement. Electrical activity was acquired at a sampling rate of 500Hz  and reviewed with a high frequency filter of 70Hz  and a low frequency filter of 1Hz . EEG data were recorded continuously and digitally stored.   Description:Noposterior dominant rhythmwas seen.Sleep was characterized by vertex waves, sleep spindles (12 to 14 Hz), maximal frontocentral region. Sharp waveswere noted in left temporal region.Continuous generalized 6-8Hz  theta-alpha as well as intermittent 2-3hz  delta slowing was also noted.Hyperventilation and photic stimulation were not performed.   ABNORMALITY -Sharp waves,left temporal region -Continuous slow  IMPRESSION: This studyshowed evidence of epileptogenicity arising from left temporalregion. There is also moderate to severe diffuse encephalopathy, non specific etiology but likely secondary to sedation.No seizures were seen throughout the recording.  EEG appears to be improving compared to yesterday.  Antonio Glenn Barbra Sarks

## 2020-02-22 NOTE — Progress Notes (Signed)
Subjective: He seems slightly more awake than previously described.  Exam: Vitals:   02/22/20 0735 02/22/20 0800  BP:    Pulse:    Resp:    Temp:  98.4 F (36.9 C)  SpO2: 98%    Gen: In bed, NAD Resp: non-labored breathing, no acute distress Abd: soft, nt  Neuro: MS: Eyes are open, he attempts to engage with examiner, he squeezes fingers on the left, unclear if to command or not.  He does not wiggle toes to command or move his right side to command. CN: Pupils are reactive bilaterally, corneals are intact.  He does not look to the side, when I call his name, he attempts to turn his head, but does not look to the left or right. 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  Slightly more awake today, will get PHB level tomorrow.   Recommendations: 1)  can discontinue EEG monitoring if no seizures seen again overnight.  2) continue PHB 64.8mg  qam, 97.2 mg qpm 3) continue vimapt 100mg  Q12H 4) continue dexamethasone   This patient is critically ill and at significant risk of neurological worsening, death and care requires constant monitoring of vital signs, hemodynamics,respiratory and cardiac monitoring, neurological assessment, discussion with family, other specialists and medical decision making of high complexity. I spent 35 minutes of neurocritical care time  in the care of  this patient. This was time spent independent of any time provided by nurse practitioner or PA.  Roland Rack, MD [facility] [phone]  If 7pm- 7am, please page neurology on call as listed in [place]. 02/22/2020  9:13 AM

## 2020-02-22 NOTE — Plan of Care (Signed)
°  Problem: Coping: °Goal: Level of anxiety will decrease °Outcome: Progressing °  °

## 2020-02-23 DIAGNOSIS — J9601 Acute respiratory failure with hypoxia: Secondary | ICD-10-CM | POA: Diagnosis not present

## 2020-02-23 DIAGNOSIS — R4182 Altered mental status, unspecified: Secondary | ICD-10-CM | POA: Diagnosis not present

## 2020-02-23 LAB — CBC WITH DIFFERENTIAL/PLATELET
Abs Immature Granulocytes: 0.9 10*3/uL — ABNORMAL HIGH (ref 0.00–0.07)
Basophils Absolute: 0 10*3/uL (ref 0.0–0.1)
Basophils Relative: 0 %
Eosinophils Absolute: 0.1 10*3/uL (ref 0.0–0.5)
Eosinophils Relative: 1 %
HCT: 36.9 % — ABNORMAL LOW (ref 39.0–52.0)
Hemoglobin: 11.9 g/dL — ABNORMAL LOW (ref 13.0–17.0)
Immature Granulocytes: 10 %
Lymphocytes Relative: 16 %
Lymphs Abs: 1.5 10*3/uL (ref 0.7–4.0)
MCH: 30.4 pg (ref 26.0–34.0)
MCHC: 32.2 g/dL (ref 30.0–36.0)
MCV: 94.4 fL (ref 80.0–100.0)
Monocytes Absolute: 0.9 10*3/uL (ref 0.1–1.0)
Monocytes Relative: 9 %
Neutro Abs: 6.1 10*3/uL (ref 1.7–7.7)
Neutrophils Relative %: 64 %
Platelets: 295 10*3/uL (ref 150–400)
RBC: 3.91 MIL/uL — ABNORMAL LOW (ref 4.22–5.81)
RDW: 13.8 % (ref 11.5–15.5)
WBC: 9.5 10*3/uL (ref 4.0–10.5)
nRBC: 0 % (ref 0.0–0.2)

## 2020-02-23 LAB — COMPREHENSIVE METABOLIC PANEL
ALT: 45 U/L — ABNORMAL HIGH (ref 0–44)
AST: 25 U/L (ref 15–41)
Albumin: 2.3 g/dL — ABNORMAL LOW (ref 3.5–5.0)
Alkaline Phosphatase: 66 U/L (ref 38–126)
Anion gap: 10 (ref 5–15)
BUN: 16 mg/dL (ref 6–20)
CO2: 23 mmol/L (ref 22–32)
Calcium: 8.3 mg/dL — ABNORMAL LOW (ref 8.9–10.3)
Chloride: 106 mmol/L (ref 98–111)
Creatinine, Ser: 0.58 mg/dL — ABNORMAL LOW (ref 0.61–1.24)
GFR, Estimated: >60 mL/min (ref >60)
Glucose, Bld: 134 mg/dL — ABNORMAL HIGH (ref 70–99)
Potassium: 4.4 mmol/L (ref 3.5–5.1)
Sodium: 139 mmol/L (ref 135–145)
Total Bilirubin: 0.4 mg/dL (ref 0.3–1.2)
Total Protein: 5 g/dL — ABNORMAL LOW (ref 6.5–8.1)

## 2020-02-23 LAB — GLUCOSE, CAPILLARY
Glucose-Capillary: 111 mg/dL — ABNORMAL HIGH (ref 70–99)
Glucose-Capillary: 122 mg/dL — ABNORMAL HIGH (ref 70–99)
Glucose-Capillary: 92 mg/dL (ref 70–99)
Glucose-Capillary: 94 mg/dL (ref 70–99)
Glucose-Capillary: 115 mg/dL — ABNORMAL HIGH (ref 70–99)
Glucose-Capillary: 126 mg/dL — ABNORMAL HIGH (ref 70–99)

## 2020-02-23 LAB — PHENOBARBITAL LEVEL: Phenobarbital: 9.6 ug/mL — ABNORMAL LOW (ref 15.0–30.0)

## 2020-02-23 LAB — TRIGLYCERIDES: Triglycerides: 158 mg/dL — ABNORMAL HIGH (ref <150)

## 2020-02-23 LAB — PHOSPHORUS: Phosphorus: 4 mg/dL (ref 2.5–4.6)

## 2020-02-23 MED ORDER — POLYETHYLENE GLYCOL 3350 17 G PO PACK
17.0000 g | PACK | Freq: Every day | ORAL | Status: DC | PRN
  Administered 2020-02-27 – 2020-02-29 (×4): 17 g
  Filled 2020-02-23 (×5): qty 1

## 2020-02-23 MED ORDER — BISACODYL 10 MG RE SUPP
10.0000 mg | Freq: Every day | RECTAL | Status: DC | PRN
  Administered 2020-02-23: 10 mg via RECTAL
  Filled 2020-02-23: qty 1

## 2020-02-23 MED ORDER — LISINOPRIL 20 MG PO TABS
20.0000 mg | ORAL_TABLET | Freq: Every day | ORAL | Status: DC
  Administered 2020-02-23: 20 mg via ORAL
  Filled 2020-02-23 (×2): qty 1

## 2020-02-23 MED ORDER — LABETALOL HCL 5 MG/ML IV SOLN
10.0000 mg | INTRAVENOUS | Status: DC | PRN
  Administered 2020-02-23 – 2020-03-03 (×26): 10 mg via INTRAVENOUS
  Filled 2020-02-23 (×20): qty 4

## 2020-02-23 NOTE — Progress Notes (Signed)
   02/23/20 0742  Vitals  Pulse Rate 71  ECG Heart Rate 71  Resp 18  Oxygen Therapy  SpO2 95 %  Art Line  Arterial Line BP 79/59  Arterial Line MAP (mmHg) 68 mmHg  MEWS Score  MEWS Temp 0  MEWS Systolic 2  MEWS Pulse 0  MEWS RR 0  MEWS LOC 1  MEWS Score 3  MEWS Score Color Yellow  Provider Notification  Provider Name/Title Dr Gwen Her  Date Provider Notified 02/23/20  Time Provider Notified 0740  Notification Type Call  Notification Reason Other (Comment);Change in status;Requested by patient/family (high UO, low spec gravity)  Response See new orders  Date of Provider Response 02/23/20  Time of Provider Response [phone]

## 2020-02-23 NOTE — Plan of Care (Signed)
  Problem: Clinical Measurements: Goal: Ability to maintain clinical measurements within normal limits will improve Outcome: Progressing Goal: Will remain free from infection Outcome: Progressing Goal: Diagnostic test results will improve Outcome: Progressing Goal: Respiratory complications will improve Outcome: Progressing Goal: Cardiovascular complication will be avoided Outcome: Progressing   Problem: Elimination: Goal: Will not experience complications related to bowel motility Outcome: Progressing

## 2020-02-23 NOTE — Progress Notes (Signed)
Subjective: Needing more sedation this morning  Exam: Vitals:   02/23/20 1522 02/23/20 1600  BP: (!) 187/87 (!) 206/95  Pulse: 88 85  Resp: 17 19  Temp:    SpO2: 97% 98%   Gen: In bed, NAD Resp: non-labored breathing, no acute distress Abd: soft, nt  Neuro: MS: Eyes are open, he attempts to engage with examiner, he squeezes fingers on the left, unclear if to command or not.  He does not wiggle toes to command or move his right side to command. CN: Pupils are reactive bilaterally, corneals are intact.  He does not look to the side, when I call his name, he attempts to turn his head, but does not look to the left or right. 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  PHB still slightly low, but will continue current dose as unlikely at steady state and c-eeg was negative for seizure.   Recommendations: 1) continue PHB 64.8mg  qam, 97.2 mg qpm 2) continue vimpat 100mg  Q12H 3) continue dexamethasone    Roland Rack, MD [facility] [phone]  If 7pm- 7am, please page neurology on call as listed in [place]. 02/23/2020  4:46 PM

## 2020-02-23 NOTE — Progress Notes (Signed)
NAME:  Antonio Glenn, MRN:  [id number], DOB:  Feb 23, 1961, LOS: 12 ADMISSION DATE:  02/11/2020, CONSULTATION DATE:  02/11/20 REFERRING MD:  Viona Gilmore  CHIEF COMPLAINT:  AMS / Seizures   Brief History   Antonio Glenn is a 59 y.o. male with hx of recently diagnosed brain tumor who was transferred from [place] ED to [facility] 11/25 with AMS and seizures.  He is scheduled for left craniotomy on Dec 1 with Dr. Marcello Moores.  Past Medical History  Brain Mass.  Significant Hospital Events   11/25 > admit.  12/1 -  CRANIOTOMY OF  LEFT PARIETAL TUMOR   12./3 - NSGY recommending c-EEG given "subtle left parahippocampal/hippocampal T2 hyperintensity suggestive of recent seizures (this signal was not apparent 3 weeks ago, so new tumor infiltration in this area would be less likely)"> they are reducing steroids. On fent gtt/versed gtt -> and still restless moving all 4s. Opens eyes to voice but does not follow commands. RN asking about starting TF and removing foley. Some days ago had hematuria after self dc of foley. RN planning for enema. RD here for cortrak  12/4 -  ongoing eeg. On vent. On fent gtt, versed gtt and diprivan gtt. Restless when diprivan lowered to < 20 and hypotensive when diprivan increased to > 38mg/min though sedated. CT chest pending. NO issues after foley removal  Consults:  Neurosurgery neuro  Procedures:  12/1: crainiotomy  Significant Diagnostic Tests:  CT head 11/25 > 6 - 7cm mass in left parietal lobe with areas of internal cystic necrosis.  Mild regional mass effect with no MLS.     MRI brain 12/2: Expected postoperative changes post gross total resection of enhancing component of left parietotemporal mass. Minimal enhancement at the resection cavity margins is probably postoperative but small volume residual tumor is difficult to exclude due to irregular appearance of the preoperative mass.  T2 hyperintensity along the left hippocampus and posteromedial left thalamus. May  be related to recent seizure activity, postsurgical, or rapid progression of infiltrating tumor.  CT Chest 12/4 - IMPRESSION: 1. Marked dilatation of the ascending thoracic aorta to 4.7 cm remaining dilated at the distal aortic arch up to 3.6 cm returning to a more normal caliber of 3 cm by the diaphragmatic hiatus. No acute luminal abnormality is evident within the limitations of this non angiographic exam. Ascending thoracic aortic aneurysm. Recommend semi-annual imaging followup by CTA or MRA and referral to cardiothoracic surgery if not already obtained. This recommendation follows 2010 ACCF/AHA/AATS/ACR/ASA/SCA/SCAI/SIR/STS/SVM Guidelines for the Diagnosis and Management of Patients With Thoracic Aortic Disease. Circulation. 2010; 121:: T660-A004 Aortic aneurysm NOS (ICD10-I71.9) 2. Cardiomegaly with predominantly left heart enlargement. 3. No discrete mediastinal mass or adenopathy. Contour abnormality in widening likely related to the aneurysm and cardiomegaly above. 4. Bilateral pleural effusions with some adjacent areas of passive atelectatic change. Additional areas of subsegmental atelectatic collapse are seen in the posterior segment right upper lobe and posterior segment right lower lobe. Underlying airspace disease is difficult to exclude fully. 5. Endotracheal intubation with the tip of endotracheal to 4.8 cm from the carina. 6. Transesophageal tube in place with tip and side port beyond the margins of imaging.    Micro Data:  Flu 11/25 > neg COVID 11/25 > neg Urine cx  12/4: neg  Antimicrobials:   cefazolin 12/1 - 12/2 vanc 11/29 - 12/2   Interim history/subjective:  12/7: will attempt sbt today despite sedation and hopefully plan for extubation soon. Still not following commands. Will  d/w family prior to extubation. Airway noted to be mallampati II, grade I view with mac 4. htn this am with sbp >190, starting PO agent as unable to wean sedation at this  time with already this degree of hypertension. Too sedate this am for sbt  12/6: increased decadron yesterday. Continues with sharp waves in L temporal region on cEEG but no frank sz. Ammonia normal. Not following commands this am. Kicking legs oob and feet pulling on foley. 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 c-EEG ongoing - > This studyshowed evidence of epileptogenicity arising from left temporalregion. There is also severe diffuse encephalopathy, non specific etiology but likely secondary to sedation. On vent. Gets agitated easy. On Diprivan gtt, versed gtt, fentgtt. 40% fio2 on vent   Objective:  Blood pressure (!) 118/56, pulse 68, temperature 98.6 F (37 C), temperature source Axillary, resp. rate 18, height _0  (1.905 m), weight (!) 146.9 kg, SpO2 100 %.    Vent Mode: PRVC FiO2 (%):  [30 %] 30 % Set Rate:  [18 bmp] 18 bmp Vt Set:  [670 mL] 670 mL PEEP:  [5 cmH20] 5 cmH20 Plateau Pressure:  [17 cmH20-19 cmH20] 19 cmH20   Intake/Output Summary (Last 24 hours) at 02/23/2020 5003 Last data filed at 02/23/2020 0800 Gross per 24 hour  Intake 3056.74 ml  Output 2880 ml  Net 176.74 ml   Filed Weights   02/18/20 0500 02/19/20 0400 02/22/20 0500  Weight: (!) 138.3 kg (!) 140.3 kg (!) 146.9 kg     General Appearance:  Looks critically ill, restless supine in bed, OBESE - + Head:  Normocephalic, without obvious abnormality, atraumatic Eyes:  PERRL, conjunctiva injected   Ears:  Normal external ear canals, both ears Throat: ett in place Neck:  Supple,  No enlargement/tenderness/nodules Lungs: Clear to auscultation bilaterally, Heart:  S1 and S2 normal, no murmur Abdomen:  Obese, Soft, no masses, no organomegaly Extremities:  Extremities 5/5 in all 4, no c/c/e Skin:  intact in exposed areas . Sacral area - not examined Neurologic: not following commands. Moving all 4 extremities with great strength though.            Assessment & Plan:  Acute Resp failure due to brain mass and acute encephalopathy Plan PRVC VAP bundle -preferrably would get pt to where he can follow commands but not likely a possibility will d/w daughter concerns about extubation without this part of typical sbt.     Acute encephalopathy/hyperactive delirium due to left parietal tumor, requiring titration of IV sedative infusions -ammonia negative -LTM stopped 12/6  Seizure episode 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 Plan - fent/versed/propofol, agitation when weaned -aed per neuro -LTM d/c'd 12/6 - dexamethasone per neurosx - contnue seroquel.. periodic measuring of qt interval  -Continue lorazepam as needed for seizure activity - RASS goal 0 to -2  PAF - A Fib hx Hypertension: plan -Holding full dose anticoagulation due to recent brain surgery - timing restart per NSGY (on chemoprophy however) -start lisinopril    Thoracic Aorta Aneurysm - new 02/20/20 Plan  - repeat CT chest in 6 months  Normocytic anemia Plan  -no acute signs of bleeding  Recurrent Hematuria when foley dc'ed - last noted 02/20/20 - 2nd episode. Suspect traumatic Plan  - keep foley in and monitor   Daily Goals Checklist  Pain/Anxiety/Delirium protocol (if indicated): see mar VAP protocol (if indicated): yes Blood pressure target: Keep systolic blood pressure less  than 170 DVT prophylaxis: lovenox Nutrition Status: TF  GI prophylaxis protonix Urinary catheter: foley in place Central lines: None Glucose control: ssi Mobility/therapy needs: Bedrest, continues to require restraints for patient safety Code Status: Full Family Communication: pending Disposition: ICU      LABS    PULMONARY Recent Labs  Lab 02/17/20 1533 02/17/20 1703  PHART 7.344* 7.371  PCO2ART 45.9 43.6  PO2ART 72* 275*  HCO3 25.0 25.3  TCO2 26 27  O2SAT 93.0 100.0    CBC Recent Labs  Lab 02/21/20 0540 02/22/20 0538  02/23/20 0349  HGB 11.8* 11.6* 11.9*  HCT 35.6* 35.3* 36.9*  WBC 8.9 7.9 9.5  PLT 255 270 295    COAGULATION Recent Labs  Lab 02/17/20 0121  INR 1.2    CARDIAC  No results for input(s): TROPONINI in the last 168 hours. No results for input(s): PROBNP in the last 168 hours.   CHEMISTRY Recent Labs  Lab 02/16/20 1643 02/16/20 1643 02/17/20 0530 02/17/20 1533 02/17/20 1703 02/17/20 1703 02/18/20 0520 02/18/20 0520 02/20/20 0933 02/20/20 0933 02/21/20 0540 02/22/20 0538 02/23/20 0349 02/23/20 0749  NA  --   --  141   < > 137  --  140  --  140  --   --  138  --  139  K  --   --  3.9   < > 3.5   < > 3.4*   < > 4.1   < >  --  4.0  --  4.4  CL  --   --  109  --   --   --  106  --  107  --   --  106  --  106  CO2  --   --  24  --   --   --  21*  --  25  --   --  25  --  23  GLUCOSE  --   --  145*  --   --   --  132*  --  120*  --   --  133*  --  134*  BUN  --   --  19  --   --   --  24*  --  25*  --   --  20  --  16  CREATININE  --   --  0.67  --   --   --  0.97  --  0.60*  --   --  0.54*  --  0.58*  CALCIUM  --   --  7.1*  --   --   --  8.0*  --  8.1*  --   --  8.2*  --  8.3*  MG 2.3  --  2.1  --   --   --   --   --  2.2  --   --  2.0  --   --   PHOS 2.7   < > 3.0  --   --   --   --   --  2.9  --  2.5 3.2 4.0  --    < > = values in this interval not displayed.   Estimated Creatinine Clearance: 152.1 mL/min (A) (by C-G formula based on SCr of 0.58 mg/dL (L)).   LIVER Recent Labs  Lab 02/17/20 0121 02/20/20 0933 02/22/20 0538 02/23/20 0749  AST  --  _0 ALT  --  66* 46* 45*  ALKPHOS  --  54 58  66  BILITOT  --  0.8 0.8 0.4  PROT  --  5.3* 4.9* 5.0*  ALBUMIN  --  2.6* 2.2* 2.3*  INR 1.2  --   --   --      INFECTIOUS No results for input(s): LATICACIDVEN, PROCALCITON in the last 168 hours.   ENDOCRINE CBG (last 3)  Recent Labs    02/22/20 2306 02/23/20 0318 02/23/20 0732  GLUCAP 129* 111* 122*         IMAGING x48h  - image(s) personally  visualized  -   highlighted in bold DG CHEST PORT 1 VIEW  Result Date: 02/22/2020 CLINICAL DATA:  Respiratory failure. EXAM: PORTABLE CHEST 1 VIEW COMPARISON:  Chest x-ray 02/20/2020 FINDINGS: The endotracheal tube is 3.5 cm above the carina. The feeding tube is coursing down the esophagus and into the stomach. The left PICC line is stable. Streaky basilar atelectasis but no infiltrates, edema or large effusion. No pneumothorax. The bony thorax is intact. IMPRESSION: 1. Stable support apparatus. 2. Streaky basilar atelectasis. Electronically Signed   By: Marijo Sanes M.D.   On: 02/22/2020 07:14   Critical care time: The patient is critically ill with multiple organ systems failure and requires high complexity decision making for assessment and support, frequent evaluation and titration of therapies, application of advanced monitoring technologies and extensive interpretation of multiple databases.  Critical care time 42 mins. This represents my time independent of the NPs time taking care of the pt. This is excluding procedures.    [facility] 02/23/2020, 9:09 AM

## 2020-02-23 NOTE — Progress Notes (Signed)
Subjective: NAEs o/n.  Failed SBT due to agitation.  Objective: Vital signs in last 24 hours: Temp:  [97.9 F (36.6 C)-98.6 F (37 C)] 98.6 F (37 C) (12/07 0800) Pulse Rate:  [54-90] 86 (12/07 1000) Resp:  [0-22] 14 (12/07 1000) BP: (109-228)/(56-146) 168/85 (12/07 1042) SpO2:  [95 %-100 %] 98 % (12/07 1000) Arterial Line BP: (66-213)/(51-163) 179/75 (12/07 1000) FiO2 (%):  [30 %] 30 % (12/07 0827)  Intake/Output from previous day: 12/06 0701 - 12/07 0700 In: 3045.7 [I.V.:1630.5; NG/GT:1320; IV Piggyback:95.3] Out: 2725 [Urine:2725] Intake/Output this shift: Total I/O In: 271.3 [I.V.:216.3; NG/GT:55] Out: 855 [Urine:855]  Incision c/d Intubated, sedated. Eyes open to voice, PERRL. Purposeful in all extremities.  Lab Results: Recent Labs    02/22/20 0538 02/23/20 0349  WBC 7.9 9.5  HGB 11.6* 11.9*  HCT 35.3* 36.9*  PLT 270 295   BMET Recent Labs    02/22/20 0538 02/23/20 0749  NA 138 139  K 4.0 4.4  CL 106 106  CO2 25 23  GLUCOSE 133* 134*  BUN 20 16  CREATININE 0.54* 0.58*  CALCIUM 8.2* 8.3*    Studies/Results: DG CHEST PORT 1 VIEW  Result Date: 02/22/2020 CLINICAL DATA:  Respiratory failure. EXAM: PORTABLE CHEST 1 VIEW COMPARISON:  Chest x-ray 02/20/2020 FINDINGS: The endotracheal tube is 3.5 cm above the carina. The feeding tube is coursing down the esophagus and into the stomach. The left PICC line is stable. Streaky basilar atelectasis but no infiltrates, edema or large effusion. No pneumothorax. The bony thorax is intact. IMPRESSION: 1. Stable support apparatus. 2. Streaky basilar atelectasis. Electronically Signed   By: Marijo Sanes M.D.   On: 02/22/2020 07:14    Assessment/Plan: 59 yo M with L parietal mass s/p resection 12/1, preoperative encephalopathy has persisted.  He has had some sharp waves in the left temporal lobe but no seizures. - his severe agitation has made sedation weaning very difficult.  He will likely require sedation holiday  with extubation to see what his neurologic status is. - I discussed the plan of care with the daughter via for 15 minutes.   [name] 02/23/2020, 11:33 AM

## 2020-02-24 DIAGNOSIS — Z978 Presence of other specified devices: Secondary | ICD-10-CM | POA: Diagnosis not present

## 2020-02-24 DIAGNOSIS — R4182 Altered mental status, unspecified: Secondary | ICD-10-CM | POA: Diagnosis not present

## 2020-02-24 DIAGNOSIS — G9389 Other specified disorders of brain: Secondary | ICD-10-CM | POA: Diagnosis not present

## 2020-02-24 LAB — CBC WITH DIFFERENTIAL/PLATELET
Abs Immature Granulocytes: 0.72 10*3/uL — ABNORMAL HIGH (ref 0.00–0.07)
Basophils Absolute: 0 10*3/uL (ref 0.0–0.1)
Basophils Relative: 0 %
Eosinophils Absolute: 0.2 10*3/uL (ref 0.0–0.5)
Eosinophils Relative: 1 %
HCT: 36.9 % — ABNORMAL LOW (ref 39.0–52.0)
Hemoglobin: 12.1 g/dL — ABNORMAL LOW (ref 13.0–17.0)
Immature Granulocytes: 6 %
Lymphocytes Relative: 9 %
Lymphs Abs: 1.1 10*3/uL (ref 0.7–4.0)
MCH: 30.9 pg (ref 26.0–34.0)
MCHC: 32.8 g/dL (ref 30.0–36.0)
MCV: 94.4 fL (ref 80.0–100.0)
Monocytes Absolute: 1.2 10*3/uL — ABNORMAL HIGH (ref 0.1–1.0)
Monocytes Relative: 10 %
Neutro Abs: 9.7 10*3/uL — ABNORMAL HIGH (ref 1.7–7.7)
Neutrophils Relative %: 74 %
Platelets: 309 10*3/uL (ref 150–400)
RBC: 3.91 MIL/uL — ABNORMAL LOW (ref 4.22–5.81)
RDW: 14.2 % (ref 11.5–15.5)
WBC: 12.9 10*3/uL — ABNORMAL HIGH (ref 4.0–10.5)
nRBC: 0 % (ref 0.0–0.2)

## 2020-02-24 LAB — GLUCOSE, CAPILLARY
Glucose-Capillary: 117 mg/dL — ABNORMAL HIGH (ref 70–99)
Glucose-Capillary: 131 mg/dL — ABNORMAL HIGH (ref 70–99)
Glucose-Capillary: 113 mg/dL — ABNORMAL HIGH (ref 70–99)
Glucose-Capillary: 121 mg/dL — ABNORMAL HIGH (ref 70–99)
Glucose-Capillary: 112 mg/dL — ABNORMAL HIGH (ref 70–99)
Glucose-Capillary: 98 mg/dL (ref 70–99)

## 2020-02-24 LAB — PHOSPHORUS: Phosphorus: 4.5 mg/dL (ref 2.5–4.6)

## 2020-02-24 MED ORDER — LISINOPRIL 20 MG PO TABS
20.0000 mg | ORAL_TABLET | Freq: Every day | ORAL | Status: DC
  Administered 2020-02-24 – 2020-02-26 (×3): 20 mg
  Filled 2020-02-24 (×2): qty 1

## 2020-02-24 MED ORDER — QUETIAPINE FUMARATE 100 MG PO TABS
100.0000 mg | ORAL_TABLET | Freq: Three times a day (TID) | ORAL | Status: DC
  Administered 2020-02-24 – 2020-02-26 (×6): 100 mg
  Filled 2020-02-24 (×6): qty 1

## 2020-02-24 MED ORDER — DIAZEPAM 5 MG PO TABS
5.0000 mg | ORAL_TABLET | Freq: Three times a day (TID) | ORAL | Status: DC
  Administered 2020-02-24 – 2020-03-03 (×24): 5 mg
  Filled 2020-02-24 (×24): qty 1

## 2020-02-24 MED ORDER — DEXAMETHASONE SODIUM PHOSPHATE 4 MG/ML IJ SOLN
2.0000 mg | Freq: Two times a day (BID) | INTRAMUSCULAR | Status: DC
  Administered 2020-02-24 – 2020-02-28 (×9): 2 mg via INTRAVENOUS
  Filled 2020-02-24 (×9): qty 1

## 2020-02-24 MED ORDER — CLONIDINE HCL 0.2 MG PO TABS
0.2000 mg | ORAL_TABLET | Freq: Two times a day (BID) | ORAL | Status: DC
  Administered 2020-02-24 (×2): 0.2 mg
  Filled 2020-02-24 (×3): qty 1

## 2020-02-24 MED ORDER — OXYCODONE HCL 5 MG PO TABS
5.0000 mg | ORAL_TABLET | Freq: Four times a day (QID) | ORAL | Status: DC
  Administered 2020-02-24 – 2020-02-26 (×8): 5 mg
  Filled 2020-02-24 (×8): qty 1

## 2020-02-24 NOTE — Progress Notes (Signed)
Nutrition Follow-up  DOCUMENTATION CODES:   Obesity unspecified  INTERVENTION:   Tube feeds via Cortrak: - Osmolite 1.5 @ 55 ml/hr (1320 ml/day) - ProSource TF 90 ml TID  Tube feeding regimen provides 2220 kcal, 148 grams of protein, and 1003 ml of H2O.    NUTRITION DIAGNOSIS:   Inadequate oral intake related to lethargy/confusion as evidenced by NPO status.  Ongoing.   GOAL:   Patient will meet greater than or equal to 90% of their needs  Met with TF.   MONITOR:   Diet advancement, Labs, Weight trends, TF tolerance, I & O's  REASON FOR ASSESSMENT:   Consult Enteral/tube feeding initiation and management  ASSESSMENT:   59 year old male who presented on 11/25 with seizure-like activity. Pt was recently found to have brain mass concerning for neoplasm with plan for left craniotomy on 12/01.  Pt discussed during ICU rounds and with RN.  Per MD plan to wean IV meds while starting oral medications, attempting SBT and discussing with family GOC.   11/29 cortrak placed; later pt pulled tube out 12/1 s/p crani of L parietal tumor  12/3 cortrak placed; tip gastric  Patient is currently intubated on ventilator support MV: 12.1 L/min Temp (24hrs), Avg:99 F (37.2 C), Min:98.4 F (36.9 C), Max:99.6 F (37.6 C)  Propofol @ 33 ml/hr (40 mcg) provides 871 kcal  Medications reviewed and include: decadron, colace, SSI, phenobarbital  Fentanyl Versed  Labs reviewed: CBG's: 131-113-121   Diet Order:   Diet Order    None      EDUCATION NEEDS:   No education needs have been identified at this time  Skin:  Skin Assessment: Reviewed RN Assessment  Last BM:  12/7  Height:   Ht Readings from Last 1 Encounters:  02/17/20 6' 3"  (1.905 m)    Weight:   Wt Readings from Last 1 Encounters:  02/24/20 (!) 142.6 kg    Ideal Body Weight:     BMI:  Body mass index is 39.29 kg/m.  Estimated Nutritional Needs:   Kcal:  2250-2450  Protein:  120-145  grams  Fluid:  >/= 2.0 L  Sanford Lindblad P., RD, LDN, CNSC See AMiON for contact information

## 2020-02-24 NOTE — Progress Notes (Signed)
Subjective: NAEs.  Patient failed SBT.  Objective: Vital signs in last 24 hours: Temp:  [98.4 F (36.9 C)-99.6 F (37.6 C)] 98.9 F (37.2 C) (12/08 1200) Pulse Rate:  [66-105] 74 (12/08 1300) Resp:  [12-25] 16 (12/08 1300) BP: (76-225)/(44-123) 152/81 (12/08 1300) SpO2:  [94 %-100 %] 98 % (12/08 1300) Arterial Line BP: (51-188)/(40-119) 104/48 (12/08 1200) FiO2 (%):  [30 %] 30 % (12/08 1143) Weight:  [142.6 kg] 142.6 kg (12/08 0246)  Intake/Output from previous day: 12/07 0701 - 12/08 0700 In: 3000.8 [I.V.:1925.7; NG/GT:990; IV Piggyback:85.1] Out: 5361 [Urine:4535] Intake/Output this shift: Total I/O In: 375.4 [I.V.:340.3; IV Piggyback:35] Out: -   Intubated, on Fentanyl, propofol, Versed Eyes half open to voice, turns head to voice. Restrained but appears to move all extremities Incision c/d  Lab Results: Recent Labs    02/23/20 0349 02/24/20 0415  WBC 9.5 12.9*  HGB 11.9* 12.1*  HCT 36.9* 36.9*  PLT 295 309   BMET Recent Labs    02/22/20 0538 02/23/20 0749  NA 138 139  K 4.0 4.4  CL 106 106  CO2 25 23  GLUCOSE 133* 134*  BUN 20 16  CREATININE 0.54* 0.58*  CALCIUM 8.2* 8.3*    Studies/Results: No results found.  Assessment/Plan: 60 yo M with left parietal brain tumor and recent seizures and encephalopathy, now s/p surgical resection 7 days ago - CCM team titrating oral meds in preparation for possible extubation trial - I have decreased his dex to 2 mg q [place] 02/24/2020, 1:37 PM

## 2020-02-24 NOTE — Progress Notes (Signed)
Subjective: No acute events overnight.  ROS: Unable to obtain due to poor mental status  Examination  Vital signs in last 24 hours: Temp:  [98.4 F (36.9 C)-99.6 F (37.6 C)] 98.9 F (37.2 C) (12/08 1200) Pulse Rate:  [66-105] 74 (12/08 1300) Resp:  [12-25] 16 (12/08 1300) BP: (76-225)/(44-123) 152/81 (12/08 1300) SpO2:  [94 %-100 %] 98 % (12/08 1300) Arterial Line BP: (51-188)/(40-119) 104/48 (12/08 1200) FiO2 (%):  [30 %] 30 % (12/08 1143) Weight:  [142.6 kg] 142.6 kg (12/08 0246)  General: lying in bed, not in apparent distress CVS: pulse-normal rate and rhythm RS: breathing comfortably, intubated Extremities: normal, warm  Neuro: Barely opens eyes to noxious stimuli, does not follow commands, withdraws to noxious in all 4 extremities  Basic Metabolic Panel: Recent Labs  Lab 02/17/20 1703 02/18/20 0520 02/18/20 0520 02/20/20 0933 02/21/20 0540 02/22/20 0538 02/23/20 0349 02/23/20 0749 02/24/20 0415  NA 137 140  --  140  --  138  --  139  --   K 3.5 3.4*  --  4.1  --  4.0  --  4.4  --   CL  --  106  --  107  --  106  --  106  --   CO2  --  21*  --  25  --  25  --  23  --   GLUCOSE  --  132*  --  120*  --  133*  --  134*  --   BUN  --  24*  --  25*  --  20  --  16  --   CREATININE  --  0.97  --  0.60*  --  0.54*  --  0.58*  --   CALCIUM  --  8.0*   < > 8.1*  --  8.2*  --  8.3*  --   MG  --   --   --  2.2  --  2.0  --   --   --   PHOS  --   --   --  2.9 2.5 3.2 4.0  --  4.5   < > = values in this interval not displayed.    CBC: Recent Labs  Lab 02/20/20 0933 02/21/20 0540 02/22/20 0538 02/23/20 0349 02/24/20 0415  WBC 9.5 8.9 7.9 9.5 12.9*  NEUTROABS 7.5 6.2 5.1 6.1 9.7*  HGB 13.3 11.8* 11.6* 11.9* 12.1*  HCT 40.1 35.6* 35.3* 36.9* 36.9*  MCV 92.8 93.7 94.1 94.4 94.4  PLT 242 255 270 295 309     Coagulation Studies: No results for input(s): LABPROT, INR in the last 72 hours.  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   Recommendations: - Continue lacosamide 100 mg twice daily, Phenobarb 60mg  QAM and 90mg  QHS -Patient also started on Valium by ICU team to help with agitation and wean off IV sedation -Neurosurgery decrease dexamethasone to 2 mg every 12 hours -We will consider increasing phenobarbital in in a couple of days rather than making any more changes changes today if patient continues to be agitated. -Continue seizure precautions -Management of rest of comorbidities per primary team  I have  spent a total of 25 minuteswith the patient reviewing hospitalnotes,  test results, labs and examining the patient as well as establishing an assessment and plan.>50% of time was spent in direct patient care.    Zeb Comfort [facility] For questions after 5pm please refer to AMION to reach the Neurologist on call

## 2020-02-24 NOTE — Progress Notes (Signed)
NAME:  Antonio Glenn, MRN:  [id number], DOB:  1960/06/20, LOS: 11 ADMISSION DATE:  02/11/2020, CONSULTATION DATE:  02/11/20 REFERRING MD:  Viona Gilmore  CHIEF COMPLAINT:  AMS / Seizures   Brief History   Antonio Glenn is a 59 y.o. male with hx of recently diagnosed brain tumor who was transferred from [place] ED to [facility] 11/25 with AMS and seizures.  He is scheduled for left craniotomy on Dec 1 with Dr. Marcello Moores.  Past Medical History  Brain Mass.  Significant Hospital Events   11/25 > admit.  12/1 -  CRANIOTOMY OF  LEFT PARIETAL TUMOR   12./3 - NSGY recommending c-EEG given "subtle left parahippocampal/hippocampal T2 hyperintensity suggestive of recent seizures (this signal was not apparent 3 weeks ago, so new tumor infiltration in this area would be less likely)"> they are reducing steroids. On fent gtt/versed gtt -> and still restless moving all 4s. Opens eyes to voice but does not follow commands. RN asking about starting TF and removing foley. Some days ago had hematuria after self dc of foley. RN planning for enema. RD here for cortrak  12/4 -  ongoing eeg. On vent. On fent gtt, versed gtt and diprivan gtt. Restless when diprivan lowered to < 20 and hypotensive when diprivan increased to > 87mg/min though sedated. CT chest pending. NO issues after foley removal  Consults:  Neurosurgery neuro  Procedures:  12/1: crainiotomy  Significant Diagnostic Tests:  CT head 11/25 > 6 - 7cm mass in left parietal lobe with areas of internal cystic necrosis.  Mild regional mass effect with no MLS.     MRI brain 12/2: Expected postoperative changes post gross total resection of enhancing component of left parietotemporal mass. Minimal enhancement at the resection cavity margins is probably postoperative but small volume residual tumor is difficult to exclude due to irregular appearance of the preoperative mass.  T2 hyperintensity along the left hippocampus and posteromedial left thalamus. May  be related to recent seizure activity, postsurgical, or rapid progression of infiltrating tumor.  CT Chest 12/4 - IMPRESSION: 1. Marked dilatation of the ascending thoracic aorta to 4.7 cm remaining dilated at the distal aortic arch up to 3.6 cm returning to a more normal caliber of 3 cm by the diaphragmatic hiatus. No acute luminal abnormality is evident within the limitations of this non angiographic exam. Ascending thoracic aortic aneurysm. Recommend semi-annual imaging followup by CTA or MRA and referral to cardiothoracic surgery if not already obtained. This recommendation follows 2010 ACCF/AHA/AATS/ACR/ASA/SCA/SCAI/SIR/STS/SVM Guidelines for the Diagnosis and Management of Patients With Thoracic Aortic Disease. Circulation. 2010; 121:: S827-M786 Aortic aneurysm NOS (ICD10-I71.9) 2. Cardiomegaly with predominantly left heart enlargement. 3. No discrete mediastinal mass or adenopathy. Contour abnormality in widening likely related to the aneurysm and cardiomegaly above. 4. Bilateral pleural effusions with some adjacent areas of passive atelectatic change. Additional areas of subsegmental atelectatic collapse are seen in the posterior segment right upper lobe and posterior segment right lower lobe. Underlying airspace disease is difficult to exclude fully. 5. Endotracheal intubation with the tip of endotracheal to 4.8 cm from the carina. 6. Transesophageal tube in place with tip and side port beyond the margins of imaging.    Micro Data:  Flu 11/25 > neg COVID 11/25 > neg Urine cx  12/4: neg  Antimicrobials:   cefazolin 12/1 - 12/2 vanc 11/29 - 12/2   Interim history/subjective:  12/8: agreed pt will warrant extubation trial but prefer to do it more controlled than simply stopping sedation  without sbt etc and extubating. We have added anti-htn meds as when sedation weaned bp spikes >200. We have also scheduled valium/oxy and clonidine in order to attempt to wean iv  sedation. Previously precedex was unsuccessful and I nor family want to extubate pt only to need 5 point restraints in an awake but not alert pt. Extensive conversation had with daughter today via phone re: plan of oral to wean iv sedation and attempts at even brief sbt prior to extubation. However I discussed that since we will not be able to do typical assessment that while we expect he can maintain his airway we do not know what his mental status otherwise will be. I asked her to consider the potential need for reintubation +/- trach and she appropriately is hopeful that the pathology will be back before she and her family have to make that decision. I have offered to meet with her and any other family/friends that she feels would be beneficial and advocates for the patient later today or tomorrow to discuss this.  12/7: will attempt sbt today despite sedation and hopefully plan for extubation soon. Still not following commands. Will d/w family prior to extubation. Airway noted to be mallampati II, grade I view with mac 4. htn this am with sbp >190, starting PO agent as unable to wean sedation at this time with already this degree of hypertension. Too sedate this am for sbt  12/6: increased decadron yesterday. Continues with sharp waves in L temporal region on cEEG but no frank sz. Ammonia normal. Not following commands this am. Kicking legs oob and feet pulling on foley. 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 c-EEG ongoing - > This studyshowed evidence of epileptogenicity arising from left temporalregion. There is also severe diffuse encephalopathy, non specific etiology but likely secondary to sedation. On vent. Gets agitated easy. On Diprivan gtt, versed gtt, fentgtt. 40% fio2 on vent   Objective:  Blood pressure (!) 100/49, pulse 70, temperature 99.1 F (37.3 C), temperature source Oral, resp. rate 16, height _0  (1.905 m), weight (!) 142.6 kg, SpO2  95 %.    Vent Mode: PRVC FiO2 (%):  [30 %] 30 % Set Rate:  [16 bmp-18 bmp] 16 bmp Vt Set:  [670 mL] 670 mL PEEP:  [5 cmH20] 5 cmH20 Plateau Pressure:  [16 cmH20-19 cmH20] 19 cmH20   Intake/Output Summary (Last 24 hours) at 02/24/2020 1027 Last data filed at 02/24/2020 0900 Gross per 24 hour  Intake 2765.18 ml  Output 3860 ml  Net -1094.82 ml   Filed Weights   02/19/20 0400 02/22/20 0500 02/24/20 0246  Weight: (!) 140.3 kg (!) 146.9 kg (!) 142.6 kg     General Appearance:  Looks critically ill, restless supine in bed, OBESE - + Head:  Normocephalic, without obvious abnormality, atraumatic Eyes:  PERRL, conjunctiva injected   Ears:  Normal external ear canals, both ears Throat: ett in place Neck:  Supple,  No enlargement/tenderness/nodules Lungs: Clear to auscultation bilaterally, Heart:  S1 and S2 normal, no murmur Abdomen:  Obese, Soft, no masses, no organomegaly Extremities:  Extremities 5/5 in all 4, no c/c/e Skin:  intact in exposed areas . Sacral area - not examined Neurologic: not following commands. Moving all 4 extremities with great strength though.           Assessment & Plan:  Acute Resp failure due to brain mass and acute encephalopathy Plan PRVC VAP bundle -preferrably would get  pt to where he can follow commands but not likely a possibility at this time. Cont to optimize oral meds in order to wean iv sedation for him to tolerate even short sbt. At this time rr too low  -d/w daughter what he would desire should he fail extubation -pt has been intubated for ~6 days at this time.     Acute encephalopathy/hyperactive delirium due to left parietal tumor, requiring titration of IV sedative infusions -ammonia negative -LTM stopped 12/6  Seizure episode 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 Plan - fent/versed/propofol, agitation/htn with sbp >200 when weaned -aed per neuro -LTM d/c'd 12/6 - dexamethasone per neurosx - contnue seroquel,  increase dose.. periodic measuring of qt interval  -add clonidine for bp and agitation  -add scheduled valium and oxy - RASS goal 0 to -2  PAF - A Fib hx Hypertension: plan -Holding full dose anticoagulation due to recent brain surgery - timing restart per NSGY (on chemoprophy however) -cont lisinopril and add clonidine    Thoracic Aorta Aneurysm - new 02/20/20 Plan  - repeat CT chest in 6 months  Normocytic anemia Plan  -no acute signs of bleeding  Recurrent Hematuria when foley dc'ed - last noted 02/20/20 - 2nd episode. Suspect traumatic Plan  - will attempt condom catheter  Daily Goals Checklist  Pain/Anxiety/Delirium protocol (if indicated): see mar VAP protocol (if indicated): yes Blood pressure target: Keep systolic blood pressure less than 170 DVT prophylaxis: lovenox Nutrition Status: TF  GI prophylaxis protonix Urinary catheter: foley in place. Will attempt to d/c Central lines: None Glucose control: ssi Mobility/therapy needs: Bedrest, continues to require restraints for patient safety Code Status: Full Family Communication: duaghter via phone 12/8 Disposition: ICU      LABS    PULMONARY Recent Labs  Lab 02/17/20 1533 02/17/20 1703  PHART 7.344* 7.371  PCO2ART 45.9 43.6  PO2ART 72* 275*  HCO3 25.0 25.3  TCO2 26 27  O2SAT 93.0 100.0    CBC Recent Labs  Lab 02/22/20 0538 02/23/20 0349 02/24/20 0415  HGB 11.6* 11.9* 12.1*  HCT 35.3* 36.9* 36.9*  WBC 7.9 9.5 12.9*  PLT 270 295 309    COAGULATION No results for input(s): INR in the last 168 hours.  CARDIAC  No results for input(s): TROPONINI in the last 168 hours. No results for input(s): PROBNP in the last 168 hours.   CHEMISTRY Recent Labs  Lab 02/17/20 1703 02/17/20 1703 02/18/20 0520 02/18/20 0520 02/20/20 0933 02/20/20 0933 02/21/20 0540 02/22/20 0538 02/23/20 0349 02/23/20 0749 02/24/20 0415  NA 137  --  140  --  140  --   --  138  --  139  --   K 3.5   < > 3.4*    < > 4.1   < >  --  4.0  --  4.4  --   CL  --   --  106  --  107  --   --  106  --  106  --   CO2  --   --  21*  --  25  --   --  25  --  23  --   GLUCOSE  --   --  132*  --  120*  --   --  133*  --  134*  --   BUN  --   --  24*  --  25*  --   --  20  --  16  --   CREATININE  --   --  0.97  --  0.60*  --   --  0.54*  --  0.58*  --   CALCIUM  --   --  8.0*  --  8.1*  --   --  8.2*  --  8.3*  --   MG  --   --   --   --  2.2  --   --  2.0  --   --   --   PHOS  --   --   --   --  2.9  --  2.5 3.2 4.0  --  4.5   < > = values in this interval not displayed.   Estimated Creatinine Clearance: 149.6 mL/min (A) (by C-G formula based on SCr of 0.58 mg/dL (L)).   LIVER Recent Labs  Lab 02/20/20 0933 02/22/20 0538 02/23/20 0749  AST _0 ALT 66* 46* 45*  ALKPHOS 54 58 66  BILITOT 0.8 0.8 0.4  PROT 5.3* 4.9* 5.0*  ALBUMIN 2.6* 2.2* 2.3*     INFECTIOUS No results for input(s): LATICACIDVEN, PROCALCITON in the last 168 hours.   ENDOCRINE CBG (last 3)  Recent Labs    02/23/20 2322 02/24/20 0335 02/24/20 0758  GLUCAP 126* 117* 131*         IMAGING x48h: Critical care time: The patient is critically ill with multiple organ systems failure and requires high complexity decision making for assessment and support, frequent evaluation and titration of therapies, application of advanced monitoring technologies and extensive interpretation of multiple databases.  Critical care time 39 mins. This represents my time independent of the NPs time taking care of the pt. This is excluding procedures.    [facility] 02/24/2020, 10:27 AM

## 2020-02-25 ENCOUNTER — Inpatient Hospital Stay (HOSPITAL_COMMUNITY): Payer: BC Managed Care – PPO

## 2020-02-25 DIAGNOSIS — R4182 Altered mental status, unspecified: Secondary | ICD-10-CM | POA: Diagnosis not present

## 2020-02-25 DIAGNOSIS — J9601 Acute respiratory failure with hypoxia: Secondary | ICD-10-CM | POA: Diagnosis not present

## 2020-02-25 DIAGNOSIS — G9389 Other specified disorders of brain: Secondary | ICD-10-CM | POA: Diagnosis not present

## 2020-02-25 LAB — CBC WITH DIFFERENTIAL/PLATELET
Abs Immature Granulocytes: 0.89 10*3/uL — ABNORMAL HIGH (ref 0.00–0.07)
Basophils Absolute: 0.1 10*3/uL (ref 0.0–0.1)
Basophils Relative: 0 %
Eosinophils Absolute: 0.2 10*3/uL (ref 0.0–0.5)
Eosinophils Relative: 2 %
HCT: 36.9 % — ABNORMAL LOW (ref 39.0–52.0)
Hemoglobin: 11.6 g/dL — ABNORMAL LOW (ref 13.0–17.0)
Immature Granulocytes: 7 %
Lymphocytes Relative: 8 %
Lymphs Abs: 1.1 10*3/uL (ref 0.7–4.0)
MCH: 30.4 pg (ref 26.0–34.0)
MCHC: 31.4 g/dL (ref 30.0–36.0)
MCV: 96.6 fL (ref 80.0–100.0)
Monocytes Absolute: 1.2 10*3/uL — ABNORMAL HIGH (ref 0.1–1.0)
Monocytes Relative: 9 %
Neutro Abs: 9.5 10*3/uL — ABNORMAL HIGH (ref 1.7–7.7)
Neutrophils Relative %: 74 %
Platelets: 326 10*3/uL (ref 150–400)
RBC: 3.82 MIL/uL — ABNORMAL LOW (ref 4.22–5.81)
RDW: 14.5 % (ref 11.5–15.5)
WBC: 12.8 10*3/uL — ABNORMAL HIGH (ref 4.0–10.5)
nRBC: 0 % (ref 0.0–0.2)

## 2020-02-25 LAB — GLUCOSE, CAPILLARY
Glucose-Capillary: 131 mg/dL — ABNORMAL HIGH (ref 70–99)
Glucose-Capillary: 120 mg/dL — ABNORMAL HIGH (ref 70–99)
Glucose-Capillary: 107 mg/dL — ABNORMAL HIGH (ref 70–99)
Glucose-Capillary: 121 mg/dL — ABNORMAL HIGH (ref 70–99)
Glucose-Capillary: 112 mg/dL — ABNORMAL HIGH (ref 70–99)
Glucose-Capillary: 106 mg/dL — ABNORMAL HIGH (ref 70–99)

## 2020-02-25 LAB — PHOSPHORUS: Phosphorus: 3.9 mg/dL (ref 2.5–4.6)

## 2020-02-25 MED ORDER — CLONIDINE HCL 0.2 MG PO TABS
0.2000 mg | ORAL_TABLET | Freq: Three times a day (TID) | ORAL | Status: DC
  Administered 2020-02-25 – 2020-02-27 (×7): 0.2 mg
  Filled 2020-02-25 (×6): qty 1

## 2020-02-25 MED ORDER — VANCOMYCIN HCL 2000 MG/400ML IV SOLN
2000.0000 mg | Freq: Two times a day (BID) | INTRAVENOUS | Status: DC
  Administered 2020-02-25 – 2020-02-28 (×7): 2000 mg via INTRAVENOUS
  Filled 2020-02-25 (×8): qty 400

## 2020-02-25 MED ORDER — PHENOBARBITAL 20 MG/5ML PO ELIX
90.0000 mg | ORAL_SOLUTION | Freq: Two times a day (BID) | ORAL | Status: DC
  Administered 2020-02-25 – 2020-03-26 (×60): 90 mg
  Filled 2020-02-25 (×61): qty 22.5

## 2020-02-25 NOTE — Progress Notes (Signed)
Subjective: NAEs o/n  Objective: Vital signs in last 24 hours: Temp:  [98.3 F (36.8 C)-99.1 F (37.3 C)] 99 F (37.2 C) (12/09 1600) Pulse Rate:  [58-95] 71 (12/09 1700) Resp:  [9-18] 16 (12/09 1700) BP: (78-208)/(40-125) 101/62 (12/09 1700) SpO2:  [91 %-100 %] 95 % (12/09 1700) FiO2 (%):  [28 %-30 %] 28 % (12/09 1523) Weight:  [143.4 kg] 143.4 kg (12/09 0500)  Intake/Output from previous day: 12/08 0701 - 12/09 0700 In: 2639.9 [I.V.:1364.9; NG/GT:1205; IV Piggyback:70.1] Out: 3050 [Urine:3050] Intake/Output this shift: Total I/O In: 1542.4 [I.V.:802.5; NG/GT:715; IV Piggyback:25] Out: 1600 [Urine:1600]  Intubated Eyes open to voice, following voice.  Purposeful x 4 but restrained and does not follow commands. Incision c/d   Lab Results: Recent Labs    02/24/20 0415 02/25/20 0434  WBC 12.9* 12.8*  HGB 12.1* 11.6*  HCT 36.9* 36.9*  PLT 309 326   BMET Recent Labs    02/23/20 0749  NA 139  K 4.4  CL 106  CO2 23  GLUCOSE 134*  BUN 16  CREATININE 0.58*  CALCIUM 8.3*    Studies/Results: DG Chest Port 1 View  Result Date: 02/25/2020 CLINICAL DATA:  Pneumonia. EXAM: PORTABLE CHEST 1 VIEW COMPARISON:  Radiographs February 22, 2020. CT chest December 4, 21. FINDINGS: Endotracheal tube tip is approximately 3.2 cm above the carina. Enteric tube courses below the diaphragm in outside the field of view. Low lung volumes with left basilar opacities. No visible pleural effusions or pneumothorax. Similar widening of the mediastinal contour with ascending aortic aneurysm better characterized on recent CT chest. Similar cardiac silhouette. No acute osseous abnormality. IMPRESSION: 1. Low lung volumes with left basilar opacities, which could represent atelectasis, aspiration, and/or pneumonia. 2. Similar widening of the mediastinal contour with ascending aortic aneurysm better characterized on recent CT chest. Electronically Signed   By: Margaretha Sheffield MD   On: 02/25/2020  13:56    Assessment/Plan: 59 yo M with left parietal tumor s/p resection and persistent encephalopathy - family meeting today to help determine timing of extubation trial   Antonio Glenn 02/25/2020, 6:20 PM

## 2020-02-25 NOTE — Progress Notes (Signed)
   02/25/20 0801  Vent Select  Invasive or Noninvasive Invasive  Adult Vent Y  Airway 7.5 mm  Placement Date/Time: 02/17/20 (c) 1425   Grade View: Grade 1  Airway Device: Endotracheal Tube  Laryngoscope Blade: MAC;4  ETT Types: Oral  Size (mm): 7.5 mm  Cuffed: Cuffed;Min.occ.pres.  Insertion attempts: 1  Airway Equipment: Stylet  Placement Con...  Secured at (cm) 24 cm  Measured From Lips  Secured Location Left  Secured By Actuary Repositioned Yes  Prone position No  Cuff Pressure (cm H2O) 30 cm H2O  Site Condition Cool;Dry  Adult Ventilator Settings  Vent Type Servo i  Humidity HME  Vent Mode CPAP;PSV  FiO2 (%) 28 %  Pressure Support 10 cmH20  PEEP 5 cmH20  Adult Ventilator Measurements  Peak Airway Pressure 15 L/min  Mean Airway Pressure 8 cmH20  Resp Rate Spontaneous 15 br/min  Exhaled Vt 603 mL  Measured Ve 9.1 mL  Total PEEP 5 cmH20  SpO2 100 %  Adult Ventilator Alarms  Alarms On Y  Ve High Alarm 18 L/min  Ve Low Alarm 4 L/min  Resp Rate High Alarm 38 br/min  Resp Rate Low Alarm 8  PEEP Low Alarm 3 cmH2O  Press High Alarm 45 cmH2O  T Apnea 20 sec(s)  VAP Prevention  Cuff pressure (initial) 30 cm H2O  HOB> 30 Degrees Y  Equipment wiped down Yes  Daily Weaning Assessment  Daily Assessment of Readiness to Wean Wean protocol criteria met (SBT performed)  SBT Method  (PSV 10/5)  Weaning Start Time 0800  Patient response Passed (Tolerated well)  Breath Sounds  Bilateral Breath Sounds Diminished;Rhonchi  Vent Respiratory Assessment  Level of Consciousness Responds to Voice  Respiratory Pattern Regular;Unlabored  Patient Tolerance Tolerated well  Suction Method  Respiratory Interventions Airway suction;Oral suction  Oral Suctioning/Secretions  Suction Type Oral  Suction Device Yankauer  Secretion Amount Moderate  Secretion Color Tan  Secretion Consistency Thick  Suction Tolerance Tolerated well  Suctioning Adverse Effects None   Airway Suctioning/Secretions  Suction Type ETT  Suction Device  Catheter  Secretion Amount Moderate  Secretion Color Tan  Secretion Consistency Thick  Suction Tolerance Tolerated well  Suctioning Adverse Effects None

## 2020-02-25 NOTE — Progress Notes (Signed)
NAME:  Antonio Glenn, MRN:  [id number], DOB:  1961-01-26, LOS: 66 ADMISSION DATE:  02/11/2020, CONSULTATION DATE:  02/11/20 REFERRING MD:  Viona Gilmore  CHIEF COMPLAINT:  AMS / Seizures   Brief History   Antonio Glenn is a 59 y.o. male with hx of recently diagnosed brain tumor who was transferred from [facility] to [facility] 11/25 with AMS and seizures.  He is scheduled for left craniotomy on Dec 1 with Dr. Marcello Moores.  Past Medical History  Brain Mass.  Significant Hospital Events   11/25 > admit.  12/1 -  CRANIOTOMY OF  LEFT PARIETAL TUMOR   12./3 - NSGY recommending c-EEG given "subtle left parahippocampal/hippocampal T2 hyperintensity suggestive of recent seizures (this signal was not apparent 3 weeks ago, so new tumor infiltration in this area would be less likely)"> they are reducing steroids. On fent gtt/versed gtt -> and still restless moving all 4s. Opens eyes to voice but does not follow commands. RN asking about starting TF and removing foley. Some days ago had hematuria after self dc of foley. RN planning for enema. RD here for cortrak  12/4 -  ongoing eeg. On vent. On fent gtt, versed gtt and diprivan gtt. Restless when diprivan lowered to < 20 and hypotensive when diprivan increased to > 27mg/min though sedated. CT chest pending. NO issues after foley removal  Consults:  Neurosurgery neuro  Procedures:  12/1: crainiotomy  Significant Diagnostic Tests:  CT head 11/25 > 6 - 7cm mass in left parietal lobe with areas of internal cystic necrosis.  Mild regional mass effect with no MLS.     MRI brain 12/2: Expected postoperative changes post gross total resection of enhancing component of left parietotemporal mass. Minimal enhancement at the resection cavity margins is probably postoperative but small volume residual tumor is difficult to exclude due to irregular appearance of the preoperative mass.  T2 hyperintensity along the left hippocampus and posteromedial left thalamus. May  be related to recent seizure activity, postsurgical, or rapid progression of infiltrating tumor.  CT Chest 12/4 - IMPRESSION: 1. Marked dilatation of the ascending thoracic aorta to 4.7 cm remaining dilated at the distal aortic arch up to 3.6 cm returning to a more normal caliber of 3 cm by the diaphragmatic hiatus. No acute luminal abnormality is evident within the limitations of this non angiographic exam. Ascending thoracic aortic aneurysm. Recommend semi-annual imaging followup by CTA or MRA and referral to cardiothoracic surgery if not already obtained. This recommendation follows 2010 ACCF/AHA/AATS/ACR/ASA/SCA/SCAI/SIR/STS/SVM Guidelines for the Diagnosis and Management of Patients With Thoracic Aortic Disease. Circulation. 2010; 121:: Z735-A701 Aortic aneurysm NOS (ICD10-I71.9) 2. Cardiomegaly with predominantly left heart enlargement. 3. No discrete mediastinal mass or adenopathy. Contour abnormality in widening likely related to the aneurysm and cardiomegaly above. 4. Bilateral pleural effusions with some adjacent areas of passive atelectatic change. Additional areas of subsegmental atelectatic collapse are seen in the posterior segment right upper lobe and posterior segment right lower lobe. Underlying airspace disease is difficult to exclude fully. 5. Endotracheal intubation with the tip of endotracheal to 4.8 cm from the carina. 6. Transesophageal tube in place with tip and side port beyond the margins of imaging.    Micro Data:  Flu 11/25 > neg COVID 11/25 > neg Urine cx  12/4: neg resp cx 12/9:   Antimicrobials:   cefazolin 12/1 - 12/2 vanc 11/29 - 12/2   Interim history/subjective:  12/9: remains encephalopathic. Cont on oral meds, neuro considering increased dose of phenobarb. Did  tolerate some vent wean yesterday. Will hopefully be able to move to extubation trial in next 24-48 hours. Family meeting today to discuss pt's wishes should he fail.  Unfortunately still awaiting pathology. Copious tan secretions, tmax 100.3. will culture but hold on abx for now.  12/8: agreed pt will warrant extubation trial but prefer to do it more controlled than simply stopping sedation without sbt etc and extubating. We have added anti-htn meds as when sedation weaned bp spikes >200. We have also scheduled valium/oxy and clonidine in order to attempt to wean iv sedation. Previously precedex was unsuccessful and I nor family want to extubate pt only to need 5 point restraints in an awake but not alert pt. Extensive conversation had with daughter today via phone re: plan of oral to wean iv sedation and attempts at even brief sbt prior to extubation. However I discussed that since we will not be able to do typical assessment that while we expect he can maintain his airway we do not know what his mental status otherwise will be. I asked her to consider the potential need for reintubation +/- trach and she appropriately is hopeful that the pathology will be back before she and her family have to make that decision. I have offered to meet with her and any other family/friends that she feels would be beneficial and advocates for the patient later today or tomorrow to discuss this.  12/7: will attempt sbt today despite sedation and hopefully plan for extubation soon. Still not following commands. Will d/w family prior to extubation. Airway noted to be mallampati II, grade I view with mac 4. htn this am with sbp >190, starting PO agent as unable to wean sedation at this time with already this degree of hypertension. Too sedate this am for sbt  12/6: increased decadron yesterday. Continues with sharp waves in L temporal region on cEEG but no frank sz. Ammonia normal. Not following commands this am. Kicking legs oob and feet pulling on foley. 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 c-EEG ongoing - > This studyshowed evidence of  epileptogenicity arising from left temporalregion. There is also severe diffuse encephalopathy, non specific etiology but likely secondary to sedation. On vent. Gets agitated easy. On Diprivan gtt, versed gtt, fentgtt. 40% fio2 on vent   Objective:  Blood pressure (!) 170/95, pulse 95, temperature 98.3 F (36.8 C), temperature source Axillary, resp. rate 13, height _0  (1.905 m), weight (!) 143.4 kg, SpO2 97 %.    Vent Mode: CPAP;PSV FiO2 (%):  [28 %-30 %] 28 % Set Rate:  [16 bmp] 16 bmp Vt Set:  [670 mL] 670 mL PEEP:  [5 cmH20] 5 cmH20 Pressure Support:  [5 cmH20-10 cmH20] 10 cmH20 Plateau Pressure:  [17 cmH20-20 cmH20] 18 cmH20   Intake/Output Summary (Last 24 hours) at 02/25/2020 0900 Last data filed at 02/25/2020 0400 Gross per 24 hour  Intake 2384.29 ml  Output 3050 ml  Net -665.71 ml   Filed Weights   02/22/20 0500 02/24/20 0246 02/25/20 0500  Weight: (!) 146.9 kg (!) 142.6 kg (!) 143.4 kg     General Appearance:  Looks critically ill, restless supine in bed, OBESE  Head:  Normocephalic, without obvious abnormality, atraumatic Eyes:  PERRL, conjunctiva injected   Ears:  Normal external ear canals, both ears Throat: ett in place Neck:  Supple,  No enlargement/tenderness/nodules Lungs: Clear to auscultation bilaterally, Heart:  S1 and S2 normal, no murmur Abdomen:  Obese, Soft, no masses, no organomegaly Extremities:  Extremities 5/5 in all 4, no c/c/e Skin:  intact in exposed areas . Sacral area - not examined Neurologic: not following commands. Moving all 4 extremities with great strength though.           Assessment & Plan:  Acute Resp failure due to brain mass and acute encephalopathy Plan PRVC VAP bundle -preferrably would get pt to where he can follow commands but not likely a possibility at this time. Cont to optimize oral meds in order to wean iv sedation for him to tolerate even short sbt. At this time rr too low  -d/w daughter what he would desire  should he fail extubation, will have family meeting with daughter and mother today -pt has been intubated for ~7 days at this time.  -check cxr and sputum cx    Acute encephalopathy/hyperactive delirium due to left parietal tumor, requiring titration of IV sedative infusions -ammonia negative -LTM stopped 12/6 -neuro considering increased dose of phenobarb as pt remains restless and agitated with wean  Seizure episode 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 Plan - fent/versed/propofol, agitation/htn with sbp elevated albeit better with initiation of clonidine -aed per neuro -LTM d/c'd 12/6 - dexamethasone per neurosx - contnue seroquel. periodic measuring of qt interval (recheck tomorrow 12/10) -increase clonidine for bp and agitation  -cont scheduled valium and oxy - RASS goal 0 to -1  PAF - A Fib hx Hypertension: plan -Holding full dose anticoagulation due to recent brain surgery - timing restart per NSGY (on chemoprophy however) -cont lisinopril and increased clonidine    Thoracic Aorta Aneurysm - new 02/20/20 Plan  - repeat CT chest in 6 months  Normocytic anemia Plan  -no acute signs of bleeding  Recurrent Hematuria when foley dc'ed - last noted 02/20/20 - 2nd episode. Suspect traumatic Plan  - will attempt condom catheter  Daily Goals Checklist  Pain/Anxiety/Delirium protocol (if indicated): see mar VAP protocol (if indicated): yes Blood pressure target: Keep systolic blood pressure less than 170 DVT prophylaxis: lovenox Nutrition Status: TF  GI prophylaxis protonix Urinary catheter: foley removed 12/9 Central lines: picc Glucose control: ssi Mobility/therapy needs: Bedrest, continues to require restraints for patient safety Code Status: Full Family Communication: duaghter via phone 12/8.. family meeting planned today 12/9 Disposition: ICU      LABS    PULMONARY No results for input(s): PHART, PCO2ART, PO2ART, HCO3, TCO2, O2SAT in the  last 168 hours.  Invalid input(s): PCO2, PO2  CBC Recent Labs  Lab 02/23/20 0349 02/24/20 0415 02/25/20 0434  HGB 11.9* 12.1* 11.6*  HCT 36.9* 36.9* 36.9*  WBC 9.5 12.9* 12.8*  PLT 295 309 326    COAGULATION No results for input(s): INR in the last 168 hours.  CARDIAC  No results for input(s): TROPONINI in the last 168 hours. No results for input(s): PROBNP in the last 168 hours.   CHEMISTRY Recent Labs  Lab 02/20/20 0933 02/21/20 0540 02/22/20 0538 02/23/20 0349 02/23/20 0749 02/24/20 0415 02/25/20 0434  NA 140  --  138  --  139  --   --   K 4.1  --  4.0  --  4.4  --   --   CL 107  --  106  --  106  --   --   CO2 25  --  25  --  23  --   --   GLUCOSE 120*  --  133*  --  134*  --   --  BUN 25*  --  20  --  16  --   --   CREATININE 0.60*  --  0.54*  --  0.58*  --   --   CALCIUM 8.1*  --  8.2*  --  8.3*  --   --   MG 2.2  --  2.0  --   --   --   --   PHOS 2.9 2.5 3.2 4.0  --  4.5 3.9   Estimated Creatinine Clearance: 150.1 mL/min (A) (by C-G formula based on SCr of 0.58 mg/dL (L)).   LIVER Recent Labs  Lab 02/20/20 0933 02/22/20 0538 02/23/20 0749  AST _0 ALT 66* 46* 45*  ALKPHOS 54 58 66  BILITOT 0.8 0.8 0.4  PROT 5.3* 4.9* 5.0*  ALBUMIN 2.6* 2.2* 2.3*     INFECTIOUS No results for input(s): LATICACIDVEN, PROCALCITON in the last 168 hours.   ENDOCRINE CBG (last 3)  Recent Labs    02/24/20 2328 02/25/20 0336 02/25/20 0741  GLUCAP 98 131* 120*         IMAGING x48h: Critical care time: The patient is critically ill with multiple organ systems failure and requires high complexity decision making for assessment and support, frequent evaluation and titration of therapies, application of advanced monitoring technologies and extensive interpretation of multiple databases.  Critical care time 56 mins. This represents my time independent of the NPs time taking care of the pt. This is excluding procedures.    [facility] 02/25/2020, 9:00 AM

## 2020-02-25 NOTE — Progress Notes (Signed)
Lengthy conversation with pt's daughter and ex-wife (hcpoa) who are in agreement with care plan for their loved one.   We have gone over his current illness and delirium/encephalopathy. They have requested that if possible (unless his mentation greatly improves prior to this) he remain intubated until pathology back.  He has living will papers and they feel as though he would not want reintubation/trach/snf placement should his diagnosis be poor prognosis with high grade gbm. We have not received the pathology but after I personally spoke with pathology who stated that the sample was sent off to [facility] neurology pathology for evaluation on 12/7.   His papers do indicate that he would not want life prolonging measures should he have an incurable disease/condition.   His daughter is getting married in 36 month and they are appropriately very much struggling with these decisions and his change but also want to honor his wishes.   At this time we will cont the uptitration of oral medications Phenobarb Clonidine Valium Oxy  In order to wean iv medication to hopefully allow for mental status to improve.  We will cont vent weaning and daily sbt's We will also start vanc in light of cxr, increased secretions, temp to 100.3 and gram stain. Should cx finalize as negative then can stop.   They have opted for DNR at this time.  I have updated RN and placed orders.

## 2020-02-25 NOTE — Progress Notes (Signed)
Pharmacy Antibiotic Note  Antonio Glenn is a 59 y.o. male admitted on 02/11/2020 with mental status changes and seizures, now with TA with GPC and possible pneumonia.  Pharmacy has been consulted for Vnacomycin dosing. Low grade fever 100, WBC elevated 12 on dexamethasone, Cr 0.6,   Plan: Vancomycin 2gm IV q12h x5 days then will readdress with MD  Height: 6\' 3"  (190.5 cm) Weight: (!) 143.4 kg (316 lb 2.2 oz) IBW/kg (Calculated) : 84.5  Temp (24hrs), Avg:98.7 F (37.1 C), Min:98.3 F (36.8 C), Max:99.1 F (37.3 C)  Recent Labs  Lab 02/20/20 0933 02/21/20 0540 02/22/20 0538 02/23/20 0349 02/23/20 0749 02/24/20 0415 02/25/20 0434  WBC 9.5 8.9 7.9 9.5  --  12.9* 12.8*  CREATININE 0.60*  --  0.54*  --  0.58*  --   --     Estimated Creatinine Clearance: 150.1 mL/min (A) (by C-G formula based on SCr of 0.58 mg/dL (L)).    No Known Allergies  Antimicrobials this admission:    Dose adjustments this admission:   Microbiology results: 12/9 TA GPC   Antonio Glenn Pharm.D. CPP, BCPS Clinical Pharmacist [phone] 02/25/2020 4:46 PM

## 2020-02-25 NOTE — Progress Notes (Signed)
Subjective: No acute events overnight. Per RN, he was agitated and therefore had received a bolus just prior to the exam  ROS: Unable to obtain due to poor mental status  Examination  Vital signs in last 24 hours: Temp:  [98.3 F (36.8 C)-100.3 F (37.9 C)] 98.3 F (36.8 C) (12/09 0800) Pulse Rate:  [58-95] 95 (12/09 0815) Resp:  [10-18] 13 (12/09 0815) BP: (92-207)/(49-168) 170/95 (12/09 0815) SpO2:  [90 %-100 %] 97 % (12/09 0815) Arterial Line BP: (97-104)/(48) 104/48 (12/08 1200) FiO2 (%):  [28 %-30 %] 28 % (12/09 0801) Weight:  [143.4 kg] 143.4 kg (12/09 0500)  General: lying in bed, not in apparent distress CVS: pulse-normal rate and rhythm RS: breathing comfortably, intubated Extremities: normal, warm  Neuro: Didnt open eyes to noxious stimuli, does not follow commands, withdraws to noxious in all 4 extremities  Basic Metabolic Panel: Recent Labs  Lab 02/20/20 0933 02/21/20 0540 02/22/20 0538 02/23/20 0349 02/23/20 0749 02/24/20 0415 02/25/20 0434  NA 140  --  138  --  139  --   --   K 4.1  --  4.0  --  4.4  --   --   CL 107  --  106  --  106  --   --   CO2 25  --  25  --  23  --   --   GLUCOSE 120*  --  133*  --  134*  --   --   BUN 25*  --  20  --  16  --   --   CREATININE 0.60*  --  0.54*  --  0.58*  --   --   CALCIUM 8.1*  --  8.2*  --  8.3*  --   --   MG 2.2  --  2.0  --   --   --   --   PHOS 2.9 2.5 3.2 4.0  --  4.5 3.9    CBC: Recent Labs  Lab 02/21/20 0540 02/22/20 0538 02/23/20 0349 02/24/20 0415 02/25/20 0434  WBC 8.9 7.9 9.5 12.9* 12.8*  NEUTROABS 6.2 5.1 6.1 9.7* 9.5*  HGB 11.8* 11.6* 11.9* 12.1* 11.6*  HCT 35.6* 35.3* 36.9* 36.9* 36.9*  MCV 93.7 94.1 94.4 94.4 96.6  PLT 255 270 295 309 326     Coagulation Studies: No results for input(s): LABPROT, INR in the last 72 hours.  Imaging No new brain imaging overnight  ASSESSMENT AND PLAN: 59 year old male with recent diagnosis of left parietal mass concerning for GBM, seizures  with persistent encephalopathy which is suspected to be out of proportion to underlying mass.  Left parietal mass, suspected GBMs/p resection Seizures due to underlying mass Acute encephalopathy -Acute encephalopathy likely due to underlying mass, edema, medications/steroid psychosis, possible seizures.  Recommendations: - Continue lacosamide 100 mg twice daily, Valium 5mg  TID - Will increase Phenobarb to 90mg  BID - Family meeting after path report to discuss goals of care. -Continue seizure precautions -Management of rest of comorbidities per primary team  I have spent a total of14minuteswith the patient reviewing hospitalnotes, test results, labs and examining the patient as well as establishing an assessment and plan.>50% of time was spent in direct patient care.   [facility]eb [facility] For questions after 5pm please refer to AMION to reach the Neurologist on call

## 2020-02-26 DIAGNOSIS — R4182 Altered mental status, unspecified: Secondary | ICD-10-CM | POA: Diagnosis not present

## 2020-02-26 DIAGNOSIS — G9389 Other specified disorders of brain: Secondary | ICD-10-CM | POA: Diagnosis not present

## 2020-02-26 DIAGNOSIS — J9601 Acute respiratory failure with hypoxia: Secondary | ICD-10-CM | POA: Diagnosis not present

## 2020-02-26 DIAGNOSIS — G9340 Encephalopathy, unspecified: Secondary | ICD-10-CM | POA: Diagnosis not present

## 2020-02-26 LAB — GLUCOSE, CAPILLARY
Glucose-Capillary: 106 mg/dL — ABNORMAL HIGH (ref 70–99)
Glucose-Capillary: 107 mg/dL — ABNORMAL HIGH (ref 70–99)
Glucose-Capillary: 108 mg/dL — ABNORMAL HIGH (ref 70–99)
Glucose-Capillary: 112 mg/dL — ABNORMAL HIGH (ref 70–99)
Glucose-Capillary: 130 mg/dL — ABNORMAL HIGH (ref 70–99)
Glucose-Capillary: 95 mg/dL (ref 70–99)

## 2020-02-26 LAB — CBC WITH DIFFERENTIAL/PLATELET
Abs Immature Granulocytes: 0.85 10*3/uL — ABNORMAL HIGH (ref 0.00–0.07)
Basophils Absolute: 0.1 10*3/uL (ref 0.0–0.1)
Basophils Relative: 1 %
Eosinophils Absolute: 0.3 10*3/uL (ref 0.0–0.5)
Eosinophils Relative: 2 %
HCT: 40.3 % (ref 39.0–52.0)
Hemoglobin: 12.9 g/dL — ABNORMAL LOW (ref 13.0–17.0)
Immature Granulocytes: 7 %
Lymphocytes Relative: 10 %
Lymphs Abs: 1.3 10*3/uL (ref 0.7–4.0)
MCH: 30.8 pg (ref 26.0–34.0)
MCHC: 32 g/dL (ref 30.0–36.0)
MCV: 96.2 fL (ref 80.0–100.0)
Monocytes Absolute: 1.3 10*3/uL — ABNORMAL HIGH (ref 0.1–1.0)
Monocytes Relative: 10 %
Neutro Abs: 9.3 10*3/uL — ABNORMAL HIGH (ref 1.7–7.7)
Neutrophils Relative %: 70 %
Platelets: 374 10*3/uL (ref 150–400)
RBC: 4.19 MIL/uL — ABNORMAL LOW (ref 4.22–5.81)
RDW: 14.1 % (ref 11.5–15.5)
WBC: 13.1 10*3/uL — ABNORMAL HIGH (ref 4.0–10.5)
nRBC: 0 % (ref 0.0–0.2)

## 2020-02-26 LAB — BASIC METABOLIC PANEL
Anion gap: 13 (ref 5–15)
BUN: 27 mg/dL — ABNORMAL HIGH (ref 6–20)
CO2: 22 mmol/L (ref 22–32)
Calcium: 8.2 mg/dL — ABNORMAL LOW (ref 8.9–10.3)
Chloride: 104 mmol/L (ref 98–111)
Creatinine, Ser: 0.69 mg/dL (ref 0.61–1.24)
GFR, Estimated: >60 mL/min (ref >60)
Glucose, Bld: 109 mg/dL — ABNORMAL HIGH (ref 70–99)
Potassium: 4.2 mmol/L (ref 3.5–5.1)
Sodium: 139 mmol/L (ref 135–145)

## 2020-02-26 LAB — TRIGLYCERIDES: Triglycerides: 114 mg/dL (ref <150)

## 2020-02-26 LAB — MAGNESIUM: Magnesium: 2.2 mg/dL (ref 1.7–2.4)

## 2020-02-26 MED ORDER — QUETIAPINE FUMARATE 100 MG PO TABS
200.0000 mg | ORAL_TABLET | Freq: Two times a day (BID) | ORAL | Status: DC
  Administered 2020-02-26 – 2020-03-26 (×58): 200 mg
  Filled 2020-02-26 (×2): qty 2
  Filled 2020-02-26: qty 1
  Filled 2020-02-26 (×3): qty 2
  Filled 2020-02-26 (×2): qty 1
  Filled 2020-02-26: qty 2
  Filled 2020-02-26: qty 1
  Filled 2020-02-26 (×5): qty 2
  Filled 2020-02-26 (×2): qty 1
  Filled 2020-02-26 (×2): qty 2
  Filled 2020-02-26 (×2): qty 1
  Filled 2020-02-26 (×6): qty 2
  Filled 2020-02-26: qty 1
  Filled 2020-02-26 (×5): qty 2
  Filled 2020-02-26: qty 1
  Filled 2020-02-26: qty 2
  Filled 2020-02-26: qty 1
  Filled 2020-02-26 (×2): qty 2
  Filled 2020-02-26: qty 1
  Filled 2020-02-26: qty 2
  Filled 2020-02-26 (×2): qty 1
  Filled 2020-02-26 (×2): qty 2
  Filled 2020-02-26: qty 1
  Filled 2020-02-26 (×3): qty 2
  Filled 2020-02-26: qty 1
  Filled 2020-02-26: qty 2
  Filled 2020-02-26: qty 1
  Filled 2020-02-26 (×2): qty 2
  Filled 2020-02-26: qty 1
  Filled 2020-02-26: qty 2
  Filled 2020-02-26 (×2): qty 1
  Filled 2020-02-26 (×2): qty 2
  Filled 2020-02-26: qty 1

## 2020-02-26 MED ORDER — SODIUM CHLORIDE 0.9 % IV SOLN
250.0000 mL | INTRAVENOUS | Status: DC
  Administered 2020-02-26 – 2020-02-28 (×2): 250 mL via INTRAVENOUS

## 2020-02-26 MED ORDER — SODIUM CHLORIDE 0.9 % IV SOLN: INTRAVENOUS | Status: DC | PRN

## 2020-02-26 MED ORDER — NOREPINEPHRINE 4 MG/250ML-% IV SOLN: 2.0000 ug/min | INTRAVENOUS | Status: DC

## 2020-02-26 MED ORDER — OXYCODONE HCL 5 MG PO TABS
10.0000 mg | ORAL_TABLET | Freq: Four times a day (QID) | ORAL | Status: DC
  Administered 2020-02-26 – 2020-03-06 (×33): 10 mg
  Filled 2020-02-26 (×35): qty 2

## 2020-02-26 MED ORDER — NOREPINEPHRINE 4 MG/250ML-% IV SOLN
0.0000 ug/min | INTRAVENOUS | Status: DC
  Administered 2020-02-26: 8 ug/min via INTRAVENOUS
  Administered 2020-02-28: 3 ug/min via INTRAVENOUS
  Administered 2020-02-29: 2 ug/min via INTRAVENOUS
  Filled 2020-02-26 (×2): qty 250

## 2020-02-26 MED ORDER — MIDAZOLAM HCL 2 MG/2ML IJ SOLN: 1.0000 mg | INTRAMUSCULAR | Status: DC | PRN

## 2020-02-26 NOTE — Progress Notes (Signed)
Patient continues to buck ventilation. Pressor started and sedation restarted however patient HR dropping to SB, Contacted Dr. Gillermina Phy. Patient's vent settings changed to cpap mode.   SBARR transferred to [facility] RN to continue care with patient at this time.

## 2020-02-26 NOTE — Progress Notes (Addendum)
NAME:  Antonio Glenn, MRN:  [id number], DOB:  1960/09/04, LOS: 102 ADMISSION DATE:  02/11/2020, CONSULTATION DATE:  02/11/20 REFERRING MD:  Viona Gilmore  CHIEF COMPLAINT:  AMS / Seizures   Brief History   Antonio Glenn is a 59 y.o. male with hx of recently diagnosed brain tumor who was transferred from [place] ED to [facility] 11/25 with AMS and seizures.  He is scheduled for left craniotomy on Dec 1 with Dr. Marcello Moores.  Past Medical History  Brain Mass.  Significant Hospital Events   11/25 > admit.  12/1 -  CRANIOTOMY OF  LEFT PARIETAL TUMOR   12./3 - NSGY recommending c-EEG given "subtle left parahippocampal/hippocampal T2 hyperintensity suggestive of recent seizures (this signal was not apparent 3 weeks ago, so new tumor infiltration in this area would be less likely)"> they are reducing steroids. On fent gtt/versed gtt -> and still restless moving all 4s. Opens eyes to voice but does not follow commands. RN asking about starting TF and removing foley. Some days ago had hematuria after self dc of foley. RN planning for enema. RD here for cortrak  12/4 -  ongoing eeg. On vent. On fent gtt, versed gtt and diprivan gtt. Restless when diprivan lowered to < 20 and hypotensive when diprivan increased to > 55mg/min though sedated. CT chest pending. NO issues after foley removal  Consults:  Neurosurgery neuro  Procedures:  12/1: crainiotomy  Significant Diagnostic Tests:  CT head 11/25 > 6 - 7cm mass in left parietal lobe with areas of internal cystic necrosis.  Mild regional mass effect with no MLS.     MRI brain 12/2: Expected postoperative changes post gross total resection of enhancing component of left parietotemporal mass. Minimal enhancement at the resection cavity margins is probably postoperative but small volume residual tumor is difficult to exclude due to irregular appearance of the preoperative mass.  T2 hyperintensity along the left hippocampus and posteromedial left thalamus. May  be related to recent seizure activity, postsurgical, or rapid progression of infiltrating tumor.  CT Chest 12/4 - dilated ascending thoracic aorta to 4.7 cm  Bilateral pleural effusions with some adjacent areas of passive atelectatic change.     Micro Data:  Flu 11/25 > neg COVID 11/25 > neg Urine cx  12/4: neg resp cx 12/9: staph >>  Antimicrobials:   cefazolin 12/1 - 12/2 vanc 11/29 - 12/2, 12/9>>   Interim history/subjective:  12/9: remains encephalopathic. Cont on oral meds, neuro considering increased dose of phenobarb. Did tolerate some vent wean yesterday. Will hopefully be able to move to extubation trial in next 24-48 hours. Family meeting today to discuss pt's wishes should he fail. Unfortunately still awaiting pathology. Copious tan secretions, tmax 100.3. will culture but hold on abx for now.  12/8: agreed pt will warrant extubation trial but prefer to do it more controlled than simply stopping sedation without sbt etc and extubating. We have added anti-htn meds as when sedation weaned bp spikes >200. We have also scheduled valium/oxy and clonidine in order to attempt to wean iv sedation. Previously precedex was unsuccessful and I nor family want to extubate pt only to need 5 point restraints in an awake but not alert pt. Extensive conversation had with daughter today via phone re: plan of oral to wean iv sedation and attempts at even brief sbt prior to extubation. However I discussed that since we will not be able to do typical assessment that while we expect he can maintain his airway we  do not know what his mental status otherwise will be. I asked her to consider the potential need for reintubation +/- trach and she appropriately is hopeful that the pathology will be back before she and her family have to make that decision. I have offered to meet with her and any other family/friends that she feels would be beneficial and advocates for the patient later today or tomorrow to  discuss this.  12/7: will attempt sbt today despite sedation and hopefully plan for extubation soon. Still not following commands. Will d/w family prior to extubation. Airway noted to be mallampati II, grade I view with mac 4. htn this am with sbp >190, starting PO agent as unable to wean sedation at this time with already this degree of hypertension. Too sedate this am for sbt  12/6: increased decadron yesterday. Continues with sharp waves in L temporal region on cEEG but no frank sz. Ammonia normal. Not following commands this am. Kicking legs oob and feet pulling on foley. 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 c-EEG ongoing - > This studyshowed evidence of epileptogenicity arising from left temporalregion. There is also severe diffuse encephalopathy, non specific etiology but likely secondary to sedation. On vent. Gets agitated easy. On Diprivan gtt, versed gtt, fentgtt. 40% fio2 on vent    12/10 -agitated throwing his legs around in spite of low-dose propofol, Versed and fentanyl drips Afebrile Good urine output Objective:  Blood pressure 134/73, pulse 70, temperature 98.5 F (36.9 C), temperature source Axillary, resp. rate 19, height _0  (1.905 m), weight (!) 143.4 kg, SpO2 96 %.    Vent Mode: PRVC FiO2 (%):  [28 %-30 %] 30 % Set Rate:  [16 bmp] 16 bmp Vt Set:  [119 JY-7829 mL] 6870 mL PEEP:  [5 cmH20] 5 cmH20 Plateau Pressure:  [14 cmH20-20 cmH20] 19 cmH20   Intake/Output Summary (Last 24 hours) at 02/26/2020 0935 Last data filed at 02/26/2020 5621 Gross per 24 hour  Intake 3451.73 ml  Output 2500 ml  Net 951.73 ml   Filed Weights   02/24/20 0246 02/25/20 0500 02/26/20 0420  Weight: (!) 142.6 kg (!) 143.4 kg (!) 143.4 kg     General Appearance-obese man, flailing around in bed, mild distress Head:  Normocephalic, without obvious abnormality, atraumatic Eyes:  PERRL, conjunctiva injected   Ears:  Normal external ear  canals, both ears Throat: ett in place Neck:  Supple,  No enlargement/tenderness/nodules Lungs: Bilateral ventilated breath sounds, no accessory muscle use Heart:  S1 and S2 normal, no murmur Abdomen:  Obese, Soft, no masses, no organomegaly Extremities:  Extremities 5/5 in all 4, no c/c/e Skin:  intact in exposed areas . Sacral area - not examined Neurologic: All commands, moves all 4 extremities   Chest x-ray 12/9 independently reviewed, bibasilar atelectasis, wide mediastinum as prior  Labs show normal electrolytes, Mild leukocytosis,     Assessment & Plan:  Acute Resp failure due to brain mass and acute encephalopathy Plan Attempt spontaneous breathing trials At this time poor mental status is the main barrier to extubation Per family discussion 12/9, no reintubation and DNR issued   Acute encephalopathy/hyperactive delirium due to left parietal tumor, requiring titration of IV sedative infusions -ammonia negative -LTM stopped 12/6 -neuro guiding AED -Using phenobarbital, clonidine, Valium, oxycodone to try to wean IV sedation -Monitor QT interval intermittently while on Seroquel -We will maximize propofol and try to come off Versed drip and use as needed only  RASS goal 0 to -1  Seizure episode 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 Plan -aed per neuro -LTM d/c'd 12/6 - dexamethasone per neurosx   Staph HAP -continue vancomycin, simplify once organism identified   PAF - A Fib hx Hypertension: plan -Holding full dose anticoagulation due to recent brain surgery - timing restart per NSGY (on chemoprophy however) -dc lisinopril and increased clonidine    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 Suspect traumatic Plan  - condom catheter  Daily Goals Checklist  Pain/Anxiety/Delirium protocol (if indicated): see mar VAP protocol (if  indicated): yes Blood pressure target: Keep systolic blood pressure less than 170 DVT prophylaxis: lovenox Nutrition Status: TF  GI prophylaxis protonix Urinary catheter: foley removed 12/9 Central lines: picc Glucose control: ssi Mobility/therapy needs: Bedrest, continues to require restraints for patient safety Code Status: Full Family Communication: Discussion 12/9-DNR issued, no reintubation await final pathology Disposition: ICU      LABS    PULMONARY No results for input(s): PHART, PCO2ART, PO2ART, HCO3, TCO2, O2SAT in the last 168 hours.  Invalid input(s): PCO2, PO2  CBC Recent Labs  Lab 02/24/20 0415 02/25/20 0434 02/26/20 0413  HGB 12.1* 11.6* 12.9*  HCT 36.9* 36.9* 40.3  WBC 12.9* 12.8* 13.1*  PLT 309 326 374    COAGULATION No results for input(s): INR in the last 168 hours.  CARDIAC  No results for input(s): TROPONINI in the last 168 hours. No results for input(s): PROBNP in the last 168 hours.   CHEMISTRY Recent Labs  Lab 02/20/20 0933 02/21/20 0540 02/22/20 0538 02/23/20 0349 02/23/20 0749 02/24/20 0415 02/25/20 0434 02/26/20 0413  NA 140  --  138  --  139  --   --  139  K 4.1  --  4.0  --  4.4  --   --  4.2  CL 107  --  106  --  106  --   --  104  CO2 25  --  25  --  23  --   --  22  GLUCOSE 120*  --  133*  --  134*  --   --  109*  BUN 25*  --  20  --  16  --   --  27*  CREATININE 0.60*  --  0.54*  --  0.58*  --   --  0.69  CALCIUM 8.1*  --  8.2*  --  8.3*  --   --  8.2*  MG 2.2  --  2.0  --   --   --   --  2.2  PHOS 2.9 2.5 3.2 4.0  --  4.5 3.9  --    Estimated Creatinine Clearance: 150.1 mL/min (by C-G formula based on SCr of 0.69 mg/dL).   LIVER Recent Labs  Lab 02/20/20 0933 02/22/20 0538 02/23/20 0749  AST _0 ALT 66* 46* 45*  ALKPHOS 54 58 66  BILITOT 0.8 0.8 0.4  PROT 5.3* 4.9* 5.0*  ALBUMIN 2.6* 2.2* 2.3*     INFECTIOUS No results for input(s): LATICACIDVEN, PROCALCITON in the last 168  hours.   ENDOCRINE CBG (last 3)  Recent Labs    02/25/20 2309 02/26/20 0326 02/26/20 0752  GLUCAP 106* 112* 95    The patient is critically ill with multiple organ systems failure and requires high complexity decision making for assessment and  support, frequent evaluation and titration of therapies, application of advanced monitoring technologies and extensive interpretation of multiple databases. Critical Care Time devoted to patient care services described in this note independent of APP/resident  time is 32 minutes.   Kara Mead MD. Shade Flood. [facility] See Amion for pager  If no response to pager , please call [phone]  After 7:00 pm call Elink  [phone]    02/26/2020, 9:35 AM

## 2020-02-26 NOTE — Progress Notes (Signed)
eLink Physician-Brief Progress Note Patient Name: Antonio Glenn DOB: [birth year] MRN: [id number]   Date of Service  02/26/2020  HPI/Events of Note  RN requests pressor for sedation-related hypotension. Sedation has had to be increased due to substantial ventilator dyssynchrony.   eICU Interventions  Ordered levophed.     Intervention Category Intermediate Interventions: Hypotension - evaluation and management  Marily Lente Darell Saputo 02/26/2020, 11:14 PM

## 2020-02-26 NOTE — Progress Notes (Signed)
Neurosurgery  Pt seen and examined this morning.  Intubated Eyes open spontaneously but half-open, regards appropriately.  MAEs, restrained, does not follow commands.  Left parietal mass s/p resection with associates epilepsy in whom preoperative encephalopathy has persisted.  Main barrier to extubation has been his severe agitation which was present preoperatively that has required large doses of sedative medicatio ns.  He will require an extubation trial as this is the only way to fully determine his neurologic status and thus his neurologic prognosis.  Timing being determined by the critical care team.  Will continue to wean dexamethasone.

## 2020-02-26 NOTE — Progress Notes (Incomplete)
Patient continues to buck ventilation. Pressor started and sedation restarted however patient HR dropping to SB.

## 2020-02-26 NOTE — Progress Notes (Signed)
Subjective: No acute events overnight.  ROS: Unable to obtain due to poor mental status  Examination  Vital signs in last 24 hours: Temp:  [98.5 F (36.9 C)-99.7 F (37.6 C)] 98.5 F (36.9 C) (12/10 0800) Pulse Rate:  [59-94] 70 (12/10 0800) Resp:  [14-23] 19 (12/10 0800) BP: (78-184)/(40-105) 134/73 (12/10 0800) SpO2:  [91 %-99 %] 96 % (12/10 0800) FiO2 (%):  [28 %-30 %] 30 % (12/10 0755) Weight:  [143.4 kg] 143.4 kg (12/10 0420)  General: lying in bed,not in apparent distress CVS: pulse-normal rate and rhythm RS: breathing comfortably,intubated Extremities: normal,warm  Neuro: On propofol at 60 MCG per hour,didnt open eyes to noxious stimuli, does not follow commands, PERRLA, corneal reflex intact, gag reflex intact, didnt withdraws to noxious stimuli in all 4 extremities  Basic Metabolic Panel: Recent Labs  Lab 02/20/20 0933 02/21/20 0540 02/22/20 0538 02/23/20 0349 02/23/20 0749 02/24/20 0415 02/25/20 0434 02/26/20 0413  NA 140  --  138  --  139  --   --  139  K 4.1  --  4.0  --  4.4  --   --  4.2  CL 107  --  106  --  106  --   --  104  CO2 25  --  25  --  23  --   --  22  GLUCOSE 120*  --  133*  --  134*  --   --  109*  BUN 25*  --  20  --  16  --   --  27*  CREATININE 0.60*  --  0.54*  --  0.58*  --   --  0.69  CALCIUM 8.1*  --  8.2*  --  8.3*  --   --  8.2*  MG 2.2  --  2.0  --   --   --   --  2.2  PHOS 2.9 2.5 3.2 4.0  --  4.5 3.9  --     CBC: Recent Labs  Lab 02/22/20 0538 02/23/20 0349 02/24/20 0415 02/25/20 0434 02/26/20 0413  WBC 7.9 9.5 12.9* 12.8* 13.1*  NEUTROABS 5.1 6.1 9.7* 9.5* 9.3*  HGB 11.6* 11.9* 12.1* 11.6* 12.9*  HCT 35.3* 36.9* 36.9* 36.9* 40.3  MCV 94.1 94.4 94.4 96.6 96.2  PLT 270 295 309 326 374     Coagulation Studies: No results for input(s): LABPROT, INR in the last 72 hours.  Imaging No new brain imaging overnight  ASSESSMENT AND PLAN:59 year old male with recent diagnosis of left parietal mass concerning  for GBM, seizures with persistent encephalopathy which is suspected to be out of proportion to underlying mass.  Left parietal mass, suspected GBMs/p resection Seizures due to underlying mass Acute encephalopathy -Acute encephalopathy likely due to underlying mass, edema, medications/steroid psychosis, possible seizures.  Recommendations: - Continue lacosamide 100 mg twice daily, Valium 5mg  TID and phenobarb 120 mg twice daily - Family meeting after path report to discuss goals of care. -Continue seizure precautions -Management of rest of comorbidities per primary team  I have spent a total of47minuteswith the patient reviewing hospitalnotes, test results, labs and examining the patient as well as establishing an assessment and plan.>50% of time was spent in direct patient care.   Zeb Comfort Epilepsy [facility] For questions after 5pm please refer to AMION to reach the Neurologist on call

## 2020-02-26 NOTE — Progress Notes (Signed)
Writer contacted e-link in regards to drop in patients BP when sedated. When sedation is down patient does not tolerate ventilation, agitated and bucking over vent. Patient sedated in order to tolerate vent however MAP now in 40's. Pressor to be ordered.

## 2020-02-27 DIAGNOSIS — Y95 Nosocomial condition: Secondary | ICD-10-CM | POA: Diagnosis not present

## 2020-02-27 DIAGNOSIS — J9690 Respiratory failure, unspecified, unspecified whether with hypoxia or hypercapnia: Secondary | ICD-10-CM

## 2020-02-27 DIAGNOSIS — J9601 Acute respiratory failure with hypoxia: Secondary | ICD-10-CM | POA: Diagnosis not present

## 2020-02-27 DIAGNOSIS — G9340 Encephalopathy, unspecified: Secondary | ICD-10-CM | POA: Diagnosis not present

## 2020-02-27 DIAGNOSIS — R4182 Altered mental status, unspecified: Secondary | ICD-10-CM | POA: Diagnosis not present

## 2020-02-27 DIAGNOSIS — J189 Pneumonia, unspecified organism: Secondary | ICD-10-CM

## 2020-02-27 LAB — CBC WITH DIFFERENTIAL/PLATELET
Abs Immature Granulocytes: 0.57 10*3/uL — ABNORMAL HIGH (ref 0.00–0.07)
Basophils Absolute: 0.1 10*3/uL (ref 0.0–0.1)
Basophils Relative: 1 %
Eosinophils Absolute: 0.3 10*3/uL (ref 0.0–0.5)
Eosinophils Relative: 2 %
HCT: 40.7 % (ref 39.0–52.0)
Hemoglobin: 12.7 g/dL — ABNORMAL LOW (ref 13.0–17.0)
Immature Granulocytes: 5 %
Lymphocytes Relative: 9 %
Lymphs Abs: 1.1 10*3/uL (ref 0.7–4.0)
MCH: 30 pg (ref 26.0–34.0)
MCHC: 31.2 g/dL (ref 30.0–36.0)
MCV: 96 fL (ref 80.0–100.0)
Monocytes Absolute: 0.9 10*3/uL (ref 0.1–1.0)
Monocytes Relative: 7 %
Neutro Abs: 9.2 10*3/uL — ABNORMAL HIGH (ref 1.7–7.7)
Neutrophils Relative %: 76 %
Platelets: 395 10*3/uL (ref 150–400)
RBC: 4.24 MIL/uL (ref 4.22–5.81)
RDW: 14 % (ref 11.5–15.5)
WBC: 12 10*3/uL — ABNORMAL HIGH (ref 4.0–10.5)
nRBC: 0 % (ref 0.0–0.2)

## 2020-02-27 LAB — GLUCOSE, CAPILLARY
Glucose-Capillary: 122 mg/dL — ABNORMAL HIGH (ref 70–99)
Glucose-Capillary: 120 mg/dL — ABNORMAL HIGH (ref 70–99)
Glucose-Capillary: 131 mg/dL — ABNORMAL HIGH (ref 70–99)
Glucose-Capillary: 131 mg/dL — ABNORMAL HIGH (ref 70–99)
Glucose-Capillary: 90 mg/dL (ref 70–99)
Glucose-Capillary: 129 mg/dL — ABNORMAL HIGH (ref 70–99)

## 2020-02-27 LAB — BASIC METABOLIC PANEL
Anion gap: 8 (ref 5–15)
BUN: 27 mg/dL — ABNORMAL HIGH (ref 6–20)
CO2: 24 mmol/L (ref 22–32)
Calcium: 8.7 mg/dL — ABNORMAL LOW (ref 8.9–10.3)
Chloride: 108 mmol/L (ref 98–111)
Creatinine, Ser: 0.72 mg/dL (ref 0.61–1.24)
GFR, Estimated: >60 mL/min (ref >60)
Glucose, Bld: 162 mg/dL — ABNORMAL HIGH (ref 70–99)
Potassium: 4.5 mmol/L (ref 3.5–5.1)
Sodium: 140 mmol/L (ref 135–145)

## 2020-02-27 NOTE — Progress Notes (Signed)
Pt placed back on original vent setting of PRVC d/t tachypnea, pt getting tired on CPAP mode. RT will continue to monitor.

## 2020-02-27 NOTE — Progress Notes (Addendum)
Subjective: No significant changes.   Exam: Vitals:   02/27/20 0752 02/27/20 0800  BP:  122/67  Pulse:  63  Resp:  16  Temp:    SpO2: 100% 99%   Gen: In bed, intubated Resp: non-labored breathing, no acute distress Abd: soft, nt  Neuro: MS: Eyes  Open with noxious stimulation, does not follow commands.  CN: Pupils are reactive bilaterally, corneals are intact.  not look to the left or right. Motor: He withdraws all extremities to noxious stimulation Sensory: As above   Impression: 59 year old male with persistent encephalopathy after surgical excision of GBM.  There has been concern for seizures and therefore he is managed on phenobarbital and lacosamide. Per revious notes, awaitin gpath results for conversations with family.   Recommendations: 1) continue phenobarbital 90mg  BID 2) continue vimpat 100mg  Q12H 3) continue dexamethasone    Roland Rack, MD [facility] [phone]  If 7pm- 7am, please page neurology on call as listed in [place]. 02/27/2020  8:41 AM

## 2020-02-27 NOTE — Progress Notes (Signed)
Daughter called for update. Provided update. She would like to add Pt's sister to visitation list, Rolanda Lundborg.

## 2020-02-27 NOTE — Progress Notes (Signed)
Neurosurgery Service Progress Note  Subjective: No acute events overnight   Objective: Vitals:   02/27/20 0630 02/27/20 0645 02/27/20 0700 02/27/20 0800  BP: (!) 159/76 (!) 144/73 (S) (!) 84/50 122/67  Pulse: 82 77 73 63  Resp: 17 16 16 16   Temp:      TempSrc:      SpO2: 98% 96% 95% 99%  Weight:      Height:        Physical Exam: Eyes closed to stim, pupils pinpoint OU, gaze neutral, minimal withdrawal x4, incision c/d/i  Assessment & Plan: 59 y.o. man s/p tumor resection with post-op encephalopathy.  -f/u CCM recs -no change in neurosurgical plan of care  Antonio Glenn  02/27/20 8:26 AM

## 2020-02-27 NOTE — Progress Notes (Signed)
eLink Physician-Brief Progress Note Patient Name: Antonio Glenn DOB: 07-09-1960 MRN: [id number]   Date of Service  02/27/2020  HPI/Events of Note  Patient has been very difficult to keep synchronous on A/C mode of ventilation, even despite sedation. He requires sedation because he is otherwise agitated in a non-purposeful manner.   eICU Interventions  Several ventilator mode adjustments were trialed. By far the best was PSV 0/5. On this mode, patient was drawing VTs of 650-850cc ad breathing 10-11/min with minute ventilation 8-8.5 L/min. Ventilator settings changed to reflect this setting.      Intervention Category Major Interventions: Respiratory failure - evaluation and management  Marily Lente Pattrick Bady 02/27/2020, 12:13 AM

## 2020-02-27 NOTE — Progress Notes (Signed)
NAME:  Antonio Glenn, MRN:  [id number], DOB:  1960-11-01, LOS: 80 ADMISSION DATE:  02/11/2020, CONSULTATION DATE:  02/11/20 REFERRING MD:  Viona Gilmore  CHIEF COMPLAINT:  AMS / Seizures   Brief History   Antonio Glenn is a 59 y.o. male with hx of recently diagnosed brain tumor who was transferred from [place] ED to [facility] 11/25 with AMS and seizures.  He is scheduled for left craniotomy on Dec 1 with Dr. Marcello Moores.  Past Medical History  Brain Mass.  Significant Hospital Events   11/25 > admit.  12/1 -  CRANIOTOMY OF  LEFT PARIETAL TUMOR   12./3 - NSGY recommending c-EEG given "subtle left parahippocampal/hippocampal T2 hyperintensity suggestive of recent seizures (this signal was not apparent 3 weeks ago, so new tumor infiltration in this area would be less likely)"> they are reducing steroids. On fent gtt/versed gtt -> and still restless moving all 4s. Opens eyes to voice but does not follow commands. RN asking about starting TF and removing foley. Some days ago had hematuria after self dc of foley. RN planning for enema. RD here for cortrak  12/4 -  ongoing eeg. On vent. On fent gtt, versed gtt and diprivan gtt. Restless when diprivan lowered to < 20 and hypotensive when diprivan increased to > 53mg/min though sedated. CT chest pending. NO issues after foley removal  Consults:  Neurosurgery neuro  Procedures:  12/1: crainiotomy  Significant Diagnostic Tests:  CT head 11/25 > 6 - 7cm mass in left parietal lobe with areas of internal cystic necrosis.  Mild regional mass effect with no MLS.     MRI brain 12/2: Expected postoperative changes post gross total resection of enhancing component of left parietotemporal mass. Minimal enhancement at the resection cavity margins is probably postoperative but small volume residual tumor is difficult to exclude due to irregular appearance of the preoperative mass.  T2 hyperintensity along the left hippocampus and posteromedial left thalamus. May  be related to recent seizure activity, postsurgical, or rapid progression of infiltrating tumor.  CT Chest 12/4 - dilated ascending thoracic aorta to 4.7 cm  Bilateral pleural effusions with some adjacent areas of passive atelectatic change.     Micro Data:  Flu 11/25 > neg COVID 11/25 > neg Urine cx  12/4: neg resp cx 12/9: staph >>  Antimicrobials:   cefazolin 12/1 - 12/2 vanc 11/29 - 12/2, 12/9>>   Interim history/subjective:  12/10 severe agitation increase propofol drip and tapered Versed to off  12/9: remains encephalopathic. Cont on oral meds, neuro considering increased dose of phenobarb. Did tolerate some vent wean yesterday. Will hopefully be able to move to extubation trial in next 24-48 hours. Family meeting today to discuss pt's wishes should he fail. Unfortunately still awaiting pathology. Copious tan secretions, tmax 100.3. will culture but hold on abx for now.  12/8: agreed pt will warrant extubation trial but prefer to do it more controlled than simply stopping sedation without sbt etc and extubating. We have added anti-htn meds as when sedation weaned bp spikes >200. We have also scheduled valium/oxy and clonidine in order to attempt to wean iv sedation. Previously precedex was unsuccessful and I nor family want to extubate pt only to need 5 point restraints in an awake but not alert pt. Extensive conversation had with daughter today via phone re: plan of oral to wean iv sedation and attempts at even brief sbt prior to extubation. However I discussed that since we will not be able to do  typical assessment that while we expect he can maintain his airway we do not know what his mental status otherwise will be. I asked her to consider the potential need for reintubation +/- trach and she appropriately is hopeful that the pathology will be back before she and her family have to make that decision. I have offered to meet with her and any other family/friends that she feels  would be beneficial and advocates for the patient later today or tomorrow to discuss this.  12/7: will attempt sbt today despite sedation and hopefully plan for extubation soon. Still not following commands. Will d/w family prior to extubation. Airway noted to be mallampati II, grade I view with mac 4. htn this am with sbp >190, starting PO agent as unable to wean sedation at this time with already this degree of hypertension. Too sedate this am for sbt  12/6: increased decadron yesterday. Continues with sharp waves in L temporal region on cEEG but no frank sz. Ammonia normal. Not following commands this am. Kicking legs oob and feet pulling on foley. 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 c-EEG ongoing - > This studyshowed evidence of epileptogenicity arising from left temporalregion. There is also severe diffuse encephalopathy, non specific etiology but likely secondary to sedation. On vent. Gets agitated easy. On Diprivan gtt, versed gtt, fentgtt. 40% fio2 on vent   12/11 critically ill, intubated Remains with intermittent agitation Hypotensive overnight requiring Levophed drip, now on 50 mics of propofol and 200 mics of fentanyl  Objective:  Blood pressure 119/68, pulse 71, temperature 99.1 F (37.3 C), temperature source Oral, resp. rate 16, height _0  (1.905 m), weight (!) 138.1 kg, SpO2 96 %.    Vent Mode: PRVC FiO2 (%):  [40 %] 40 % Set Rate:  [16 bmp] 16 bmp Vt Set:  [670 mL] 670 mL PEEP:  [5 cmH20] 5 cmH20 Plateau Pressure:  [8 TGY56-38 cmH20] 19 cmH20   Intake/Output Summary (Last 24 hours) at 02/27/2020 1043 Last data filed at 02/27/2020 1000 Gross per 24 hour  Intake 4020.04 ml  Output 2925 ml  Net 1095.04 ml   Filed Weights   02/25/20 0500 02/26/20 0420 02/27/20 0500  Weight: (!) 143.4 kg (!) 143.4 kg (!) 138.1 kg     General Appearance-obese man, flailing around in bed, mild distress Head:  Normocephalic, without  obvious abnormality, atraumatic Eyes:  PERRL, conjunctiva injected   Ears:  Normal external ear canals, both ears Throat: ett in place Neck:  Supple,  No enlargement/tenderness/nodules Lungs: Bilateral ventilated breath sounds, no accessory muscle use Heart:  S1 and S2 normal, no murmur Abdomen:  Obese, Soft, no masses, no organomegaly Extremities:  Extremities 5/5 in all 4, no c/c/e Skin:  intact in exposed areas .  Neurologic: Sedated, RASS -4, pupils reactive   Labs show normal electrolytes, decrease leukocytosis, no anemia.  Chest x-ray independently reviewed 12/9 shows low lung volumes, basilar atelectasis, wide mediastinum  Resolved problems  Recurrent Hematuria when foley dc'ed - last noted 02/20/20 - 2nd episode. Suspect traumatic   Assessment & Plan:  Acute Resp failure due to brain mass and acute encephalopathy Plan Attempt spontaneous breathing trials if we can get him to be awake and calm At this time poor mental status is the main barrier to extubation Per family discussion 12/9, no reintubation and DNR issued  Hypotension -related to meds especially propofol higher doses -DC clonidine -Taper Levophed to goal MAP 65  Acute encephalopathy/hyperactive delirium due to left parietal tumor, requiring titration of IV sedative infusions -ammonia negative -LTM stopped 12/6 -neuro guiding AED -Using phenobarbital, Valium, oxycodone to try to wean IV sedation -Monitor QT interval intermittently while on Seroquel -Versed drip weaned off, use propofol as needed RASS goal 0 to -1  Seizure episode 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 Plan -Vimpat per neuro -LTM d/c'd 12/6 - dexamethasone per neurosx   Staph HAP -continue vancomycin, simplify once organism identified   PAF - A Fib hx plan -Holding full dose anticoagulation due to recent brain surgery - timing restart per NSGY    Thoracic Aorta Aneurysm - new 02/20/20 Plan  - repeat CT chest in 6  months    Daily Goals Checklist  Pain/Anxiety/Delirium protocol (if indicated): see mar VAP protocol (if indicated): yes Blood pressure target: Keep systolic blood pressure less than 170 DVT prophylaxis: lovenox Nutrition Status: TF  GI prophylaxis protonix Urinary catheter: foley removed 12/9 Central lines: picc Glucose control: ssi Mobility/therapy needs: Bedrest, continues to require restraints for patient safety Code Status: Full Family Communication: Goals of care discussion 12/9-DNR issued, no tracheostomy, no reintubation await final pathology and give him few more days to see if we can reach the point of successful extubation , daughter [name] updated 12/11 Disposition: ICU      LABS    PULMONARY No results for input(s): PHART, PCO2ART, PO2ART, HCO3, TCO2, O2SAT in the last 168 hours.  Invalid input(s): PCO2, PO2  CBC Recent Labs  Lab 02/25/20 0434 02/26/20 0413 02/27/20 0450  HGB 11.6* 12.9* 12.7*  HCT 36.9* 40.3 40.7  WBC 12.8* 13.1* 12.0*  PLT 326 374 395    COAGULATION No results for input(s): INR in the last 168 hours.  CARDIAC  No results for input(s): TROPONINI in the last 168 hours. No results for input(s): PROBNP in the last 168 hours.   CHEMISTRY Recent Labs  Lab 02/21/20 0540 02/22/20 0538 02/22/20 0538 02/23/20 0349 02/23/20 0749 02/24/20 0415 02/25/20 0434 02/26/20 0413 02/27/20 0450  NA  --  138  --   --  139  --   --  139 140  K  --  4.0   < >  --  4.4  --   --  4.2 4.5  CL  --  106  --   --  106  --   --  104 108  CO2  --  25  --   --  23  --   --  22 24  GLUCOSE  --  133*  --   --  134*  --   --  109* 162*  BUN  --  20  --   --  16  --   --  27* 27*  CREATININE  --  0.54*  --   --  0.58*  --   --  0.69 0.72  CALCIUM  --  8.2*  --   --  8.3*  --   --  8.2* 8.7*  MG  --  2.0  --   --   --   --   --  2.2  --   PHOS 2.5 3.2  --  4.0  --  4.5 3.9  --   --    < > = values in this interval not displayed.   Estimated  Creatinine Clearance: 147.1 mL/min (by C-G formula based on SCr of 0.72 mg/dL).   LIVER Recent Labs  Lab 02/22/20 0538 02/23/20  0749  AST 18 25  ALT 46* 45*  ALKPHOS 58 66  BILITOT 0.8 0.4  PROT 4.9* 5.0*  ALBUMIN 2.2* 2.3*     INFECTIOUS No results for input(s): LATICACIDVEN, PROCALCITON in the last 168 hours.   ENDOCRINE CBG (last 3)  Recent Labs    02/26/20 2339 02/27/20 0313 02/27/20 0823  GLUCAP 130* 122* 120*    The patient is critically ill with multiple organ systems failure and requires high complexity decision making for assessment and support, frequent evaluation and titration of therapies, application of advanced monitoring technologies and extensive interpretation of multiple databases. Critical Care Time devoted to patient care services described in this note independent of APP/resident  time is 32 minutes.    Kara Mead MD. Shade Flood. [facility] See Amion for pager  If no response to pager , please call [phone]  After 7:00 pm call Elink  [phone]    02/27/2020, 10:43 AM

## 2020-02-27 NOTE — Progress Notes (Signed)
RT placed pt in CPAP w/out PS on vent per MD Stretch. Pts current settings are PS 0, PEEP 5, 40%. Pt tolerating at this time. RT will continue to monitor.

## 2020-02-27 NOTE — Plan of Care (Signed)
Patient remains restless and on sedation.  Blood pressure labile requiring intermittent Levophed, vent changes made with more patient compliance  Problem: Education: Goal: Knowledge of General Education information will improve Description: Including pain rating scale, medication(s)/side effects and non-pharmacologic comfort measures Outcome: Not Progressing   Problem: Clinical Measurements: Goal: Ability to maintain clinical measurements within normal limits will improve Outcome: Not Progressing Goal: Will remain free from infection Outcome: Not Progressing Goal: Diagnostic test results will improve Outcome: Not Progressing Goal: Respiratory complications will improve Outcome: Not Progressing Goal: Cardiovascular complication will be avoided Outcome: Progressing

## 2020-02-28 DIAGNOSIS — G9340 Encephalopathy, unspecified: Secondary | ICD-10-CM | POA: Diagnosis not present

## 2020-02-28 DIAGNOSIS — Y95 Nosocomial condition: Secondary | ICD-10-CM | POA: Diagnosis not present

## 2020-02-28 DIAGNOSIS — J9601 Acute respiratory failure with hypoxia: Secondary | ICD-10-CM | POA: Diagnosis not present

## 2020-02-28 DIAGNOSIS — J189 Pneumonia, unspecified organism: Secondary | ICD-10-CM | POA: Diagnosis not present

## 2020-02-28 LAB — BASIC METABOLIC PANEL
Anion gap: 8 (ref 5–15)
BUN: 20 mg/dL (ref 6–20)
CO2: 22 mmol/L (ref 22–32)
Calcium: 7.7 mg/dL — ABNORMAL LOW (ref 8.9–10.3)
Chloride: 103 mmol/L (ref 98–111)
Creatinine, Ser: 0.47 mg/dL — ABNORMAL LOW (ref 0.61–1.24)
GFR, Estimated: >60 mL/min (ref >60)
Glucose, Bld: 126 mg/dL — ABNORMAL HIGH (ref 70–99)
Potassium: 4.3 mmol/L (ref 3.5–5.1)
Sodium: 133 mmol/L — ABNORMAL LOW (ref 135–145)

## 2020-02-28 LAB — CBC WITH DIFFERENTIAL/PLATELET
Abs Immature Granulocytes: 0.33 10*3/uL — ABNORMAL HIGH (ref 0.00–0.07)
Basophils Absolute: 0.1 10*3/uL (ref 0.0–0.1)
Basophils Relative: 1 %
Eosinophils Absolute: 0.4 10*3/uL (ref 0.0–0.5)
Eosinophils Relative: 4 %
HCT: 34 % — ABNORMAL LOW (ref 39.0–52.0)
Hemoglobin: 11.5 g/dL — ABNORMAL LOW (ref 13.0–17.0)
Immature Granulocytes: 3 %
Lymphocytes Relative: 12 %
Lymphs Abs: 1.2 10*3/uL (ref 0.7–4.0)
MCH: 32.5 pg (ref 26.0–34.0)
MCHC: 33.8 g/dL (ref 30.0–36.0)
MCV: 96 fL (ref 80.0–100.0)
Monocytes Absolute: 0.9 10*3/uL (ref 0.1–1.0)
Monocytes Relative: 9 %
Neutro Abs: 7.2 10*3/uL (ref 1.7–7.7)
Neutrophils Relative %: 71 %
Platelets: 384 10*3/uL (ref 150–400)
RBC: 3.54 MIL/uL — ABNORMAL LOW (ref 4.22–5.81)
RDW: 14 % (ref 11.5–15.5)
WBC: 10 10*3/uL (ref 4.0–10.5)
nRBC: 0 % (ref 0.0–0.2)

## 2020-02-28 LAB — GLUCOSE, CAPILLARY
Glucose-Capillary: 116 mg/dL — ABNORMAL HIGH (ref 70–99)
Glucose-Capillary: 110 mg/dL — ABNORMAL HIGH (ref 70–99)
Glucose-Capillary: 95 mg/dL (ref 70–99)
Glucose-Capillary: 110 mg/dL — ABNORMAL HIGH (ref 70–99)
Glucose-Capillary: 84 mg/dL (ref 70–99)
Glucose-Capillary: 101 mg/dL — ABNORMAL HIGH (ref 70–99)

## 2020-02-28 LAB — CULTURE, RESPIRATORY W GRAM STAIN: Gram Stain: NONE SEEN

## 2020-02-28 LAB — VANCOMYCIN, TROUGH
Vancomycin Tr: 4 ug/mL — ABNORMAL LOW (ref 15–20)
Vancomycin Tr: 11 ug/mL — ABNORMAL LOW (ref 15–20)

## 2020-02-28 MED ORDER — MIDODRINE HCL 5 MG PO TABS
10.0000 mg | ORAL_TABLET | Freq: Two times a day (BID) | ORAL | Status: DC
  Administered 2020-02-28 (×2): 10 mg
  Filled 2020-02-28 (×2): qty 2

## 2020-02-28 MED ORDER — VANCOMYCIN HCL 1750 MG/350ML IV SOLN
1750.0000 mg | Freq: Three times a day (TID) | INTRAVENOUS | Status: AC
  Administered 2020-02-29 – 2020-03-02 (×9): 1750 mg via INTRAVENOUS
  Filled 2020-02-28 (×9): qty 350

## 2020-02-28 NOTE — Progress Notes (Signed)
NAME:  Antonio Glenn, MRN:  [id number], DOB:  [birth year], LOS: 17 ADMISSION DATE:  02/11/2020, CONSULTATION DATE:  02/11/20 REFERRING MD:  Viona Gilmore  CHIEF COMPLAINT:  AMS / Seizures   Brief History   Antonio Glenn is a 59 y.o. male with hx of recently diagnosed brain tumor who was transferred from [place] ED to [facility] 11/25 with AMS and seizures.  He is scheduled for left craniotomy on Dec 1 with Dr. Marcello Moores.  Past Medical History  Brain Mass.  Significant Hospital Events   11/25 > admit.  12/1 -  CRANIOTOMY OF  LEFT PARIETAL TUMOR   12./3 - NSGY recommending c-EEG given "subtle left parahippocampal/hippocampal T2 hyperintensity suggestive of recent seizures (this signal was not apparent 3 weeks ago, so new tumor infiltration in this area would be less likely)"> they are reducing steroids. On fent gtt/versed gtt -> and still restless moving all 4s. Opens eyes to voice but does not follow commands. RN asking about starting TF and removing foley. Some days ago had hematuria after self dc of foley. RN planning for enema. RD here for cortrak  12/4 -  ongoing eeg. On vent. On fent gtt, versed gtt and diprivan gtt. Restless when diprivan lowered to < 20 and hypotensive when diprivan increased to > 23mg/min though sedated. CT chest pending. 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 c-EEG ongoing - > This studyshowed evidence of epileptogenicity arising from left temporalregion. There is also severe diffuse encephalopathy, non specific etiology but likely secondary to sedation. On vent. Gets agitated easy. On Diprivan gtt, versed gtt, fentgtt. 40% fio2 on vent  12/6: increased decadron yesterday. Continues with sharp waves in L temporal region on cEEG but no frank sz. Ammonia normal. Not following commands this am. Kicking legs oob and feet pulling on foley.   12/7:  Still not following commands.. Airway noted to be mallampati II, grade I view with  mac 4. htn this am with sbp >190, starting PO agent as unable to wean sedation at this time with already this degree of hypertension. Too sedate this am for sbt  12/8: agreed pt will warrant extubation trial but prefer to do it more controlled than simply stopping sedation without sbt etc and extubating. We have added anti-htn meds as when sedation weaned bp spikes >200. We have also scheduled valium/oxy and clonidine in order to attempt to wean iv sedation. Previously precedex was unsuccessful and I nor family want to extubate pt only to need 5 point restraints in an awake but not alert pt. Extensive conversation had with daughter today via phone re: plan of oral to wean iv sedation and attempts at even brief sbt prior to extubation. However I discussed that since we will not be able to do typical assessment that while we expect he can maintain his airway we do not know what his mental status otherwise will be. I asked her to consider the potential need for reintubation +/- trach and she appropriately is hopeful that the pathology will be back before she and her family have to make that decision.  12/9:  Family meeting today to discuss pt's wishes should he fail. Unfortunately still awaiting pathology. Copious tan secretions, tmax 100.3.   12/10 severe agitation increase propofol drip and tapered Versed to off  12/11 Hypotensive overnight requiring Levophed drip, now on 50 mics of propofol and 200 mics of fentanyl  Consults:  Neurosurgery neuro  Procedures:  12/1: crainiotomy  Significant Diagnostic Tests:  CT head 11/25 > 6 - 7cm mass in left parietal lobe with areas of internal cystic necrosis.  Mild regional mass effect with no MLS.     MRI brain 12/2: Expected postoperative changes post gross total resection of enhancing component of left parietotemporal mass. Minimal enhancement at the resection cavity margins is probably postoperative but small volume residual tumor is difficult  to exclude due to irregular appearance of the preoperative mass.  T2 hyperintensity along the left hippocampus and posteromedial left thalamus. May be related to recent seizure activity, postsurgical, or rapid progression of infiltrating tumor.  CT Chest 12/4 - dilated ascending thoracic aorta to 4.7 cm  Bilateral pleural effusions with some adjacent areas of passive atelectatic change.     Micro Data:  Flu 11/25 > neg COVID 11/25 > neg Urine cx  12/4: neg resp cx 12/9: MRSA  Antimicrobials:   cefazolin 12/1 - 12/2 vanc 11/29 - 12/2, 12/9>>   Interim history/subjective:   Critically ill, intubated Remains on high-dose propofol, fentanyl 200 mics, low-dose Levophed. Either very agitated or completely sedated when propofol lowered  Objective:  Blood pressure (!) 187/102, pulse 93, temperature 98 F (36.7 C), temperature source Axillary, resp. rate (!) 24, height _0  (1.905 m), weight (!) 138.1 kg, SpO2 97 %.    Vent Mode: PRVC FiO2 (%):  [40 %] 40 % Set Rate:  [16 bmp] 16 bmp Vt Set:  [600 mL-670 mL] 600 mL PEEP:  [5 cmH20] 5 cmH20 Plateau Pressure:  [16 cmH20-19 cmH20] 19 cmH20   Intake/Output Summary (Last 24 hours) at 02/28/2020 0943 Last data filed at 02/28/2020 0900 Gross per 24 hour  Intake 4321.66 ml  Output 2800 ml  Net 1521.66 ml   Filed Weights   02/25/20 0500 02/26/20 0420 02/27/20 0500  Weight: (!) 143.4 kg (!) 143.4 kg (!) 138.1 kg     General Appearance-obese man, flailing around in bed, mild distress Head:  Normocephalic, without obvious abnormality, atraumatic Eyes:  PERRL, conjunctiva injected   Throat: ett in place Neck:  Supple,  No enlargement/tenderness/nodules Lungs: Bilateral ventilated breath sounds, no accessory muscle use, normal secretions Heart:  S1 and S2 normal, no murmur Abdomen:  Obese, Soft, no masses, no organomegaly Extremities:  Extremities 5/5 in all 4, no c/c/e Skin:  intact in exposed areas .  Neurologic: RASS  -4, sedated, pupils bilaterally equally reactive to light   Labs show mild hyponatremia, no leukocytosis, stable anemia  Chest x-ray independently reviewed 12/9 left basal opacity, low lung volumes, wide mediastinum  Resolved problems  Recurrent Hematuria when foley dc'ed - last noted 02/20/20 - 2nd episode. Suspect traumatic   Assessment & Plan:  Main issue here is his persistent encephalopathy and severe agitation which is difficult to control without high-dose sedation for which he requires an airway   Acute encephalopathy/hyperactive delirium due to left parietal tumor, possible aphasia and seizures not picked up on surface EEG -ammonia negative -LTM stopped 12/6 -neuro guiding AED -Using phenobarbital, Valium, oxycodone to try to wean IV sedation -Monitor QT interval intermittently while on Seroquel -Using propofol since Precedex did not work, too sedated on 50 mics and agitated when as low as 20 mics RASS goal 0 to -1 -this has been a difficult target  Acute Resp failure due to brain mass and acute encephalopathy Plan Tolerates spontaneous breathing trials At this time poor mental status is the main barrier to extubation Per family discussion 12/9, no reintubation and  DNR issued  Hypotension -related to meds especially propofol higher doses -DC clonidine -Taper Levophed to goal MAP 65 -Start midodrine 10 twice daily    Seizure episode 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  He tolerated spontaneous breathing trials but concern here  is about the need for high-dose sedation to control his agitated delirium without any airway.  Daughter is still hopeful for a good outcome  Daily Goals Checklist  Pain/Anxiety/Delirium protocol (if indicated): see mar VAP protocol (if indicated): yes Blood pressure target: Keep systolic blood pressure less than 170 DVT prophylaxis: lovenox Nutrition Status: TF  GI prophylaxis protonix Urinary catheter: foley removed 12/9 Central lines: picc Glucose control: ssi Mobility/therapy needs: Bedrest, continues to require restraints for patient safety Code Status: Full Family Communication:  daughter Alyson Ingles updated 12/11 Disposition: ICU      LABS    PULMONARY No results for input(s): PHART, PCO2ART, PO2ART, HCO3, TCO2, O2SAT in the last 168 hours.  Invalid input(s): PCO2, PO2  CBC Recent Labs  Lab 02/26/20 0413 02/27/20 0450 02/28/20 0441  HGB 12.9* 12.7* 11.5*  HCT 40.3 40.7 34.0*  WBC 13.1* 12.0* 10.0  PLT 374 395 384    COAGULATION No results for input(s): INR in the last 168 hours.  CARDIAC  No results for input(s): TROPONINI in the last 168 hours. No results for input(s): PROBNP in the last 168 hours.   CHEMISTRY Recent Labs  Lab 02/22/20 0538 02/23/20 0349 02/23/20 0749 02/24/20 0415 02/25/20 0434 02/26/20 0413 02/27/20 0450 02/28/20 0441  NA 138  --  139  --   --  139 140 133*  K 4.0  --  4.4  --   --  4.2 4.5 4.3  CL 106  --  106  --   --  104 108 103  CO2 25  --  23  --   --  _0 GLUCOSE 133*  --  134*  --   --  109* 162* 126*  BUN 20  --  16  --   --  27* 27* 20  CREATININE 0.54*  --  0.58*  --   --  0.69 0.72 0.47*  CALCIUM 8.2*  --  8.3*  --   --  8.2* 8.7* 7.7*  MG 2.0  --   --   --   --  2.2  --   --   PHOS 3.2 4.0  --  4.5 3.9  --   --   --    Estimated Creatinine Clearance: 147.1 mL/min (A) (by C-G formula based on SCr of 0.47 mg/dL (L)).   LIVER Recent Labs  Lab 02/22/20 0538 02/23/20 0749  AST 18 25  ALT 46* 45*   ALKPHOS 58 66  BILITOT 0.8 0.4  PROT 4.9* 5.0*  ALBUMIN 2.2* 2.3*     INFECTIOUS No results for input(s): LATICACIDVEN, PROCALCITON in the last 168 hours.   ENDOCRINE CBG (last 3)  Recent Labs    02/27/20 2304 02/28/20 0322 02/28/20 0728  GLUCAP 129* 116* 110*  The patient is critically ill with multiple organ systems failure and requires high complexity decision making for assessment and support, frequent evaluation and titration of therapies, application of advanced monitoring technologies and extensive interpretation of multiple databases. Critical Care Time devoted to patient care services described in this note independent of APP/resident  time is 32 minutes.     Kara Mead MD. Shade Flood. [facility] See Amion for pager  If no response to pager , please call [phone]  After 7:00 pm call Elink  [phone]    02/28/2020, 9:43 AM

## 2020-02-28 NOTE — Progress Notes (Signed)
Pharmacy Antibiotic Note  Antonio Glenn is a 59 y.o. male admitted on 02/11/2020 with mental status changes and seizures, now with MRSA pneumonia. Pharmacy has been consulted for Vnacomycin dosing. Afebrile, WBC trend down to WNL. SCr stable 0.47, good UOP.  A vanc trough this evening was subtherapeutic at 11.   Plan: Increase vancomycin to 1750 mg IV Q 8 hours  Monitor clinical progress, c/s, renal function Stop date in for 12/15 per CCM   Height: 6\' 3"  (190.5 cm) Weight: (!) 138.1 kg (304 lb 7.3 oz) IBW/kg (Calculated) : 84.5  Temp (24hrs), Avg:98.8 F (37.1 C), Min:98 F (36.7 C), Max:99.6 F (37.6 C)  Recent Labs  Lab 02/22/20 0538 02/23/20 0349 02/23/20 0749 02/24/20 0415 02/25/20 0434 02/26/20 0413 02/27/20 0450 02/28/20 0441 02/28/20 0514 02/28/20 1639  WBC 7.9   < >  --  12.9* 12.8* 13.1* 12.0* 10.0  --   --   CREATININE 0.54*  --  0.58*  --   --  0.69 0.72 0.47*  --   --   VANCOTROUGH  --   --   --   --   --   --   --   --  4* 11*   < > = values in this interval not displayed.    Estimated Creatinine Clearance: 147.1 mL/min (A) (by C-G formula based on SCr of 0.47 mg/dL (L)).    No Known Allergies   Albertina Parr, PharmD., BCPS, BCCCP Clinical Pharmacist Please refer to [facility] for unit-specific pharmacist

## 2020-02-28 NOTE — Progress Notes (Signed)
Neurosurgery Service Progress Note  Subjective: No acute events overnight   Objective: Vitals:   02/28/20 1100 02/28/20 1125 02/28/20 1200 02/28/20 1300  BP: (!) 164/90  140/89 (!) 171/111  Pulse: 99 96 88 96  Resp: 11 13 (!) 8 14  Temp:      TempSrc:      SpO2: 94% 97% 97% 99%  Weight:      Height:        Physical Exam: Raises eyebrows but doesn't fully open eyes to voice c/w eyelid apraxia, pupils pinpoint OU, gaze neutral, minimal withdrawal x4, incision c/d/i, not FC  Assessment & Plan: 59 y.o. man s/p tumor resection with post-op encephalopathy.  -f/u CCM recs -f/u neurology recs, on phenobarb, lacosamide, dex -no change in neurosurgical plan of care  Judith Part  02/28/20 1:54 PM

## 2020-02-28 NOTE — Progress Notes (Signed)
Pharmacy Antibiotic Note  Antonio Glenn is a 59 y.o. male admitted on 02/11/2020 with mental status changes and seizures, now with MRSA pneumonia. Pharmacy has been consulted for Vnacomycin dosing. Afebrile, WBC trend down to WNL. SCr stable 0.47, good UOP.  Vancomycin trough ordered prior to dose this AM finally resulted low at 4 - lab reports there may have been an issue with the sample. Will recheck prior to tonight's dose to confirm.  Plan: Continue vancomycin 2gm IV q12h for now Recheck VT at 1730 today and adjust dose as needed Monitor clinical progress, c/s, renal function Stop date in for 12/15 per CCM   Height: 6\' 3"  (190.5 cm) Weight: (!) 138.1 kg (304 lb 7.3 oz) IBW/kg (Calculated) : 84.5  Temp (24hrs), Avg:98.7 F (37.1 C), Min:98 F (36.7 C), Max:99 F (37.2 C)  Recent Labs  Lab 02/22/20 0538 02/23/20 0349 02/23/20 0749 02/24/20 0415 02/25/20 0434 02/26/20 0413 02/27/20 0450 02/28/20 0441 02/28/20 0514  WBC 7.9   < >  --  12.9* 12.8* 13.1* 12.0* 10.0  --   CREATININE 0.54*  --  0.58*  --   --  0.69 0.72 0.47*  --   VANCOTROUGH  --   --   --   --   --   --   --   --  4*   < > = values in this interval not displayed.    Estimated Creatinine Clearance: 147.1 mL/min (A) (by C-G formula based on SCr of 0.47 mg/dL (L)).    No Known Allergies   Arturo Morton, PharmD, BCPS Please check AMION for all [place] contact numbers Clinical Pharmacist 02/28/2020 9:49 AM

## 2020-02-29 ENCOUNTER — Inpatient Hospital Stay (HOSPITAL_COMMUNITY): Payer: BC Managed Care – PPO

## 2020-02-29 DIAGNOSIS — G9389 Other specified disorders of brain: Secondary | ICD-10-CM | POA: Diagnosis not present

## 2020-02-29 DIAGNOSIS — G9340 Encephalopathy, unspecified: Secondary | ICD-10-CM | POA: Diagnosis not present

## 2020-02-29 DIAGNOSIS — I4891 Unspecified atrial fibrillation: Secondary | ICD-10-CM | POA: Diagnosis not present

## 2020-02-29 DIAGNOSIS — J189 Pneumonia, unspecified organism: Secondary | ICD-10-CM | POA: Diagnosis not present

## 2020-02-29 LAB — CBC WITH DIFFERENTIAL/PLATELET
Abs Immature Granulocytes: 0.19 10*3/uL — ABNORMAL HIGH (ref 0.00–0.07)
Basophils Absolute: 0.1 10*3/uL (ref 0.0–0.1)
Basophils Relative: 1 %
Eosinophils Absolute: 0.4 10*3/uL (ref 0.0–0.5)
Eosinophils Relative: 4 %
HCT: 39 % (ref 39.0–52.0)
Hemoglobin: 12.3 g/dL — ABNORMAL LOW (ref 13.0–17.0)
Immature Granulocytes: 2 %
Lymphocytes Relative: 13 %
Lymphs Abs: 1.4 10*3/uL (ref 0.7–4.0)
MCH: 30.1 pg (ref 26.0–34.0)
MCHC: 31.5 g/dL (ref 30.0–36.0)
MCV: 95.4 fL (ref 80.0–100.0)
Monocytes Absolute: 0.9 10*3/uL (ref 0.1–1.0)
Monocytes Relative: 9 %
Neutro Abs: 7.5 10*3/uL (ref 1.7–7.7)
Neutrophils Relative %: 71 %
Platelets: 370 10*3/uL (ref 150–400)
RBC: 4.09 MIL/uL — ABNORMAL LOW (ref 4.22–5.81)
RDW: 13.7 % (ref 11.5–15.5)
WBC: 10.4 10*3/uL (ref 4.0–10.5)
nRBC: 0 % (ref 0.0–0.2)

## 2020-02-29 LAB — GLUCOSE, CAPILLARY
Glucose-Capillary: 92 mg/dL (ref 70–99)
Glucose-Capillary: 88 mg/dL (ref 70–99)
Glucose-Capillary: 96 mg/dL (ref 70–99)
Glucose-Capillary: 95 mg/dL (ref 70–99)
Glucose-Capillary: 84 mg/dL (ref 70–99)
Glucose-Capillary: 118 mg/dL — ABNORMAL HIGH (ref 70–99)

## 2020-02-29 LAB — PHOSPHORUS: Phosphorus: 3.3 mg/dL (ref 2.5–4.6)

## 2020-02-29 LAB — BASIC METABOLIC PANEL
Anion gap: 9 (ref 5–15)
BUN: 16 mg/dL (ref 6–20)
CO2: 25 mmol/L (ref 22–32)
Calcium: 8.4 mg/dL — ABNORMAL LOW (ref 8.9–10.3)
Chloride: 103 mmol/L (ref 98–111)
Creatinine, Ser: 0.53 mg/dL — ABNORMAL LOW (ref 0.61–1.24)
GFR, Estimated: >60 mL/min (ref >60)
Glucose, Bld: 97 mg/dL (ref 70–99)
Potassium: 3.8 mmol/L (ref 3.5–5.1)
Sodium: 137 mmol/L (ref 135–145)

## 2020-02-29 LAB — MAGNESIUM: Magnesium: 2.2 mg/dL (ref 1.7–2.4)

## 2020-02-29 LAB — TRIGLYCERIDES: Triglycerides: 115 mg/dL (ref <150)

## 2020-02-29 MED ORDER — BETHANECHOL CHLORIDE 10 MG PO TABS
10.0000 mg | ORAL_TABLET | Freq: Three times a day (TID) | ORAL | Status: DC
  Administered 2020-02-29 – 2020-03-26 (×79): 10 mg
  Filled 2020-02-29 (×79): qty 1

## 2020-02-29 MED ORDER — DEXTROSE 10 % IV SOLN: INTRAVENOUS | Status: DC

## 2020-02-29 MED ORDER — METOPROLOL TARTRATE 25 MG/10 ML ORAL SUSPENSION
25.0000 mg | Freq: Two times a day (BID) | ORAL | Status: DC
  Administered 2020-02-29 – 2020-03-01 (×3): 25 mg
  Filled 2020-02-29 (×4): qty 10

## 2020-02-29 MED ORDER — DEXAMETHASONE SODIUM PHOSPHATE 4 MG/ML IJ SOLN
1.0000 mg | Freq: Two times a day (BID) | INTRAMUSCULAR | Status: DC
  Administered 2020-02-29 – 2020-03-03 (×7): 1 mg via INTRAVENOUS
  Filled 2020-02-29 (×7): qty 1

## 2020-02-29 NOTE — Progress Notes (Signed)
eLink Physician-Brief Progress Note Patient Name: Antonio Glenn DOB: January 29, 1961 MRN: [id number]   Date of Service  02/29/2020  HPI/Events of Note  Nursing reports tube feeds turned off d/t emesis. Last 2 blood glucoses = 84 and 101. Appears to Be on Q 4 hour moderate Novolog SSI  eICU Interventions  Plan: 1. D10W to run IV at 20 mL/hour.      Intervention Category Major Interventions: Other:  Sheria Rosello Cornelia Copa 02/29/2020, 1:15 AM

## 2020-02-29 NOTE — Progress Notes (Signed)
Subjective: NAEs o/n  Objective: Vital signs in last 24 hours: Temp:  [98.2 F (36.8 C)-99.6 F (37.6 C)] 99 F (37.2 C) (12/13 0800) Pulse Rate:  [70-105] 79 (12/13 0803) Resp:  [8-24] 18 (12/13 0803) BP: (87-187)/(59-111) 145/102 (12/13 0800) SpO2:  [93 %-100 %] 98 % (12/13 0803) FiO2 (%):  [40 %] 40 % (12/13 0804) Weight:  [140.5 kg] 140.5 kg (12/13 0500)  Intake/Output from previous day: 12/12 0701 - 12/13 0700 In: 3709 [I.V.:1687.7; NG/GT:1154.2; IV Piggyback:867.1] Out: 8088 [Urine:3565] Intake/Output this shift: Total I/O In: 0  Out: 275 [Urine:275] Intubated PERRL Eyes open to voice, regards, MAEs purposefully though restrained.  Lab Results: Recent Labs    02/28/20 0441 02/29/20 0543  WBC 10.0 10.4  HGB 11.5* 12.3*  HCT 34.0* 39.0  PLT 384 370   BMET Recent Labs    02/28/20 0441 02/29/20 0543  NA 133* 137  K 4.3 3.8  CL 103 103  CO2 22 25  GLUCOSE 126* 97  BUN 20 16  CREATININE 0.47* 0.53*  CALCIUM 7.7* 8.4*    Studies/Results: DG Chest Port 1 View  Result Date: 02/29/2020 CLINICAL DATA:  Acute respiratory failure EXAM: PORTABLE CHEST 1 VIEW COMPARISON:  02/25/2020 FINDINGS: Endotracheal tube tip is 6.5 cm above the base of the carina. Feeding tube tip appears to be in the distal esophagus. Low lung volumes with bibasilar atelectasis. No pleural effusion. The visualized bony structures of the thorax show no acute abnormality. Telemetry leads overlie the chest. IMPRESSION: 1. Low volume film with streaky basilar opacity suggesting atelectasis. 2. Feeding tube tip appears to be in the distal esophagus. These results will be called to the ordering clinician or representative by the Radiologist Assistant, and communication documented in the PACS or [employer]. Electronically Signed   By: Misty Stanley M.D.   On: 02/29/2020 07:59    Assessment/Plan: S/p resection of left parietal lobe tumor - will need extubation trial - pathology results will  likely be back today - per our discussion at interdisciplinary tumor board, will consult palliative care to reaffirm goals of care discussion  [name] 02/29/2020, 8:45 AM

## 2020-02-29 NOTE — Progress Notes (Signed)
NAME:  Antonio Glenn, MRN:  [id number], DOB:  21-Feb-1961, LOS: 61 ADMISSION DATE:  02/11/2020, CONSULTATION DATE:  02/11/20 REFERRING MD:  Antonio Glenn  CHIEF COMPLAINT:  AMS / Seizures   Brief History   Antonio Glenn is a 59 y.o. male with hx of recently diagnosed brain tumor who was transferred from [facility] to [facility] 11/25 with AMS and seizures.  Had left craniotomy on Dec 1 with Dr. Marcello Glenn.  Past Medical History  Brain Mass.  Significant Hospital Events   11/25 > admit.  12/1 -  CRANIOTOMY OF  LEFT PARIETAL TUMOR   12./3 - NSGY recommending c-EEG given "subtle left parahippocampal/hippocampal T2 hyperintensity suggestive of recent seizures (this signal was not apparent 3 weeks ago, so new tumor infiltration in this area would be less likely)"> they are reducing steroids. On fent gtt/versed gtt -> and still restless moving all 4s. Opens eyes to voice but does not follow commands. RN asking about starting TF and removing foley. Some days ago had hematuria after self dc of foley. RN planning for enema. RD here for cortrak  12/4 -  ongoing eeg. On vent. On fent gtt, versed gtt and diprivan gtt. Restless when diprivan lowered to < 20 and hypotensive when diprivan increased to > 29mg/min though sedated. CT chest pending. 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 c-EEG ongoing - > This studyshowed evidence of epileptogenicity arising from left temporalregion. There is also severe diffuse encephalopathy, non specific etiology but likely secondary to sedation. On vent. Gets agitated easy. On Diprivan gtt, versed gtt, fentgtt. 40% fio2 on vent  12/6: increased decadron yesterday. Continues with sharp waves in L temporal region on cEEG but no frank sz. Ammonia normal. Not following commands this am. Kicking legs oob and feet pulling on foley.   12/7:  Still not following commands.. Airway noted to be mallampati II, grade I view with mac 4. htn this  am with sbp >190, starting PO agent as unable to wean sedation at this time with already this degree of hypertension. Too sedate this am for sbt  12/8: agreed pt will warrant extubation trial but prefer to do it more controlled than simply stopping sedation without sbt etc and extubating. We have added anti-htn meds as when sedation weaned bp spikes >200. We have also scheduled valium/oxy and clonidine in order to attempt to wean iv sedation. Previously precedex was unsuccessful and I nor family want to extubate pt only to need 5 point restraints in an awake but not alert pt. Extensive conversation had with daughter today via phone re: plan of oral to wean iv sedation and attempts at even brief sbt prior to extubation. However I discussed that since we will not be able to do typical assessment that while we expect he can maintain his airway we do not know what his mental status otherwise will be. I asked her to consider the potential need for reintubation +/- trach and she appropriately is hopeful that the pathology will be back before she and her family have to make that decision.  12/9:  Family meeting today to discuss pt's wishes should he fail. Unfortunately still awaiting pathology. Copious tan secretions, tmax 100.3.   12/10 severe agitation increase propofol drip and tapered Versed to off  12/11 Hypotensive overnight requiring Levophed drip, now on 50 mics of propofol and 200 mics of fentanyl  12/13 no overnight events  Consults:  Neurosurgery neuro  Procedures:  12/1: crainiotomy  Significant Diagnostic Tests:  CT head 11/25 > 6 - 7cm mass in left parietal lobe with areas of internal cystic necrosis.  Mild regional mass effect with no MLS.     MRI brain 12/2: Expected postoperative changes post gross total resection of enhancing component of left parietotemporal mass. Minimal enhancement at the resection cavity margins is probably postoperative but small volume residual tumor is  difficult to exclude due to irregular appearance of the preoperative mass.  T2 hyperintensity along the left hippocampus and posteromedial left thalamus. May be related to recent seizure activity, postsurgical, or rapid progression of infiltrating tumor.  CT Chest 12/4 - dilated ascending thoracic aorta to 4.7 cm  Bilateral pleural effusions with some adjacent areas of passive atelectatic change.   Micro Data:  Flu 11/25 > neg COVID 11/25 > neg Urine cx  12/4: neg resp cx 12/9: MRSA  Antimicrobials:   cefazolin 12/1 - 12/2 vanc 11/29 - 12/2, 12/9>>   Interim history/subjective:   Critically ill, intubated On multiple medications for sedation Cycling between agitation and sedation  Objective:  Blood pressure (!) 145/102, pulse 79, temperature 99 F (37.2 C), temperature source Axillary, resp. rate 18, height _0  (1.905 m), weight (!) 140.5 kg, SpO2 98 %.    Vent Mode: PRVC FiO2 (%):  [40 %] 40 % Set Rate:  [16 bmp] 16 bmp Vt Set:  [600 mL] 600 mL PEEP:  [5 cmH20] 5 cmH20 Pressure Support:  [5 cmH20-10 cmH20] 5 cmH20 Plateau Pressure:  [12 cmH20-22 cmH20] 17 cmH20   Intake/Output Summary (Last 24 hours) at 02/29/2020 0920 Last data filed at 02/29/2020 0730 Gross per 24 hour  Intake 3278.8 ml  Output 3840 ml  Net -561.2 ml   Filed Weights   02/26/20 0420 02/27/20 0500 02/29/20 0500  Weight: (!) 143.4 kg (!) 138.1 kg (!) 140.5 kg     General Appearance-obese, does not appear to be in distress Head: Normocephalic, atraumatic Eyes: Pupils reactive Throat: ett in place Lungs: Fair air entry, rhonchi Heart:  S1 and S2 normal, no murmur Abdomen:  Obese, Soft, no masses, no organomegaly Neurologic: RASS -4, sedated, pupils bilaterally equally reactive to light   Labs show no leukocytosis, stable anemia Chest x-ray independently reviewed-no acute infiltrative process, atelectasis  Resolved problems  Recurrent Hematuria when foley dc'ed - last noted  02/20/20 - 2nd episode. Suspect traumatic   Assessment & Plan:  Encephalopathy and agitation does persist -Discussed with neurosurgery this morning  Acute encephalopathy/hyperactivity-delirium Aphasia No seizures on EEG -On antiepileptics -Phenobarbital, Valium, oxycodone -On Seroquel -RASS goal of 0-1  Acute respiratory failure due to brain mass and acute encephalopathy -Tolerate spontaneous breathing trials -Mental status is a barrier to extubation however patient appears to be able to protect his airway and weaning -Patient is DNR -Do not reintubate status  Hypotension-likely related to medications -Clonidine discontinued -Levophed titrated  Seizure episode 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  He tolerated spontaneous breathing trials but concern here is about the need for high-dose sedation to control his agitated delirium without any airway.  Daughter is  still hopeful for a good outcome Discussed with daughter today  We will reevaluate this afternoon and decide on possible extubation Pathology results still pending  Daily Goals Checklist  Pain/Anxiety/Delirium protocol (if indicated): see mar VAP protocol (if indicated): yes Blood pressure target: Keep systolic blood pressure less than 170 DVT prophylaxis: lovenox Nutrition Status: TF  GI prophylaxis protonix Urinary catheter: foley removed 12/9 Central lines: picc Glucose control: ssi Mobility/therapy needs: Bedrest, continues to require restraints for patient safety Code Status: Full Family  Communication:  daughter [name] updated 12/13 Disposition: ICU  LABS    PULMONARY No results for input(s): PHART, PCO2ART, PO2ART, HCO3, TCO2, O2SAT in the last 168 hours.  Invalid input(s): PCO2, PO2  CBC Recent Labs  Lab 02/27/20 0450 02/28/20 0441 02/29/20 0543  HGB 12.7* 11.5* 12.3*  HCT 40.7 34.0* 39.0  WBC 12.0* 10.0 10.4  PLT 395 384 370    COAGULATION No results for input(s): INR in the last 168 hours.  CARDIAC  No results for input(s): TROPONINI in the last 168 hours. No results for input(s): PROBNP in the last 168 hours.   CHEMISTRY Recent Labs  Lab 02/23/20 0349 02/23/20 0749 02/23/20 0749 02/24/20 0415 02/25/20 0434 02/26/20 0413 02/27/20 0450 02/28/20 0441 02/29/20 0543  NA  --  139  --   --   --  139 140 133* 137  K  --  4.4   < >  --   --  4.2 4.5 4.3 3.8  CL  --  106  --   --   --  104 108 103 103  CO2  --  23  --   --   --  _0 GLUCOSE  --  134*  --   --   --  109* 162* 126* 97  BUN  --  16  --   --   --  27* 27* 20 16  CREATININE  --  0.58*  --   --   --  0.69 0.72 0.47* 0.53*  CALCIUM  --  8.3*  --   --   --  8.2* 8.7* 7.7* 8.4*  MG  --   --   --   --   --  2.2  --   --  2.2  PHOS 4.0  --   --  4.5 3.9  --   --   --  3.3   < > = values in this interval not displayed.   Estimated Creatinine Clearance: 148.5 mL/min (A) (by C-G formula based on SCr of 0.53 mg/dL (L)).   LIVER Recent Labs  Lab 02/23/20 0749  AST 25  ALT 45*  ALKPHOS 66  BILITOT 0.4  PROT 5.0*  ALBUMIN 2.3*     INFECTIOUS No results for input(s): LATICACIDVEN, PROCALCITON in the last 168 hours.   ENDOCRINE CBG (last 3)  Recent Labs    02/28/20 2320 02/29/20 0341 02/29/20 0737  GLUCAP 101* 92 88   The patient is critically ill with multiple organ systems failure and requires high complexity decision making for assessment and support, frequent evaluation and titration of therapies, application of advanced monitoring technologies and extensive  interpretation of multiple databases. Critical Care Time devoted to patient care services described in this note independent of APP/resident time (if applicable)  is 32 minutes.   Sherrilyn Rist MD [facility] Personal pager: [phone] If unanswered, please page CCM On-call: [phone]

## 2020-02-29 NOTE — Progress Notes (Signed)
Received call from radiology regarding CXR this AM; spoke with MD Olalere--Cortrak tube to be advanced 10 cm; order placed.

## 2020-02-29 NOTE — Progress Notes (Signed)
E-Link updated about episode of vomiting. Tube feeds stopped for now. Zofran given. Lungs clear/diminished  to auscultation at this time.

## 2020-02-29 NOTE — Procedures (Signed)
Cortrak  Tube Type:  Cortrak - 43 inches Tube Location:  Left nare Initial Placement:  Postpyloric Secured by: Bridle Technique Used to Measure Tube Placement:  Documented cm marking at nare/ corner of mouth Cortrak Secured At:  100 cm    Cortrak Tube Team Note:  Consult received to place a Cortrak feeding tube.   No x-ray is required. RN may begin using tube.   If the tube becomes dislodged please keep the tube and contact the Cortrak team at [email or web address] (password [email or web address]) for replacement.  If after hours and replacement cannot be delayed, place a NG tube and confirm placement with an abdominal x-ray.    Koleen Distance MS, RD, LDN Please refer to [facility] for RD and/or RD on-call/weekend/after hours pager

## 2020-03-01 DIAGNOSIS — J189 Pneumonia, unspecified organism: Secondary | ICD-10-CM | POA: Diagnosis not present

## 2020-03-01 DIAGNOSIS — G9340 Encephalopathy, unspecified: Secondary | ICD-10-CM | POA: Diagnosis not present

## 2020-03-01 DIAGNOSIS — G9389 Other specified disorders of brain: Secondary | ICD-10-CM | POA: Diagnosis not present

## 2020-03-01 DIAGNOSIS — I4891 Unspecified atrial fibrillation: Secondary | ICD-10-CM | POA: Diagnosis not present

## 2020-03-01 LAB — CBC WITH DIFFERENTIAL/PLATELET
Abs Immature Granulocytes: 0.16 10*3/uL — ABNORMAL HIGH (ref 0.00–0.07)
Basophils Absolute: 0 10*3/uL (ref 0.0–0.1)
Basophils Relative: 0 %
Eosinophils Absolute: 0.4 10*3/uL (ref 0.0–0.5)
Eosinophils Relative: 4 %
HCT: 37.1 % — ABNORMAL LOW (ref 39.0–52.0)
Hemoglobin: 11.7 g/dL — ABNORMAL LOW (ref 13.0–17.0)
Immature Granulocytes: 2 %
Lymphocytes Relative: 13 %
Lymphs Abs: 1.2 10*3/uL (ref 0.7–4.0)
MCH: 29.8 pg (ref 26.0–34.0)
MCHC: 31.5 g/dL (ref 30.0–36.0)
MCV: 94.4 fL (ref 80.0–100.0)
Monocytes Absolute: 0.8 10*3/uL (ref 0.1–1.0)
Monocytes Relative: 9 %
Neutro Abs: 7 10*3/uL (ref 1.7–7.7)
Neutrophils Relative %: 72 %
Platelets: 329 10*3/uL (ref 150–400)
RBC: 3.93 MIL/uL — ABNORMAL LOW (ref 4.22–5.81)
RDW: 13.6 % (ref 11.5–15.5)
WBC: 9.5 10*3/uL (ref 4.0–10.5)
nRBC: 0 % (ref 0.0–0.2)

## 2020-03-01 LAB — GLUCOSE, CAPILLARY
Glucose-Capillary: 101 mg/dL — ABNORMAL HIGH (ref 70–99)
Glucose-Capillary: 109 mg/dL — ABNORMAL HIGH (ref 70–99)
Glucose-Capillary: 145 mg/dL — ABNORMAL HIGH (ref 70–99)
Glucose-Capillary: 81 mg/dL (ref 70–99)
Glucose-Capillary: 94 mg/dL (ref 70–99)
Glucose-Capillary: 96 mg/dL (ref 70–99)

## 2020-03-01 MED ORDER — SENNOSIDES 8.8 MG/5ML PO SYRP: 10.0000 mL | ORAL_SOLUTION | Freq: Once | ORAL | Status: DC

## 2020-03-01 MED ORDER — SENNOSIDES 8.8 MG/5ML PO SYRP
10.0000 mL | ORAL_SOLUTION | Freq: Once | ORAL | Status: AC
  Administered 2020-03-01: 10 mL
  Filled 2020-03-01: qty 10

## 2020-03-01 MED ORDER — BISACODYL 10 MG RE SUPP
10.0000 mg | Freq: Once | RECTAL | Status: AC
  Administered 2020-03-01: 10 mg via RECTAL
  Filled 2020-03-01: qty 1

## 2020-03-01 NOTE — Progress Notes (Signed)
NAME:  Antonio Glenn, MRN:  [id number], DOB:  1960/05/19, LOS: 38 ADMISSION DATE:  02/11/2020, CONSULTATION DATE:  02/11/20 REFERRING MD:  Viona Gilmore  CHIEF COMPLAINT:  AMS / Seizures   Brief History   Antonio Glenn is a 59 y.o. male with hx of recently diagnosed brain tumor who was transferred from [facility] ED to [facility] 11/25 with AMS and seizures.  Had left craniotomy on Dec 1 with Dr. Marcello Moores.  Past Medical History  Brain Mass.  Significant Hospital Events   11/25 > admit.  12/1 -  CRANIOTOMY OF  LEFT PARIETAL TUMOR   12./3 - NSGY recommending c-EEG given "subtle left parahippocampal/hippocampal T2 hyperintensity suggestive of recent seizures (this signal was not apparent 3 weeks ago, so new tumor infiltration in this area would be less likely)"> they are reducing steroids. On fent gtt/versed gtt -> and still restless moving all 4s. Opens eyes to voice but does not follow commands. RN asking about starting TF and removing foley. Some days ago had hematuria after self dc of foley. RN planning for enema. RD here for cortrak  12/4 -  ongoing eeg. On vent. On fent gtt, versed gtt and diprivan gtt. Restless when diprivan lowered to < 20 and hypotensive when diprivan increased to > 26mg/min though sedated. CT chest pending. 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 c-EEG ongoing - > This studyshowed evidence of epileptogenicity arising from left temporalregion. There is also severe diffuse encephalopathy, non specific etiology but likely secondary to sedation. On vent. Gets agitated easy. On Diprivan gtt, versed gtt, fentgtt. 40% fio2 on vent  12/6: increased decadron yesterday. Continues with sharp waves in L temporal region on cEEG but no frank sz. Ammonia normal. Not following commands this am. Kicking legs oob and feet pulling on foley.   12/7:  Still not following commands.. Airway noted to be mallampati II, grade I view with mac 4. htn this  am with sbp >190, starting PO agent as unable to wean sedation at this time with already this degree of hypertension. Too sedate this am for sbt  12/8: agreed pt will warrant extubation trial but prefer to do it more controlled than simply stopping sedation without sbt etc and extubating. We have added anti-htn meds as when sedation weaned bp spikes >200. We have also scheduled valium/oxy and clonidine in order to attempt to wean iv sedation. Previously precedex was unsuccessful and I nor family want to extubate pt only to need 5 point restraints in an awake but not alert pt. Extensive conversation had with daughter today via phone re: plan of oral to wean iv sedation and attempts at even brief sbt prior to extubation. However I discussed that since we will not be able to do typical assessment that while we expect he can maintain his airway we do not know what his mental status otherwise will be. I asked her to consider the potential need for reintubation +/- trach and she appropriately is hopeful that the pathology will be back before she and her family have to make that decision.  12/9:  Family meeting today to discuss pt's wishes should he fail. Unfortunately still awaiting pathology. Copious tan secretions, tmax 100.3.   12/10 severe agitation increase propofol drip and tapered Versed to off  12/11 Hypotensive overnight requiring Levophed drip, now on 50 mics of propofol and 200 mics of fentanyl  12/13 no overnight events 12/14-no overnight events, still with  significant secretions  Consults:  Neurosurgery neuro  Procedures:  12/1: crainiotomy  Significant Diagnostic Tests:  CT head 11/25 > 6 - 7cm mass in left parietal lobe with areas of internal cystic necrosis.  Mild regional mass effect with no MLS.     MRI brain 12/2: Expected postoperative changes post gross total resection of enhancing component of left parietotemporal mass. Minimal enhancement at the resection cavity margins is  probably postoperative but small volume residual tumor is difficult to exclude due to irregular appearance of the preoperative mass.  T2 hyperintensity along the left hippocampus and posteromedial left thalamus. May be related to recent seizure activity, postsurgical, or rapid progression of infiltrating tumor.  CT Chest 12/4 - dilated ascending thoracic aorta to 4.7 cm  Bilateral pleural effusions with some adjacent areas of passive atelectatic change.   Micro Data:  Flu 11/25 > neg COVID 11/25 > neg Urine cx  12/4: neg resp cx 12/9, 12/13: MRSA  Antimicrobials:   cefazolin 12/1 - 12/2 vanc 11/29 - 12/2, 12/9>>  Interim history/subjective:   Critically ill, intubated On multiple medications for sedation  Objective:  Blood pressure (!) 81/55, pulse 81, temperature 98.8 F (37.1 C), temperature source Axillary, resp. rate 17, height _0  (1.905 m), weight (!) 137.7 kg, SpO2 96 %.    Vent Mode: PSV;CPAP FiO2 (%):  [40 %] 40 % Set Rate:  [16 bmp] 16 bmp Vt Set:  [600 mL] 600 mL PEEP:  [5 cmH20] 5 cmH20 Pressure Support:  [5 cmH20] 5 cmH20 Plateau Pressure:  [16 cmH20-18 cmH20] 17 cmH20   Intake/Output Summary (Last 24 hours) at 03/01/2020 1029 Last data filed at 03/01/2020 1000 Gross per 24 hour  Intake 2940.31 ml  Output 2910 ml  Net 30.31 ml   Filed Weights   02/27/20 0500 02/29/20 0500 03/01/20 0500  Weight: (!) 138.1 kg (!) 140.5 kg (!) 137.7 kg     General Appearance-obese, does not appear to be in distress Head: Normocephalic, atraumatic Eyes: Pupils reactive Throat: Endotracheal tube in place Lungs: Rhonchi bilaterally Heart:  S1 and S2 normal, no murmur Abdomen: Bowel sounds appreciated Neurologic: Sedate, not able to follow command  Resolved problems  Recurrent Hematuria when foley dc'ed - last noted 02/20/20 - 2nd episode. 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  He tolerated spontaneous breathing trials but concern here is about the need for high-dose sedation to control his agitated delirium without any airway.  Daughter is still hopeful for a good outcome Discussed with daughter today  We will reevaluate this afternoon and decide on possible extubation Pathology results still pending  Daily Goals Checklist  Pain/Anxiety/Delirium protocol (if indicated): see mar VAP protocol (if indicated): yes Blood pressure target: Keep systolic blood pressure  less than 170 DVT prophylaxis: lovenox Nutrition Status: TF  GI prophylaxis protonix Urinary catheter: foley removed 12/9 Central lines: picc Glucose control: ssi Mobility/therapy needs: Bedrest, continues to require restraints for patient safety Code Status: Full Family Communication:  daughter [name] updated 12/13 Disposition: ICU  LABS    PULMONARY No results for input(s): PHART, PCO2ART, PO2ART, HCO3, TCO2, O2SAT in the last 168 hours.  Invalid input(s): PCO2, PO2  CBC Recent Labs  Lab 02/28/20 0441 02/29/20 0543 03/01/20 0603  HGB 11.5* 12.3* 11.7*   HCT 34.0* 39.0 37.1*  WBC 10.0 10.4 9.5  PLT 384 370 329    COAGULATION No results for input(s): INR in the last 168 hours.  CARDIAC  No results for input(s): TROPONINI in the last 168 hours. No results for input(s): PROBNP in the last 168 hours.   CHEMISTRY Recent Labs  Lab 02/24/20 0415 02/25/20 0434 02/26/20 0413 02/26/20 0413 02/27/20 0450 02/28/20 0441 02/29/20 0543  NA  --   --  139  --  140 133* 137  K  --   --  4.2   < > 4.5 4.3 3.8  CL  --   --  104  --  108 103 103  CO2  --   --  22  --  _0 GLUCOSE  --   --  109*  --  162* 126* 97  BUN  --   --  27*  --  27* 20 16  CREATININE  --   --  0.69  --  0.72 0.47* 0.53*  CALCIUM  --   --  8.2*  --  8.7* 7.7* 8.4*  MG  --   --  2.2  --   --   --  2.2  PHOS 4.5 3.9  --   --   --   --  3.3   < > = values in this interval not displayed.   Estimated Creatinine Clearance: 146.9 mL/min (A) (by C-G formula based on SCr of 0.53 mg/dL (L)).   LIVER No results for input(s): AST, ALT, ALKPHOS, BILITOT, PROT, ALBUMIN, INR in the last 168 hours.   INFECTIOUS No results for input(s): LATICACIDVEN, PROCALCITON in the last 168 hours.   ENDOCRINE CBG (last 3)  Recent Labs    02/29/20 2324 03/01/20 0331 03/01/20 0746  GLUCAP 118* 101* 81   The patient is critically ill with multiple organ systems failure and requires high complexity decision making for assessment and support, frequent evaluation and titration of therapies, application of advanced monitoring technologies and extensive interpretation of multiple databases. Critical Care Time devoted to patient care services described in this note independent of APP/resident time (if applicable)  is 32 minutes.   Sherrilyn Rist MD [facility] Personal pager: [phone] If unanswered, please page CCM On-call: [phone]

## 2020-03-01 NOTE — Progress Notes (Signed)
Nutrition Follow-up  DOCUMENTATION CODES:   Obesity unspecified  INTERVENTION:   Tube feeds via Cortrak: - Restart Osmolite 1.5 @ 25 ml/hr and increase by 10 ml every 8 hours to goal rate of 55 ml/hr (1320 ml/day) - ProSource TF 90 ml TID  Tube feeding regimen provides 2220 kcal, 148 grams of protein, and 1003 ml of H2O.   Recommend scheduled bowel regimen - messaged MD for additional medications   NUTRITION DIAGNOSIS:   Inadequate oral intake related to lethargy/confusion as evidenced by NPO status.  Ongoing.   GOAL:   Patient will meet greater than or equal to 90% of their needs  Progressing with TF advancement   MONITOR:   Diet advancement,Labs,Weight trends,TF tolerance,I & O's  REASON FOR ASSESSMENT:   Consult Enteral/tube feeding initiation and management  ASSESSMENT:   59 year old male who presented on 11/25 with seizure-like activity. Pt was recently found to have brain mass concerning for neoplasm with plan for left craniotomy on 12/01.  Pt discussed during ICU rounds and with RN.  Pathology still pending.  Per MD pt now DNR, mental status barrier to extubation and pt with increased secretions. Goals of care being discussed with family.  TF off due to vomiting, cortrak placed post pyloric. Noted no bm documented since 12/7. Spoke with RN pt on scheduled colace, prn miralax given today.   11/29 cortrak placed; later pt pulled tube out 12/1 s/p crani of L parietal tumor  12/3 cortrak placed; tip gastric 12/13 pt vomited  12/13 s/p cortrak reposition to post pyloric position (4th portion of duodenum)  Patient is currently intubated on ventilator support MV: 12.1 L/min Temp (24hrs), Avg:99.1 F (37.3 C), Min:98.5 F (36.9 C), Max:100.1 F (37.8 C)  Propofol @ 21 ml/hr (25 mcg) provides 554 kcal  Medications reviewed and include: decadron, colace, SSI, phenobarbital  Fentanyl Levophed @ 2 mcg  Labs reviewed: CBG's: 101-81-96   Diet Order:    Diet Order    None      EDUCATION NEEDS:   No education needs have been identified at this time  Skin:  Skin Assessment: Reviewed RN Assessment  Last BM:  12/7  Height:   Ht Readings from Last 1 Encounters:  02/17/20 6\' 3"  (1.905 m)    Weight:   Wt Readings from Last 1 Encounters:  03/01/20 (!) 137.7 kg    Ideal Body Weight:     BMI:  Body mass index is 37.94 kg/m.  Estimated Nutritional Needs:   Kcal:  2250-2450  Protein:  120-145 grams  Fluid:  >/= 2.0 L  Ludy Messamore P., RD, LDN, CNSC See AMiON for contact information

## 2020-03-01 NOTE — Progress Notes (Signed)
Subjective: NAEs o/n.   Objective: Vital signs in last 24 hours: Temp:  [98.5 F (36.9 C)-100.1 F (37.8 C)] 100.1 F (37.8 C) (12/14 1200) Pulse Rate:  [67-92] 81 (12/14 1208) Resp:  [12-22] 18 (12/14 1208) BP: (81-170)/(51-106) 138/78 (12/14 1208) SpO2:  [93 %-100 %] 99 % (12/14 1208) FiO2 (%):  [40 %-100 %] 40 % (12/14 1208) Weight:  [137.7 kg] 137.7 kg (12/14 0500)  Intake/Output from previous day: 12/13 0701 - 12/14 0700 In: 2941.8 [I.V.:1485.1; NG/GT:285; IV Piggyback:1171.8] Out: 9470 [Urine:3260] Intake/Output this shift: Total I/O In: 646.5 [I.V.:261.5; IV Piggyback:385] Out: 850 [Urine:850]  Intubated on sedation Eyes open to voice, regards MAEs Incision c/d   Lab Results: Recent Labs    02/29/20 0543 03/01/20 0603  WBC 10.4 9.5  HGB 12.3* 11.7*  HCT 39.0 37.1*  PLT 370 329   BMET Recent Labs    02/28/20 0441 02/29/20 0543  NA 133* 137  K 4.3 3.8  CL 103 103  CO2 22 25  GLUCOSE 126* 97  BUN 20 16  CREATININE 0.47* 0.53*  CALCIUM 7.7* 8.4*    Studies/Results: DG Abd 1 View  Result Date: 02/29/2020 CLINICAL DATA:  Feeding tube placement EXAM: ABDOMEN - 1 VIEW COMPARISON:  February 17, 2020 FINDINGS: Feeding tube tip at level of 4 portion of duodenum. No bowel dilatation or air-fluid level to suggest bowel obstruction. No free air. Visualized left lung base clear. IMPRESSION: Feeding tube tip at level of 4 portion of duodenum. No bowel obstruction or free air evident on supine examination. Electronically Signed   By: Lowella Grip III M.D.   On: 02/29/2020 12:37   DG Chest Port 1 View  Result Date: 02/29/2020 CLINICAL DATA:  Acute respiratory failure EXAM: PORTABLE CHEST 1 VIEW COMPARISON:  02/25/2020 FINDINGS: Endotracheal tube tip is 6.5 cm above the base of the carina. Feeding tube tip appears to be in the distal esophagus. Low lung volumes with bibasilar atelectasis. No pleural effusion. The visualized bony structures of the thorax show  no acute abnormality. Telemetry leads overlie the chest. IMPRESSION: 1. Low volume film with streaky basilar opacity suggesting atelectasis. 2. Feeding tube tip appears to be in the distal esophagus. These results will be called to the ordering clinician or representative by the Radiologist Assistant, and communication documented in the PACS or [employer]. Electronically Signed   By: Misty Stanley M.D.   On: 02/29/2020 07:59    Assessment/Plan: Left parietal lobe tumor s/p resection - likely extubation tomorrow   Antonio Glenn 03/01/2020, 1:01 PM

## 2020-03-02 DIAGNOSIS — G9389 Other specified disorders of brain: Secondary | ICD-10-CM | POA: Diagnosis not present

## 2020-03-02 DIAGNOSIS — J189 Pneumonia, unspecified organism: Secondary | ICD-10-CM | POA: Diagnosis not present

## 2020-03-02 DIAGNOSIS — I4891 Unspecified atrial fibrillation: Secondary | ICD-10-CM | POA: Diagnosis not present

## 2020-03-02 DIAGNOSIS — G9340 Encephalopathy, unspecified: Secondary | ICD-10-CM | POA: Diagnosis not present

## 2020-03-02 LAB — CBC WITH DIFFERENTIAL/PLATELET
Abs Immature Granulocytes: 0.11 10*3/uL — ABNORMAL HIGH (ref 0.00–0.07)
Basophils Absolute: 0 10*3/uL (ref 0.0–0.1)
Basophils Relative: 0 %
Eosinophils Absolute: 0.4 10*3/uL (ref 0.0–0.5)
Eosinophils Relative: 4 %
HCT: 37.1 % — ABNORMAL LOW (ref 39.0–52.0)
Hemoglobin: 11.7 g/dL — ABNORMAL LOW (ref 13.0–17.0)
Immature Granulocytes: 1 %
Lymphocytes Relative: 11 %
Lymphs Abs: 1.1 10*3/uL (ref 0.7–4.0)
MCH: 29.6 pg (ref 26.0–34.0)
MCHC: 31.5 g/dL (ref 30.0–36.0)
MCV: 93.9 fL (ref 80.0–100.0)
Monocytes Absolute: 0.9 10*3/uL (ref 0.1–1.0)
Monocytes Relative: 9 %
Neutro Abs: 7.2 10*3/uL (ref 1.7–7.7)
Neutrophils Relative %: 75 %
Platelets: 337 10*3/uL (ref 150–400)
RBC: 3.95 MIL/uL — ABNORMAL LOW (ref 4.22–5.81)
RDW: 13.7 % (ref 11.5–15.5)
WBC: 9.7 10*3/uL (ref 4.0–10.5)
nRBC: 0 % (ref 0.0–0.2)

## 2020-03-02 LAB — GLUCOSE, CAPILLARY
Glucose-Capillary: 77 mg/dL (ref 70–99)
Glucose-Capillary: 95 mg/dL (ref 70–99)
Glucose-Capillary: 108 mg/dL — ABNORMAL HIGH (ref 70–99)
Glucose-Capillary: 112 mg/dL — ABNORMAL HIGH (ref 70–99)
Glucose-Capillary: 103 mg/dL — ABNORMAL HIGH (ref 70–99)
Glucose-Capillary: 116 mg/dL — ABNORMAL HIGH (ref 70–99)

## 2020-03-02 NOTE — Progress Notes (Signed)
Subjective: Patient extubated this morning  Objective: Vital signs in last 24 hours: Temp:  [99 F (37.2 C)-100.1 F (37.8 C)] 100 F (37.8 C) (12/15 0808) Pulse Rate:  [67-94] 89 (12/15 0850) Resp:  [12-24] 20 (12/15 0850) BP: (64-173)/(46-146) 144/98 (12/15 0850) SpO2:  [95 %-100 %] 98 % (12/15 0834) FiO2 (%):  [40 %-100 %] 40 % (12/15 0834) Weight:  [137.6 kg] 137.6 kg (12/15 0500)  Intake/Output from previous day: 12/14 0701 - 12/15 0700 In: 2927 [I.V.:1384.3; NG/GT:422.5; IV Piggyback:1120.2] Out: 2940 [Urine:2940] Intake/Output this shift: Total I/O In: -  Out: 125 [Urine:125]  On 4L [place] Eyes open spontaneously, PERRL Mumbles single words, not following commands Purposeful in all 4 extremities, but more spontaneous movement on left Incision c/d  Lab Results: Recent Labs    03/01/20 0603 03/02/20 0548  WBC 9.5 9.7  HGB 11.7* 11.7*  HCT 37.1* 37.1*  PLT 329 337   BMET Recent Labs    02/29/20 0543  NA 137  K 3.8  CL 103  CO2 25  GLUCOSE 97  BUN 16  CREATININE 0.53*  CALCIUM 8.4*    Studies/Results: DG Abd 1 View  Result Date: 02/29/2020 CLINICAL DATA:  Feeding tube placement EXAM: ABDOMEN - 1 VIEW COMPARISON:  February 17, 2020 FINDINGS: Feeding tube tip at level of 4 portion of duodenum. No bowel dilatation or air-fluid level to suggest bowel obstruction. No free air. Visualized left lung base clear. IMPRESSION: Feeding tube tip at level of 4 portion of duodenum. No bowel obstruction or free air evident on supine examination. Electronically Signed   By: Lowella Grip III M.D.   On: 02/29/2020 12:37    Assessment/Plan: S/p resection of left parietal lobe tumor - pulmonary toilet - will decrease steroids - continue AEDs - will need PT/OT/speech, likely eventual rehab   [name] 03/02/2020, 9:05 AM

## 2020-03-02 NOTE — Progress Notes (Signed)
Updated daughter about patient's current status

## 2020-03-02 NOTE — Progress Notes (Signed)
Wasted 25 mL of fentanyl with Carney Harder RN.

## 2020-03-02 NOTE — Progress Notes (Signed)
NAME:  Antonio Glenn, MRN:  [id number], DOB:  09/30/1960, LOS: 25 ADMISSION DATE:  02/11/2020, CONSULTATION DATE:  02/11/20 REFERRING MD:  Viona Gilmore  CHIEF COMPLAINT:  AMS / Seizures   Brief History   Antonio Glenn is a 58 y.o. male with hx of recently diagnosed brain tumor who was transferred from [place] ED to [facility] 11/25 with AMS and seizures.  Had left craniotomy on Dec 1 with Dr. Marcello Moores.  Past Medical History  Brain Mass.  Significant Hospital Events   11/25 > admit.  12/1 -  CRANIOTOMY OF  LEFT PARIETAL TUMOR   12./3 - NSGY recommending c-EEG given "subtle left parahippocampal/hippocampal T2 hyperintensity suggestive of recent seizures (this signal was not apparent 3 weeks ago, so new tumor infiltration in this area would be less likely)"> they are reducing steroids. On fent gtt/versed gtt -> and still restless moving all 4s. Opens eyes to voice but does not follow commands. RN asking about starting TF and removing foley. Some days ago had hematuria after self dc of foley. RN planning for enema. RD here for cortrak  12/4 -  ongoing eeg. On vent. On fent gtt, versed gtt and diprivan gtt. Restless when diprivan lowered to < 20 and hypotensive when diprivan increased to > 42mg/min though sedated. CT chest pending. 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 c-EEG ongoing - > This studyshowed evidence of epileptogenicity arising from left temporalregion. There is also severe diffuse encephalopathy, non specific etiology but likely secondary to sedation. On vent. Gets agitated easy. On Diprivan gtt, versed gtt, fentgtt. 40% fio2 on vent  12/6: increased decadron yesterday. Continues with sharp waves in L temporal region on cEEG but no frank sz. Ammonia normal. Not following commands this am. Kicking legs oob and feet pulling on foley.   12/7:  Still not following commands.. Airway noted to be mallampati II, grade I view with mac 4. htn this  am with sbp >190, starting PO agent as unable to wean sedation at this time with already this degree of hypertension. Too sedate this am for sbt  12/8: agreed pt will warrant extubation trial but prefer to do it more controlled than simply stopping sedation without sbt etc and extubating. We have added anti-htn meds as when sedation weaned bp spikes >200. We have also scheduled valium/oxy and clonidine in order to attempt to wean iv sedation. Previously precedex was unsuccessful and I nor family want to extubate pt only to need 5 point restraints in an awake but not alert pt. Extensive conversation had with daughter today via phone re: plan of oral to wean iv sedation and attempts at even brief sbt prior to extubation. However I discussed that since we will not be able to do typical assessment that while we expect he can maintain his airway we do not know what his mental status otherwise will be. I asked her to consider the potential need for reintubation +/- trach and she appropriately is hopeful that the pathology will be back before she and her family have to make that decision.  12/9:  Family meeting today to discuss pt's wishes should he fail. Unfortunately still awaiting pathology. Copious tan secretions, tmax 100.3.   12/10 severe agitation increase propofol drip and tapered Versed to off  12/11 Hypotensive overnight requiring Levophed drip, now on 50 mics of propofol and 200 mics of fentanyl  12/13 no overnight events 12/14-no overnight events, still with  significant secretions 12/15-no overnight events, tolerating pressure support  Consults:  Neurosurgery neuro  Procedures:  12/1: crainiotomy  Significant Diagnostic Tests:  CT head 11/25 > 6 - 7cm mass in left parietal lobe with areas of internal cystic necrosis.  Mild regional mass effect with no MLS.     MRI brain 12/2: Expected postoperative changes post gross total resection of enhancing component of left parietotemporal mass.  Minimal enhancement at the resection cavity margins is probably postoperative but small volume residual tumor is difficult to exclude due to irregular appearance of the preoperative mass.  T2 hyperintensity along the left hippocampus and posteromedial left thalamus. May be related to recent seizure activity, postsurgical, or rapid progression of infiltrating tumor.  CT Chest 12/4 - dilated ascending thoracic aorta to 4.7 cm  Bilateral pleural effusions with some adjacent areas of passive atelectatic change.   Micro Data:  Flu 11/25 > neg COVID 11/25 > neg Urine cx  12/4: neg resp cx 12/9, 12/13: MRSA  Antimicrobials:   cefazolin 12/1 - 12/2 vanc 11/29 - 12/2, 12/9>>  Interim history/subjective:   Critically ill, intubated On multiple medications for sedation No overnight events Tolerating pressure support this morning  Objective:  Blood pressure (!) 144/98, pulse 78, temperature 100 F (37.8 C), temperature source Axillary, resp. rate 16, height _0  (1.905 m), weight (!) 137.6 kg, SpO2 98 %.    Vent Mode: PSV;CPAP FiO2 (%):  [40 %-100 %] 40 % Set Rate:  [16 bmp] 16 bmp Vt Set:  [600 mL] 600 mL PEEP:  [5 cmH20] 5 cmH20 Pressure Support:  [5 cmH20-10 cmH20] 10 cmH20 Plateau Pressure:  [10 cmH20-22 cmH20] 14 cmH20   Intake/Output Summary (Last 24 hours) at 03/02/2020 0842 Last data filed at 03/02/2020 0800 Gross per 24 hour  Intake 2871.39 ml  Output 2990 ml  Net -118.61 ml   Filed Weights   02/29/20 0500 03/01/20 0500 03/02/20 0500  Weight: (!) 140.5 kg (!) 137.7 kg (!) 137.6 kg     General Appearance-obese, does not appear to be in distress, not following commands. Head: Normocephalic, atraumatic Eyes: Pupils reactive Throat: Endotracheal tube in place Lungs: Bilateral rhonchi, fair air entry Heart: S1-S2 appreciated, no murmur Abdomen: Bowel sounds appreciated Neurologic: Not able to follow commands  Resolved problems  Assessment & Plan:   Encephalopathy, agitation -On multiple medications to keep him calm  Acute encephalopathy/hyperactivity/delirium Aphasia No seizures on EEG -On antiepileptics -On phenobarbital, Valium, oxycodone, Seroquel  Acute respiratory failure due to brain mass and acute encephalopathy -Patient is DNR -Do not reintubate status -Tolerating weaning this morning -Extubation discussed with daughter, clarified that patient is not to be reintubated  Seizure episode 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  He tolerated spontaneous breathing trials but concern here is about the need for high-dose sedation to control his agitated delirium without any airway.  Daughter is still hopeful for a good outcome Discussed with daughter today   Daily Goals Checklist  Pain/Anxiety/Delirium protocol (if indicated): see mar VAP protocol (  if indicated): yes Blood pressure target: Keep systolic blood pressure less than 170 DVT prophylaxis: lovenox Nutrition Status: TF  GI prophylaxis protonix Urinary catheter: foley removed 12/9 Central lines: picc Glucose control: ssi Mobility/therapy needs: Bedrest, continues to require restraints for patient safety Code Status: Full Family Communication:  daughter [name] updated 12/13 Disposition: ICU  LABS    PULMONARY No results for input(s): PHART, PCO2ART, PO2ART, HCO3, TCO2, O2SAT in the last 168  hours.  Invalid input(s): PCO2, PO2  CBC Recent Labs  Lab 02/29/20 0543 03/01/20 0603 03/02/20 0548  HGB 12.3* 11.7* 11.7*  HCT 39.0 37.1* 37.1*  WBC 10.4 9.5 9.7  PLT 370 329 337    COAGULATION No results for input(s): INR in the last 168 hours.  CARDIAC  No results for input(s): TROPONINI in the last 168 hours. No results for input(s): PROBNP in the last 168 hours.   CHEMISTRY Recent Labs  Lab 02/25/20 0434 02/26/20 0413 02/26/20 0413 02/27/20 0450 02/28/20 0441 02/29/20 0543  NA  --  139  --  140 133* 137  K  --  4.2   < > 4.5 4.3 3.8  CL  --  104  --  108 103 103  CO2  --  22  --  _0 GLUCOSE  --  109*  --  162* 126* 97  BUN  --  27*  --  27* 20 16  CREATININE  --  0.69  --  0.72 0.47* 0.53*  CALCIUM  --  8.2*  --  8.7* 7.7* 8.4*  MG  --  2.2  --   --   --  2.2  PHOS 3.9  --   --   --   --  3.3   < > = values in this interval not displayed.   Estimated Creatinine Clearance: 146.8 mL/min (A) (by C-G formula based on SCr of 0.53 mg/dL (L)).   LIVER No results for input(s): AST, ALT, ALKPHOS, BILITOT, PROT, ALBUMIN, INR in the last 168 hours.   INFECTIOUS No results for input(s): LATICACIDVEN, PROCALCITON in the last 168 hours.   ENDOCRINE CBG (last 3)  Recent Labs    03/01/20 2332 03/02/20 0416 03/02/20 0806  GLUCAP 145* 77 95   The patient is critically ill with multiple organ systems failure and requires high complexity decision making for assessment and support, frequent evaluation and titration of therapies, application of advanced monitoring technologies and extensive interpretation of multiple databases. Critical Care Time devoted to patient care services described in this note independent of APP/resident time (if applicable)  is 35 minutes.   Sherrilyn Rist MD [facility] Personal pager: [phone] If unanswered, please page CCM On-call: [phone]

## 2020-03-02 NOTE — Procedures (Signed)
Extubation Procedure Note  Patient Details:   Name: KEANEN DOHSE DOB: February 08, 1961 MRN: [id number]   Airway Documentation:    Vent end date: (not recorded) Vent end time: (not recorded)   Evaluation  O2 sats: stable throughout Complications: No apparent complications Patient did tolerate procedure well. Bilateral Breath Sounds: Rhonchi,Diminished   Yes   Positive cuff leak noted. Patient placed on [place] 4L with humidity, no stridor noted.  Bayard Beaver 03/02/2020, 8:52 AM

## 2020-03-02 NOTE — Progress Notes (Deleted)
Consulted cardiology for endocarditis  Vegetations noted on echocardiogram

## 2020-03-03 ENCOUNTER — Other Ambulatory Visit: Payer: Self-pay | Admitting: Radiation Therapy

## 2020-03-03 DIAGNOSIS — G9340 Encephalopathy, unspecified: Secondary | ICD-10-CM | POA: Diagnosis not present

## 2020-03-03 DIAGNOSIS — I4891 Unspecified atrial fibrillation: Secondary | ICD-10-CM | POA: Diagnosis not present

## 2020-03-03 DIAGNOSIS — J189 Pneumonia, unspecified organism: Secondary | ICD-10-CM | POA: Diagnosis not present

## 2020-03-03 DIAGNOSIS — R569 Unspecified convulsions: Secondary | ICD-10-CM | POA: Diagnosis not present

## 2020-03-03 LAB — CULTURE, RESPIRATORY W GRAM STAIN

## 2020-03-03 LAB — CBC WITH DIFFERENTIAL/PLATELET
Abs Immature Granulocytes: 0.06 10*3/uL (ref 0.00–0.07)
Basophils Absolute: 0 10*3/uL (ref 0.0–0.1)
Basophils Relative: 0 %
Eosinophils Absolute: 0.4 10*3/uL (ref 0.0–0.5)
Eosinophils Relative: 5 %
HCT: 35.8 % — ABNORMAL LOW (ref 39.0–52.0)
Hemoglobin: 11.5 g/dL — ABNORMAL LOW (ref 13.0–17.0)
Immature Granulocytes: 1 %
Lymphocytes Relative: 12 %
Lymphs Abs: 1 10*3/uL (ref 0.7–4.0)
MCH: 30.1 pg (ref 26.0–34.0)
MCHC: 32.1 g/dL (ref 30.0–36.0)
MCV: 93.7 fL (ref 80.0–100.0)
Monocytes Absolute: 0.7 10*3/uL (ref 0.1–1.0)
Monocytes Relative: 9 %
Neutro Abs: 6.3 10*3/uL (ref 1.7–7.7)
Neutrophils Relative %: 73 %
Platelets: 325 10*3/uL (ref 150–400)
RBC: 3.82 MIL/uL — ABNORMAL LOW (ref 4.22–5.81)
RDW: 13.5 % (ref 11.5–15.5)
WBC: 8.5 10*3/uL (ref 4.0–10.5)
nRBC: 0 % (ref 0.0–0.2)

## 2020-03-03 LAB — GLUCOSE, CAPILLARY
Glucose-Capillary: 101 mg/dL — ABNORMAL HIGH (ref 70–99)
Glucose-Capillary: 113 mg/dL — ABNORMAL HIGH (ref 70–99)
Glucose-Capillary: 131 mg/dL — ABNORMAL HIGH (ref 70–99)
Glucose-Capillary: 92 mg/dL (ref 70–99)
Glucose-Capillary: 108 mg/dL — ABNORMAL HIGH (ref 70–99)

## 2020-03-03 MED ORDER — DIAZEPAM 5 MG PO TABS
5.0000 mg | ORAL_TABLET | Freq: Two times a day (BID) | ORAL | Status: DC | PRN
  Administered 2020-03-03 – 2020-03-29 (×28): 5 mg
  Filled 2020-03-03 (×28): qty 1

## 2020-03-03 MED ORDER — DEXAMETHASONE SODIUM PHOSPHATE 4 MG/ML IJ SOLN
1.0000 mg | INTRAMUSCULAR | Status: AC
  Administered 2020-03-04 – 2020-03-05 (×2): 1 mg via INTRAVENOUS
  Filled 2020-03-03 (×2): qty 1

## 2020-03-03 MED ORDER — AMLODIPINE BESYLATE 10 MG PO TABS
10.0000 mg | ORAL_TABLET | Freq: Every day | ORAL | Status: DC
  Administered 2020-03-03 – 2020-03-06 (×4): 10 mg via ORAL
  Filled 2020-03-03 (×4): qty 1

## 2020-03-03 NOTE — Progress Notes (Signed)
Subjective: NAEs o/n  Objective: Vital signs in last 24 hours: Temp:  [98.2 F (36.8 C)-99.6 F (37.6 C)] 98.2 F (36.8 C) (12/16 0800) Pulse Rate:  [61-92] 70 (12/16 0900) Resp:  [10-19] 12 (12/16 0900) BP: (122-183)/(63-137) 163/63 (12/16 0900) SpO2:  [97 %-100 %] 98 % (12/16 0900) Weight:  [136.5 kg] 136.5 kg (12/16 0500)  Intake/Output from previous day: 12/15 0701 - 12/16 0700 In: 1574.4 [I.V.:505.7; NG/GT:298.8; IV Piggyback:770] Out: 2175 [Urine:2175] Intake/Output this shift: Total I/O In: 109.5 [I.V.:109.5] Out: -   Incision c/d Currently drowsy, eyes open to voice, mumbling. MAEs though more spontaneous on left Per nursing, patient stated name to command and was forming intelligible sentences earlier today before his Seroquel administration  Lab Results: Recent Labs    03/02/20 0548 03/03/20 0528  WBC 9.7 8.5  HGB 11.7* 11.5*  HCT 37.1* 35.8*  PLT 337 325   BMET No results for input(s): NA, K, CL, CO2, GLUCOSE, BUN, CREATININE, CALCIUM in the last 72 hours.  Studies/Results: No results found.  Assessment/Plan: S/p L parietal craniotomy for tumor - PT/OT/Speech - cont ICU for now - pulm toilet - remove staples   LOS: 21 days    [name] 03/03/2020, 11:12 AM

## 2020-03-03 NOTE — Progress Notes (Signed)
NAME:  LINDY PENNISI, MRN:  [id number], DOB:  23-Feb-1961, LOS: 21 ADMISSION DATE:  02/11/2020, CONSULTATION DATE:  02/11/20 REFERRING MD:  Viona Gilmore  CHIEF COMPLAINT:  AMS / Seizures   Brief History   ANTHONEE GELIN is a 59 y.o. male with hx of recently diagnosed brain tumor who was transferred from [place] ED to [facility] 11/25 with AMS and seizures.  Had left craniotomy on Dec 1 with Dr. Marcello Moores.  Past Medical History  Brain Mass.  Significant Hospital Events   11/25 > admit.  12/1 -  CRANIOTOMY OF  LEFT PARIETAL TUMOR   12./3 - NSGY recommending c-EEG given "subtle left parahippocampal/hippocampal T2 hyperintensity suggestive of recent seizures (this signal was not apparent 3 weeks ago, so new tumor infiltration in this area would be less likely)"> they are reducing steroids. On fent gtt/versed gtt -> and still restless moving all 4s. Opens eyes to voice but does not follow commands. RN asking about starting TF and removing foley. Some days ago had hematuria after self dc of foley. RN planning for enema. RD here for cortrak  12/4 -  ongoing eeg. On vent. On fent gtt, versed gtt and diprivan gtt. Restless when diprivan lowered to < 20 and hypotensive when diprivan increased to > 9mg/min though sedated. CT chest pending. 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 c-EEG ongoing - > This studyshowed evidence of epileptogenicity arising from left temporalregion. There is also severe diffuse encephalopathy, non specific etiology but likely secondary to sedation. On vent. Gets agitated easy. On Diprivan gtt, versed gtt, fentgtt. 40% fio2 on vent  12/6: increased decadron yesterday. Continues with sharp waves in L temporal region on cEEG but no frank sz. Ammonia normal. Not following commands this am. Kicking legs oob and feet pulling on foley.   12/7:  Still not following commands.. Airway noted to be mallampati II, grade I view with mac 4. htn this  am with sbp >190, starting PO agent as unable to wean sedation at this time with already this degree of hypertension. Too sedate this am for sbt  12/8: agreed pt will warrant extubation trial but prefer to do it more controlled than simply stopping sedation without sbt etc and extubating. We have added anti-htn meds as when sedation weaned bp spikes >200. We have also scheduled valium/oxy and clonidine in order to attempt to wean iv sedation. Previously precedex was unsuccessful and I nor family want to extubate pt only to need 5 point restraints in an awake but not alert pt. Extensive conversation had with daughter today via phone re: plan of oral to wean iv sedation and attempts at even brief sbt prior to extubation. However I discussed that since we will not be able to do typical assessment that while we expect he can maintain his airway we do not know what his mental status otherwise will be. I asked her to consider the potential need for reintubation +/- trach and she appropriately is hopeful that the pathology will be back before she and her family have to make that decision.  12/9:  Family meeting today to discuss pt's wishes should he fail. Unfortunately still awaiting pathology. Copious tan secretions, tmax 100.3.   12/10 severe agitation increase propofol drip and tapered Versed to off  12/11 Hypotensive overnight requiring Levophed drip, now on 50 mics of propofol and 200 mics of fentanyl  12/13 no overnight events 12/14-no overnight events, still with  significant secretions 12/15-no overnight events, tolerating pressure support. Extubated 12/16-no overnight events, was following some commands this AM with nurses  Consults:  Neurosurgery neuro  Procedures:  12/1: crainiotomy  Significant Diagnostic Tests:  CT head 11/25 > 6 - 7cm mass in left parietal lobe with areas of internal cystic necrosis.  Mild regional mass effect with no MLS.     MRI brain 12/2: Expected postoperative  changes post gross total resection of enhancing component of left parietotemporal mass. Minimal enhancement at the resection cavity margins is probably postoperative but small volume residual tumor is difficult to exclude due to irregular appearance of the preoperative mass.  T2 hyperintensity along the left hippocampus and posteromedial left thalamus. May be related to recent seizure activity, postsurgical, or rapid progression of infiltrating tumor.  CT Chest 12/4 - dilated ascending thoracic aorta to 4.7 cm  Bilateral pleural effusions with some adjacent areas of passive atelectatic change.   Micro Data:  Flu 11/25 > neg COVID 11/25 > neg Urine cx  12/4: neg resp cx 12/9, 12/13: MRSA  Antimicrobials:   cefazolin 12/1 - 12/2 vanc 11/29 - 12/2, 12/9>>  Interim history/subjective:   Awake, alert, minimal interaction Responds to name calling Followed commands for the nurses this morning  Objective:  Blood pressure (!) 163/63, pulse 70, temperature 98.2 F (36.8 C), temperature source Oral, resp. rate 12, height _0  (1.905 m), weight (!) 136.5 kg, SpO2 98 %.        Intake/Output Summary (Last 24 hours) at 03/03/2020 1018 Last data filed at 03/03/2020 0930 Gross per 24 hour  Intake 1364.95 ml  Output 1825 ml  Net -460.05 ml   Filed Weights   03/01/20 0500 03/02/20 0500 03/03/20 0500  Weight: (!) 137.7 kg (!) 137.6 kg (!) 136.5 kg     General Appearance-obese, does not appear to be in distress Head: Normocephalic/atraumatic Eyes: Pupils reactive Lungs: Fair air entry, no added sounds Heart: S1-S2 appreciated, no murmur Abdomen: Bowel sounds appreciated Neurologic: Did follow commands earlier today, did not follow commands with me when I examined him  Assessment & Plan:  Encephalopathy, agitation -On multiple medications to keep him calm -May start weaning down medications as tolerated  Acute encephalopathy, aphasia -Continue antiepileptics -On  phenobarbital, Valium, oxycodone, Seroquel  Acute respiratory failure due to brain mass and acute encephalopathy -Remains DNR -Do not reintubate -Extubated and appears to be stable from this perspective at present  Seizure disorder 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  He tolerated spontaneous breathing trials but concern here is about the need for high-dose sedation to control his agitated delirium without any airway.  Daughter is still hopeful for a good outcome Discussed with daughter today   Daily Goals Checklist  Pain/Anxiety/Delirium protocol (if indicated): see mar VAP protocol (if indicated): yes Blood pressure target: Keep systolic blood pressure less than 170 DVT prophylaxis: lovenox Nutrition Status: TF  GI prophylaxis protonix Urinary catheter: foley removed 12/9 Central lines: picc Glucose control: ssi Mobility/therapy needs: Bedrest, continues to require restraints for patient safety Code  Status: Full Family Communication:  daughter Alyson Ingles updated 12/15 Disposition: ICU  LABS    PULMONARY No results for input(s): PHART, PCO2ART, PO2ART, HCO3, TCO2, O2SAT in the last 168 hours.  Invalid input(s): PCO2, PO2  CBC Recent Labs  Lab 03/01/20 0603 03/02/20 0548 03/03/20 0528  HGB 11.7* 11.7* 11.5*  HCT 37.1* 37.1* 35.8*  WBC 9.5 9.7 8.5  PLT 329 337 325    COAGULATION No results for input(s): INR in the last 168  hours.  CARDIAC  No results for input(s): TROPONINI in the last 168 hours. No results for input(s): PROBNP in the last 168 hours.   CHEMISTRY Recent Labs  Lab 02/26/20 0413 02/27/20 0450 02/28/20 0441 02/29/20 0543  NA 139 140 133* 137  K 4.2 4.5 4.3 3.8  CL 104 108 103 103  CO2 _0 GLUCOSE 109* 162* 126* 97  BUN 27* 27* 20 16  CREATININE 0.69 0.72 0.47* 0.53*  CALCIUM 8.2* 8.7* 7.7* 8.4*  MG 2.2  --   --  2.2  PHOS  --   --   --  3.3   Estimated Creatinine Clearance: 146.3 mL/min (A) (by C-G formula based on SCr of 0.53 mg/dL (L)).   LIVER No results for input(s): AST, ALT, ALKPHOS, BILITOT, PROT, ALBUMIN, INR in the last 168 hours.   INFECTIOUS No results for input(s): LATICACIDVEN, PROCALCITON in the last 168 hours.   ENDOCRINE CBG (last 3)  Recent Labs    03/02/20 2328 03/03/20 0326 03/03/20 0753  GLUCAP 116* 101* 113*   Sherrilyn Rist, MD [place] [facility] Pager: [phone]

## 2020-03-03 NOTE — Progress Notes (Signed)
Nutrition Follow-up  DOCUMENTATION CODES:   Obesity unspecified  INTERVENTION:   Tube feeds via Cortrak: -Osmolite 1.5 @ 55 ml/hr (1320 ml/day) - ProSource TF 90 ml TID  Tube feeding regimen provides 2220 kcal, 148 grams of protein, and 1003 ml of H2O.   NUTRITION DIAGNOSIS:   Inadequate oral intake related to lethargy/confusion as evidenced by NPO status.  Ongoing.   GOAL:   Patient will meet greater than or equal to 90% of their needs  Met with TF.   MONITOR:   Diet advancement,Labs,Weight trends,TF tolerance,I & O's  REASON FOR ASSESSMENT:   Consult Enteral/tube feeding initiation and management  ASSESSMENT:   59 year old male who presented on 11/25 with seizure-like activity. Pt was recently found to have brain mass concerning for neoplasm with plan for left craniotomy on 12/01.  Pt discussed during ICU rounds and with RN.  Pathology still pending.  Plan for rehab consults 12/17   11/29 cortrak placed; later pt pulled tube out 12/1 s/p crani of L parietal tumor  12/3 cortrak placed; tip gastric 12/13 pt vomited  12/13 s/p cortrak reposition to post pyloric position (4th portion of duodenum) 12/15 extubated   Medications reviewed and include: decadron, colace, SSI, phenobarbital   Labs reviewed: CBG's: [id number]   Diet Order:   Diet Order    None      EDUCATION NEEDS:   No education needs have been identified at this time  Skin:  Skin Assessment: Reviewed RN Assessment  Last BM:  12/15 large; rectal tube inserted  Height:   Ht Readings from Last 1 Encounters:  02/17/20 6' 3"  (1.905 m)    Weight:   Wt Readings from Last 1 Encounters:  03/03/20 (!) 136.5 kg    Ideal Body Weight:     BMI:  Body mass index is 37.61 kg/m.  Estimated Nutritional Needs:   Kcal:  2250-2450  Protein:  120-145 grams  Fluid:  >/= 2.0 L  Epifanio Labrador P., RD, LDN, CNSC See AMiON for contact information

## 2020-03-03 NOTE — Progress Notes (Signed)
Spoke with daughter [name] at bedside.  Family member pulled me aside and asked for the care team to change the patients code status to Full Code.  Paged and alerted Dr. Reatha Armour of NSG to this.

## 2020-03-04 DIAGNOSIS — R569 Unspecified convulsions: Secondary | ICD-10-CM | POA: Diagnosis not present

## 2020-03-04 LAB — CBC WITH DIFFERENTIAL/PLATELET
Abs Immature Granulocytes: 0.06 10*3/uL (ref 0.00–0.07)
Basophils Absolute: 0 10*3/uL (ref 0.0–0.1)
Basophils Relative: 0 %
Eosinophils Absolute: 0.6 10*3/uL — ABNORMAL HIGH (ref 0.0–0.5)
Eosinophils Relative: 8 %
HCT: 36.6 % — ABNORMAL LOW (ref 39.0–52.0)
Hemoglobin: 11.8 g/dL — ABNORMAL LOW (ref 13.0–17.0)
Immature Granulocytes: 1 %
Lymphocytes Relative: 16 %
Lymphs Abs: 1.1 10*3/uL (ref 0.7–4.0)
MCH: 29.6 pg (ref 26.0–34.0)
MCHC: 32.2 g/dL (ref 30.0–36.0)
MCV: 92 fL (ref 80.0–100.0)
Monocytes Absolute: 0.7 10*3/uL (ref 0.1–1.0)
Monocytes Relative: 10 %
Neutro Abs: 4.9 10*3/uL (ref 1.7–7.7)
Neutrophils Relative %: 65 %
Platelets: 338 10*3/uL (ref 150–400)
RBC: 3.98 MIL/uL — ABNORMAL LOW (ref 4.22–5.81)
RDW: 13.3 % (ref 11.5–15.5)
WBC: 7.4 10*3/uL (ref 4.0–10.5)
nRBC: 0 % (ref 0.0–0.2)

## 2020-03-04 LAB — BASIC METABOLIC PANEL
Anion gap: 11 (ref 5–15)
BUN: 11 mg/dL (ref 6–20)
CO2: 27 mmol/L (ref 22–32)
Calcium: 8.2 mg/dL — ABNORMAL LOW (ref 8.9–10.3)
Chloride: 103 mmol/L (ref 98–111)
Creatinine, Ser: 0.55 mg/dL — ABNORMAL LOW (ref 0.61–1.24)
GFR, Estimated: >60 mL/min (ref >60)
Glucose, Bld: 127 mg/dL — ABNORMAL HIGH (ref 70–99)
Potassium: 3 mmol/L — ABNORMAL LOW (ref 3.5–5.1)
Sodium: 141 mmol/L (ref 135–145)

## 2020-03-04 LAB — GLUCOSE, CAPILLARY
Glucose-Capillary: 103 mg/dL — ABNORMAL HIGH (ref 70–99)
Glucose-Capillary: 104 mg/dL — ABNORMAL HIGH (ref 70–99)
Glucose-Capillary: 104 mg/dL — ABNORMAL HIGH (ref 70–99)
Glucose-Capillary: 104 mg/dL — ABNORMAL HIGH (ref 70–99)
Glucose-Capillary: 107 mg/dL — ABNORMAL HIGH (ref 70–99)
Glucose-Capillary: 115 mg/dL — ABNORMAL HIGH (ref 70–99)
Glucose-Capillary: 122 mg/dL — ABNORMAL HIGH (ref 70–99)

## 2020-03-04 MED ORDER — POTASSIUM CHLORIDE 10 MEQ/100ML IV SOLN
10.0000 meq | INTRAVENOUS | Status: AC
  Administered 2020-03-04 (×4): 10 meq via INTRAVENOUS
  Filled 2020-03-04 (×4): qty 100

## 2020-03-04 MED ORDER — LACOSAMIDE 50 MG PO TABS
100.0000 mg | ORAL_TABLET | Freq: Two times a day (BID) | ORAL | Status: DC
  Administered 2020-03-04 – 2020-03-26 (×44): 100 mg
  Filled 2020-03-04 (×44): qty 2

## 2020-03-04 NOTE — Evaluation (Signed)
Occupational Therapy Evaluation Patient Details Name: Antonio Glenn MRN: [id number] DOB: April 05, 1960 Today's Date: 03/04/2020    History of Present Illness Antonio Glenn is a 59 y.o. male with hx of recently diagnosed brain tumor who was transferred from [facility] to [facility] 11/25 with AMS and seizures.CRANIOTOMY OF  LEFT PARIETAL TUMOR 12/1 and on vent, trach off vent 12/15   Clinical Impression   This 59 yo male admitted and underwent above presents to acute OT with PLOF of being independent with all basic ADLs. Currently pt is max A+2 for all mobility, has decreased sitting/standing balance and is max-total A for all basic ADLs and also with vision deficits as well as cognitive communication deficits. He will continue to benefit from acute OT with follow up on CIR.    Follow Up Recommendations  CIR;Supervision/Assistance - 24 hour    Equipment Recommendations  Other (comment) (TBD next venue)       Precautions / Restrictions Precautions Precautions: Fall Precaution Comments: foley and flexiseal Restrictions Weight Bearing Restrictions: No      Mobility Bed Mobility Overal bed mobility: Needs Assistance Bed Mobility: Rolling;Supine to Sit Rolling: Max assist;+2 for physical assistance;+2 for safety/equipment   Supine to sit: Max assist;+2 for safety/equipment;+2 for physical assistance     General bed mobility comments: heavy assist to complete movement of BLE and trunk to complete transition to sitting EOB. maxA to scoot hips to EOB for feet flat on floor. continued mod/maxA to maintain static sitting balance due to strong posterior lean    Transfers Overall transfer level: Needs assistance Equipment used: 2 person hand held assist Transfers: Sit to/from Stand Sit to Stand: Max assist;+2 physical assistance;+2 safety/equipment         General transfer comment: completed x4 through session with maxA of 2 to power up and steady in standing. blocking of RLE to  maintain.    Balance Overall balance assessment: Needs assistance Sitting-balance support: Single extremity supported;Feet supported Sitting balance-Leahy Scale: Poor Sitting balance - Comments: mod/maxA to maintain static sitting Postural control: Posterior lean;Right lateral lean Standing balance support: Bilateral upper extremity supported Standing balance-Leahy Scale: Poor Standing balance comment: maxA of 2 to maintain static stance                           ADL either performed or assessed with clinical judgement   ADL Overall ADL's : Needs assistance/impaired Eating/Feeding: Total assistance   Grooming: Maximal assistance;Wash/dry face;Bed level   Upper Body Bathing: Total assistance;Bed level   Lower Body Bathing: Total assistance Lower Body Bathing Details (indicate cue type and reason): max A+2 sit<>stand Upper Body Dressing : Total assistance;Sitting   Lower Body Dressing: Total assistance Lower Body Dressing Details (indicate cue type and reason): max A +2 sit<>stand Toilet Transfer: Maximal assistance;+2 for physical assistance Toilet Transfer Details (indicate cue type and reason): sit<>stand with having RN change out bed for chair behind him Toileting- Clothing Manipulation and Hygiene: Total assistance Toileting - Clothing Manipulation Details (indicate cue type and reason): max A +2 sit<>stand             Vision   Vision Assessment?: Vision impaired- to be further tested in functional context Additional Comments: Closes one eye intermittently            Pertinent Vitals/Pain Pain Assessment: Faces Faces Pain Scale: Hurts a little bit Pain Location: generalized grimacing Pain Descriptors / Indicators: Grimacing Pain Intervention(s): Limited activity within  patient's tolerance;Monitored during session;Repositioned        Extremity/Trunk Assessment Upper Extremity Assessment Upper Extremity Assessment: RUE deficits/detail RUE Deficits  / Details: Decreased AROM, moves minimally--did attempt to put washcloth from left hand into right (but was unable) RUE Coordination: decreased fine motor;decreased gross motor           Communication Communication Communication: Expressive difficulties (word finding difficulty, tangential)   Cognition Arousal/Alertness: Awake/alert Behavior During Therapy: Restless;WFL for tasks assessed/performed Overall Cognitive Status: Impaired/Different from baseline Area of Impairment: Orientation;Attention;Memory;Following commands;Safety/judgement;Awareness;Problem solving                 Orientation Level: Disoriented to;Place;Time Current Attention Level: Focused Memory: Decreased recall of precautions;Decreased short-term memory Following Commands: Follows one step commands inconsistently;Follows one step commands with increased time Safety/Judgement: Decreased awareness of safety;Decreased awareness of deficits Awareness:  (R side neglect/inattention) Problem Solving: Slow processing;Decreased initiation;Difficulty sequencing;Requires verbal cues;Requires tactile cues General Comments: pt able to state some coherent thoughts about situation "the doctor tried to fix my brain" but was generally tangential and highly distractable which limited full assessment of orientation and awareness. Pt with R-sided neglect/inattention but does respond to verbal stimuli in R visual field with repeated cues/prompts and can attend to R side for short periods of time              Home Living Family/patient expects to be discharged to:: Inpatient rehab                                 Additional Comments: pt unable to give reliable information about prior level of function or mobility at this time, no family present      Prior Functioning/Environment Level of Independence: Independent        Comments: per chart, pt independent prior to admission until recently. Pt unable to give  reliable or consistent info regarding prior function.        OT Problem List: Decreased strength;Decreased range of motion;Impaired balance (sitting and/or standing);Impaired vision/perception;Impaired UE functional use;Decreased cognition;Decreased safety awareness;Decreased coordination      OT Treatment/Interventions: Self-care/ADL training;DME and/or AE instruction;Patient/family education;Balance training;Cognitive remediation/compensation;Therapeutic exercise;Therapeutic activities;Visual/perceptual remediation/compensation    OT Goals(Current goals can be found in the care plan section) Acute Rehab OT Goals Patient Stated Goal: to get up OT Goal Formulation: Patient unable to participate in goal setting Time For Goal Achievement: 03/18/20 Potential to Achieve Goals: Good  OT Frequency: Min 2X/week              AM-PAC OT "6 Clicks" Daily Activity     Outcome Measure Help from another person eating meals?: Total Help from another person taking care of personal grooming?: A Lot Help from another person toileting, which includes using toliet, bedpan, or urinal?: A Lot Help from another person bathing (including washing, rinsing, drying)?: A Lot Help from another person to put on and taking off regular upper body clothing?: Total Help from another person to put on and taking off regular lower body clothing?: Total 6 Click Score: 9   End of Session Equipment Utilized During Treatment: Gait belt Nurse Communication: Mobility status;Need for lift equipment  Activity Tolerance: Patient tolerated treatment well Patient left: in chair;with call bell/phone within reach;with restraints reapplied  OT Visit Diagnosis: Unsteadiness on feet (R26.81);Other abnormalities of gait and mobility (R26.89);Muscle weakness (generalized) (M62.81);Other symptoms and signs involving cognitive function;Low vision, both eyes (H54.2);Cognitive communication deficit (R41.841) Symptoms and signs  involving  cognitive functions:  (craniotomy)                Time: 8016-5537 OT Time Calculation (min): 39 min Charges:  OT General Charges $OT Visit: 1 Visit OT Evaluation $OT Eval Moderate Complexity: 1 Mod  Golden Circle, OTR/L Acute [employer] Pager [phone] Office [phone]     Almon Register 03/04/2020, 3:31 PM

## 2020-03-04 NOTE — Progress Notes (Signed)
Inpatient Rehab Admissions Coordinator Note:   Per therapy recommendations, pt was screened for CIR candidacy by Shann Medal, PT, DPT.  At this time we are recommending a CIR consult and I will request an order per our protocol.  Please contact me with questions.   Shann Medal, PT, DPT [phone] 03/04/20 2:06 PM

## 2020-03-04 NOTE — Evaluation (Signed)
Clinical/Bedside Swallow Evaluation Patient Details  Name: Antonio Glenn MRN: [id number] Date of Birth: September 30, 1960  Today's Date: 03/04/2020 Time: SLP Start Time (ACUTE ONLY): 1002 SLP Stop Time (ACUTE ONLY): 1015 SLP Time Calculation (min) (ACUTE ONLY): 13 min  Past Medical History:  Past Medical History:  Diagnosis Date  . Brain tumor ([place])    Left parietal brain tumor  . PICC (peripherally inserted central catheter) in place 02/14/2020  . Seizure ([place]) 02/14/2020   Past Surgical History:  Past Surgical History:  Procedure Laterality Date  . APPLICATION OF CRANIAL NAVIGATION Left 02/17/2020   Procedure: APPLICATION OF CRANIAL NAVIGATION;  Surgeon: [name], MD;  Location: [place];  Service: Neurosurgery;  Laterality: Left;  . CRANIOTOMY Left 02/17/2020   Procedure: CRANIOTOMY OF  LEFT PARIETAL TUMOR;  Surgeon: [name], MD;  Location: [place];  Service: Neurosurgery;  Laterality: Left;   HPI:  Antonio Glenn is a 59 y.o. male with hx of recently diagnosed brain tumor (left parietal lobe) who was transferred from [place] ED to [facility] 11/25 with AMS and seizures. Underwent resection 12/1. Intubated 12/1-12/15.   Assessment / Plan / Recommendation Clinical Impression  Pt demonstrates multiple clinical indicators of an oropharyngeal dysphagia s/p left tumor resection and 15 day intubation. Suspect inadequate laryngeal protection marked by delayed cough with nectar thick liquids. 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 An instrumental assessment needed to adequately evaluation swallow function. SLP Visit Diagnosis: Dysphagia, unspecified (R13.10)    Aspiration Risk  Moderate aspiration risk    Diet Recommendation NPO   Medication Administration: Via alternative means    Other  Recommendations Oral Care Recommendations: Oral care  QID   Follow up Recommendations Inpatient Rehab      Frequency and Duration    2 weeks       Prognosis        Swallow Study   General HPI: Antonio Glenn is a 59 y.o. male with hx of recently diagnosed brain tumor (left parietal lobe) who was transferred from [place] ED to [facility] 11/25 with AMS and seizures. Underwent resection 12/1. Intubated 12/1-12/15. Type of Study: Bedside Swallow Evaluation Previous Swallow Assessment: none Diet Prior to this Study: NPO;NG Tube Temperature Spikes Noted: Yes Respiratory Status: Room air History of Recent Intubation: Yes Length of Intubations (days): 15 days Date extubated: 03/02/20 Behavior/Cognition: Alert;Cooperative;Pleasant mood;Distractible;Requires cueing;Other (Comment) (aphasic) Oral Cavity Assessment: Dry Oral Care Completed by SLP: Recent completion by staff Oral Cavity - Dentition: Adequate natural dentition Vision: Impaired for self-feeding Self-Feeding Abilities: Needs assist Patient Positioning: Upright in chair Baseline Vocal Quality: Wet;Hoarse;Low vocal intensity Volitional Cough: Other (Comment) (unable -vocal apraxia) Volitional Swallow: Unable to elicit    Oral/Motor/Sensory Function Overall Oral Motor/Sensory Function: Mild impairment Facial ROM: Reduced right;Suspected CN VII (facial) dysfunction Facial Symmetry: Abnormal symmetry right;Suspected CN VII (facial) dysfunction Facial Strength: Reduced right;Suspected CN VII (facial) dysfunction Lingual ROM:  (unable -apraxia)   Ice Chips Ice chips: Not tested   Thin Liquid Thin Liquid: Not tested    Nectar Thick Nectar Thick Liquid: Impaired Presentation: Cup Oral Phase Impairments: Reduced labial seal;Reduced lingual movement/coordination Pharyngeal Phase Impairments: Cough - Delayed;Multiple swallows;Suspected delayed Swallow   Honey Thick Honey Thick Liquid: Not tested   Puree Puree: Impaired Presentation: Spoon Oral Phase Impairments: Reduced labial  seal Pharyngeal Phase Impairments: Cough - Delayed   Solid  Solid: Not tested      Antonio Glenn 03/04/2020,11:00 AM   Antonio Glenn.Ed Risk analyst [phone] Office [phone]

## 2020-03-04 NOTE — Progress Notes (Signed)
Subjective: NAEs  Objective: Vital signs in last 24 hours: Temp:  [97.7 F (36.5 C)-100.5 F (38.1 C)] 100.5 F (38.1 C) (12/17 0812) Pulse Rate:  [68-95] 80 (12/17 0600) Resp:  [10-18] 10 (12/17 0600) BP: (97-178)/(53-93) 157/69 (12/17 0600) SpO2:  [92 %-99 %] 97 % (12/17 0600) Weight:  [137.3 kg] 137.3 kg (12/17 0500)  Intake/Output from previous day: 12/16 0701 - 12/17 0700 In: 535.6 [I.V.:498.9; IV Piggyback:36.7] Out: 2325 [Urine:1925; Stool:400] Intake/Output this shift: No intake/output data recorded.  Awake, alert.  Eyes open spontaneously.  Hoarse. Conversant but frequent meandering speech.  Right sided neglect but moving right extremity purposefully. Still inattentive but follows simple commands on left.  Incision c/d  Lab Results: Recent Labs    03/03/20 0528 03/04/20 0538  WBC 8.5 7.4  HGB 11.5* 11.8*  HCT 35.8* 36.6*  PLT 325 338   BMET Recent Labs    03/04/20 0538  NA 141  K 3.0*  CL 103  CO2 27  GLUCOSE 127*  BUN 11  CREATININE 0.55*  CALCIUM 8.2*    Studies/Results: No results found.  Assessment/Plan: S/p resection of L parietal mass - cont PT/OT/Speech - possible rehab candidate - will d/c Foley for another void trial - if tolerating PO, can DC Cortrak - lovenox for DVT ppx  [name] 03/04/2020, 8:59 AM

## 2020-03-04 NOTE — Evaluation (Signed)
Speech Language Pathology Evaluation Patient Details Name: Antonio Glenn MRN: [id number] DOB: 1960-09-17 Today's Date: 03/04/2020 Time: 2979-8921 SLP Time Calculation (min) (ACUTE ONLY): 13 min  Problem List:  Patient Active Problem List   Diagnosis Date Noted  . Respiratory failure ([place])   . HAP (hospital-acquired pneumonia)   . Altered mental status 02/11/2020  . Seizures ([place])   . Encephalopathy acute   . Brain mass   . New onset a-fib [facility])    Past Medical History:  Past Medical History:  Diagnosis Date  . Brain tumor ([place])    Left parietal brain tumor  . PICC (peripherally inserted central catheter) in place 02/14/2020  . Seizure ([place]) 02/14/2020   Past Surgical History:  Past Surgical History:  Procedure Laterality Date  . APPLICATION OF CRANIAL NAVIGATION Left 02/17/2020   Procedure: APPLICATION OF CRANIAL NAVIGATION;  Surgeon: [name], MD;  Location: [place];  Service: Neurosurgery;  Laterality: Left;  . CRANIOTOMY Left 02/17/2020   Procedure: CRANIOTOMY OF  LEFT PARIETAL TUMOR;  Surgeon: [name], MD;  Location: [place];  Service: Neurosurgery;  Laterality: Left;   HPI:  Antonio Glenn is a 59 y.o. male with hx of recently diagnosed brain tumor (left parietal lobe) who was transferred from [place] ED to [facility] 11/25 with AMS and seizures. Underwent resection 12/1. Intubated 12/1-12/15.   Assessment / Plan / Recommendation Clinical Impression  Pt exhibits global aphasia with impairments in comprehension, expression and motor planning challenges with vocal/verbal apraxia. Speech is dysfluent and pt able to produce phrases, short sentences with dysnomia, perseverations, semantic and phonemic paraphasias. Mostly unaware of errors. He does have moments of lability to overall situation. Followed one step commands with 40%. Achieved 0% confrontational naming and hierarchy of cues inconsistently effective. Could not repeat words or phrases. Cognitive deficits  present needing cues to focus attention on therapist. Inattention to right side. ST to continue for goals moving toward greater independence and decreased level of assist.    SLP Assessment  SLP Recommendation/Assessment: Patient needs continued Speech Lanaguage Pathology Services SLP Visit Diagnosis: Apraxia (R48.2);Aphasia (R47.01);Cognitive communication deficit (R41.841)    Follow Up Recommendations  Inpatient Rehab    Frequency and Duration min 2x/week  2 weeks      SLP Evaluation Cognition  Overall Cognitive Status: Impaired/Different from baseline Arousal/Alertness: Awake/alert Orientation Level: Disoriented to place;Disoriented to time;Disoriented to situation;Oriented to person (responsive naming and yes no) Attention: Sustained Sustained Attention: Impaired Sustained Attention Impairment: Verbal basic;Functional basic Memory:  (will continue to assess) Awareness: Impaired Awareness Impairment: Anticipatory impairment;Intellectual impairment;Emergent impairment Problem Solving: Impaired Problem Solving Impairment: Functional basic Executive Function: Self Monitoring;Self Correcting Self Monitoring: Impaired Self Correcting: Impaired Behaviors: Restless;Lability Safety/Judgment: Impaired       Comprehension  Auditory Comprehension Overall Auditory Comprehension: Impaired Yes/No Questions: Impaired Basic Biographical Questions: 0-25% accurate Basic Immediate Environment Questions: 0-24% accurate Commands: Impaired One Step Basic Commands: 25-49% accurate Interfering Components: Attention;Processing speed;Motor planning Visual Recognition/Discrimination Discrimination: Not tested Reading Comprehension Reading Status:  (TBA)    Expression Expression Primary Mode of Expression: Verbal Verbal Expression Overall Verbal Expression: Impaired Initiation: Impaired Automatic Speech:  (unable to count verbal or visual cues) Level of Generative/Spontaneous  Verbalization: Phrase Repetition: Impaired Level of Impairment: Word level Naming: Impairment Responsive: 0-25% accurate Confrontation: Impaired Convergent: Not tested (0% with cues) Divergent: Not tested Verbal Errors: Perseveration;Semantic paraphasias;Not aware of errors;Phonemic paraphasias Pragmatics: No impairment Written Expression Written Expression:  (TBA)   Oral / Motor  Oral  Motor/Sensory Function Overall Oral Motor/Sensory Function: Mild impairment Facial ROM: Reduced right;Suspected CN VII (facial) dysfunction Facial Symmetry: Abnormal symmetry right;Suspected CN VII (facial) dysfunction Facial Strength: Reduced right;Suspected CN VII (facial) dysfunction Lingual ROM:  (unable -apraxia) Motor Speech Overall Motor Speech: Impaired Respiration: Within functional limits Phonation: Wet;Hoarse;Low vocal intensity Resonance: Within functional limits Articulation: Impaired Level of Impairment: Word Intelligibility: Intelligibility reduced Word: 75-100% accurate Phrase: 50-74% accurate Motor Planning: Impaired (? will assess further) Level of Impairment: Phrase Motor Speech Errors: Unaware   GO                    Houston Siren 03/04/2020, 11:24 AM Orbie Pyo Colvin Caroli.Ed Risk analyst [phone] Office [phone]

## 2020-03-04 NOTE — Evaluation (Signed)
Physical Therapy Evaluation Patient Details Name: Antonio Glenn MRN: [id number] DOB: 07-09-1960 Today's Date: 03/04/2020   History of Present Illness  Antonio Glenn is a 59 y.o. male with hx of recently diagnosed brain tumor who was transferred from [facility] to [facility] 11/25 with AMS and seizures.CRANIOTOMY OF  LEFT PARIETAL TUMOR 12/1 and on vent, trach off vent 12/15  Clinical Impression  Pt in bed upon arrival of PT, agreeable to evaluation at this time. The pt is an unreliable historian at this time, and no family was present to answer questions about prior level of mobility, but the pt now presents with limitations in functional mobility, strength, coordination, power, static and dynamic stability, and safety awareness due to above dx, and will continue to benefit from skilled PT to address these deficits. The pt was able to complete transition to sitting EOB and multiple sit-stand transfers through today's session. He does require maxA of 2 to complete all mobility, and maxA with blocking of R knee to stand and maintain static stance. The pt was unable to initiate any stepping or wt shift at this time despite significant assist, and required 3rd person to move bed from behind patient and replace with recliner. The pt will benefit from significant continued therapies to address deficits in strength, attention, endurance, and stability to facilitate return to independence with mobility and self-care.      Follow Up Recommendations CIR    Equipment Recommendations  Other (comment) (defer to post acute)    Recommendations for Other Services Rehab consult     Precautions / Restrictions Precautions Precautions: Fall Precaution Comments: foley and flexiseal Restrictions Weight Bearing Restrictions: No      Mobility  Bed Mobility Overal bed mobility: Needs Assistance Bed Mobility: Rolling;Supine to Sit Rolling: Max assist;+2 for physical assistance;+2 for safety/equipment   Supine  to sit: Max assist;+2 for safety/equipment;+2 for physical assistance     General bed mobility comments: heavy assist to complete movement of BLE and trunk to complete transition to sitting EOB. maxA to scoot hips to EOB for feet flat on floor. continued mod/maxA to maintain static sitting balance due to strong posterior lean    Transfers Overall transfer level: Needs assistance Equipment used: 2 person hand held assist Transfers: Sit to/from Stand Sit to Stand: Max assist;+2 physical assistance;+2 safety/equipment         General transfer comment: completed x4 through session with maxA of 2 to power up and steady in standing. blocking of RLE to maintain.  Ambulation/Gait             General Gait Details: pt unable to achieve any wt shift or safely attempt steps/pivot at this time     Balance Overall balance assessment: Needs assistance Sitting-balance support: Single extremity supported;Feet supported Sitting balance-Leahy Scale: Poor Sitting balance - Comments: mod/maxA to maintain static sitting Postural control: Posterior lean;Right lateral lean Standing balance support: Bilateral upper extremity supported Standing balance-Leahy Scale: Poor Standing balance comment: maxA of 2 to maintain static stance                             Pertinent Vitals/Pain Pain Assessment: Faces Faces Pain Scale: Hurts a little bit Pain Location: generalized grimacing Pain Descriptors / Indicators: Grimacing Pain Intervention(s): Limited activity within patient's tolerance;Monitored during session    Home Living Family/patient expects to be discharged to:: Inpatient rehab  Additional Comments: pt unable to give reliable information about prior level of function or mobility at this time, no family present    Prior Function Level of Independence: Independent         Comments: per chart, pt independent prior to admission until recently. Pt unable to  give reliable or consistent info regarding prior function.     Hand Dominance        Extremity/Trunk Assessment   Upper Extremity Assessment Upper Extremity Assessment: Defer to OT evaluation    Lower Extremity Assessment Lower Extremity Assessment: RLE deficits/detail;Generalized weakness RLE Deficits / Details: pt able to move in gravity minimized plane, unable to move functionally to maintain weight in standing RLE Coordination: decreased fine motor;decreased gross motor    Cervical / Trunk Assessment Cervical / Trunk Assessment: Other exceptions Cervical / Trunk Exceptions: large body habitus  Communication   Communication: Expressive difficulties (difficulty word finding, tangential)  Cognition Arousal/Alertness: Awake/alert Behavior During Therapy: Restless;WFL for tasks assessed/performed Overall Cognitive Status: Impaired/Different from baseline Area of Impairment: Orientation;Attention;Memory;Following commands;Safety/judgement;Awareness;Problem solving                 Orientation Level: Disoriented to;Place;Time Current Attention Level: Focused Memory: Decreased recall of precautions;Decreased short-term memory Following Commands: Follows one step commands inconsistently;Follows one step commands with increased time Safety/Judgement: Decreased awareness of safety;Decreased awareness of deficits (R-sided neglect/inattention) Awareness:  (R-sided neglect/inattention) Problem Solving: Slow processing;Decreased initiation;Difficulty sequencing;Requires verbal cues;Requires tactile cues General Comments: pt able to state some coherent thoughts about situation "the doctor tried to fix my brain" but was generally tangential and highly distractable which limited full assessment of orientation and awareness. Pt with R-sided neglect/inattention but does respond to verbal stimuli in R visual field with repeated cues/prompts and can attend to R side for short periods of  time      General Comments General comments (skin integrity, edema, etc.): VSS through session, pt eager to mobilize. positioned in recliner with restraints attached and pt oriented to increase attention to R-side    Exercises     Assessment/Plan    PT Assessment Patient needs continued PT services  PT Problem List Decreased strength;Decreased range of motion;Decreased activity tolerance;Decreased balance;Decreased mobility;Decreased coordination;Decreased cognition;Decreased knowledge of use of DME;Decreased safety awareness;Obesity       PT Treatment Interventions DME instruction;Gait training;Stair training;Functional mobility training;Therapeutic activities;Therapeutic exercise;Balance training;Neuromuscular re-education;Patient/family education    PT Goals (Current goals can be found in the Care Plan section)  Acute Rehab PT Goals Patient Stated Goal: to get up PT Goal Formulation: With patient Time For Goal Achievement: 03/18/20 Potential to Achieve Goals: Fair    Frequency Min 4X/week   Barriers to discharge        Co-evaluation PT/OT/SLP Co-Evaluation/Treatment: Yes Reason for Co-Treatment: Complexity of the patient's impairments (multi-system involvement);Necessary to address cognition/behavior during functional activity;For patient/therapist safety;To address functional/ADL transfers PT goals addressed during session: Mobility/safety with mobility;Balance;Strengthening/ROM         AM-PAC PT "6 Clicks" Mobility  Outcome Measure Help needed turning from your back to your side while in a flat bed without using bedrails?: A Lot Help needed moving from lying on your back to sitting on the side of a flat bed without using bedrails?: A Lot Help needed moving to and from a bed to a chair (including a wheelchair)?: Total Help needed standing up from a chair using your arms (e.g., wheelchair or bedside chair)?: A Lot Help needed to walk in hospital room?: Total Help  needed climbing 3-5  steps with a railing? : Total 6 Click Score: 9    End of Session Equipment Utilized During Treatment: Gait belt Activity Tolerance: Patient tolerated treatment well Patient left: in chair;with call bell/phone within reach;with nursing/sitter in room;with restraints reapplied Nurse Communication: Mobility status;Need for lift equipment PT Visit Diagnosis: Unsteadiness on feet (R26.81);Other abnormalities of gait and mobility (R26.89);Muscle weakness (generalized) (M62.81);Hemiplegia and hemiparesis Hemiplegia - Right/Left: Right Hemiplegia - dominant/non-dominant: Dominant Hemiplegia - caused by: Unspecified    Time: 0160-1093 PT Time Calculation (min) (ACUTE ONLY): 39 min   Charges:   PT Evaluation $PT Eval High Complexity: 1 High PT Treatments $Therapeutic Activity: 8-22 mins        Karma Ganja, PT, DPT   Acute Rehabilitation Department Pager #: [phone]  Otho Bellows 03/04/2020, 10:24 AM

## 2020-03-04 NOTE — Progress Notes (Signed)
[facility] Consultation Progress Note  Patient: Antonio Glenn XKG:[id number]   PCP: Leonard Downing, MD DOB: Feb 01, 1961   DOA: 02/11/2020   DOS: 03/04/2020   Date of Service: the patient was seen and examined on 03/04/2020 Primary service: [name], MD    Brief hospital course: Pt. with PMH of obesity; admitted on 02/11/2020, with complaint of seizures and confusion, was found to have brain mass SP craniotomy on 12/1//2021.  Patient has been ICU since that. After extubation patient was referred to Hospitalist for medical management. 11/25 admission for seizures 12/1 craniotomy of the left parietal tumor, remains intubated 12/3 core track insertion 12/4 LTM EEG no seizures but epileptogenicity from left temporal region 12/5 prelim pathology report GBM, sent to [facility] for further evaluation, report not available. 12/9 family meeting and conversation with DNR and no intubation. 12/15 extubated to room air 12/17 PCCM signed off, TRH assuming medical care, low-grade fever  Currently further plan is monitor for improvement in mentation, monitor fever curve.  Subjective: Remains confused.  Unable to follow any commands.  No meaningful conversation with the patient.  Significant agitation intermittently.  Assessment and Plan: 1.  Acute metabolic encephalopathy Likely polypharmacy, CNS tumor as well as recent surgery. Medications are currently being weaned. Continue to monitor. Continue Seroquel and phenobarbital.  2.  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 Pathology results still pending. 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 On dexamethasone-management per primary team. Neurology was consulted, currently on Vimpat and phenobarbital for seizures. EEG negative for active seizures.  3.  Acute respiratory failure with hypoxia, POA secondary to encephalopathy. Needed intubation  initially. Extubated on 12/15 but Currently on room air. At risk for reintubation given his ongoing agitation and need for medication to assist with that.  4.  MRSA HAP. Treated with IV vancomycin. Course completed. Patient had low-grade temperature on 12/17 but Recheck procalcitonin level tomorrow.  5.  Paroxysmal A. Fib Currently rate controlled. Anticoagulation on hold due to CNS mass. Monitor.  6.  Thoracic aortic aneurysm Incidental diagnosis. Repeat CT scan in 6 months.  7.  Goals of care conversation Difficult social situation. Currently patient unable to participate in goals of care conversation. Initially based on the discussion with PCCM and the family patient was DNR Daughter who has POA, has transition patient back to full code on 12/16.  8.  Hypokalemia. Currently being treated. Monitor.  9.  Dysphagia. Currently unable to follow any commands.  Unable to swallow safely. Remains NPO. Tube feeding diet.  Recommendation on discharge: To be determined   Diet: NPO on tube feeding DVT Prophylaxis: Lovenox  Family Communication: no family was present at bedside, at the time of interview.   Disposition: We will continue to follow the patient.   Other Consultants: PCCM primary medical consultant Procedures: Intubation Craniotomy with removal of the tumor Antibiotics: Anti-infectives (From admission, onward)   Start     Dose/Rate Route Frequency Ordered Stop   02/29/20 0200  vancomycin (VANCOREADY) IVPB 1750 mg/350 mL        1,750 mg 175 mL/hr over 120 Minutes Intravenous Every 8 hours 02/28/20 1805 03/02/20 1955   02/25/20 1800  vancomycin (VANCOREADY) IVPB 2000 mg/400 mL  Status:  Discontinued        2,000 mg 200 mL/hr over 120 Minutes Intravenous Every 12 hours 02/25/20 1627 02/28/20 1805   02/17/20 2300  ceFAZolin (ANCEF) IVPB 2g/100 mL premix  2 g 200 mL/hr over 30 Minutes Intravenous Every 8 hours 02/17/20 1918 02/18/20 1507   02/17/20 0500   vancomycin (VANCOREADY) IVPB 1500 mg/300 mL        1,500 mg 150 mL/hr over 120 Minutes Intravenous 120 min pre-op 02/15/20 1325 02/18/20 1128      Objective: Physical Exam: Vitals:   03/04/20 1600 03/04/20 1700 03/04/20 1800 03/04/20 1900  BP: (!) 165/68 (!) 155/77 (!) 187/95 (!) 191/99  Pulse:      Resp: 17 16 19 15   Temp: 99.8 F (37.7 C)     TempSrc: Axillary     SpO2:      Weight:      Height:        Intake/Output Summary (Last 24 hours) at 03/04/2020 2007 Last data filed at 03/04/2020 1900 Gross per 24 hour  Intake 3092.29 ml  Output 1725 ml  Net 1367.29 ml   Filed Weights   03/02/20 0500 03/03/20 0500 03/04/20 0500  Weight: (!) 137.6 kg (!) 136.5 kg (!) 137.3 kg   General: Appear in mild distress, no Rash; Oral Mucosa Clear, moist. no Abnormal Neck Mass Or lumps, Conjunctiva normal  Cardiovascular: S1 and S2 Present, no Murmur, Respiratory: increased respiratory effort, Bilateral Air entry present and Clear to Auscultation, no Crackles, no wheezes Abdomen: Bowel Sound present, Soft and no tenderness Extremities: no Pedal edema, no calf tenderness Neurology: lethargic and not oriented to time, place, and person affect emotionally labile.  Speech incomprehensible, spontaneously moving all extremities, no new focal deficit Gait not checked due to patient safety concerns   Data Reviewed: CBC: Recent Labs  Lab 02/29/20 0543 03/01/20 0603 03/02/20 0548 03/03/20 0528 03/04/20 0538  WBC 10.4 9.5 9.7 8.5 7.4  NEUTROABS 7.5 7.0 7.2 6.3 4.9  HGB 12.3* 11.7* 11.7* 11.5* 11.8*  HCT 39.0 37.1* 37.1* 35.8* 36.6*  MCV 95.4 94.4 93.9 93.7 92.0  PLT 370 329 337 325 119   Basic Metabolic Panel: Recent Labs  Lab 02/27/20 0450 02/28/20 0441 02/29/20 0543 03/04/20 0538  NA 140 133* 137 141  K 4.5 4.3 3.8 3.0*  CL 108 103 103 103  CO2 24 22 25 27   GLUCOSE 162* 126* 97 127*  BUN 27* 20 16 11   CREATININE 0.72 0.47* 0.53* 0.55*  CALCIUM 8.7* 7.7* 8.4* 8.2*  MG  --    --  2.2  --   PHOS  --   --  3.3  --    Liver Function Tests: No results for input(s): AST, ALT, ALKPHOS, BILITOT, PROT, ALBUMIN in the last 168 hours. No results for input(s): LIPASE, AMYLASE in the last 168 hours. No results for input(s): AMMONIA in the last 168 hours. Cardiac Enzymes: No results for input(s): CKTOTAL, CKMB, CKMBINDEX, TROPONINI in the last 168 hours. BNP (last 3 results) No results for input(s): BNP in the last 8760 hours. CBG: Recent Labs  Lab 03/04/20 0003 03/04/20 0351 03/04/20 0805 03/04/20 1302 03/04/20 1558  GLUCAP 103* 104* 115* 104* 107*   Recent Results (from the past 240 hour(s))  Culture, respiratory     Status: None   Collection Time: 02/25/20 11:36 AM   Specimen: Tracheal Aspirate; Respiratory  Result Value Ref Range Status   Specimen Description TRACHEAL ASPIRATE  Final   Special Requests NONE  Final   Gram Stain   Final    NO WBC SEEN ABUNDANT GRAM POSITIVE COCCI IN PAIRS FEW GRAM POSITIVE RODS Performed at [facility], [place],  [place] [address]    Culture   Final    MODERATE METHICILLIN RESISTANT STAPHYLOCOCCUS AUREUS   Report Status 02/28/2020 FINAL  Final   Organism ID, Bacteria METHICILLIN RESISTANT STAPHYLOCOCCUS AUREUS  Final      Susceptibility   Methicillin resistant staphylococcus aureus - MIC*    CIPROFLOXACIN <=0.5 SENSITIVE Sensitive     ERYTHROMYCIN <=0.25 SENSITIVE Sensitive     GENTAMICIN <=0.5 SENSITIVE Sensitive     OXACILLIN >=4 RESISTANT Resistant     TETRACYCLINE <=1 SENSITIVE Sensitive     VANCOMYCIN 1 SENSITIVE Sensitive     TRIMETH/SULFA <=10 SENSITIVE Sensitive     CLINDAMYCIN <=0.25 SENSITIVE Sensitive     RIFAMPIN <=0.5 SENSITIVE Sensitive     Inducible Clindamycin NEGATIVE Sensitive     * MODERATE METHICILLIN RESISTANT STAPHYLOCOCCUS AUREUS  Culture, respiratory (non-expectorated)     Status: None   Collection Time: 02/29/20  9:30 AM   Specimen: Tracheal Aspirate; Respiratory   Result Value Ref Range Status   Specimen Description TRACHEAL ASPIRATE  Final   Special Requests NONE  Final   Gram Stain   Final    MODERATE WBC PRESENT,BOTH PMN AND MONONUCLEAR FEW GRAM POSITIVE RODS RARE GRAM POSITIVE COCCI RARE GRAM VARIABLE ROD Performed at [facility], [place] [address] [place], [place] [address]    Culture   Final    ABUNDANT METHICILLIN RESISTANT STAPHYLOCOCCUS AUREUS   Report Status 03/03/2020 FINAL  Final   Organism ID, Bacteria METHICILLIN RESISTANT STAPHYLOCOCCUS AUREUS  Final      Susceptibility   Methicillin resistant staphylococcus aureus - MIC*    CIPROFLOXACIN <=0.5 SENSITIVE Sensitive     ERYTHROMYCIN <=0.25 SENSITIVE Sensitive     GENTAMICIN <=0.5 SENSITIVE Sensitive     OXACILLIN >=4 RESISTANT Resistant     TETRACYCLINE <=1 SENSITIVE Sensitive     VANCOMYCIN 1 SENSITIVE Sensitive     TRIMETH/SULFA <=10 SENSITIVE Sensitive     CLINDAMYCIN <=0.25 SENSITIVE Sensitive     RIFAMPIN <=0.5 SENSITIVE Sensitive     Inducible Clindamycin NEGATIVE Sensitive     * ABUNDANT METHICILLIN RESISTANT STAPHYLOCOCCUS AUREUS    Studies: No results found.   Scheduled Meds: . amLODipine  10 mg Oral Daily  . bethanechol  10 mg Per Tube TID  . Chlorhexidine Gluconate Cloth  6 each Topical Daily  . dexamethasone (DECADRON) injection  1 mg Intravenous Q24H  . docusate  100 mg Per Tube BID  . enoxaparin (LOVENOX) injection  40 mg Subcutaneous Q24H  . feeding supplement (PROSource TF)  90 mL Per Tube TID  . insulin aspart  0-15 Units Subcutaneous Q4H  . lacosamide  100 mg Per Tube BID  . oxyCODONE  10 mg Per Tube Q6H  . pantoprazole sodium  40 mg Per Tube Daily  . PHENObarbital  90 mg Per Tube BID  . QUEtiapine  200 mg Per Tube BID   Continuous Infusions: . sodium chloride Stopped (02/26/20 2221)  . sodium chloride Stopped (03/02/20 1734)  . feeding supplement (OSMOLITE 1.5 CAL) 55 mL/hr at 03/04/20 1700   PRN Meds: sodium chloride, acetaminophen  **OR** acetaminophen, bisacodyl, diazepam, ondansetron **OR** ondansetron (ZOFRAN) IV, polyethylene glycol, promethazine  Time spent: 35 minutes  Author: Berle Mull, MD [facility] 03/04/2020 8:07 PM  To reach On-call, see care teams to locate the attending and reach out to them via [email or web address]. If 7PM-7AM, please contact night-coverage If you still have difficulty reaching the attending provider, please page the [facility] (Director on Call) for  [facility] on amion for assistance.

## 2020-03-05 DIAGNOSIS — R569 Unspecified convulsions: Secondary | ICD-10-CM | POA: Diagnosis not present

## 2020-03-05 LAB — GLUCOSE, CAPILLARY
Glucose-Capillary: 122 mg/dL — ABNORMAL HIGH (ref 70–99)
Glucose-Capillary: 126 mg/dL — ABNORMAL HIGH (ref 70–99)
Glucose-Capillary: 126 mg/dL — ABNORMAL HIGH (ref 70–99)
Glucose-Capillary: 101 mg/dL — ABNORMAL HIGH (ref 70–99)
Glucose-Capillary: 109 mg/dL — ABNORMAL HIGH (ref 70–99)

## 2020-03-05 LAB — CBC WITH DIFFERENTIAL/PLATELET
Abs Immature Granulocytes: 0.04 10*3/uL (ref 0.00–0.07)
Basophils Absolute: 0.1 10*3/uL (ref 0.0–0.1)
Basophils Relative: 1 %
Eosinophils Absolute: 0.5 10*3/uL (ref 0.0–0.5)
Eosinophils Relative: 8 %
HCT: 39.1 % (ref 39.0–52.0)
Hemoglobin: 13.3 g/dL (ref 13.0–17.0)
Immature Granulocytes: 1 %
Lymphocytes Relative: 20 %
Lymphs Abs: 1.3 10*3/uL (ref 0.7–4.0)
MCH: 30.6 pg (ref 26.0–34.0)
MCHC: 34 g/dL (ref 30.0–36.0)
MCV: 90.1 fL (ref 80.0–100.0)
Monocytes Absolute: 0.7 10*3/uL (ref 0.1–1.0)
Monocytes Relative: 11 %
Neutro Abs: 3.7 10*3/uL (ref 1.7–7.7)
Neutrophils Relative %: 59 %
Platelets: 372 10*3/uL (ref 150–400)
RBC: 4.34 MIL/uL (ref 4.22–5.81)
RDW: 13.5 % (ref 11.5–15.5)
WBC: 6.2 10*3/uL (ref 4.0–10.5)
nRBC: 0 % (ref 0.0–0.2)

## 2020-03-05 LAB — MAGNESIUM: Magnesium: 2.1 mg/dL (ref 1.7–2.4)

## 2020-03-05 LAB — COMPREHENSIVE METABOLIC PANEL
ALT: 78 U/L — ABNORMAL HIGH (ref 0–44)
AST: 41 U/L (ref 15–41)
Albumin: 2.4 g/dL — ABNORMAL LOW (ref 3.5–5.0)
Alkaline Phosphatase: 114 U/L (ref 38–126)
Anion gap: 11 (ref 5–15)
BUN: 14 mg/dL (ref 6–20)
CO2: 26 mmol/L (ref 22–32)
Calcium: 8.5 mg/dL — ABNORMAL LOW (ref 8.9–10.3)
Chloride: 104 mmol/L (ref 98–111)
Creatinine, Ser: 0.54 mg/dL — ABNORMAL LOW (ref 0.61–1.24)
GFR, Estimated: >60 mL/min (ref >60)
Glucose, Bld: 116 mg/dL — ABNORMAL HIGH (ref 70–99)
Potassium: 3.4 mmol/L — ABNORMAL LOW (ref 3.5–5.1)
Sodium: 141 mmol/L (ref 135–145)
Total Bilirubin: 0.6 mg/dL (ref 0.3–1.2)
Total Protein: 6.2 g/dL — ABNORMAL LOW (ref 6.5–8.1)

## 2020-03-05 MED ORDER — HYDRALAZINE HCL 25 MG PO TABS: 25.0000 mg | ORAL_TABLET | Freq: Four times a day (QID) | ORAL | Status: DC

## 2020-03-05 MED ORDER — HYDRALAZINE HCL 20 MG/ML IJ SOLN: 10.0000 mg | Freq: Four times a day (QID) | INTRAMUSCULAR | Status: DC | PRN

## 2020-03-05 MED ORDER — CLONIDINE HCL 0.1 MG PO TABS
0.1000 mg | ORAL_TABLET | Freq: Every day | ORAL | Status: DC
  Administered 2020-03-05 – 2020-03-26 (×22): 0.1 mg
  Filled 2020-03-05 (×22): qty 1

## 2020-03-05 MED ORDER — POTASSIUM CHLORIDE 20 MEQ PO PACK
40.0000 meq | PACK | Freq: Once | ORAL | Status: AC
  Administered 2020-03-05: 40 meq
  Filled 2020-03-05: qty 2

## 2020-03-05 NOTE — Plan of Care (Signed)
  Problem: Clinical Measurements: Goal: Ability to maintain clinical measurements within normal limits will improve Outcome: Progressing Goal: Will remain free from infection Outcome: Progressing Goal: Diagnostic test results will improve Outcome: Progressing Goal: Respiratory complications will improve Outcome: Progressing Goal: Cardiovascular complication will be avoided Outcome: Progressing   Problem: Activity: Goal: Risk for activity intolerance will decrease Outcome: Progressing   Problem: Nutrition: Goal: Adequate nutrition will be maintained Outcome: Progressing   Problem: Elimination: Goal: Will not experience complications related to bowel motility Outcome: Progressing   Problem: Pain Managment: Goal: General experience of comfort will improve Outcome: Progressing   Problem: Safety: Goal: Ability to remain free from injury will improve Outcome: Progressing   Problem: Skin Integrity: Goal: Risk for impaired skin integrity will decrease Outcome: Progressing   Problem: Activity: Goal: Ability to tolerate increased activity will improve Outcome: Progressing   Problem: Respiratory: Goal: Ability to maintain a clear airway and adequate ventilation will improve Outcome: Progressing   Problem: Role Relationship: Goal: Method of communication will improve Outcome: Progressing

## 2020-03-05 NOTE — Progress Notes (Signed)
[facility] Consultation Progress Note  Patient: Antonio Glenn IOE:[id number]   PCP: Leonard Downing, MD DOB: Aug 28, 1960   DOA: 02/11/2020   DOS: 03/05/2020   Date of Service: the patient was seen and examined on 03/05/2020 Primary service: [name], MD    Brief hospital course: Pt. with PMH of obesity; admitted on 02/11/2020, with complaint of seizures and confusion, was found to have brain mass SP craniotomy on 12/1//2021.  Patient has been ICU since that. After extubation patient was referred to Hospitalist for medical management. 11/25 admission for seizures 12/1 craniotomy of the left parietal tumor, remains intubated 12/3 core track insertion 12/4 LTM EEG no seizures but epileptogenicity from left temporal region 12/5 prelim pathology report GBM, sent to [facility] for further evaluation, report not available. 12/9 family meeting and conversation with DNR and no intubation. 12/15 extubated to room air 12/17 PCCM signed off, TRH assuming medical care, low-grade fever, resolved on its own.  Currently further plan is monitor for improvement in mentation.  Subjective: Continues to remain confused although shows some improvement today.  Answering questions appropriately.  Unable to carry out a prolonged conversation.  Unable to understand circumstances that he is here.  Assessment and Plan: 1.  Acute metabolic encephalopathy Likely polypharmacy, CNS tumor as well as recent surgery. Medications are currently being weaned. Continue to monitor. Continue Seroquel and phenobarbital. Clonidine added. We will discuss with neurosurgeon regarding changing narcotics from scheduled to as needed.  2.  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 Pathology results still pending. 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 On dexamethasone-management per primary team. Neurology was consulted, currently on Vimpat  and phenobarbital for seizures. EEG negative for active seizures.  3.  Acute respiratory failure with hypoxia, POA secondary to encephalopathy. Needed intubation initially. Extubated on 12/15, currently on room air. At risk for reintubation given his ongoing agitation and need for medication to assist with that.  4.  MRSA HAP. Low-grade fever on 12/17, resolved on its own Prior pneumonia, treated with IV vancomycin. Course completed.  5.  Paroxysmal A. Fib Currently rate controlled. Anticoagulation on hold due to CNS mass. Monitor.  6.  Thoracic aortic aneurysm Incidental diagnosis. Repeat CT scan in 6 months.  7.  Goals of care conversation Difficult social situation. Currently patient unable to participate in goals of care conversation. Initially based on the discussion with PCCM and the family patient was DNR Daughter who has POA, has transition patient back to full code on 12/16. Discussed with daughter on 12/18.On initial discussion on 12/9, there was no hope that the patient will be able to come off of the ventilator.  Now that the patient is on room air she wants to give the patient a chance and therefore wants to continue full code for now.  8.  Hypokalemia. Currently being treated. Monitor.  9.  Dysphagia. Currently unable to follow any commands.  Unable to swallow safely. Remains NPO. Tube feeding diet.  10.  Accelerated hypertension. Blood pressure 182 systolic. Started on clonidine, continue Norvasc.  As needed hydralazine.  Recommendation on discharge: To be determined   Diet: NPO on tube feeding DVT Prophylaxis: Lovenox  Family Communication: no family was present at bedside, at the time of interview.  Discussed with daughter on the phone all questions answered satisfactorily.  Disposition: We will continue to follow the patient.   Other Consultants: PCCM primary medical consultant Procedures: Intubation Craniotomy with removal of the  tumor  Antibiotics: Anti-infectives (From admission, onward)   Start     Dose/Rate Route Frequency Ordered Stop   02/29/20 0200  vancomycin (VANCOREADY) IVPB 1750 mg/350 mL        1,750 mg 175 mL/hr over 120 Minutes Intravenous Every 8 hours 02/28/20 1805 03/02/20 1955   02/25/20 1800  vancomycin (VANCOREADY) IVPB 2000 mg/400 mL  Status:  Discontinued        2,000 mg 200 mL/hr over 120 Minutes Intravenous Every 12 hours 02/25/20 1627 02/28/20 1805   02/17/20 2300  ceFAZolin (ANCEF) IVPB 2g/100 mL premix        2 g 200 mL/hr over 30 Minutes Intravenous Every 8 hours 02/17/20 1918 02/18/20 1507   02/17/20 0500  vancomycin (VANCOREADY) IVPB 1500 mg/300 mL        1,500 mg 150 mL/hr over 120 Minutes Intravenous 120 min pre-op 02/15/20 1325 02/18/20 1128      Objective: Physical Exam: Vitals:   03/05/20 1200 03/05/20 1300 03/05/20 1500 03/05/20 1600  BP: (!) 180/76 (!) 177/94 (!) 171/81 (!) 147/90  Pulse:      Resp: 15 16 13 15   Temp: 99 F (37.2 C)     TempSrc: Axillary     SpO2:      Weight:      Height:        Intake/Output Summary (Last 24 hours) at 03/05/2020 1735 Last data filed at 03/05/2020 1500 Gross per 24 hour  Intake 1332.74 ml  Output 875 ml  Net 457.74 ml   Filed Weights   03/03/20 0500 03/04/20 0500 03/05/20 0500  Weight: (!) 136.5 kg (!) 137.3 kg (!) 136.9 kg   General: Appear in mild distress, restless, no Rash; Oral Mucosa Clear, moist. no Abnormal Neck Mass Or lumps, Conjunctiva normal  Cardiovascular: S1 and S2 Present, no  Murmur, Respiratory: good respiratory effort, Bilateral Air entry present and CTA, no Crackles, no wheezes Abdomen: Bowel Sound present, Soft and no tenderness Extremities: trace Pedal edema Neurology: alert and not oriented to time, place, and person affect flat in affect. no new focal deficit Gait not checked due to patient safety concerns   Data Reviewed: CBC: Recent Labs  Lab 03/01/20 0603 03/02/20 0548 03/03/20 0528  03/04/20 0538 03/05/20 0457  WBC 9.5 9.7 8.5 7.4 6.2  NEUTROABS 7.0 7.2 6.3 4.9 3.7  HGB 11.7* 11.7* 11.5* 11.8* 13.3  HCT 37.1* 37.1* 35.8* 36.6* 39.1  MCV 94.4 93.9 93.7 92.0 90.1  PLT 329 337 325 338 630   Basic Metabolic Panel: Recent Labs  Lab 02/28/20 0441 02/29/20 0543 03/04/20 0538 03/05/20 0457  NA 133* 137 141 141  K 4.3 3.8 3.0* 3.4*  CL 103 103 103 104  CO2 22 25 27 26   GLUCOSE 126* 97 127* 116*  BUN 20 16 11 14   CREATININE 0.47* 0.53* 0.55* 0.54*  CALCIUM 7.7* 8.4* 8.2* 8.5*  MG  --  2.2  --  2.1  PHOS  --  3.3  --   --    Liver Function Tests: Recent Labs  Lab 03/05/20 0457  AST 41  ALT 78*  ALKPHOS 114  BILITOT 0.6  PROT 6.2*  ALBUMIN 2.4*   No results for input(s): LIPASE, AMYLASE in the last 168 hours. No results for input(s): AMMONIA in the last 168 hours. Cardiac Enzymes: No results for input(s): CKTOTAL, CKMB, CKMBINDEX, TROPONINI in the last 168 hours. BNP (last 3 results) No results for input(s): BNP in the last 8760 hours. CBG: Recent Labs  Lab 03/04/20 2335 03/05/20 0349 03/05/20 0810 03/05/20 1151 03/05/20 1539  GLUCAP 122* 122* 126* 126* 101*   Recent Results (from the past 240 hour(s))  Culture, respiratory     Status: None   Collection Time: 02/25/20 11:36 AM   Specimen: Tracheal Aspirate; Respiratory  Result Value Ref Range Status   Specimen Description TRACHEAL ASPIRATE  Final   Special Requests NONE  Final   Gram Stain   Final    NO WBC SEEN ABUNDANT GRAM POSITIVE COCCI IN PAIRS FEW GRAM POSITIVE RODS Performed at [facility], [place] [address], [place], [place] [address]    Culture   Final    MODERATE METHICILLIN RESISTANT STAPHYLOCOCCUS AUREUS   Report Status 02/28/2020 FINAL  Final   Organism ID, Bacteria METHICILLIN RESISTANT STAPHYLOCOCCUS AUREUS  Final      Susceptibility   Methicillin resistant staphylococcus aureus - MIC*    CIPROFLOXACIN <=0.5 SENSITIVE Sensitive     ERYTHROMYCIN <=0.25 SENSITIVE  Sensitive     GENTAMICIN <=0.5 SENSITIVE Sensitive     OXACILLIN >=4 RESISTANT Resistant     TETRACYCLINE <=1 SENSITIVE Sensitive     VANCOMYCIN 1 SENSITIVE Sensitive     TRIMETH/SULFA <=10 SENSITIVE Sensitive     CLINDAMYCIN <=0.25 SENSITIVE Sensitive     RIFAMPIN <=0.5 SENSITIVE Sensitive     Inducible Clindamycin NEGATIVE Sensitive     * MODERATE METHICILLIN RESISTANT STAPHYLOCOCCUS AUREUS  Culture, respiratory (non-expectorated)     Status: None   Collection Time: 02/29/20  9:30 AM   Specimen: Tracheal Aspirate; Respiratory  Result Value Ref Range Status   Specimen Description TRACHEAL ASPIRATE  Final   Special Requests NONE  Final   Gram Stain   Final    MODERATE WBC PRESENT,BOTH PMN AND MONONUCLEAR FEW GRAM POSITIVE RODS RARE GRAM POSITIVE COCCI RARE GRAM VARIABLE ROD Performed at [facility], [place] [address] [place], [place] [address]    Culture   Final    ABUNDANT METHICILLIN RESISTANT STAPHYLOCOCCUS AUREUS   Report Status 03/03/2020 FINAL  Final   Organism ID, Bacteria METHICILLIN RESISTANT STAPHYLOCOCCUS AUREUS  Final      Susceptibility   Methicillin resistant staphylococcus aureus - MIC*    CIPROFLOXACIN <=0.5 SENSITIVE Sensitive     ERYTHROMYCIN <=0.25 SENSITIVE Sensitive     GENTAMICIN <=0.5 SENSITIVE Sensitive     OXACILLIN >=4 RESISTANT Resistant     TETRACYCLINE <=1 SENSITIVE Sensitive     VANCOMYCIN 1 SENSITIVE Sensitive     TRIMETH/SULFA <=10 SENSITIVE Sensitive     CLINDAMYCIN <=0.25 SENSITIVE Sensitive     RIFAMPIN <=0.5 SENSITIVE Sensitive     Inducible Clindamycin NEGATIVE Sensitive     * ABUNDANT METHICILLIN RESISTANT STAPHYLOCOCCUS AUREUS    Studies: No results found.   Scheduled Meds: . amLODipine  10 mg Oral Daily  . bethanechol  10 mg Per Tube TID  . Chlorhexidine Gluconate Cloth  6 each Topical Daily  . cloNIDine  0.1 mg Per Tube Daily  . docusate  100 mg Per Tube BID  . enoxaparin (LOVENOX) injection  40 mg Subcutaneous Q24H  .  feeding supplement (PROSource TF)  90 mL Per Tube TID  . insulin aspart  0-15 Units Subcutaneous Q4H  . lacosamide  100 mg Per Tube BID  . oxyCODONE  10 mg Per Tube Q6H  . pantoprazole sodium  40 mg Per Tube Daily  . PHENObarbital  90 mg Per Tube BID  . QUEtiapine  200 mg Per Tube BID  Continuous Infusions: . sodium chloride Stopped (02/26/20 2221)  . sodium chloride Stopped (03/02/20 1734)  . feeding supplement (OSMOLITE 1.5 CAL) 55 mL/hr at 03/05/20 0700   PRN Meds: sodium chloride, acetaminophen **OR** acetaminophen, bisacodyl, diazepam, hydrALAZINE, ondansetron **OR** ondansetron (ZOFRAN) IV, polyethylene glycol, promethazine  Time spent: 35 minutes  Author: Berle Mull, MD [facility] 03/05/2020 5:35 PM  To reach On-call, see care teams to locate the attending and reach out to them via [email or web address]. If 7PM-7AM, please contact night-coverage If you still have difficulty reaching the attending provider, please page the [facility] (Director on Call) for [facility] on amion for assistance.

## 2020-03-05 NOTE — Progress Notes (Signed)
Overall stable.  Patient will awaken to voice.  Speech reasonably fluent.  Strength symmetric but hemineglect persist.  Wound clean and dry.  Overall progressing well.  Continue supportive efforts.

## 2020-03-06 ENCOUNTER — Inpatient Hospital Stay (HOSPITAL_COMMUNITY): Payer: BC Managed Care – PPO

## 2020-03-06 DIAGNOSIS — R609 Edema, unspecified: Secondary | ICD-10-CM | POA: Diagnosis not present

## 2020-03-06 DIAGNOSIS — R569 Unspecified convulsions: Secondary | ICD-10-CM | POA: Diagnosis not present

## 2020-03-06 LAB — CBC WITH DIFFERENTIAL/PLATELET
Abs Immature Granulocytes: 0.05 10*3/uL (ref 0.00–0.07)
Basophils Absolute: 0.1 10*3/uL (ref 0.0–0.1)
Basophils Relative: 1 %
Eosinophils Absolute: 0.6 10*3/uL — ABNORMAL HIGH (ref 0.0–0.5)
Eosinophils Relative: 9 %
HCT: 39.5 % (ref 39.0–52.0)
Hemoglobin: 12.8 g/dL — ABNORMAL LOW (ref 13.0–17.0)
Immature Granulocytes: 1 %
Lymphocytes Relative: 19 %
Lymphs Abs: 1.3 10*3/uL (ref 0.7–4.0)
MCH: 29.6 pg (ref 26.0–34.0)
MCHC: 32.4 g/dL (ref 30.0–36.0)
MCV: 91.4 fL (ref 80.0–100.0)
Monocytes Absolute: 0.7 10*3/uL (ref 0.1–1.0)
Monocytes Relative: 11 %
Neutro Abs: 4 10*3/uL (ref 1.7–7.7)
Neutrophils Relative %: 59 %
Platelets: 389 10*3/uL (ref 150–400)
RBC: 4.32 MIL/uL (ref 4.22–5.81)
RDW: 13.2 % (ref 11.5–15.5)
WBC: 6.7 10*3/uL (ref 4.0–10.5)
nRBC: 0 % (ref 0.0–0.2)

## 2020-03-06 LAB — COMPREHENSIVE METABOLIC PANEL
ALT: 107 U/L — ABNORMAL HIGH (ref 0–44)
AST: 51 U/L — ABNORMAL HIGH (ref 15–41)
Albumin: 2.5 g/dL — ABNORMAL LOW (ref 3.5–5.0)
Alkaline Phosphatase: 121 U/L (ref 38–126)
Anion gap: 10 (ref 5–15)
BUN: 15 mg/dL (ref 6–20)
CO2: 27 mmol/L (ref 22–32)
Calcium: 8.6 mg/dL — ABNORMAL LOW (ref 8.9–10.3)
Chloride: 104 mmol/L (ref 98–111)
Creatinine, Ser: 0.55 mg/dL — ABNORMAL LOW (ref 0.61–1.24)
GFR, Estimated: >60 mL/min (ref >60)
Glucose, Bld: 119 mg/dL — ABNORMAL HIGH (ref 70–99)
Potassium: 3.8 mmol/L (ref 3.5–5.1)
Sodium: 141 mmol/L (ref 135–145)
Total Bilirubin: 0.8 mg/dL (ref 0.3–1.2)
Total Protein: 6.4 g/dL — ABNORMAL LOW (ref 6.5–8.1)

## 2020-03-06 LAB — GLUCOSE, CAPILLARY
Glucose-Capillary: 107 mg/dL — ABNORMAL HIGH (ref 70–99)
Glucose-Capillary: 108 mg/dL — ABNORMAL HIGH (ref 70–99)
Glucose-Capillary: 109 mg/dL — ABNORMAL HIGH (ref 70–99)
Glucose-Capillary: 119 mg/dL — ABNORMAL HIGH (ref 70–99)
Glucose-Capillary: 126 mg/dL — ABNORMAL HIGH (ref 70–99)
Glucose-Capillary: 127 mg/dL — ABNORMAL HIGH (ref 70–99)
Glucose-Capillary: 92 mg/dL (ref 70–99)

## 2020-03-06 LAB — MAGNESIUM: Magnesium: 2.1 mg/dL (ref 1.7–2.4)

## 2020-03-06 MED ORDER — OXYCODONE HCL 5 MG PO TABS
5.0000 mg | ORAL_TABLET | Freq: Two times a day (BID) | ORAL | Status: AC
  Administered 2020-03-06 – 2020-03-08 (×6): 5 mg
  Filled 2020-03-06 (×6): qty 1

## 2020-03-06 MED ORDER — OXYCODONE HCL 5 MG PO TABS: 5.0000 mg | ORAL_TABLET | Freq: Four times a day (QID) | ORAL | Status: DC | PRN

## 2020-03-06 MED ORDER — AMLODIPINE BESYLATE 5 MG PO TABS
5.0000 mg | ORAL_TABLET | Freq: Every day | ORAL | Status: DC
  Administered 2020-03-07 – 2020-03-12 (×6): 5 mg
  Filled 2020-03-06 (×6): qty 1

## 2020-03-06 MED ORDER — OXYCODONE HCL 5 MG PO TABS
5.0000 mg | ORAL_TABLET | Freq: Four times a day (QID) | ORAL | Status: DC | PRN
  Administered 2020-03-10: 10 mg
  Administered 2020-03-10: 5 mg
  Administered 2020-03-13 – 2020-03-22 (×4): 10 mg
  Filled 2020-03-06 (×4): qty 2
  Filled 2020-03-06: qty 1
  Filled 2020-03-06: qty 2

## 2020-03-06 NOTE — Progress Notes (Signed)
Neurosurgery Service Progress Note  Subjective: No acute events overnight   Objective: Vitals:   03/06/20 0600 03/06/20 0700 03/06/20 0740 03/06/20 0759  BP: (!) 186/72 (!) 185/82 112/69   Pulse:  87 77   Resp: 14 17 16    Temp:    98.8 F (37.1 C)  TempSrc:    Axillary  SpO2: 95% 92% 96%   Weight:      Height:        Physical Exam: Eyes open to voice, interactive but confused, purposely stopped to make up a fake name to tell me, FCx4 but slower and weaker on the left  Assessment & Plan: 59 y.o. man s/p tumor rsxn w/ course c/b encephalopathy  -can make oxy prn -otherwise no change in NSGY plan of care  Judith Part  03/06/20 10:41 AM

## 2020-03-06 NOTE — Progress Notes (Signed)
VASCULAR LAB    Right upper extremity venous duplex has been performed.  See CV proc for preliminary results.  Gave results to [place], RN  Netha Dafoe, [place], RVT 03/06/2020, 2:46 PM

## 2020-03-06 NOTE — Progress Notes (Signed)
[facility] Consultation Progress Note  Patient: Antonio Glenn PFX:[id number]   PCP: Leonard Downing, MD DOB: 06-07-1960   DOA: 02/11/2020   DOS: 03/06/2020   Date of Service: the patient was seen and examined on 03/06/2020 Primary service: [name], MD    Brief hospital course: Pt. with PMH of obesity; admitted on 02/11/2020, with complaint of seizures and confusion, was found to have brain mass SP craniotomy on 12/1//2021.  Patient has been ICU since that. After extubation patient was referred to Hospitalist for medical management. 11/25 admission for seizures 12/1 craniotomy of the left parietal tumor, remains intubated 12/3 core track insertion 12/4 LTM EEG no seizures but epileptogenicity from left temporal region 12/5 prelim pathology report GBM, sent to [facility] for further evaluation, report not available. 12/9 family meeting and conversation with DNR and no intubation. 12/15 extubated to room air 12/17 PCCM signed off, TRH assuming medical care, low-grade fever, resolved on its own. Daughter converted patient back to FULL CODE. 12/19 right upper extremity Doppler shows SVT, no DVT.  Currently further plan is monitor for improvement in mentation.  Subjective: Answers questions. No nausea no vomiting. Remains sleepy otherwise.  Assessment and Plan: 1.  Acute metabolic encephalopathy Likely polypharmacy, CNS tumor as well as recent surgery. Medications are currently being weaned. Continue to monitor. Continue Seroquel and phenobarbital. Clonidine added. 12/19 opioids changed from scheduled to as needed.  2.  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 Pathology results still pending. 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 On dexamethasone-management per primary team. Neurology was consulted, currently on Vimpat and phenobarbital for seizures. EEG negative for active seizures.  3.   Acute respiratory failure with hypoxia, POA secondary to encephalopathy. Needed intubation initially. Extubated on 12/15, currently on room air. At risk for reintubation given his ongoing agitation and need for medication to assist with that.  4.  MRSA HAP. Low-grade fever on 12/17, resolved on its own Prior pneumonia, treated with IV vancomycin. Course completed.  5.  Paroxysmal A. Fib Currently rate controlled. Anticoagulation on hold due to CNS mass. Monitor.  6.  Thoracic aortic aneurysm Incidental diagnosis. Repeat CT scan in 6 months.  7.  Goals of care conversation Difficult social situation. Currently patient unable to participate in goals of care conversation. Initially based on the discussion with PCCM and the family patient was DNR Daughter who has POA, has transition patient back to full code on 12/16. Discussed with daughter on 12/18.On initial discussion on 12/9, there was no hope that the patient will be able to come off of the ventilator.  Now that the patient is on room air she wants to give the patient a chance and therefore wants to continue full code for now.  8.  Hypokalemia. Currently being treated. Monitor.  9.  Dysphagia. Currently unable to follow any commands.  Unable to swallow safely. Remains NPO. Tube feeding diet.  10.  Accelerated hypertension. Blood pressure was elevated to 200. Now improving. Started on clonidine, will continue. Continue Norvasc dose reduced from 10 mg to 5 mg.  As needed hydralazine.  11. Right upper extremity SVT. No evidence of DVT. No need for anticoagulation. Not a candidate for anticoagulation regardless.  12. Pain control. Postoperatively, on scheduled oxycodone 10 mg 4 times daily. Now on oxycodone 5 to 10 mg every 6 hours as needed. In order to avoid patient going into withdrawal will continue scheduled short acting OxyIR 5 mg twice daily for  the next 2 days.  13. Low-grade  temperature. Intermittent. Likely from SVT. X-ray negative. Monitor.  Recommendation on discharge: To be determined   Diet: NPO on tube feeding DVT Prophylaxis: Lovenox  Family Communication: no family was present at bedside, at the time of interview.  Discussed with daughter on the phone all questions answered satisfactorily.  Disposition: We will continue to follow the patient.   Other Consultants: PCCM primary medical consultant Procedures: Intubation Craniotomy with removal of the tumor Antibiotics: Anti-infectives (From admission, onward)   Start     Dose/Rate Route Frequency Ordered Stop   02/29/20 0200  vancomycin (VANCOREADY) IVPB 1750 mg/350 mL        1,750 mg 175 mL/hr over 120 Minutes Intravenous Every 8 hours 02/28/20 1805 03/02/20 1955   02/25/20 1800  vancomycin (VANCOREADY) IVPB 2000 mg/400 mL  Status:  Discontinued        2,000 mg 200 mL/hr over 120 Minutes Intravenous Every 12 hours 02/25/20 1627 02/28/20 1805   02/17/20 2300  ceFAZolin (ANCEF) IVPB 2g/100 mL premix        2 g 200 mL/hr over 30 Minutes Intravenous Every 8 hours 02/17/20 1918 02/18/20 1507   02/17/20 0500  vancomycin (VANCOREADY) IVPB 1500 mg/300 mL        1,500 mg 150 mL/hr over 120 Minutes Intravenous 120 min pre-op 02/15/20 1325 02/18/20 1128      Objective: Physical Exam: Vitals:   03/06/20 1400 03/06/20 1500 03/06/20 1553 03/06/20 1600  BP: 124/85 (!) 156/84  114/77  Pulse:      Resp: 15 16  13   Temp:   97.6 F (36.4 C)   TempSrc:   Oral   SpO2:      Weight:      Height:        Intake/Output Summary (Last 24 hours) at 03/06/2020 1831 Last data filed at 03/06/2020 1600 Gross per 24 hour  Intake 1210 ml  Output 1100 ml  Net 110 ml   Filed Weights   03/04/20 0500 03/05/20 0500 03/06/20 0500  Weight: (!) 137.3 kg (!) 136.9 kg (!) 136.8 kg   General: Appear in mild distress, restless, no Rash; Oral Mucosa Clear, moist. no Abnormal Neck Mass Or lumps, Conjunctiva normal   Cardiovascular: S1 and S2 Present, no  Murmur, Respiratory: good respiratory effort, Bilateral Air entry present and CTA, no Crackles, no wheezes Abdomen: Bowel Sound present, Soft and no tenderness Extremities: trace Pedal edema Neurology: alert and not oriented to time, place, and person affect flat in affect. no new focal deficit Gait not checked due to patient safety concerns   Data Reviewed: CBC: Recent Labs  Lab 03/02/20 0548 03/03/20 0528 03/04/20 0538 03/05/20 0457 03/06/20 0548  WBC 9.7 8.5 7.4 6.2 6.7  NEUTROABS 7.2 6.3 4.9 3.7 4.0  HGB 11.7* 11.5* 11.8* 13.3 12.8*  HCT 37.1* 35.8* 36.6* 39.1 39.5  MCV 93.9 93.7 92.0 90.1 91.4  PLT 337 325 338 372 834   Basic Metabolic Panel: Recent Labs  Lab 02/29/20 0543 03/04/20 0538 03/05/20 0457 03/06/20 0548  NA 137 141 141 141  K 3.8 3.0* 3.4* 3.8  CL 103 103 104 104  CO2 25 27 26 27   GLUCOSE 97 127* 116* 119*  BUN 16 11 14 15   CREATININE 0.53* 0.55* 0.54* 0.55*  CALCIUM 8.4* 8.2* 8.5* 8.6*  MG 2.2  --  2.1 2.1  PHOS 3.3  --   --   --    Liver Function Tests: Recent Labs  Lab 03/05/20 0457 03/06/20 0548  AST 41 51*  ALT 78* 107*  ALKPHOS 114 121  BILITOT 0.6 0.8  PROT 6.2* 6.4*  ALBUMIN 2.4* 2.5*   No results for input(s): LIPASE, AMYLASE in the last 168 hours. No results for input(s): AMMONIA in the last 168 hours. Cardiac Enzymes: No results for input(s): CKTOTAL, CKMB, CKMBINDEX, TROPONINI in the last 168 hours. BNP (last 3 results) No results for input(s): BNP in the last 8760 hours. CBG: Recent Labs  Lab 03/06/20 0032 03/06/20 0507 03/06/20 0758 03/06/20 1154 03/06/20 1548  GLUCAP 119* 108* 127* 126* 92   Recent Results (from the past 240 hour(s))  Culture, respiratory (non-expectorated)     Status: None   Collection Time: 02/29/20  9:30 AM   Specimen: Tracheal Aspirate; Respiratory  Result Value Ref Range Status   Specimen Description TRACHEAL ASPIRATE  Final   Special Requests NONE   Final   Gram Stain   Final    MODERATE WBC PRESENT,BOTH PMN AND MONONUCLEAR FEW GRAM POSITIVE RODS RARE GRAM POSITIVE COCCI RARE GRAM VARIABLE ROD Performed at [facility], [place] [address] [place], [place] [address]    Culture   Final    ABUNDANT METHICILLIN RESISTANT STAPHYLOCOCCUS AUREUS   Report Status 03/03/2020 FINAL  Final   Organism ID, Bacteria METHICILLIN RESISTANT STAPHYLOCOCCUS AUREUS  Final      Susceptibility   Methicillin resistant staphylococcus aureus - MIC*    CIPROFLOXACIN <=0.5 SENSITIVE Sensitive     ERYTHROMYCIN <=0.25 SENSITIVE Sensitive     GENTAMICIN <=0.5 SENSITIVE Sensitive     OXACILLIN >=4 RESISTANT Resistant     TETRACYCLINE <=1 SENSITIVE Sensitive     VANCOMYCIN 1 SENSITIVE Sensitive     TRIMETH/SULFA <=10 SENSITIVE Sensitive     CLINDAMYCIN <=0.25 SENSITIVE Sensitive     RIFAMPIN <=0.5 SENSITIVE Sensitive     Inducible Clindamycin NEGATIVE Sensitive     * ABUNDANT METHICILLIN RESISTANT STAPHYLOCOCCUS AUREUS    Studies: DG CHEST PORT 1 VIEW  Result Date: 03/06/2020 CLINICAL DATA:  Fever. EXAM: PORTABLE CHEST 1 VIEW COMPARISON:  February 29, 2020 FINDINGS: The ET tube is been removed. The feeding tube terminates below today's film. The cardiomediastinal silhouette is stable. No pneumothorax. Mild opacity in the medial right lung base. No other abnormalities. IMPRESSION: Mild opacity in the medial right lung base could represent atelectasis, vascular crowding, or developing infiltrate. No other acute abnormalities. Electronically Signed   By: Dorise Bullion III M.D   On: 03/06/2020 12:52   VAS [place] UPPER EXTREMITY VENOUS DUPLEX  Result Date: 03/06/2020 UPPER VENOUS STUDY  Indications: Edema, and Mid line in right arm Comparison Study: No prior study Performing Technologist: Sharion Dove RVS  Examination Guidelines: A complete evaluation includes B-mode imaging, spectral Doppler, color Doppler, and power Doppler as needed of all accessible  portions of each vessel. Bilateral testing is considered an integral part of a complete examination. Limited examinations for reoccurring indications may be performed as noted.  Right Findings: +----------+------------+---------+-----------+----------+--------------------+ RIGHT     CompressiblePhasicitySpontaneousProperties      Summary        +----------+------------+---------+-----------+----------+--------------------+ IJV                      Yes       Yes                                   +----------+------------+---------+-----------+----------+--------------------+  Subclavian               Yes       Yes                                   +----------+------------+---------+-----------+----------+--------------------+ Axillary                 Yes       Yes                                   +----------+------------+---------+-----------+----------+--------------------+ Brachial      Full       Yes       Yes                                   +----------+------------+---------+-----------+----------+--------------------+ Radial        Full                                                       +----------+------------+---------+-----------+----------+--------------------+ Ulnar         Full                                                       +----------+------------+---------+-----------+----------+--------------------+ Cephalic      None       No        No                 Acute, mid-line                                                              present        +----------+------------+---------+-----------+----------+--------------------+ Basilic       Full                                                       +----------+------------+---------+-----------+----------+--------------------+  Summary:  Right: Findings consistent with acute superficial vein thrombosis involving the right cephalic vein.  *See table(s) above for measurements and  observations.    Preliminary      Scheduled Meds: . amLODipine  10 mg Oral Daily  . bethanechol  10 mg Per Tube TID  . Chlorhexidine Gluconate Cloth  6 each Topical Daily  . cloNIDine  0.1 mg Per Tube Daily  . docusate  100 mg Per Tube BID  . enoxaparin (LOVENOX) injection  40 mg Subcutaneous Q24H  . feeding supplement (PROSource TF)  90 mL Per Tube TID  . insulin aspart  0-15 Units Subcutaneous Q4H  . lacosamide  100 mg Per Tube BID  . oxyCODONE  5 mg Per Tube BID  . pantoprazole sodium  40 mg Per Tube Daily  . PHENObarbital  90 mg Per Tube BID  . QUEtiapine  200 mg Per Tube BID   Continuous Infusions: . sodium chloride Stopped (02/26/20 2221)  . sodium chloride Stopped (03/02/20 1734)  . feeding supplement (OSMOLITE 1.5 CAL) 1,000 mL (03/06/20 0957)   PRN Meds: sodium chloride, acetaminophen **OR** acetaminophen, bisacodyl, diazepam, hydrALAZINE, ondansetron **OR** ondansetron (ZOFRAN) IV, oxyCODONE, polyethylene glycol, promethazine  Time spent: 35 minutes  Author: Berle Mull, MD [facility] 03/06/2020 6:31 PM  To reach On-call, see care teams to locate the attending and reach out to them via [email or web address]. If 7PM-7AM, please contact night-coverage If you still have difficulty reaching the attending provider, please page the [facility] (Director on Call) for [facility] on amion for assistance.

## 2020-03-06 NOTE — Plan of Care (Signed)
  Problem: Clinical Measurements: Goal: Ability to maintain clinical measurements within normal limits will improve Outcome: Progressing Goal: Will remain free from infection Outcome: Progressing Goal: Respiratory complications will improve Outcome: Progressing Goal: Cardiovascular complication will be avoided Outcome: Progressing   Problem: Activity: Goal: Risk for activity intolerance will decrease Outcome: Progressing   Problem: Nutrition: Goal: Adequate nutrition will be maintained Outcome: Progressing   Problem: Elimination: Goal: Will not experience complications related to bowel motility Outcome: Progressing Goal: Will not experience complications related to urinary retention Outcome: Progressing   Problem: Pain Managment: Goal: General experience of comfort will improve Outcome: Progressing   Problem: Safety: Goal: Ability to remain free from injury will improve Outcome: Progressing   Problem: Skin Integrity: Goal: Risk for impaired skin integrity will decrease Outcome: Progressing   Problem: Activity: Goal: Ability to tolerate increased activity will improve Outcome: Progressing   Problem: Respiratory: Goal: Ability to maintain a clear airway and adequate ventilation will improve Outcome: Progressing

## 2020-03-07 DIAGNOSIS — J9600 Acute respiratory failure, unspecified whether with hypoxia or hypercapnia: Secondary | ICD-10-CM | POA: Diagnosis not present

## 2020-03-07 DIAGNOSIS — I4891 Unspecified atrial fibrillation: Secondary | ICD-10-CM | POA: Diagnosis not present

## 2020-03-07 DIAGNOSIS — J189 Pneumonia, unspecified organism: Secondary | ICD-10-CM | POA: Diagnosis not present

## 2020-03-07 DIAGNOSIS — R4182 Altered mental status, unspecified: Secondary | ICD-10-CM | POA: Diagnosis not present

## 2020-03-07 DIAGNOSIS — G9340 Encephalopathy, unspecified: Secondary | ICD-10-CM | POA: Diagnosis not present

## 2020-03-07 LAB — CBC WITH DIFFERENTIAL/PLATELET
Abs Immature Granulocytes: 0.1 10*3/uL — ABNORMAL HIGH (ref 0.00–0.07)
Basophils Absolute: 0.1 10*3/uL (ref 0.0–0.1)
Basophils Relative: 1 %
Eosinophils Absolute: 0.8 10*3/uL — ABNORMAL HIGH (ref 0.0–0.5)
Eosinophils Relative: 10 %
HCT: 42.8 % (ref 39.0–52.0)
Hemoglobin: 13.9 g/dL (ref 13.0–17.0)
Immature Granulocytes: 1 %
Lymphocytes Relative: 16 %
Lymphs Abs: 1.2 10*3/uL (ref 0.7–4.0)
MCH: 29.6 pg (ref 26.0–34.0)
MCHC: 32.5 g/dL (ref 30.0–36.0)
MCV: 91.3 fL (ref 80.0–100.0)
Monocytes Absolute: 0.8 10*3/uL (ref 0.1–1.0)
Monocytes Relative: 11 %
Neutro Abs: 4.8 10*3/uL (ref 1.7–7.7)
Neutrophils Relative %: 61 %
Platelets: 436 10*3/uL — ABNORMAL HIGH (ref 150–400)
RBC: 4.69 MIL/uL (ref 4.22–5.81)
RDW: 13.4 % (ref 11.5–15.5)
WBC: 7.7 10*3/uL (ref 4.0–10.5)
nRBC: 0 % (ref 0.0–0.2)

## 2020-03-07 LAB — GLUCOSE, CAPILLARY
Glucose-Capillary: 103 mg/dL — ABNORMAL HIGH (ref 70–99)
Glucose-Capillary: 116 mg/dL — ABNORMAL HIGH (ref 70–99)
Glucose-Capillary: 120 mg/dL — ABNORMAL HIGH (ref 70–99)
Glucose-Capillary: 123 mg/dL — ABNORMAL HIGH (ref 70–99)
Glucose-Capillary: 123 mg/dL — ABNORMAL HIGH (ref 70–99)
Glucose-Capillary: 125 mg/dL — ABNORMAL HIGH (ref 70–99)

## 2020-03-07 LAB — COMPREHENSIVE METABOLIC PANEL
ALT: 117 U/L — ABNORMAL HIGH (ref 0–44)
AST: 47 U/L — ABNORMAL HIGH (ref 15–41)
Albumin: 2.6 g/dL — ABNORMAL LOW (ref 3.5–5.0)
Alkaline Phosphatase: 141 U/L — ABNORMAL HIGH (ref 38–126)
Anion gap: 11 (ref 5–15)
BUN: 17 mg/dL (ref 6–20)
CO2: 25 mmol/L (ref 22–32)
Calcium: 8.3 mg/dL — ABNORMAL LOW (ref 8.9–10.3)
Chloride: 102 mmol/L (ref 98–111)
Creatinine, Ser: 0.57 mg/dL — ABNORMAL LOW (ref 0.61–1.24)
GFR, Estimated: >60 mL/min (ref >60)
Glucose, Bld: 128 mg/dL — ABNORMAL HIGH (ref 70–99)
Potassium: 3.9 mmol/L (ref 3.5–5.1)
Sodium: 138 mmol/L (ref 135–145)
Total Bilirubin: 0.8 mg/dL (ref 0.3–1.2)
Total Protein: 6.8 g/dL (ref 6.5–8.1)

## 2020-03-07 LAB — MAGNESIUM: Magnesium: 2.1 mg/dL (ref 1.7–2.4)

## 2020-03-07 MED ORDER — RESOURCE THICKENUP CLEAR PO POWD
ORAL | Status: DC | PRN
  Filled 2020-03-07: qty 125

## 2020-03-07 NOTE — Progress Notes (Signed)
  Speech Language Pathology Treatment: Cognitive-Linquistic;Dysphagia  Patient Details Name: Antonio Glenn MRN: [id number] DOB: [birth year] Today's Date: 03/07/2020 Time: 2202-5427 SLP Time Calculation (min) (ACUTE ONLY): 22 min  Assessment / Plan / Recommendation Clinical Impression  Pt seen for PO trials, initially vocal quality quite wet but with verbal cues pt able to cough and clear throat well to achieve dry, only mildy dysphonic vocal quality. Pt had more automatic oral phase today with no signs of apraxia with feeding. Pt had immediate coughing with sips of water and ice, but tolerated nectar with consecutive straw sips without any coughing or further wet vocal quality. Puree also tolerated well. Pt body habitus not appropriate for MBS, FEES not available today. Will initiate conservative diet of puree and nectar thick liquids with ongoing Cortrak. Asked RN to observe with intake, hold with any concerns. Generally airway protection appears to be generally improving. Will f/u with FEES potentially tomorrow if needed for objective assessment of swallowing.   Pt continues to demonstrate aphasia with expressive greater than receptive deficits. Naming and word finding tasks particularly impaired by paraphasias, neologisms and perseveration despite a variety of cueing efforts. Pt accurate in less than 50% of trials with objects and pictures with poor awareness of errors.     HPI HPI: Antonio Glenn is a 59 y.o. male with hx of recently diagnosed brain tumor (left parietal lobe) who was transferred from [facility] ED to [facility] 11/25 with AMS and seizures. Underwent resection 12/1. Intubated 12/1-12/15.      SLP Plan  Continue with current plan of care       Recommendations  Diet recommendations: Dysphagia 1 (puree);Nectar-thick liquid Liquids provided via: Cup;Straw Medication Administration: Via alternative means Supervision: Staff to assist with self feeding;Full supervision/cueing for  compensatory strategies Compensations: Slow rate;Small sips/bites;Clear throat intermittently Postural Changes and/or Swallow Maneuvers: Seated upright 90 degrees;Out of bed for meals                SLP Visit Diagnosis: Dysphagia, oropharyngeal phase (R13.12);Aphasia (R47.01) Plan: Continue with current plan of care       GO               Herbie Baltimore, MA [place] Pager [phone] Office [phone]  Lynann Beaver 03/07/2020, 9:55 AM

## 2020-03-07 NOTE — Progress Notes (Addendum)
Subjective: Patient reports no headaches.  Objective: Vital signs in last 24 hours: Temp:  [97.6 F (36.4 C)-100.9 F (38.3 C)] 98.2 F (36.8 C) (12/20 1153) Pulse Rate:  [93-112] 93 (12/20 1200) Resp:  [9-23] 16 (12/20 1200) BP: (90-162)/(49-105) 142/81 (12/20 1200) SpO2:  [92 %-97 %] 94 % (12/20 1200) Weight:  [136.4 kg] 136.4 kg (12/20 0500)  Intake/Output from previous day: 12/19 0701 - 12/20 0700 In: 1320 [NG/GT:1320] Out: 1500 [Urine:1500] Intake/Output this shift: Total I/O In: 275 [NG/GT:275] Out: 650 [Urine:650]  Awake, alert, conversant.  Oriented to person, place only.  Paraphasias present but does have some sense of humor as well.  FC x 4, no drift. Incision c/d  Lab Results: Recent Labs    03/06/20 0548 03/07/20 0622  WBC 6.7 7.7  HGB 12.8* 13.9  HCT 39.5 42.8  PLT 389 436*   BMET Recent Labs    03/06/20 0548 03/07/20 0622  NA 141 138  K 3.8 3.9  CL 104 102  CO2 27 25  GLUCOSE 119* 128*  BUN 15 17  CREATININE 0.55* 0.57*  CALCIUM 8.6* 8.3*    Studies/Results: DG CHEST PORT 1 VIEW  Result Date: 03/06/2020 CLINICAL DATA:  Fever. EXAM: PORTABLE CHEST 1 VIEW COMPARISON:  February 29, 2020 FINDINGS: The ET tube is been removed. The feeding tube terminates below today's film. The cardiomediastinal silhouette is stable. No pneumothorax. Mild opacity in the medial right lung base. No other abnormalities. IMPRESSION: Mild opacity in the medial right lung base could represent atelectasis, vascular crowding, or developing infiltrate. No other acute abnormalities. Electronically Signed   By: Dorise Bullion III M.D   On: 03/06/2020 12:52   VAS [place] UPPER EXTREMITY VENOUS DUPLEX  Result Date: 03/06/2020 UPPER VENOUS STUDY  Indications: Edema, and Mid line in right arm Comparison Study: No prior study Performing Technologist: Sharion Dove RVS  Examination Guidelines: A complete evaluation includes B-mode imaging, spectral Doppler, color Doppler, and power  Doppler as needed of all accessible portions of each vessel. Bilateral testing is considered an integral part of a complete examination. Limited examinations for reoccurring indications may be performed as noted.  Right Findings: +----------+------------+---------+-----------+----------+--------------------+ RIGHT     CompressiblePhasicitySpontaneousProperties      Summary        +----------+------------+---------+-----------+----------+--------------------+ IJV                      Yes       Yes                                   +----------+------------+---------+-----------+----------+--------------------+ Subclavian               Yes       Yes                                   +----------+------------+---------+-----------+----------+--------------------+ Axillary                 Yes       Yes                                   +----------+------------+---------+-----------+----------+--------------------+ Brachial      Full       Yes       Yes                                   +----------+------------+---------+-----------+----------+--------------------+  Radial        Full                                                       +----------+------------+---------+-----------+----------+--------------------+ Ulnar         Full                                                       +----------+------------+---------+-----------+----------+--------------------+ Cephalic      None       No        No                 Acute, mid-line                                                              present        +----------+------------+---------+-----------+----------+--------------------+ Basilic       Full                                                       +----------+------------+---------+-----------+----------+--------------------+  Summary:  Right: Findings consistent with acute superficial vein thrombosis involving the right cephalic vein.  *See  table(s) above for measurements and observations.    Preliminary     Assessment/Plan: 59 yo M with epilepsy s/p resection of left parietal tumor -- pathology still pending -- cont PT/OT/Speech -- likely rehab dispo -- will likely get CT head Wednesday and restart Eliquis for Afib after  [name] 03/07/2020, 12:34 PM

## 2020-03-07 NOTE — TOC Progression Note (Signed)
Transition of Care [facility]) - Progression Note    Patient Details  Name: Antonio Glenn MRN: [id number] Date of Birth: [birth year]  [facility] CM/SW Contact  Oren Section Cleta Alberts, RN Phone Number: 03/07/2020, 5:15 PM  Clinical Narrative:   Antonio Glenn is a 59 y.o. male with hx of recently diagnosed brain tumor; s/p craniotomy of Lt parietal tumor on 12/1.  Pt extubated on 12/15.   Pt transferred to [facility] on 03/07/20; rehab consult in progress.   Expected Discharge Plan: IP Rehab Facility Barriers to Discharge: Continued Medical Work up  Expected Discharge Plan and Services Expected Discharge Plan: [place]   Discharge Planning Services: CM Consult   Living arrangements for the past 2 months: Single Family Home                                       Social Determinants of Health (SDOH) Interventions    Readmission Risk Interventions No flowsheet data found.  Reinaldo Raddle, RN, BSN  Trauma/Neuro ICU Case Manager [phone]

## 2020-03-07 NOTE — Progress Notes (Signed)
Spoke with pt's daughter over the phone- updated her on pt's status and informed of transfer to [facility]. Daughter has no further questions at this time.

## 2020-03-07 NOTE — Progress Notes (Signed)
Gave patient 4oz of nectar thick water.  Patient drank from cup, with assistance, without any coughing or s/s aspiration.  Patient very happy afterwards that he is able to drink.  Brother at bedside.  NAD.  Patient denies needs.

## 2020-03-07 NOTE — Consult Note (Signed)
Physical Medicine and Rehabilitation Consult   Reason for Consult: Functional deficits due to brain mass/seizures/encephalopathy.  Referring Physician: Dr. Marcello Moores   HPI: Antonio Glenn is a 59 y.o. 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 He was admitted on 02/11/20 after acting strange and enroute to ED had seizure with right sided weakness as well as A fib with RVR in ED. He converted to NSR on IV Cardizem. He was loaded with Keppra and started on decadron. CT head showed no significant change in left parietal mass.  EEG done revealing periodic epileptiform discharges due to underlying tumor.   Patient continued to have aphasia with bouts of agitation despite intermittent phenobarbital and ativan. He underwent crani with left parietal craniotomy for tumor resection by Dr. Marcello Moores but continued to be encephalopathic. He was monitored with LT-EEG done and Dr. Hortense Ramal felt that acute encephalopathy multifactorial.  MRSA HCAP treated with IV vancomycin. CT chest showed dilated thoracic aorta -4.7 cm with recommendations to repeat CT in 6 months.    Phenobarbital added and titrated upwards with ativan prn for agitation. Follow up MRI brain showed post op changes with gross total resection of left parietotemporal mass. He tolerated extubation 12/15 and respiratory status stable. Opioids changed to prn and clonidine added  He remains NPO with tube feeds for nutritional support due to wet voice/vocal apraxia. Plans for FEES tomorrow. Venous duplex done due to RUE edema and revealed superficial right cephalic vein thrombosis.   Patient with resultant global aphasia with apraxia, right inattention with right sided weakness affecting ADLs and mobility. CIR recommended due to functional decline.    Is now NPO- getting TFs- by [employer] Clearing throat constantly.  Has wrist and posey soft restraints due to pulling at [place].     Review of Systems  Unable to perform ROS: Mental acuity     Past Medical History:  Diagnosis Date  . Brain tumor ([place])    Left parietal brain tumor  . PICC (peripherally inserted central catheter) in place 02/14/2020  . Seizure ([place]) 02/14/2020    Past Surgical History:  Procedure Laterality Date  . APPLICATION OF CRANIAL NAVIGATION Left 02/17/2020   Procedure: APPLICATION OF CRANIAL NAVIGATION;  Surgeon: [name], MD;  Location: [place];  Service: Neurosurgery;  Laterality: Left;  . CRANIOTOMY Left 02/17/2020   Procedure: CRANIOTOMY OF  LEFT PARIETAL TUMOR;  Surgeon: [name], MD;  Location: [place];  Service: Neurosurgery;  Laterality: Left;    Family History: Unable to elicit due to aphasia.     Social History:  has no history on file for tobacco use, alcohol use, and drug use.    Allergies: No Known Allergies   Medications Prior to Admission  Medication Sig Dispense Refill  . levETIRAcetam (KEPPRA) 500 MG tablet Take 500 mg by mouth 2 (two) times daily.      Home: Home Living Family/patient expects to be discharged to:: Inpatient rehab Additional Comments: pt unable to give reliable information about prior level of function or mobility at this time, no family present  Lives With: Alone  Functional History: Prior Function Level of Independence: Independent Comments: per chart, pt independent prior to admission until recently. Pt unable to give reliable or consistent info regarding prior function. Functional Status:  Mobility: Bed Mobility Overal bed mobility: Needs Assistance Bed Mobility: Rolling,Supine to Sit Rolling: Max assist,+2 for physical assistance,+2 for safety/equipment  Supine to sit: Max assist,+2 for safety/equipment,+2 for physical assistance General bed mobility comments: heavy assist to complete movement of BLE and trunk to complete transition to sitting EOB. maxA to scoot hips to EOB for feet flat on floor. continued mod/maxA to  maintain static sitting balance due to strong posterior lean Transfers Overall transfer level: Needs assistance Equipment used: 2 person hand held assist Transfers: Sit to/from Stand Sit to Stand: Max assist,+2 physical assistance,+2 safety/equipment General transfer comment: completed x4 through session with maxA of 2 to power up and steady in standing. blocking of RLE to maintain. Ambulation/Gait General Gait Details: pt unable to achieve any wt shift or safely attempt steps/pivot at this time    ADL: ADL Overall ADL's : Needs assistance/impaired Eating/Feeding: Total assistance Grooming: Maximal assistance,Wash/dry face,Bed level Upper Body Bathing: Total assistance,Bed level Lower Body Bathing: Total assistance Lower Body Bathing Details (indicate cue type and reason): max A+2 sit<>stand Upper Body Dressing : Total assistance,Sitting Lower Body Dressing: Total assistance Lower Body Dressing Details (indicate cue type and reason): max A +2 sit<>stand Toilet Transfer: Maximal assistance,+2 for physical assistance Toilet Transfer Details (indicate cue type and reason): sit<>stand with having RN change out bed for chair behind him Toileting- Clothing Manipulation and Hygiene: Total assistance Toileting - Clothing Manipulation Details (indicate cue type and reason): max A +2 sit<>stand  Cognition: Cognition Overall Cognitive Status: Impaired/Different from baseline Arousal/Alertness: Awake/alert Orientation Level: Oriented to person Attention: Sustained Sustained Attention: Impaired Sustained Attention Impairment: Verbal basic,Functional basic Memory:  (will continue to assess) Awareness: Impaired Awareness Impairment: Anticipatory impairment,Intellectual impairment,Emergent impairment Problem Solving: Impaired Problem Solving Impairment: Functional basic Executive Function: Self Monitoring,Self Correcting Self Monitoring: Impaired Self Correcting: Impaired Behaviors:  Restless,Lability Safety/Judgment: Impaired Cognition Arousal/Alertness: Awake/alert Behavior During Therapy: Restless,WFL for tasks assessed/performed Overall Cognitive Status: Impaired/Different from baseline Area of Impairment: Orientation,Attention,Memory,Following commands,Safety/judgement,Awareness,Problem solving Orientation Level: Disoriented to,Place,Time Current Attention Level: Focused Memory: Decreased recall of precautions,Decreased short-term memory Following Commands: Follows one step commands inconsistently,Follows one step commands with increased time Safety/Judgement: Decreased awareness of safety,Decreased awareness of deficits Awareness:  (R side neglect/inattention) Problem Solving: Slow processing,Decreased initiation,Difficulty sequencing,Requires verbal cues,Requires tactile cues General Comments: pt able to state some coherent thoughts about situation "the doctor tried to fix my brain" but was generally tangential and highly distractable which limited full assessment of orientation and awareness. Pt with R-sided neglect/inattention but does respond to verbal stimuli in R visual field with repeated cues/prompts and can attend to R side for short periods of time   Blood pressure (!) 142/81, pulse 93, temperature 98.2 F (36.8 C), temperature source Oral, resp. rate 16, height 6\' 3"  (1.905 m), weight (!) 136.4 kg, SpO2 94 %. Physical Exam Vitals and nursing note reviewed.  Constitutional:      Appearance: Normal appearance.     Comments: Flushed appearing. [place], waist belt, bilateral wrist restriants and left mitten in place.   NO mitten when I saw him- gurgly voice- large man  Sitting up in bed; occ hacking cough, confused/aphasic, NAD  HENT:     Head:     Comments: L posterior craniotomy incision- hair growing back Cortrak in nose Smile appears equal- spontnaeous    Right Ear: External ear normal.     Left Ear: External ear normal.     Nose: Nose normal. No  congestion.     Mouth/Throat:     Mouth: Mucous membranes are dry.     Pharynx: Oropharynx is clear. No oropharyngeal exudate.  Eyes:  General:        Right eye: No discharge.        Left eye: No discharge.     Comments: Couldn't comply with  CN exam  Cardiovascular:     Rate and Rhythm: Regular rhythm. Tachycardia present.     Heart sounds: Normal heart sounds. No murmur heard.     Comments: HR 100-110- tachycardic- regular rate Pulmonary:     Comments: Coarse with rhonchi decreased at bases B/L; clearing throat constantly- a lot of upper airway sounds too Abdominal:     Comments: Soft, NT, ND, (+)BS    Genitourinary:    Comments: Condom cath- light amber urine  Musculoskeletal:     Cervical back: Normal range of motion. No rigidity.     Comments: Pt could not comply with exam- also apraxic Weaker on R in grip ~/25; L at least 3/5 But wiggles toes/crosses legs, but couldn't do actual exam  Skin:    General: Skin is warm and dry.     Comments: No skin breakdown seen  Neurological:     Mental Status: He is alert.     Comments: Left inattention. Wet voice with rambling speech. He was able to state name but unable to answer any questions.   L inattention Tried to assess sensation not able to - is apraxic, aphasic.   Psychiatric:     Comments: Flat, confused affect     Results for orders placed or performed during the hospital encounter of 02/11/20 (from the past 24 hour(s))  Glucose, capillary     Status: None   Collection Time: 03/06/20  3:48 PM  Result Value Ref Range   Glucose-Capillary 92 70 - 99 mg/dL  Glucose, capillary     Status: Abnormal   Collection Time: 03/06/20  7:59 PM  Result Value Ref Range   Glucose-Capillary 107 (H) 70 - 99 mg/dL  Glucose, capillary     Status: Abnormal   Collection Time: 03/06/20 11:18 PM  Result Value Ref Range   Glucose-Capillary 109 (H) 70 - 99 mg/dL  Glucose, capillary     Status: Abnormal   Collection Time: 03/07/20  3:30  AM  Result Value Ref Range   Glucose-Capillary 120 (H) 70 - 99 mg/dL  CBC with Differential/Platelet     Status: Abnormal   Collection Time: 03/07/20  6:22 AM  Result Value Ref Range   WBC 7.7 4.0 - 10.5 K/uL   RBC 4.69 4.22 - 5.81 MIL/uL   Hemoglobin 13.9 13.0 - 17.0 g/dL   HCT 42.8 39.0 - 52.0 %   MCV 91.3 80.0 - 100.0 fL   MCH 29.6 26.0 - 34.0 pg   MCHC 32.5 30.0 - 36.0 g/dL   RDW 13.4 11.5 - 15.5 %   Platelets 436 (H) 150 - 400 K/uL   nRBC 0.0 0.0 - 0.2 %   Neutrophils Relative % 61 %   Neutro Abs 4.8 1.7 - 7.7 K/uL   Lymphocytes Relative 16 %   Lymphs Abs 1.2 0.7 - 4.0 K/uL   Monocytes Relative 11 %   Monocytes Absolute 0.8 0.1 - 1.0 K/uL   Eosinophils Relative 10 %   Eosinophils Absolute 0.8 (H) 0.0 - 0.5 K/uL   Basophils Relative 1 %   Basophils Absolute 0.1 0.0 - 0.1 K/uL   Immature Granulocytes 1 %   Abs Immature Granulocytes 0.10 (H) 0.00 - 0.07 K/uL  Comprehensive metabolic panel     Status: Abnormal   Collection Time: 03/07/20  6:22 AM  Result Value Ref Range   Sodium 138 135 - 145 mmol/L   Potassium 3.9 3.5 - 5.1 mmol/L   Chloride 102 98 - 111 mmol/L   CO2 25 22 - 32 mmol/L   Glucose, Bld 128 (H) 70 - 99 mg/dL   BUN 17 6 - 20 mg/dL   Creatinine, Ser 0.57 (L) 0.61 - 1.24 mg/dL   Calcium 8.3 (L) 8.9 - 10.3 mg/dL   Total Protein 6.8 6.5 - 8.1 g/dL   Albumin 2.6 (L) 3.5 - 5.0 g/dL   AST 47 (H) 15 - 41 U/L   ALT 117 (H) 0 - 44 U/L   Alkaline Phosphatase 141 (H) 38 - 126 U/L   Total Bilirubin 0.8 0.3 - 1.2 mg/dL   GFR, Estimated >60 >60 mL/min   Anion gap 11 5 - 15  Magnesium     Status: None   Collection Time: 03/07/20  6:22 AM  Result Value Ref Range   Magnesium 2.1 1.7 - 2.4 mg/dL  Glucose, capillary     Status: Abnormal   Collection Time: 03/07/20  7:54 AM  Result Value Ref Range   Glucose-Capillary 116 (H) 70 - 99 mg/dL  Glucose, capillary     Status: Abnormal   Collection Time: 03/07/20 11:16 AM  Result Value Ref Range   Glucose-Capillary 125 (H)  70 - 99 mg/dL   DG CHEST PORT 1 VIEW  Result Date: 03/06/2020 CLINICAL DATA:  Fever. EXAM: PORTABLE CHEST 1 VIEW COMPARISON:  February 29, 2020 FINDINGS: The ET tube is been removed. The feeding tube terminates below today's film. The cardiomediastinal silhouette is stable. No pneumothorax. Mild opacity in the medial right lung base. No other abnormalities. IMPRESSION: Mild opacity in the medial right lung base could represent atelectasis, vascular crowding, or developing infiltrate. No other acute abnormalities. Electronically Signed   By: Dorise Bullion III M.D   On: 03/06/2020 12:52   VAS [place] UPPER EXTREMITY VENOUS DUPLEX  Result Date: 03/06/2020 UPPER VENOUS STUDY  Indications: Edema, and Mid line in right arm Comparison Study: No prior study Performing Technologist: Sharion Dove RVS  Examination Guidelines: A complete evaluation includes B-mode imaging, spectral Doppler, color Doppler, and power Doppler as needed of all accessible portions of each vessel. Bilateral testing is considered an integral part of a complete examination. Limited examinations for reoccurring indications may be performed as noted.  Right Findings: +----------+------------+---------+-----------+----------+--------------------+ RIGHT     CompressiblePhasicitySpontaneousProperties      Summary        +----------+------------+---------+-----------+----------+--------------------+ IJV                      Yes       Yes                                   +----------+------------+---------+-----------+----------+--------------------+ Subclavian               Yes       Yes                                   +----------+------------+---------+-----------+----------+--------------------+ Axillary                 Yes       Yes                                   +----------+------------+---------+-----------+----------+--------------------+  Brachial      Full       Yes       Yes                                    +----------+------------+---------+-----------+----------+--------------------+ Radial        Full                                                       +----------+------------+---------+-----------+----------+--------------------+ Ulnar         Full                                                       +----------+------------+---------+-----------+----------+--------------------+ Cephalic      None       No        No                 Acute, mid-line                                                              present        +----------+------------+---------+-----------+----------+--------------------+ Basilic       Full                                                       +----------+------------+---------+-----------+----------+--------------------+  Summary:  Right: Findings consistent with acute superficial vein thrombosis involving the right cephalic vein.  *See table(s) above for measurements and observations.    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 Does the need for close, 24 hr/day medical supervision in concert with the patient's rehab needs make it unreasonable for this patient to be served in a less intensive setting? Yes 2. Co-Morbidities requiring supervision/potential complications: NPO- dysphagia/TFs by Cortrak, s/p MRSA HCAP- on contact precautions, GMB as above 3. Due to bladder management, bowel management, safety, skin/wound care, disease management, medication administration, pain management and patient education, does the patient require 24 hr/day rehab nursing? Yes 4. Does the patient require coordinated care of a physician, rehab nurse, therapy disciplines of PT, OT, and SLP to address physical and functional deficits in the context of the above medical diagnosis(es)? Yes Addressing deficits in the following areas: balance, endurance, locomotion,  strength, transferring, bowel/bladder control, bathing, dressing, feeding, grooming, toileting, cognition, speech, language and swallowing 5. Can the patient actively participate in an intensive therapy program of at least 3 hrs of therapy per day at least 5 days per week? Yes 6. The potential for patient to make measurable gains while on inpatient rehab is good and fair 7. Anticipated functional outcomes upon discharge from inpatient rehab are supervision and min assist  with PT, supervision and min assist with OT, min assist and mod assist with SLP. 8. Estimated rehab length of stay to reach the above functional goals is: 2-3 weeks 9. Anticipated discharge destination: Home 10. Overall Rehab/Functional Prognosis: good and fair  RECOMMENDATIONS: This patient's condition is appropriate for continued rehabilitative care in the following setting: CIR Patient has agreed to participate in recommended program. Potentially Note that insurance prior authorization may be required for reimbursement for recommended care.  Comment:  1. NPO due to dysphagia- is gurgly 2.  LBM 12/20- however was a long time prior per nurse-  3. Might benefit from Amantadine for aphasia- 100 mg  Daily x 4 days, then 200 mg daily 4. Is tachycardic- per primary team.  5. Will submit for possible rehab admission- since possible GBM, might need treatment first- will see.  6. Thank you for this consult  Bary Leriche, PA-C 03/07/2020   I have personally performed a face to face diagnostic evaluation of this patient and formulated the key components of the plan.  Additionally, I have personally reviewed laboratory data, imaging studies, as well as relevant notes and concur with the physician assistant's documentation above.

## 2020-03-07 NOTE — Plan of Care (Signed)
  Problem: Education: Goal: Knowledge of General Education information will improve Description: Including pain rating scale, medication(s)/side effects and non-pharmacologic comfort measures Outcome: Progressing   Problem: Health Behavior/Discharge Planning: Goal: Ability to manage health-related needs will improve Outcome: Progressing   Problem: Clinical Measurements: Goal: Ability to maintain clinical measurements within normal limits will improve Outcome: Progressing Goal: Will remain free from infection Outcome: Progressing Goal: Diagnostic test results will improve Outcome: Progressing Goal: Respiratory complications will improve Outcome: Progressing Goal: Cardiovascular complication will be avoided Outcome: Progressing   Problem: Activity: Goal: Risk for activity intolerance will decrease Outcome: Progressing   Problem: Nutrition: Goal: Adequate nutrition will be maintained Outcome: Progressing   Problem: Coping: Goal: Level of anxiety will decrease Outcome: Progressing   Problem: Elimination: Goal: Will not experience complications related to bowel motility Outcome: Progressing Goal: Will not experience complications related to urinary retention Outcome: Progressing   Problem: Pain Managment: Goal: General experience of comfort will improve Outcome: Progressing   Problem: Safety: Goal: Ability to remain free from injury will improve Outcome: Progressing   Problem: Skin Integrity: Goal: Risk for impaired skin integrity will decrease Outcome: Progressing   Problem: Activity: Goal: Ability to tolerate increased activity will improve Outcome: Progressing   Problem: Respiratory: Goal: Ability to maintain a clear airway and adequate ventilation will improve Outcome: Progressing   Problem: Role Relationship: Goal: Method of communication will improve Outcome: Progressing   Problem: Safety: Goal: Non-violent Restraint(s) Outcome: Progressing    Problem: Safety: Goal: Non-violent Restraint(s) Outcome: Progressing   

## 2020-03-07 NOTE — Progress Notes (Signed)
Consult Progress Note    Antonio Glenn   LEX:[id number]  DOB: 04/17/1960  DOA: 02/11/2020     24  PCP: Leonard Downing, MD   Hospital Course: Pt. with PMH of obesity; admitted on 02/11/2020, with complaint of seizures and confusion, was found to have brain mass SP craniotomy on 12/1//2021.  Patient has been ICU since that. After extubation patient was referred to Hospitalist for medical management. 11/25 admission for seizures 12/1 craniotomy of the left parietal tumor, remains intubated 12/3 core track insertion 12/4 LTM EEG no seizures but epileptogenicity from left temporal region 12/5 prelim pathology report GBM, sent to [facility] for further evaluation, report not available. 12/9 family meeting and conversation with DNR and no intubation. 12/15 extubated to room air 12/17 PCCM signed off, TRH assuming medical care, low-grade fever, resolved on its own. Daughter converted patient back to FULL CODE. 12/19 right upper extremity Doppler shows SVT, no DVT.  Currently further plan is monitor for improvement in mentation  Interval History:  Patient seen in bed this am with profuse sweating from forehead and face and down into his gown/chest. Arms were also warm as well as legs. Temp was checked bedside with nursing staff and patient was afebrile. He had worked with speech in the morning as well. He was able to follow some commands and also recognized his daughter in the picture when I held up for him, identifying her as Stage manager. However, per staff he seemed more lethargic compared to how he was previously this morning.  Old records reviewed in assessment of this patient  ROS: Review of systems not obtained due to patient factors. Cognitive impairment  Assessment & Plan: 1.  Acute metabolic encephalopathy Likely polypharmacy, CNS tumor as well as recent surgery. Medications are currently being weaned. Continue to monitor. Continue Seroquel and phenobarbital. Clonidine  added. 12/19 opioids changed from scheduled to as needed.  2.  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 On dexamethasone-management per primary team. Neurology was consulted, currently on Vimpat and phenobarbital for seizures. EEG negative for active seizures.  3.  Acute respiratory failure with hypoxia, POA secondary to encephalopathy. Needed intubation initially. Extubated on 12/15 At risk for reintubation given his ongoing agitation and need for medication to assist with that.  4.  MRSA HAP. Low-grade fever on 12/17, resolved on its own Prior pneumonia, treated with IV vancomycin. Course completed.  5.  Paroxysmal A. Fib Currently rate controlled. Anticoagulation on hold due to CNS mass. Monitor.  6.  Thoracic aortic aneurysm Incidental diagnosis. Repeat CT scan in 6 months.  7.  Goals of care conversation Difficult social situation. Currently patient unable to participate in goals of care conversation. Initially based on the discussion with PCCM and the family patient was DNR Daughter who has POA, has transitioned patient back to full code given his perceived clinical improvement  8.  Hypokalemia. -Replete and recheck as needed  9.  Dysphagia. -Has been on tube feeds -Repeat SLP eval on 03/07/2020, he is advanced to dysphagia 1 with nectar thick liquids  10.  Accelerated hypertension. Blood pressure was elevated to 200. Now improving. Started on clonidine, will continue. Continue Norvasc dose reduced from 10 mg to 5 mg.  As needed hydralazine.  11. Right upper extremity SVT. No evidence of DVT. No need for anticoagulation. Not a candidate for anticoagulation regardless.  12. Pain control. Postoperatively, on scheduled  oxycodone 10 mg 4 times daily.  Now on oxycodone 5 to 10 mg every 6 hours as  needed. In order to avoid patient going into withdrawal will continue scheduled short acting OxyIR 5 mg twice daily for the next 2 days.  13. Low-grade temperature. Intermittent. Likely from SVT. X-ray negative. Monitor.   DVT prophylaxis: Lovenox Code Status: Full   Objective: Blood pressure 135/81, pulse 89, temperature 98.2 F (36.8 C), temperature source Oral, resp. rate 19, height 6\' 3"  (1.905 m), weight (!) 136.4 kg, SpO2 95 %.  Examination: General appearance: chronically ill man laying in bed with obvious diaphoresis all over Head: Normocephalic, without obvious abnormality, atraumatic Eyes: EOMI Lungs: scattered coarse breath sounds, no wheezing Heart: regular rate and rhythm and S1, S2 normal Abdomen: obese, soft, NT, BS present Extremities: no edema Skin: diaphoretic, intact Neurologic: unable to fully evaluate; recognized daughter picture but unable to tell me his name or any orientation questions  Data Reviewed: I have personally reviewed following labs and imaging studies Results for orders placed or performed during the hospital encounter of 02/11/20 (from the past 24 hour(s))  Glucose, capillary     Status: None   Collection Time: 03/06/20  3:48 PM  Result Value Ref Range   Glucose-Capillary 92 70 - 99 mg/dL  Glucose, capillary     Status: Abnormal   Collection Time: 03/06/20  7:59 PM  Result Value Ref Range   Glucose-Capillary 107 (H) 70 - 99 mg/dL  Glucose, capillary     Status: Abnormal   Collection Time: 03/06/20 11:18 PM  Result Value Ref Range   Glucose-Capillary 109 (H) 70 - 99 mg/dL  Glucose, capillary     Status: Abnormal   Collection Time: 03/07/20  3:30 AM  Result Value Ref Range   Glucose-Capillary 120 (H) 70 - 99 mg/dL  CBC with Differential/Platelet     Status: Abnormal   Collection Time: 03/07/20  6:22 AM  Result Value Ref Range   WBC 7.7 4.0 - 10.5 K/uL   RBC 4.69 4.22 - 5.81 MIL/uL   Hemoglobin 13.9 13.0 - 17.0 g/dL   HCT 42.8 39.0  - 52.0 %   MCV 91.3 80.0 - 100.0 fL   MCH 29.6 26.0 - 34.0 pg   MCHC 32.5 30.0 - 36.0 g/dL   RDW 13.4 11.5 - 15.5 %   Platelets 436 (H) 150 - 400 K/uL   nRBC 0.0 0.0 - 0.2 %   Neutrophils Relative % 61 %   Neutro Abs 4.8 1.7 - 7.7 K/uL   Lymphocytes Relative 16 %   Lymphs Abs 1.2 0.7 - 4.0 K/uL   Monocytes Relative 11 %   Monocytes Absolute 0.8 0.1 - 1.0 K/uL   Eosinophils Relative 10 %   Eosinophils Absolute 0.8 (H) 0.0 - 0.5 K/uL   Basophils Relative 1 %   Basophils Absolute 0.1 0.0 - 0.1 K/uL   Immature Granulocytes 1 %   Abs Immature Granulocytes 0.10 (H) 0.00 - 0.07 K/uL  Comprehensive metabolic panel     Status: Abnormal   Collection Time: 03/07/20  6:22 AM  Result Value Ref Range   Sodium 138 135 - 145 mmol/L   Potassium 3.9 3.5 - 5.1 mmol/L   Chloride 102 98 - 111 mmol/L   CO2 25 22 - 32 mmol/L   Glucose, Bld 128 (H) 70 - 99 mg/dL   BUN 17 6 - 20 mg/dL   Creatinine, Ser 0.57 (L) 0.61 - 1.24 mg/dL   Calcium 8.3 (  L) 8.9 - 10.3 mg/dL   Total Protein 6.8 6.5 - 8.1 g/dL   Albumin 2.6 (L) 3.5 - 5.0 g/dL   AST 47 (H) 15 - 41 U/L   ALT 117 (H) 0 - 44 U/L   Alkaline Phosphatase 141 (H) 38 - 126 U/L   Total Bilirubin 0.8 0.3 - 1.2 mg/dL   GFR, Estimated >60 >60 mL/min   Anion gap 11 5 - 15  Magnesium     Status: None   Collection Time: 03/07/20  6:22 AM  Result Value Ref Range   Magnesium 2.1 1.7 - 2.4 mg/dL  Glucose, capillary     Status: Abnormal   Collection Time: 03/07/20  7:54 AM  Result Value Ref Range   Glucose-Capillary 116 (H) 70 - 99 mg/dL  Glucose, capillary     Status: Abnormal   Collection Time: 03/07/20 11:16 AM  Result Value Ref Range   Glucose-Capillary 125 (H) 70 - 99 mg/dL    Recent Results (from the past 240 hour(s))  Culture, respiratory (non-expectorated)     Status: None   Collection Time: 02/29/20  9:30 AM   Specimen: Tracheal Aspirate; Respiratory  Result Value Ref Range Status   Specimen Description TRACHEAL ASPIRATE  Final   Special  Requests NONE  Final   Gram Stain   Final    MODERATE WBC PRESENT,BOTH PMN AND MONONUCLEAR FEW GRAM POSITIVE RODS RARE GRAM POSITIVE COCCI RARE GRAM VARIABLE ROD Performed at [facility], [place] [address], [place], [place] [address]    Culture   Final    ABUNDANT METHICILLIN RESISTANT STAPHYLOCOCCUS AUREUS   Report Status 03/03/2020 FINAL  Final   Organism ID, Bacteria METHICILLIN RESISTANT STAPHYLOCOCCUS AUREUS  Final      Susceptibility   Methicillin resistant staphylococcus aureus - MIC*    CIPROFLOXACIN <=0.5 SENSITIVE Sensitive     ERYTHROMYCIN <=0.25 SENSITIVE Sensitive     GENTAMICIN <=0.5 SENSITIVE Sensitive     OXACILLIN >=4 RESISTANT Resistant     TETRACYCLINE <=1 SENSITIVE Sensitive     VANCOMYCIN 1 SENSITIVE Sensitive     TRIMETH/SULFA <=10 SENSITIVE Sensitive     CLINDAMYCIN <=0.25 SENSITIVE Sensitive     RIFAMPIN <=0.5 SENSITIVE Sensitive     Inducible Clindamycin NEGATIVE Sensitive     * ABUNDANT METHICILLIN RESISTANT STAPHYLOCOCCUS AUREUS     Radiology Studies: DG CHEST PORT 1 VIEW  Result Date: 03/06/2020 CLINICAL DATA:  Fever. EXAM: PORTABLE CHEST 1 VIEW COMPARISON:  February 29, 2020 FINDINGS: The ET tube is been removed. The feeding tube terminates below today's film. The cardiomediastinal silhouette is stable. No pneumothorax. Mild opacity in the medial right lung base. No other abnormalities. IMPRESSION: Mild opacity in the medial right lung base could represent atelectasis, vascular crowding, or developing infiltrate. No other acute abnormalities. Electronically Signed   By: Dorise Bullion III M.D   On: 03/06/2020 12:52   VAS [place] UPPER EXTREMITY VENOUS DUPLEX  Result Date: 03/06/2020 UPPER VENOUS STUDY  Indications: Edema, and Mid line in right arm Comparison Study: No prior study Performing Technologist: Sharion Dove RVS  Examination Guidelines: A complete evaluation includes B-mode imaging, spectral Doppler, color Doppler, and power Doppler as  needed of all accessible portions of each vessel. Bilateral testing is considered an integral part of a complete examination. Limited examinations for reoccurring indications may be performed as noted.  Right Findings: +----------+------------+---------+-----------+----------+--------------------+ RIGHT     CompressiblePhasicitySpontaneousProperties      Summary        +----------+------------+---------+-----------+----------+--------------------+  IJV                      Yes       Yes                                   +----------+------------+---------+-----------+----------+--------------------+ Subclavian               Yes       Yes                                   +----------+------------+---------+-----------+----------+--------------------+ Axillary                 Yes       Yes                                   +----------+------------+---------+-----------+----------+--------------------+ Brachial      Full       Yes       Yes                                   +----------+------------+---------+-----------+----------+--------------------+ Radial        Full                                                       +----------+------------+---------+-----------+----------+--------------------+ Ulnar         Full                                                       +----------+------------+---------+-----------+----------+--------------------+ Cephalic      None       No        No                 Acute, mid-line                                                              present        +----------+------------+---------+-----------+----------+--------------------+ Basilic       Full                                                       +----------+------------+---------+-----------+----------+--------------------+  Summary:  Right: Findings consistent with acute superficial vein thrombosis involving the right cephalic vein.  *See table(s)  above for measurements and observations.    Preliminary    VAS [place] UPPER EXTREMITY VENOUS DUPLEX      DG CHEST PORT 1 VIEW  Final Result    DG Abd 1 View  Final Result    DG Chest [facility] 1 View  Final Result    DG Chest Port 1 View  Final Result    DG CHEST PORT 1 VIEW  Final Result    CT CHEST W CONTRAST  Final Result    DG CHEST PORT 1 VIEW  Final Result    MR BRAIN W WO CONTRAST  Final Result    DG CHEST PORT 1 VIEW  Final Result    DG Abd Portable 1V  Final Result    [place] EKG SITE RITE  Final Result    CT HEAD W & WO CONTRAST  Final Result    DG Chest Port 1 View  Final Result      Scheduled Meds: . amLODipine  5 mg Per Tube Daily  . bethanechol  10 mg Per Tube TID  . Chlorhexidine Gluconate Cloth  6 each Topical Daily  . cloNIDine  0.1 mg Per Tube Daily  . docusate  100 mg Per Tube BID  . enoxaparin (LOVENOX) injection  40 mg Subcutaneous Q24H  . feeding supplement (PROSource TF)  90 mL Per Tube TID  . insulin aspart  0-15 Units Subcutaneous Q4H  . lacosamide  100 mg Per Tube BID  . oxyCODONE  5 mg Per Tube BID  . pantoprazole sodium  40 mg Per Tube Daily  . PHENObarbital  90 mg Per Tube BID  . QUEtiapine  200 mg Per Tube BID   PRN Meds: sodium chloride, acetaminophen **OR** acetaminophen, bisacodyl, diazepam, hydrALAZINE, ondansetron **OR** ondansetron (ZOFRAN) IV, oxyCODONE, polyethylene glycol, promethazine, Resource ThickenUp Clear Continuous Infusions: . sodium chloride Stopped (02/26/20 2221)  . sodium chloride Stopped (03/02/20 1734)  . feeding supplement (OSMOLITE 1.5 CAL) 55 mL/hr at 03/07/20 1200     LOS: 25 days  Time spent: Greater than 50% of the 35 minute visit was spent in counseling/coordination of care for the patient as laid out in the A&P.   Dwyane Dee, MD [facility] 03/07/2020, 2:07 PM

## 2020-03-07 NOTE — Progress Notes (Signed)
Physical Therapy Treatment Patient Details Name: Antonio Glenn MRN: [id number] DOB: May 18, 1960 Today's Date: 03/07/2020    History of Present Illness Antonio Glenn is a 59 y.o. male with hx of recently diagnosed brain tumor who was transferred from [place] ED to [facility] 11/25 with AMS and seizures.CRANIOTOMY OF  LEFT PARIETAL TUMOR 12/1 and on vent, trach off vent 12/15    PT Comments    The pt was agreeable to PT session with focus on exercises and bed mobility. The pt was pleasant and agreeable, but unable to answer any questions regarding orientation or complete any voluntary movements to command. The pt was ale to initiate muscle activation with general exercises for BLE and BUE, but was unable to use functionally to pull to sit in the bed. The pt will continue to benefit from skilled PT to progress functional strength, orientation, coordination of movements, and continued progression for OOB mobility.     Follow Up Recommendations  CIR     Equipment Recommendations  Other (comment) (defer to post acute)    Recommendations for Other Services       Precautions / Restrictions Precautions Precautions: Fall Precaution Comments: foley, BUE restraints, mitts, and trunk restraint Restrictions Weight Bearing Restrictions: No    Mobility  Bed Mobility Overal bed mobility: Needs Assistance Bed Mobility: Rolling Rolling: Max assist;+2 for physical assistance;+2 for safety/equipment         General bed mobility comments: significant assist to roll and complete repositioning in bed due to confusion and lack of command following. further mobility deferred due to lack of assistance to allow for safe mobility    Balance Overall balance assessment: Needs assistance Sitting-balance support: Single extremity supported;Feet supported Sitting balance-Leahy Scale: Poor Sitting balance - Comments: mod/maxA to maintain static sitting                                     Cognition Arousal/Alertness: Awake/alert (initially asleep but awoke with transition to sitting) Behavior During Therapy: Restless;WFL for tasks assessed/performed Overall Cognitive Status: Impaired/Different from baseline Area of Impairment: Orientation;Attention;Memory;Following commands;Safety/judgement;Awareness;Problem solving                 Orientation Level: Disoriented to;Place;Time Current Attention Level: Focused Memory: Decreased recall of precautions;Decreased short-term memory Following Commands: Follows one step commands inconsistently;Follows one step commands with increased time Safety/Judgement: Decreased awareness of safety;Decreased awareness of deficits   Problem Solving: Slow processing;Decreased initiation;Difficulty sequencing;Requires verbal cues;Requires tactile cues General Comments: Pt unable to answer orientation questions such as "are we at your house?" or questions such as "what color is my glove" even when given extra time or answer choices. unsure how well pt is tracking conversation, but he did recognize his brother      Exercises General Exercises - Upper Extremity Shoulder Flexion: AAROM;Both;10 reps;Seated Shoulder Horizontal ABduction: AAROM;Both;10 reps;Seated Elbow Flexion: AAROM;Both;10 reps;Seated Elbow Extension: AAROM;Both;10 reps;Seated General Exercises - Lower Extremity Ankle Circles/Pumps: AAROM;Both;15 reps;Supine Short Arc Quad: AAROM;Both;10 reps;Seated Hip ABduction/ADduction: AAROM;Both;10 reps;Supine    General Comments General comments (skin integrity, edema, etc.): VSS through session. Pt unable to follow any commands related to mobility or exercises during session. Pt's brother arrived during session which caused pt to become tearful, stating he would like to spend time with his brother      Pertinent Vitals/Pain Pain Assessment: Faces Faces Pain Scale: Hurts a little bit Pain Location: generalized grimacing Pain  Descriptors /  Indicators: Grimacing Pain Intervention(s): Limited activity within patient's tolerance;Monitored during session;Repositioned           PT Goals (current goals can now be found in the care plan section) Acute Rehab PT Goals Patient Stated Goal: to get up PT Goal Formulation: With patient Time For Goal Achievement: 03/18/20 Potential to Achieve Goals: Fair Progress towards PT goals: Progressing toward goals    Frequency    Min 4X/week      PT Plan Current plan remains appropriate       AM-PAC PT "6 Clicks" Mobility   Outcome Measure  Help needed turning from your back to your side while in a flat bed without using bedrails?: A Lot Help needed moving from lying on your back to sitting on the side of a flat bed without using bedrails?: A Lot Help needed moving to and from a bed to a chair (including a wheelchair)?: Total Help needed standing up from a chair using your arms (e.g., wheelchair or bedside chair)?: A Lot Help needed to walk in hospital room?: Total Help needed climbing 3-5 steps with a railing? : Total 6 Click Score: 9    End of Session   Activity Tolerance: Patient tolerated treatment well Patient left: in bed;with call bell/phone within reach;with restraints reapplied;with family/visitor present Nurse Communication: Mobility status;Need for lift equipment PT Visit Diagnosis: Unsteadiness on feet (R26.81);Other abnormalities of gait and mobility (R26.89);Muscle weakness (generalized) (M62.81);Hemiplegia and hemiparesis Hemiplegia - Right/Left: Right Hemiplegia - dominant/non-dominant: Dominant Hemiplegia - caused by: Unspecified     Time: 3557-3220 PT Time Calculation (min) (ACUTE ONLY): 23 min  Charges:  $Therapeutic Exercise: 8-22 mins $Therapeutic Activity: 8-22 mins                     Karma Ganja, PT, DPT   Acute Rehabilitation Department Pager #: [phone]   Otho Bellows 03/07/2020, 6:13 PM

## 2020-03-08 DIAGNOSIS — G9340 Encephalopathy, unspecified: Secondary | ICD-10-CM | POA: Diagnosis not present

## 2020-03-08 DIAGNOSIS — Y95 Nosocomial condition: Secondary | ICD-10-CM | POA: Diagnosis not present

## 2020-03-08 DIAGNOSIS — J189 Pneumonia, unspecified organism: Secondary | ICD-10-CM | POA: Diagnosis not present

## 2020-03-08 DIAGNOSIS — G9389 Other specified disorders of brain: Secondary | ICD-10-CM | POA: Diagnosis not present

## 2020-03-08 LAB — COMPREHENSIVE METABOLIC PANEL
ALT: 113 U/L — ABNORMAL HIGH (ref 0–44)
AST: 42 U/L — ABNORMAL HIGH (ref 15–41)
Albumin: 2.6 g/dL — ABNORMAL LOW (ref 3.5–5.0)
Alkaline Phosphatase: 143 U/L — ABNORMAL HIGH (ref 38–126)
Anion gap: 11 (ref 5–15)
BUN: 19 mg/dL (ref 6–20)
CO2: 24 mmol/L (ref 22–32)
Calcium: 8.7 mg/dL — ABNORMAL LOW (ref 8.9–10.3)
Chloride: 103 mmol/L (ref 98–111)
Creatinine, Ser: 0.61 mg/dL (ref 0.61–1.24)
GFR, Estimated: >60 mL/min (ref >60)
Glucose, Bld: 119 mg/dL — ABNORMAL HIGH (ref 70–99)
Potassium: 3.9 mmol/L (ref 3.5–5.1)
Sodium: 138 mmol/L (ref 135–145)
Total Bilirubin: 0.7 mg/dL (ref 0.3–1.2)
Total Protein: 6.5 g/dL (ref 6.5–8.1)

## 2020-03-08 LAB — CBC WITH DIFFERENTIAL/PLATELET
Abs Immature Granulocytes: 0.15 10*3/uL — ABNORMAL HIGH (ref 0.00–0.07)
Basophils Absolute: 0.1 10*3/uL (ref 0.0–0.1)
Basophils Relative: 1 %
Eosinophils Absolute: 0.8 10*3/uL — ABNORMAL HIGH (ref 0.0–0.5)
Eosinophils Relative: 10 %
HCT: 43.5 % (ref 39.0–52.0)
Hemoglobin: 14.1 g/dL (ref 13.0–17.0)
Immature Granulocytes: 2 %
Lymphocytes Relative: 21 %
Lymphs Abs: 1.6 10*3/uL (ref 0.7–4.0)
MCH: 30.1 pg (ref 26.0–34.0)
MCHC: 32.4 g/dL (ref 30.0–36.0)
MCV: 92.9 fL (ref 80.0–100.0)
Monocytes Absolute: 0.9 10*3/uL (ref 0.1–1.0)
Monocytes Relative: 11 %
Neutro Abs: 4.4 10*3/uL (ref 1.7–7.7)
Neutrophils Relative %: 55 %
Platelets: 431 10*3/uL — ABNORMAL HIGH (ref 150–400)
RBC: 4.68 MIL/uL (ref 4.22–5.81)
RDW: 13.5 % (ref 11.5–15.5)
WBC: 7.9 10*3/uL (ref 4.0–10.5)
nRBC: 0 % (ref 0.0–0.2)

## 2020-03-08 LAB — GLUCOSE, CAPILLARY
Glucose-Capillary: 100 mg/dL — ABNORMAL HIGH (ref 70–99)
Glucose-Capillary: 126 mg/dL — ABNORMAL HIGH (ref 70–99)
Glucose-Capillary: 121 mg/dL — ABNORMAL HIGH (ref 70–99)
Glucose-Capillary: 106 mg/dL — ABNORMAL HIGH (ref 70–99)
Glucose-Capillary: 98 mg/dL (ref 70–99)

## 2020-03-08 LAB — MAGNESIUM: Magnesium: 2.1 mg/dL (ref 1.7–2.4)

## 2020-03-08 NOTE — Progress Notes (Signed)
Occupational Therapy Treatment Patient Details Name: Antonio Glenn MRN: [id number] DOB: 1961/03/10 Today's Date: 03/08/2020    History of present illness Antonio Glenn is a 59 y.o. male with hx of recently diagnosed brain tumor who was transferred from [place] ED to [facility] 11/25 with AMS and seizures.CRANIOTOMY OF  LEFT PARIETAL TUMOR 12/1 and on vent, trach off vent 12/15   OT comments  Pt with gradual progress towards OT goals. He tolerated sitting EOB and sit<>stand trials at [facility] today. Pt remains with impaired cognition including delayed processing, poor safety awareness and impulsivity and requiring consistent cues/assist throughout session completion. Pt initially requiring totalA (+2) for bed mobility and initial attempts to stand in [place], however as pt able to better process instruction he was able to stand with modA+2 (+3 for safety). Pt does become intermittently agitated especially when increasingly confused, but is redirectable. He will benefit from continued acute OT services and recommend continued OT services at [facility] level after discharge. Will follow.    Follow Up Recommendations  CIR;Supervision/Assistance - 24 hour    Equipment Recommendations  Other (comment) (TBD)          Precautions / Restrictions Precautions Precautions: Fall Precaution Comments: B UE and waist restraints; impulsive Restrictions Weight Bearing Restrictions: No       Mobility Bed Mobility Overal bed mobility: Needs Assistance Bed Mobility: Rolling;Supine to Sit;Sit to Supine Rolling: Total assist;+2 for physical assistance   Supine to sit: Total assist;+2 for physical assistance Sit to supine: Max assist;+2 for physical assistance   General bed mobility comments: Signifianct assist and cues for rolling and supine/sit.  Required assist for bil legs and trunk with bed pad to facilitate.  Limited by ability to follow commands.  Transfers Overall transfer level: Needs  assistance Equipment used: Ambulation equipment used Transfers: Sit to/from Stand Sit to Stand: Mod assist;Max assist;+2 physical assistance;+2 safety/equipment         General transfer comment: Attempted sit to stand x 2 in [place] but pt not assisting.  Pt kept asking "what do you want me to do?" Therapist replied "stand up," but still not following commands.  THerapist then said "get on your feet" and pt verbalized understanding and began to assist.  Pt was then able to stand from elevated bed with max x 2 into [place].  Once in [place] pt impulsively completing 1 stand with mod A of 2 for safety and then 1 stand when cued with mod A of 2 for safety. Once pt initiating - he was able to power up and required assist for safety.    Balance Overall balance assessment: Needs assistance Sitting-balance support: Feet supported;No upper extremity supported;Bilateral upper extremity supported Sitting balance-Leahy Scale: Poor Sitting balance - Comments: Pt initially requiring mod A with posterior lean but with cues and assist to lean forward able to progress to close guarding.  Sat EOB for at least 8 mins throughout session Postural control: Posterior lean;Right lateral lean Standing balance support: Bilateral upper extremity supported Standing balance-Leahy Scale: Poor Standing balance comment: Required UE support; stood in [place] 3 x with mod A of 2 for safety; only standing ~10 seconds each time.                           ADL either performed or assessed with clinical judgement   ADL Overall ADL's : Needs assistance/impaired  Functional mobility during ADLs: Moderate assistance;+2 for physical assistance;+2 for safety/equipment (sit<>stand at [facility] General ADL Comments: pt currently totalA for ADL, partly due to cognitive limitations     Vision   Additional Comments: suspect visual deficits. pt intermittently closing L eye; had much  difficulty tracking towards therapist to look out window, and to locate/look towards his personal blanket (which has pictures on it) draped over recliner while seated EOB   Perception     Praxis      Cognition Arousal/Alertness: Awake/alert Behavior During Therapy: Impulsive;Anxious;Agitated Overall Cognitive Status: Impaired/Different from baseline Area of Impairment: Orientation;Attention;Memory;Following commands;Safety/judgement;Awareness;Problem solving                 Orientation Level: Disoriented to;Place;Time;Situation Current Attention Level: Focused Memory: Decreased recall of precautions;Decreased short-term memory Following Commands: Follows one step commands inconsistently Safety/Judgement: Decreased awareness of safety;Decreased awareness of deficits Awareness: Intellectual Problem Solving: Slow processing;Decreased initiation;Difficulty sequencing;Requires verbal cues;Requires tactile cues General Comments: Pt did get anxious/agitated at times but was able to be calmed. He was impulsive and needed assist of 3 for safety.  Pt requiring increased time and cues for commands and still inconsistent.        Exercises     Shoulder Instructions       General Comments VSS    Pertinent Vitals/ Pain       Pain Assessment: No/denies pain  Home Living                                          Prior Functioning/Environment              Frequency  Min 2X/week        Progress Toward Goals  OT Goals(current goals can now be found in the care plan section)  Progress towards OT goals: Progressing toward goals  Acute Rehab OT Goals Patient Stated Goal: to get up OT Goal Formulation: With patient Time For Goal Achievement: 03/18/20 Potential to Achieve Goals: Good ADL Goals Pt Will Perform Grooming: with min assist Pt Will Perform Upper Body Bathing: with min assist Pt Will Perform Lower Body Bathing: with mod assist Pt Will Perform  Upper Body Dressing: with min assist Pt Will Perform Lower Body Dressing: with mod assist Pt Will Transfer to Toilet: with min assist;ambulating;bedside commode Pt Will Perform Toileting - Clothing Manipulation and hygiene: with mod assist Pt/caregiver will Perform Home Exercise Program: Increased ROM;Increased strength;Right Upper extremity;With written HEP provided Additional ADL Goal #1: Pt will be min A in and OOB for basic ADLs Additional ADL Goal #2: Pt will be able to find 3 out of 5 objects on table in front of him when asked  Plan Discharge plan remains appropriate    Co-evaluation    PT/OT/SLP Co-Evaluation/Treatment: Yes Reason for Co-Treatment: Complexity of the patient's impairments (multi-system involvement);For patient/therapist safety;To address functional/ADL transfers PT goals addressed during session: Mobility/safety with mobility;Balance;Proper use of DME;Strengthening/ROM OT goals addressed during session: ADL's and self-care;Strengthening/ROM      AM-PAC OT "6 Clicks" Daily Activity     Outcome Measure   Help from another person eating meals?: Total Help from another person taking care of personal grooming?: A Lot Help from another person toileting, which includes using toliet, bedpan, or urinal?: A Lot Help from another person bathing (including washing, rinsing, drying)?: A Lot Help from another person to put on and taking off regular  upper body clothing?: Total Help from another person to put on and taking off regular lower body clothing?: Total 6 Click Score: 9    End of Session Equipment Utilized During Treatment: Gait belt  OT Visit Diagnosis: Unsteadiness on feet (R26.81);Other abnormalities of gait and mobility (R26.89);Muscle weakness (generalized) (M62.81);Other symptoms and signs involving cognitive function;Low vision, both eyes (H54.2);Cognitive communication deficit (R41.841)   Activity Tolerance Patient tolerated treatment well;Treatment limited  secondary to agitation   Patient Left in bed;with call bell/phone within reach;with bed alarm set;with restraints reapplied   Nurse Communication Mobility status        Time: 9735-3299 OT Time Calculation (min): 30 min  Charges: OT General Charges $OT Visit: 1 Visit OT Treatments $Therapeutic Activity: 8-22 mins  Lou Cal, OT Acute Rehabilitation Services Pager [phone] Office [place] 03/08/2020, 5:44 PM

## 2020-03-08 NOTE — Progress Notes (Signed)
Subjective: Patient reports no headache  Objective: Vital signs in last 24 hours: Temp:  [97.6 F (36.4 C)-99.2 F (37.3 C)] 98.9 F (37.2 C) (12/21 1639) Pulse Rate:  [90-118] 98 (12/21 1639) Resp:  [15-20] 20 (12/21 1639) BP: (110-140)/(68-94) 127/91 (12/21 1639) SpO2:  [90 %-99 %] 99 % (12/21 1639)  Intake/Output from previous day: 12/20 0701 - 12/21 0700 In: 735 [P.O.:360; NG/GT:375] Out: 1250 [Urine:1250] Intake/Output this shift: No intake/output data recorded.  Incision c/d Awake, alert.  Oriented to person only. Speech meandering, decreased comprehension. FC x 4, mild right side neglect.  Lab Results: Recent Labs    03/07/20 0622 03/08/20 0305  WBC 7.7 7.9  HGB 13.9 14.1  HCT 42.8 43.5  PLT 436* 431*   BMET Recent Labs    03/07/20 0622 03/08/20 0305  NA 138 138  K 3.9 3.9  CL 102 103  CO2 25 24  GLUCOSE 128* 119*  BUN 17 19  CREATININE 0.57* 0.61  CALCIUM 8.3* 8.7*    Studies/Results: No results found.  Assessment/Plan: S/p resection of left parietal tumor - still awaiting path - likely CIR - will likely restart anticoagulation next week   [name] 03/08/2020, 6:09 PM

## 2020-03-08 NOTE — Progress Notes (Signed)
  MEWS turned yellow at 1100 reading due to patient moving around, restless, fidgeting. HR back to normal when patient relaxed and MEWS back to green.  Remains green at this time. Will continue to monitor.   03/08/20 0800 03/08/20 1100 03/08/20 1205  Assess: MEWS Score  Temp 97.6 F (36.4 C) 98.3 F (36.8 C)  --   BP 136/85 110/68  --   Pulse Rate 94 (!) 118  --   ECG Heart Rate 93  --  95  Resp 15  --  16  Level of Consciousness Alert  --   --   SpO2 97 %  --   --   O2 Device Room Air  --   --   Assess: MEWS Score  MEWS Temp 0 0 0  MEWS Systolic 0 0 0  MEWS Pulse 0 2 0  MEWS RR 0 0 0  MEWS LOC 0 0 0  MEWS Score 0 2 0  MEWS Score Color Green Yellow Green  Assess: if the MEWS score is Yellow or Red  Were vital signs taken at a resting state?  --  No  --   Focused Assessment  --  No change from prior assessment  --   Early Detection of Sepsis Score *See Row Information*  --  Low  --     03/08/20 1639  Assess: MEWS Score  Temp 98.9 F (37.2 C)  BP (!) 127/91  Pulse Rate 98  ECG Heart Rate 98  Resp 20  Level of Consciousness Responds to Voice  SpO2 99 %  O2 Device Room Air  Assess: MEWS Score  MEWS Temp 0  MEWS Systolic 0  MEWS Pulse 0  MEWS RR 0  MEWS LOC 1  MEWS Score 1  MEWS Score Color Green  Assess: if the MEWS score is Yellow or Red  Were vital signs taken at a resting state?  --   Focused Assessment  --   Early Detection of Sepsis Score *See Row Information*  --

## 2020-03-08 NOTE — Progress Notes (Signed)
Inpatient Rehabilitation Admissions Coordinator  I spoke with patient's;'s daughter, [name] by phone. PTA patient lived alone and worked for [employer]. Independent. I discussed with his daughter the goals and expectations of a possible CIR admit pending caregiver support is available after any rehab admit. She will check with a friend , Jenny Reichmann, who may can assist and follow up with me for clarification of CIR vs SNF rehab.  Danne Baxter, RN, MSN Rehab Admissions Coordinator [phone] 03/08/2020 12:08 PM

## 2020-03-08 NOTE — Progress Notes (Signed)
Consult Progress Note    Antonio Glenn   EXB:[id number]  DOB: 1960-09-14  DOA: 02/11/2020     25  PCP: Leonard Downing, MD   Hospital Course: Pt. with PMH of obesity; admitted on 02/11/2020, with complaint of seizures and confusion, was found to have brain mass SP craniotomy on 12/1//2021.  Patient has been ICU since that. After extubation patient was referred to Hospitalist for medical management. 11/25 admission for seizures 12/1 craniotomy of the left parietal tumor, remains intubated 12/3 core track insertion 12/4 LTM EEG no seizures but epileptogenicity from left temporal region 12/5 prelim pathology report GBM, sent to [facility] for further evaluation, report not available. 12/9 family meeting and conversation with DNR and no intubation. 12/15 extubated to room air 12/17 PCCM signed off, TRH assuming medical care, low-grade fever, resolved on its own. Daughter converted patient back to FULL CODE. 12/19 right upper extremity Doppler shows SVT, no DVT.  Currently further plan is monitor for improvement in mentation  Interval History:  No events overnight. He is not diaphoretic this morning but he is lying in bed with decreased level of mentation and still not interacting with me or following any commands. Mostly just staring nonpurposefully around the room.  Old records reviewed in assessment of this patient  ROS: Review of systems not obtained due to patient factors. Cognitive impairment  Assessment & Plan: Acute metabolic encephalopathy Likely polypharmacy, CNS tumor as well as recent surgery. Medications are currently being weaned. Continue to monitor. Continue Seroquel and phenobarbital. Clonidine added. 12/19 opioids changed from scheduled to as needed.  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 On  dexamethasone-management per primary team. Neurology was consulted, currently on Vimpat and phenobarbital for seizures. EEG negative for active seizures.  Acute respiratory failure with hypoxia, POA secondary to encephalopathy. Needed intubation initially. Extubated on 12/15 At risk for reintubation given his ongoing agitation and need for medication to assist with that.  MRSA HAP. Low-grade fever on 12/17, resolved on its own Prior pneumonia, treated with IV vancomycin. Course completed.  Paroxysmal A. Fib Currently rate controlled. Anticoagulation on hold due to CNS mass. Monitor.  Thoracic aortic aneurysm Incidental diagnosis. Repeat CT scan in 6 months.  Goals of care conversation Difficult social situation. Currently patient unable to participate in goals of care conversation. Initially based on the discussion with PCCM and the family patient was DNR Daughter who has POA, has transitioned patient back to full code given his perceived clinical improvement  Hypokalemia. -Replete and recheck as needed  Dysphagia. -Has been on tube feeds -Repeat SLP eval, still unsafe for POs due to mentation - continue NPO and TF  Accelerated hypertension. Blood pressure was uncontrolled, now improved and stable Started on clonidine, will continue. Continue Norvasc dose reduced from 10 mg to 5 mg.  As needed hydralazine.  Right upper extremity SVT. No evidence of DVT. No need for anticoagulation. Not a candidate for anticoagulation regardless.  Pain control. Postoperatively, on scheduled oxycodone 10 mg 4 times daily.  Now on oxycodone 5 to 10 mg every 6 hours as needed. In order to avoid patient going into withdrawal will continue scheduled short acting OxyIR 5 mg twice daily for the next 2 days.  Low-grade temperature. Intermittent. Likely from SVT vs central fever  X-ray negative   DVT prophylaxis: Lovenox Code Status: Full   Objective: Blood pressure  (!) 127/91, pulse  98, temperature 98.9 F (37.2 C), temperature source Axillary, resp. rate 20, height 6\' 3"  (1.905 m), weight (!) 136.4 kg, SpO2 99 %.  Examination: General appearance: No further diaphoresis but he remains noninteractive and is just staring around the room nonpurposefully at times Head: Normocephalic, without obvious abnormality, atraumatic Eyes: EOMI Lungs: scattered coarse breath sounds, no wheezing Heart: regular rate and rhythm and S1, S2 normal Abdomen: obese, soft, NT, BS present Extremities: no edema Skin: diaphoretic, intact Neurologic: unable to fully evaluate; does not follow any commands; staring around the room  Data Reviewed: I have personally reviewed following labs and imaging studies Results for orders placed or performed during the hospital encounter of 02/11/20 (from the past 24 hour(s))  Glucose, capillary     Status: Abnormal   Collection Time: 03/07/20  8:57 PM  Result Value Ref Range   Glucose-Capillary 123 (H) 70 - 99 mg/dL  Glucose, capillary     Status: Abnormal   Collection Time: 03/07/20 11:42 PM  Result Value Ref Range   Glucose-Capillary 123 (H) 70 - 99 mg/dL  CBC with Differential/Platelet     Status: Abnormal   Collection Time: 03/08/20  3:05 AM  Result Value Ref Range   WBC 7.9 4.0 - 10.5 K/uL   RBC 4.68 4.22 - 5.81 MIL/uL   Hemoglobin 14.1 13.0 - 17.0 g/dL   HCT 43.5 39.0 - 52.0 %   MCV 92.9 80.0 - 100.0 fL   MCH 30.1 26.0 - 34.0 pg   MCHC 32.4 30.0 - 36.0 g/dL   RDW 13.5 11.5 - 15.5 %   Platelets 431 (H) 150 - 400 K/uL   nRBC 0.0 0.0 - 0.2 %   Neutrophils Relative % 55 %   Neutro Abs 4.4 1.7 - 7.7 K/uL   Lymphocytes Relative 21 %   Lymphs Abs 1.6 0.7 - 4.0 K/uL   Monocytes Relative 11 %   Monocytes Absolute 0.9 0.1 - 1.0 K/uL   Eosinophils Relative 10 %   Eosinophils Absolute 0.8 (H) 0.0 - 0.5 K/uL   Basophils Relative 1 %   Basophils Absolute 0.1 0.0 - 0.1 K/uL   Immature Granulocytes 2 %   Abs Immature Granulocytes  0.15 (H) 0.00 - 0.07 K/uL  Comprehensive metabolic panel     Status: Abnormal   Collection Time: 03/08/20  3:05 AM  Result Value Ref Range   Sodium 138 135 - 145 mmol/L   Potassium 3.9 3.5 - 5.1 mmol/L   Chloride 103 98 - 111 mmol/L   CO2 24 22 - 32 mmol/L   Glucose, Bld 119 (H) 70 - 99 mg/dL   BUN 19 6 - 20 mg/dL   Creatinine, Ser 0.61 0.61 - 1.24 mg/dL   Calcium 8.7 (L) 8.9 - 10.3 mg/dL   Total Protein 6.5 6.5 - 8.1 g/dL   Albumin 2.6 (L) 3.5 - 5.0 g/dL   AST 42 (H) 15 - 41 U/L   ALT 113 (H) 0 - 44 U/L   Alkaline Phosphatase 143 (H) 38 - 126 U/L   Total Bilirubin 0.7 0.3 - 1.2 mg/dL   GFR, Estimated >60 >60 mL/min   Anion gap 11 5 - 15  Magnesium     Status: None   Collection Time: 03/08/20  3:05 AM  Result Value Ref Range   Magnesium 2.1 1.7 - 2.4 mg/dL  Glucose, capillary     Status: Abnormal   Collection Time: 03/08/20  3:54 AM  Result Value Ref Range   Glucose-Capillary 100 (  H) 70 - 99 mg/dL  Glucose, capillary     Status: Abnormal   Collection Time: 03/08/20  7:54 AM  Result Value Ref Range   Glucose-Capillary 126 (H) 70 - 99 mg/dL  Glucose, capillary     Status: Abnormal   Collection Time: 03/08/20 12:18 PM  Result Value Ref Range   Glucose-Capillary 121 (H) 70 - 99 mg/dL  Glucose, capillary     Status: Abnormal   Collection Time: 03/08/20  4:36 PM  Result Value Ref Range   Glucose-Capillary 106 (H) 70 - 99 mg/dL   Comment 1 Notify RN    Comment 2 Document in Chart     Recent Results (from the past 240 hour(s))  Culture, respiratory (non-expectorated)     Status: None   Collection Time: 02/29/20  9:30 AM   Specimen: Tracheal Aspirate; Respiratory  Result Value Ref Range Status   Specimen Description TRACHEAL ASPIRATE  Final   Special Requests NONE  Final   Gram Stain   Final    MODERATE WBC PRESENT,BOTH PMN AND MONONUCLEAR FEW GRAM POSITIVE RODS RARE GRAM POSITIVE COCCI RARE GRAM VARIABLE ROD Performed at [facility], [place] [address],  [place], [place] [id number]    Culture   Final    ABUNDANT METHICILLIN RESISTANT STAPHYLOCOCCUS AUREUS   Report Status 03/03/2020 FINAL  Final   Organism ID, Bacteria METHICILLIN RESISTANT STAPHYLOCOCCUS AUREUS  Final      Susceptibility   Methicillin resistant staphylococcus aureus - MIC*    CIPROFLOXACIN <=0.5 SENSITIVE Sensitive     ERYTHROMYCIN <=0.25 SENSITIVE Sensitive     GENTAMICIN <=0.5 SENSITIVE Sensitive     OXACILLIN >=4 RESISTANT Resistant     TETRACYCLINE <=1 SENSITIVE Sensitive     VANCOMYCIN 1 SENSITIVE Sensitive     TRIMETH/SULFA <=10 SENSITIVE Sensitive     CLINDAMYCIN <=0.25 SENSITIVE Sensitive     RIFAMPIN <=0.5 SENSITIVE Sensitive     Inducible Clindamycin NEGATIVE Sensitive     * ABUNDANT METHICILLIN RESISTANT STAPHYLOCOCCUS AUREUS     Radiology Studies: No results found. VAS [place] UPPER EXTREMITY VENOUS DUPLEX  Final Result    DG CHEST PORT 1 VIEW  Final Result    DG Abd 1 View  Final Result    DG Chest Port 1 View  Final Result    DG Chest Port 1 View  Final Result    DG CHEST PORT 1 VIEW  Final Result    CT CHEST W CONTRAST  Final Result    DG CHEST PORT 1 VIEW  Final Result    MR BRAIN W WO CONTRAST  Final Result    DG CHEST PORT 1 VIEW  Final Result    DG Abd Portable 1V  Final Result    [place] EKG SITE RITE  Final Result    CT HEAD W & WO CONTRAST  Final Result    DG Chest Port 1 View  Final Result      Scheduled Meds: . amLODipine  5 mg Per Tube Daily  . bethanechol  10 mg Per Tube TID  . Chlorhexidine Gluconate Cloth  6 each Topical Daily  . cloNIDine  0.1 mg Per Tube Daily  . docusate  100 mg Per Tube BID  . enoxaparin (LOVENOX) injection  40 mg Subcutaneous Q24H  . feeding supplement (PROSource TF)  90 mL Per Tube TID  . insulin aspart  0-15 Units Subcutaneous Q4H  . lacosamide  100 mg Per Tube BID  . oxyCODONE  5 mg Per Tube BID  . pantoprazole sodium  40 mg Per Tube Daily  . PHENObarbital  90 mg Per Tube BID  .  QUEtiapine  200 mg Per Tube BID   PRN Meds: sodium chloride, acetaminophen **OR** acetaminophen, bisacodyl, diazepam, hydrALAZINE, ondansetron **OR** ondansetron (ZOFRAN) IV, oxyCODONE, polyethylene glycol, promethazine, Resource ThickenUp Clear Continuous Infusions: . sodium chloride Stopped (02/26/20 2221)  . sodium chloride Stopped (03/02/20 1734)  . feeding supplement (OSMOLITE 1.5 CAL) 55 mL/hr at 03/08/20 1000     LOS: 26 days  Time spent: Greater than 50% of the 35 minute visit was spent in counseling/coordination of care for the patient as laid out in the A&P.   Dwyane Dee, MD [facility] 03/08/2020, 6:01 PM

## 2020-03-08 NOTE — Plan of Care (Signed)
Patient slowly progressing except taken off of pureed diet and only to have honey thickened water at times when fully alert.   Problem: Education: Goal: Knowledge of General Education information will improve Description: Including pain rating scale, medication(s)/side effects and non-pharmacologic comfort measures Outcome: Progressing   Problem: Health Behavior/Discharge Planning: Goal: Ability to manage health-related needs will improve Outcome: Progressing   Problem: Clinical Measurements: Goal: Ability to maintain clinical measurements within normal limits will improve Outcome: Progressing Goal: Will remain free from infection Outcome: Progressing Goal: Diagnostic test results will improve Outcome: Progressing Goal: Respiratory complications will improve Outcome: Progressing Goal: Cardiovascular complication will be avoided Outcome: Progressing   Problem: Activity: Goal: Risk for activity intolerance will decrease Outcome: Progressing   Problem: Nutrition: Goal: Adequate nutrition will be maintained Outcome: Progressing   Problem: Coping: Goal: Level of anxiety will decrease Outcome: Progressing   Problem: Elimination: Goal: Will not experience complications related to bowel motility Outcome: Progressing Goal: Will not experience complications related to urinary retention Outcome: Progressing   Problem: Pain Managment: Goal: General experience of comfort will improve Outcome: Progressing   Problem: Safety: Goal: Ability to remain free from injury will improve Outcome: Progressing   Problem: Skin Integrity: Goal: Risk for impaired skin integrity will decrease Outcome: Progressing   Problem: Safety: Goal: Non-violent Restraint(s) Outcome: Progressing   Problem: Safety: Goal: Non-violent Restraint(s) Outcome: Progressing

## 2020-03-08 NOTE — Procedures (Addendum)
Objective Swallowing Evaluation: Type of Study: FEES-Fiberoptic Endoscopic Evaluation of Swallow   Patient Details  Name: Antonio Glenn MRN: [id number] Date of Birth: 01/31/1961  Today's Date: 03/08/2020 Time: SLP Start Time (ACUTE ONLY): 0915 -SLP Stop Time (ACUTE ONLY): 0950  SLP Time Calculation (min) (ACUTE ONLY): 35 min   Past Medical History:  Past Medical History:  Diagnosis Date  . Brain tumor ([place])    Left parietal brain tumor  . PICC (peripherally inserted central catheter) in place 02/14/2020  . Seizure ([place]) 02/14/2020   Past Surgical History:  Past Surgical History:  Procedure Laterality Date  . APPLICATION OF CRANIAL NAVIGATION Left 02/17/2020   Procedure: APPLICATION OF CRANIAL NAVIGATION;  Surgeon: [name], MD;  Location: [facility];  Service: Neurosurgery;  Laterality: Left;  . CRANIOTOMY Left 02/17/2020   Procedure: CRANIOTOMY OF  LEFT PARIETAL TUMOR;  Surgeon: [name], MD;  Location: [facility];  Service: Neurosurgery;  Laterality: Left;   HPI: Antonio Glenn is a 59 y.o. male with hx of recently diagnosed brain tumor (left parietal lobe) who was transferred from [facility] to [facility] 11/25 with AMS and seizures. Underwent resection 12/1. Intubated 12/1-12/15.   No data recorded   Assessment / Plan / Recommendation  CHL IP CLINICAL IMPRESSIONS 03/08/2020  Clinical Impression Pt demosntrates multiple impairments that impact his ability to protect his airway sufficiently at this time. His mentation has been highly variable, at times seeming obtunded and at others being alert but still severely confused. He is unable to follow commands consistently as well. When taking PO, it takes him several minutes to clear secretions and become appropriately aware of PO intake for safety. Under direct visualization pt has edematous arytenoids and bialteral excresence of true vocal folds (voice only mildly hoarse). There are thin white standing secretions in the glottis  that he makes minimal effort to clear. At his best moment during this FEES he was able to tolerate small sips of honey thick liquids; at his worse he aspirated nectar thick liquids before the swallow with delayed hard cough. By the end of the study, though he was alert, mild residue from puree and honey was sitting in the glottis and subglottis without pt sensation. He had prolonged hard coughing after the study. Await ability to sustain arousal and improved laryngeal sensation prior to resuming diet. For now pt may have teaspoons of honey thick water only and otherwise stay NPO with Cortrak.  SLP Visit Diagnosis Dysphagia, oropharyngeal phase (R13.12);Aphasia (R47.01)  Attention and concentration deficit following --  Frontal lobe and executive function deficit following --  Impact on safety and function Moderate aspiration risk      CHL IP TREATMENT RECOMMENDATION 03/08/2020  Treatment Recommendations Therapy as outlined in treatment plan below     Prognosis 03/08/2020  Prognosis for Safe Diet Advancement Good  Barriers to Reach Goals --  Barriers/Prognosis Comment --    CHL IP DIET RECOMMENDATION 03/08/2020  SLP Diet Recommendations NPO;Honey thick liquids  Liquid Administration via Spoon;Cup  Medication Administration Via alternative means  Compensations Slow rate;Small sips/bites;Clear throat intermittently  Postural Changes Seated upright at 90 degrees      No flowsheet data found.    CHL IP FOLLOW UP RECOMMENDATIONS 03/08/2020  Follow up Recommendations Inpatient Rehab      CHL IP FREQUENCY AND DURATION 03/08/2020  Speech Therapy Frequency (ACUTE ONLY) min 2x/week  Treatment Duration 2 weeks           CHL IP  ORAL PHASE 03/08/2020  Oral Phase WFL  Oral - Pudding Teaspoon --  Oral - Pudding Cup --  Oral - Honey Teaspoon --  Oral - Honey Cup --  Oral - Nectar Teaspoon --  Oral - Nectar Cup --  Oral - Nectar Straw --  Oral - Thin Teaspoon --  Oral - Thin Cup --   Oral - Thin Straw --  Oral - Puree --  Oral - Mech Soft --  Oral - Regular --  Oral - Multi-Consistency --  Oral - Pill --  Oral Phase - Comment --    CHL IP PHARYNGEAL PHASE 03/08/2020  Pharyngeal Phase Impaired  Pharyngeal- Pudding Teaspoon --  Pharyngeal --  Pharyngeal- Pudding Cup --  Pharyngeal --  Pharyngeal- Honey Teaspoon Penetration/Aspiration during swallow;Penetration/Apiration after swallow;Delayed swallow initiation-vallecula;Lateral channel residue;Pharyngeal residue - pyriform  Pharyngeal Material enters airway, remains ABOVE vocal cords and not ejected out  Pharyngeal- Honey Cup Penetration/Aspiration during swallow;Penetration/Apiration after swallow;Delayed swallow initiation-vallecula;Lateral channel residue;Pharyngeal residue - pyriform  Pharyngeal Material enters airway, remains ABOVE vocal cords and not ejected out  Pharyngeal- Nectar Teaspoon --  Pharyngeal --  Pharyngeal- Nectar Cup --  Pharyngeal --  Pharyngeal- Nectar Straw Lateral channel residue;Delayed swallow initiation-pyriform sinuses;Penetration/Aspiration before swallow;Moderate aspiration;Pharyngeal residue - pyriform;Reduced airway/laryngeal closure  Pharyngeal Material enters airway, passes BELOW cords without attempt by patient to eject out (silent aspiration)  Pharyngeal- Thin Teaspoon --  Pharyngeal --  Pharyngeal- Thin Cup --  Pharyngeal --  Pharyngeal- Thin Straw --  Pharyngeal --  Pharyngeal- Puree Penetration/Aspiration during swallow;Penetration/Apiration after swallow;Delayed swallow initiation-vallecula;Lateral channel residue;Pharyngeal residue - pyriform  Pharyngeal Material enters airway, passes BELOW cords without attempt by patient to eject out (silent aspiration);Material enters airway, CONTACTS cords and not ejected out;Material does not enter airway  Pharyngeal- Mechanical Soft Lateral channel residue;Delayed swallow initiation-vallecula  Pharyngeal --  Pharyngeal- Regular NT   Pharyngeal --  Pharyngeal- Multi-consistency NT  Pharyngeal --  Pharyngeal- Pill NT  Pharyngeal --  Pharyngeal Comment --     No flowsheet data found.  Herbie Baltimore, MA CCC-SLP  Acute Rehabilitation Services Pager [phone] Office [phone]  Lynann Beaver 03/08/2020, 10:17 AM

## 2020-03-08 NOTE — Progress Notes (Signed)
Patient intermittently restless. Attempts to pull at NG tube and condom cath. Patient confused, unable to redirect at times. Bilateral soft wrist restraints and Posey lapbelt will remain in place for patient safety, as ordered.  

## 2020-03-08 NOTE — Progress Notes (Signed)
Patient continues to be confused with severe expressive aphasia.  Short term memory impaired.  When wrists out of restraints for care, patient attempting to grab the tubing in his nose.  Patient does not follow instructions well and does not remember after instructing him not to touch the feeding tube.  Patient also restless and very high fall risk.  Soft wrist restraints and soft waist belt remain necessary for the safety of the patient.  Will continue to monitor.

## 2020-03-08 NOTE — Progress Notes (Signed)
Physical Therapy Treatment Patient Details Name: Antonio Glenn MRN: [id number] DOB: [birth year] Today's Date: 03/08/2020    History of Present Illness Antonio Glenn is a 59 y.o. male with hx of recently diagnosed brain tumor who was transferred from [place] ED to [facility] 11/25 with AMS and seizures.CRANIOTOMY OF  LEFT PARIETAL TUMOR 12/1 and on vent, trach off vent 12/15    PT Comments    Pt making good progress today and was able to stand multiple times during session.  He did require assist of 3 for safety due to impulsive and decreased ability to follow commands.  Once pt assisting/initating transfers he demonstrated good strength but needed assist for safety. Pt requiring increased time, facilitation, multi-modal cues, and re-wording of request to participate.   Continue to recommend rehab at discharge.    Follow Up Recommendations  CIR     Equipment Recommendations  Other (comment) (defer to post acute)    Recommendations for Other Services Rehab consult     Precautions / Restrictions Precautions Precautions: Fall Precaution Comments: B UE and waist restraints; impulsive    Mobility  Bed Mobility Overal bed mobility: Needs Assistance Bed Mobility: Rolling;Supine to Sit;Sit to Supine Rolling: Total assist;+2 for physical assistance   Supine to sit: Total assist;+2 for physical assistance Sit to supine: Max assist;+2 for physical assistance   General bed mobility comments: Signifianct assist and cues for rolling and supine/sit.  Required assist for bil legs and trunk with bed pad to facilitate.  Limited by ability to follow commands.  Transfers Overall transfer level: Needs assistance Equipment used: 2 person hand held assist Transfers: Sit to/from Stand Sit to Stand: Mod assist;Max assist;+2 physical assistance;+2 safety/equipment         General transfer comment: Attempted sit to stand x 2 in [place] but pt not assisting.  Pt kept asking "what do you want me to do?"  Therapist replied "stand up," but still not following commands.  THerapist then said "get on your feet" and pt verbalized understanding and began to assist.  Pt was then able to stand from elevated bed with max x 2 into [place].  Once in [place] pt impulsively completing 1 stand with mod A of 2 for safety and then 1 stand when cued with mod A of 2 for safety. Once pt initiating - he was able to power up and required assist for safety.  Ambulation/Gait             General Gait Details: unsafe to attempt - impulsive, unsteady, not following commands   Stairs             Wheelchair Mobility    Modified Rankin (Stroke Patients Only)       Balance Overall balance assessment: Needs assistance Sitting-balance support: Feet supported;No upper extremity supported;Bilateral upper extremity supported Sitting balance-Leahy Scale: Poor Sitting balance - Comments: Pt initially requiring mod A with posterior lean but with cues and assist to lean forward able to progress to close guarding.  Sat EOB for at least 8 mins throughout session   Standing balance support: Bilateral upper extremity supported Standing balance-Leahy Scale: Poor Standing balance comment: Required UE support; stood in [place] 3 x with mod A of 2 for safety; only standing ~10 seconds each time.                            Cognition Arousal/Alertness: Awake/alert Behavior During Therapy: Impulsive;Anxious;Agitated Overall Cognitive Status: Impaired/Different  from baseline Area of Impairment: Orientation;Attention;Memory;Following commands;Safety/judgement;Awareness;Problem solving                 Orientation Level: Disoriented to;Place;Time;Situation Current Attention Level: Focused Memory: Decreased recall of precautions;Decreased short-term memory Following Commands: Follows one step commands inconsistently Safety/Judgement: Decreased awareness of safety;Decreased awareness of deficits Awareness:  Intellectual Problem Solving: Slow processing;Decreased initiation;Difficulty sequencing;Requires verbal cues;Requires tactile cues General Comments: Pt did get anxious/agitated at times but was able to be calmed. He was impulsive and needed assist of 3 for safety.  Pt requiring increased time and cues for commands and still inconsistent.      Exercises      General Comments General comments (skin integrity, edema, etc.): VSS throughout session      Pertinent Vitals/Pain Pain Assessment: No/denies pain    Home Living                      Prior Function            PT Goals (current goals can now be found in the care plan section) Acute Rehab PT Goals Patient Stated Goal: to get up PT Goal Formulation: With patient Time For Goal Achievement: 03/18/20 Potential to Achieve Goals: Fair Progress towards PT goals: Progressing toward goals    Frequency    Min 4X/week      PT Plan Current plan remains appropriate    Co-evaluation PT/OT/SLP Co-Evaluation/Treatment: Yes Reason for Co-Treatment: Complexity of the patient's impairments (multi-system involvement);For patient/therapist safety PT goals addressed during session: Mobility/safety with mobility;Balance;Proper use of DME;Strengthening/ROM OT goals addressed during session: ADL's and self-care;Strengthening/ROM      AM-PAC PT "6 Clicks" Mobility   Outcome Measure  Help needed turning from your back to your side while in a flat bed without using bedrails?: Total Help needed moving from lying on your back to sitting on the side of a flat bed without using bedrails?: Total Help needed moving to and from a bed to a chair (including a wheelchair)?: Total Help needed standing up from a chair using your arms (e.g., wheelchair or bedside chair)?: A Lot Help needed to walk in hospital room?: Total Help needed climbing 3-5 steps with a railing? : Total 6 Click Score: 7    End of Session Equipment Utilized During  Treatment: Gait belt Activity Tolerance: Patient tolerated treatment well Patient left: in bed;with call bell/phone within reach;with restraints reapplied;with bed alarm set Nurse Communication: Mobility status;Need for lift equipment PT Visit Diagnosis: Unsteadiness on feet (R26.81);Other abnormalities of gait and mobility (R26.89);Muscle weakness (generalized) (M62.81);Hemiplegia and hemiparesis Hemiplegia - Right/Left: Right Hemiplegia - dominant/non-dominant: Dominant Hemiplegia - caused by: Unspecified     Time: 7517-0017 PT Time Calculation (min) (ACUTE ONLY): 30 min  Charges:  $Therapeutic Activity: 8-22 mins                     Abran Richard, PT Acute Rehab Services Pager [phone] [facility] [place] 03/08/2020, 3:48 PM

## 2020-03-08 NOTE — Plan of Care (Signed)

## 2020-03-09 DIAGNOSIS — R4182 Altered mental status, unspecified: Secondary | ICD-10-CM | POA: Diagnosis not present

## 2020-03-09 LAB — COMPREHENSIVE METABOLIC PANEL
ALT: 121 U/L — ABNORMAL HIGH (ref 0–44)
AST: 45 U/L — ABNORMAL HIGH (ref 15–41)
Albumin: 2.6 g/dL — ABNORMAL LOW (ref 3.5–5.0)
Alkaline Phosphatase: 156 U/L — ABNORMAL HIGH (ref 38–126)
Anion gap: 10 (ref 5–15)
BUN: 20 mg/dL (ref 6–20)
CO2: 25 mmol/L (ref 22–32)
Calcium: 8.8 mg/dL — ABNORMAL LOW (ref 8.9–10.3)
Chloride: 103 mmol/L (ref 98–111)
Creatinine, Ser: 0.71 mg/dL (ref 0.61–1.24)
GFR, Estimated: >60 mL/min (ref >60)
Glucose, Bld: 114 mg/dL — ABNORMAL HIGH (ref 70–99)
Potassium: 4 mmol/L (ref 3.5–5.1)
Sodium: 138 mmol/L (ref 135–145)
Total Bilirubin: 0.9 mg/dL (ref 0.3–1.2)
Total Protein: 6.3 g/dL — ABNORMAL LOW (ref 6.5–8.1)

## 2020-03-09 LAB — CBC WITH DIFFERENTIAL/PLATELET
Abs Immature Granulocytes: 0.21 10*3/uL — ABNORMAL HIGH (ref 0.00–0.07)
Basophils Absolute: 0.1 10*3/uL (ref 0.0–0.1)
Basophils Relative: 1 %
Eosinophils Absolute: 1 10*3/uL — ABNORMAL HIGH (ref 0.0–0.5)
Eosinophils Relative: 10 %
HCT: 41.7 % (ref 39.0–52.0)
Hemoglobin: 14.1 g/dL (ref 13.0–17.0)
Immature Granulocytes: 2 %
Lymphocytes Relative: 20 %
Lymphs Abs: 1.9 10*3/uL (ref 0.7–4.0)
MCH: 30.5 pg (ref 26.0–34.0)
MCHC: 33.8 g/dL (ref 30.0–36.0)
MCV: 90.1 fL (ref 80.0–100.0)
Monocytes Absolute: 1 10*3/uL (ref 0.1–1.0)
Monocytes Relative: 10 %
Neutro Abs: 5.5 10*3/uL (ref 1.7–7.7)
Neutrophils Relative %: 57 %
Platelets: 476 10*3/uL — ABNORMAL HIGH (ref 150–400)
RBC: 4.63 MIL/uL (ref 4.22–5.81)
RDW: 13.3 % (ref 11.5–15.5)
WBC: 9.8 10*3/uL (ref 4.0–10.5)
nRBC: 0 % (ref 0.0–0.2)

## 2020-03-09 LAB — GLUCOSE, CAPILLARY
Glucose-Capillary: 104 mg/dL — ABNORMAL HIGH (ref 70–99)
Glucose-Capillary: 108 mg/dL — ABNORMAL HIGH (ref 70–99)
Glucose-Capillary: 110 mg/dL — ABNORMAL HIGH (ref 70–99)
Glucose-Capillary: 115 mg/dL — ABNORMAL HIGH (ref 70–99)
Glucose-Capillary: 123 mg/dL — ABNORMAL HIGH (ref 70–99)
Glucose-Capillary: 125 mg/dL — ABNORMAL HIGH (ref 70–99)
Glucose-Capillary: 127 mg/dL — ABNORMAL HIGH (ref 70–99)

## 2020-03-09 LAB — MAGNESIUM: Magnesium: 2 mg/dL (ref 1.7–2.4)

## 2020-03-09 NOTE — Progress Notes (Signed)
Nutrition Follow-up  DOCUMENTATION CODES:   Obesity unspecified  INTERVENTION:   Continue tube feeds via Cortrak: - Osmolite 1.5 @ 55 ml/hr (1320 ml/day) - ProSource TF 90 ml TID  Tube feeding regimen provides 2220 kcal, 148 grams of protein, and 1003 ml of H2O.   NUTRITION DIAGNOSIS:   Inadequate oral intake related to lethargy/confusion as evidenced by NPO status.  Ongoing, being addressed via TF  GOAL:   Patient will meet greater than or equal to 90% of their needs  Met via TF  MONITOR:   Diet advancement,Labs,Weight trends,TF tolerance,I & O's  REASON FOR ASSESSMENT:   Consult Enteral/tube feeding initiation and management  ASSESSMENT:   59 year old male who presented on 11/25 with seizure-like activity. Pt was recently found to have brain mass concerning for neoplasm with plan for left craniotomy on 12/01.  11/29 - Cortrak placed, later pt pulled tube out 12/01 - s/p crani of L parietal tumor  12/03 - Cortrak placed, tip gastric 12/13 - pt vomited, Cortrak repositioned to post-pyloric (4th portion of duodenum) 12/15 - extubated 12/20 - diet advanced to dysphagia 1 with nectar-thick liquids 12/21 - FEES with recommendations for NPO with honey-thick water only  Per notes, pt remains confused at times and is pulling at lines and tubes. Noted possible c/s to CIR.  Pt remains on TF via post-pyloric Cortrak tube. Noted rectal tube removed 12/18. No issues with TF tolerance reported. Will continue with current regimen.  Weight stable over the last week between 300-303 lbs but down from admission weight of 326 lbs. Will continue to monitor and adjust TF regimen as appropriate.  Per RN edema assessment on 12/21, pt with non-pitting generalized edema.  Current TF: Osmolite 1.5 @ 55 ml/hr, ProSource TF 90 ml TID  Medications reviewed and include: colace, SSI q 4 hours, protonix  Labs reviewed: elevated LFTs CBG's: 98-123 x 24 hours  UOP: 700 ml x 24  hours I/O's: +6.7 L since admit  Diet Order:   Diet Order            Diet NPO time specified  Diet effective now                 EDUCATION NEEDS:   No education needs have been identified at this time  Skin:  Skin Assessment: Skin Integrity Issues: Incisions: head  Last BM:  03/08/20 large type 6  Height:   Ht Readings from Last 1 Encounters:  02/17/20 6' 3"  (1.905 m)    Weight:   Wt Readings from Last 1 Encounters:  03/07/20 (!) 136.4 kg    BMI:  Body mass index is 37.59 kg/m.  Estimated Nutritional Needs:   Kcal:  8757-9728  Protein:  120-145 grams  Fluid:  >/= 2.0 L    Gustavus Bryant, MS, RD, LDN Inpatient Clinical Dietitian Please see AMiON for contact information.

## 2020-03-09 NOTE — Progress Notes (Signed)
Subjective: Patient reports no headaches  Objective: Vital signs in last 24 hours: Temp:  [97.9 F (36.6 C)-100 F (37.8 C)] 97.9 F (36.6 C) (12/22 1600) Pulse Rate:  [89-99] 94 (12/22 1600) Resp:  [11-19] 19 (12/22 1600) BP: (109-156)/(77-95) 142/77 (12/22 1600) SpO2:  [95 %-100 %] 99 % (12/22 1600)  Intake/Output from previous day: 12/21 0701 - 12/22 0700 In: 1480.8 [NG/GT:1480.8] Out: 701 [Urine:700; Stool:1] Intake/Output this shift: No intake/output data recorded.  Awake, alert, Ox1.  FC x 4 with some right sided neglect.  Does not follow complex commands.  Lab Results: Recent Labs    03/08/20 0305 03/09/20 0146  WBC 7.9 9.8  HGB 14.1 14.1  HCT 43.5 41.7  PLT 431* 476*   BMET Recent Labs    03/08/20 0305 03/09/20 0146  NA 138 138  K 3.9 4.0  CL 103 103  CO2 24 25  GLUCOSE 119* 114*  BUN 19 20  CREATININE 0.61 0.71  CALCIUM 8.7* 8.8*    Studies/Results: No results found.  Assessment/Plan: 59 yo M with left parietal lobe tumor s/p resection - awaiting CIR vs SNF - cont supportive care  [name] 03/09/2020, 8:20 PM

## 2020-03-09 NOTE — Progress Notes (Signed)
Physical Therapy Treatment Patient Details Name: Antonio Glenn MRN: [id number] DOB: 12/12/1960 Today's Date: 03/09/2020    History of Present Illness Antonio Glenn is a 59 y.o. male with hx of recently diagnosed brain tumor who was transferred from [place] ED to [facility] 11/25 with AMS and seizures.CRANIOTOMY OF  LEFT PARIETAL TUMOR 12/1 and on vent, trach off vent 12/15    PT Comments    Pt progressing slowly toward goals.  Emphasis today, following 1-step commands without cues for approp 25% of time.  About 75% of time with added non verbal cues.  Pt very distractible, needing frequent redirection.  Stress on sit to stand, standing activity in the RW with start of pre gait activity.  Pt still too impulsive and unsafe to sit up in the chair without supervision today.    Follow Up Recommendations  CIR     Equipment Recommendations  Other (comment)    Recommendations for Other Services Rehab consult     Precautions / Restrictions Precautions Precautions: Fall Precaution Comments: B UE and waist restraints; impulsive    Mobility  Bed Mobility Overal bed mobility: Needs Assistance Bed Mobility: Supine to Sit;Sit to Supine     Supine to sit: Max assist;+2 for physical assistance Sit to supine: Max assist;+2 for physical assistance   General bed mobility comments: guidance cues/assist requaired  Transfers Overall transfer level: Needs assistance Equipment used: Rolling walker (2 wheeled) Transfers: Sit to/from Stand Sit to Stand: Mod assist;Max assist;+2 physical assistance         General transfer comment: cues for sequencing and direction.  assist forward and boost.  Stability once upright  Ambulation/Gait             General Gait Details: low amplitude marching in place with w/shift and stability assist with use of the RW.  At end of session, pt assisted to side step left 3 feet with +2 max for w/shift and support.   Stairs             Wheelchair  Mobility    Modified Rankin (Stroke Patients Only)       Balance   Sitting-balance support: Feet supported;No upper extremity supported;Bilateral upper extremity supported Sitting balance-Leahy Scale: Poor Sitting balance - Comments: still biased posteriorly, pt could come forward and sustain midline for a few second, lose focus and start falling back or right again.   Standing balance support: Bilateral upper extremity supported Standing balance-Leahy Scale: Poor Standing balance comment: reliant on RE support, stood x3 in the RW, HHA not safe enough.  Worked on upright stance, w/shifting, progressing to stepping in place                            Cognition Arousal/Alertness: Awake/alert Behavior During Therapy: Impulsive;Restless Overall Cognitive Status: Impaired/Different from baseline Area of Impairment: Orientation;Attention;Memory;Following commands;Safety/judgement;Awareness;Problem solving                 Orientation Level: Situation;Time;Place Current Attention Level: Focused Memory: Decreased recall of precautions;Decreased short-term memory Following Commands: Follows one step commands inconsistently Safety/Judgement: Decreased awareness of safety;Decreased awareness of deficits Awareness: Intellectual Problem Solving: Slow processing;Decreased initiation;Difficulty sequencing;Requires verbal cues;Requires tactile cues        Exercises Other Exercises Other Exercises: general warm up A/AA/resistive ROM to all 4 extremities    General Comments General comments (skin integrity, edema, etc.): vss      Pertinent Vitals/Pain Pain Assessment: Faces Faces Pain Scale:  Hurts a little bit Pain Location: generalized grimacing, coretrak Pain Descriptors / Indicators: Grimacing Pain Intervention(s): Monitored during session    Home Living                      Prior Function            PT Goals (current goals can now be found in the  care plan section) Acute Rehab PT Goals Patient Stated Goal: to get up PT Goal Formulation: With patient Time For Goal Achievement: 03/18/20 Potential to Achieve Goals: Fair Progress towards PT goals: Progressing toward goals    Frequency    Min 4X/week      PT Plan Current plan remains appropriate    Co-evaluation              AM-PAC PT "6 Clicks" Mobility   Outcome Measure  Help needed turning from your back to your side while in a flat bed without using bedrails?: A Lot Help needed moving from lying on your back to sitting on the side of a flat bed without using bedrails?: A Lot Help needed moving to and from a bed to a chair (including a wheelchair)?: A Lot Help needed standing up from a chair using your arms (e.g., wheelchair or bedside chair)?: A Lot Help needed to walk in hospital room?: A Lot Help needed climbing 3-5 steps with a railing? : Total 6 Click Score: 11    End of Session   Activity Tolerance: Patient tolerated treatment well Patient left: in bed;with call bell/phone within reach;with restraints reapplied;with bed alarm set Nurse Communication: Mobility status PT Visit Diagnosis: Unsteadiness on feet (R26.81);Other abnormalities of gait and mobility (R26.89);Muscle weakness (generalized) (M62.81);Hemiplegia and hemiparesis Hemiplegia - Right/Left: Right Hemiplegia - dominant/non-dominant: Dominant Hemiplegia - caused by: Unspecified     Time: 1450-1521 PT Time Calculation (min) (ACUTE ONLY): 31 min  Charges:  $Therapeutic Activity: 8-22 mins $Neuromuscular Re-education: 8-22 mins                     03/09/2020  Ginger Carne., PT Acute Rehabilitation Services [phone]  (pager) [phone]  (office)   Tessie Fass Brydan Downard 03/09/2020, 4:58 PM

## 2020-03-09 NOTE — Progress Notes (Signed)
  Speech Language Pathology Treatment: Dysphagia;Cognitive-Linquistic  Patient Details Name: Antonio Glenn MRN: [id number] DOB: 05-18-1960 Today's Date: 03/09/2020 Time: 8032-1224 SLP Time Calculation (min) (ACUTE ONLY): 27 min  Assessment / Plan / Recommendation Clinical Impression  Pt was restless upon therapist's arrival and was noted to have removed his condom catheter, despite being in wrist and waist restraints.  Bed linens were soiled.  While waiting for assistance from nursing for cleaning pt, therapist facilitated the session with cup sips of honey thick water following oral care to continue working towards initiation of PO diet.  Pt consumed liquids with no overt s/s of aspiration although he did have a somewhat congested cough prior to initiation of PO trials.  When nursing arrived, pt was able to follow commands during bed level hygiene and changing of linens with mod assist verbal cues.   Pt was able to answer basic, immediate, personally relevant yes/no questions with good accuracy but his responses became much more confabulatory and circumlocutory with open ended responses.  Pt repeatedly expressed appreciation to staff during and after hygiene, stating "you guys are so good at what you do," but other spontaneous verbal output was very difficult to understand.  Pt was left with restraints reapplied and bed alarm set.  Continue per current plan of care.    HPI HPI: Antonio Glenn is a 59 y.o. 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 Underwent resection 12/1. Intubated 12/1-12/15.      SLP Plan  Continue with current plan of care       Recommendations  Diet recommendations: NPO;Other(comment) (trials of honey thick water) Liquids provided via: Cup;Straw Medication Administration: Via alternative means Supervision: Staff to assist with self feeding;Full supervision/cueing for  compensatory strategies Compensations: Slow rate;Small sips/bites;Clear throat intermittently Postural Changes and/or Swallow Maneuvers: Seated upright 90 degrees;Out of bed for meals                Oral Care Recommendations: Oral care QID Follow up Recommendations: Inpatient Rehab SLP Visit Diagnosis: Dysphagia, oropharyngeal phase (R13.12);Aphasia (R47.01) Plan: Continue with current plan of care       GO                Kaylah Chiasson, Selinda Orion 03/09/2020, 1:26 PM

## 2020-03-09 NOTE — Plan of Care (Signed)

## 2020-03-09 NOTE — Progress Notes (Signed)
Patient intermittently restless. Attempts to pull at NG tube and condom cath. Patient confused, unable to redirect at times. Bilateral soft wrist restraints and Posey lapbelt will remain in place for patient safety, as ordered.

## 2020-03-09 NOTE — Progress Notes (Signed)
Consult Progress Note    YON SCHIFFMAN   WPY:[id number]  DOB: [birth year]  DOA: 02/11/2020     27  PCP: Leonard Downing, MD   Hospital Course: [name] is a 59 yo male with PMH of obesity; admitted on 02/11/2020, with complaint of seizures and confusion, was found to have brain mass s/p craniotomy on 12/1//2021. After extubation patient was referred to Hospitalist for medical management. 11/25 admission for seizures 12/1 craniotomy of the left parietal tumor, remains intubated 12/3 core track insertion 12/4 LTM EEG no seizures but epileptogenicity from left temporal region 12/5 prelim pathology report GBM, sent to [facility] for further evaluation, report not available. 12/9 family meeting and conversation with DNR and no intubation. 12/15 extubated to room air 12/17 PCCM signed off, TRH assuming medical care, low-grade fever, resolved on its own. Daughter converted patient back to FULL CODE. 12/19 right upper extremity Doppler shows SVT, no DVT.  Currently further plan is monitor for improvement in mentation  Interval History:  Patient remains altered and confused still.  He did say hi to me when I walked in this morning but could not follow any commands.  He continues to not purposefully move around in bed and has required a waist belt and mittens for several days now.  Old records reviewed in assessment of this patient  ROS: Review of systems not obtained due to patient factors. Cognitive impairment  Assessment & Plan: Acute metabolic encephalopathy Likely polypharmacy, CNS tumor as well as recent surgery. Medications are currently being weaned. Continue to monitor. Continue Seroquel and phenobarbital. Clonidine added. 12/19 opioids changed from scheduled to as needed.  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 On dexamethasone-management per primary team. Neurology was consulted, currently on Vimpat and phenobarbital for seizures. EEG negative for active seizures.  Acute respiratory failure with hypoxia, POA secondary to encephalopathy. Needed intubation initially. Extubated on 12/15 At risk for reintubation given his ongoing agitation and need for medication to assist with that.  MRSA HAP. Low-grade fever on 12/17, resolved on its own Prior pneumonia, treated with IV vancomycin. Course completed.  Paroxysmal A. Fib Currently rate controlled. Anticoagulation on hold due to CNS mass. Monitor.  Thoracic aortic aneurysm Incidental diagnosis. Repeat CT scan in 6 months.  Goals of care conversation Difficult social situation. Currently patient unable to participate in goals of care conversation. Initially based on the discussion with PCCM and the family patient was DNR Daughter who has POA, has transitioned patient back to full code given his perceived clinical improvement  Hypokalemia. -Replete and recheck as needed  Dysphagia. -Has been on tube feeds -Repeat SLP eval, still unsafe for POs due to mentation - continue NPO and TF  Accelerated hypertension. Blood pressure was uncontrolled, now improved and stable Started on clonidine, will continue. Continue Norvasc dose reduced from 10 mg to 5 mg.  As needed hydralazine.  Right upper extremity SVT. No evidence of DVT. No need for anticoagulation. Not a candidate for anticoagulation regardless.  Pain control. Now on oxycodone 5-10 q6P; scheduled oxycodone has been discontinued  Low-grade temperature. Intermittent. Likely from SVT vs central fever  X-ray negative   DVT prophylaxis: Lovenox Code Status: Full   Objective: Blood pressure (!) 142/77, pulse 94, temperature 97.9 F (36.6 C), temperature source Oral, resp. rate 19, height 6\' 3"  (1.905 m), weight (!) 136.4 kg, SpO2 99 %.   Examination:  General appearance: No further diaphoresis but he remains noninteractive and is just staring around the room nonpurposefully at times Head: Normocephalic, without obvious abnormality, atraumatic Eyes: EOMI Lungs: scattered coarse breath sounds, no wheezing Heart: regular rate and rhythm and S1, S2 normal Abdomen: obese, soft, NT, BS present Extremities: no edema Skin: diaphoretic, intact Neurologic: unable to fully evaluate; does not follow any commands; staring around the room  Data Reviewed: I have personally reviewed following labs and imaging studies Results for orders placed or performed during the hospital encounter of 02/11/20 (from the past 24 hour(s))  Glucose, capillary     Status: Abnormal   Collection Time: 03/08/20  4:36 PM  Result Value Ref Range   Glucose-Capillary 106 (H) 70 - 99 mg/dL   Comment 1 Notify RN    Comment 2 Document in Chart   Glucose, capillary     Status: None   Collection Time: 03/08/20  7:47 PM  Result Value Ref Range   Glucose-Capillary 98 70 - 99 mg/dL  Glucose, capillary     Status: Abnormal   Collection Time: 03/09/20 12:02 AM  Result Value Ref Range   Glucose-Capillary 104 (H) 70 - 99 mg/dL  CBC with Differential/Platelet     Status: Abnormal   Collection Time: 03/09/20  1:46 AM  Result Value Ref Range   WBC 9.8 4.0 - 10.5 K/uL   RBC 4.63 4.22 - 5.81 MIL/uL   Hemoglobin 14.1 13.0 - 17.0 g/dL   HCT 41.7 39.0 - 52.0 %   MCV 90.1 80.0 - 100.0 fL   MCH 30.5 26.0 - 34.0 pg   MCHC 33.8 30.0 - 36.0 g/dL   RDW 13.3 11.5 - 15.5 %   Platelets 476 (H) 150 - 400 K/uL   nRBC 0.0 0.0 - 0.2 %   Neutrophils Relative % 57 %   Neutro Abs 5.5 1.7 - 7.7 K/uL   Lymphocytes Relative 20 %   Lymphs Abs 1.9 0.7 - 4.0 K/uL   Monocytes Relative 10 %   Monocytes Absolute 1.0 0.1 - 1.0 K/uL   Eosinophils Relative 10 %   Eosinophils Absolute 1.0 (H) 0.0 - 0.5 K/uL   Basophils Relative 1 %   Basophils Absolute 0.1 0.0 - 0.1 K/uL   Immature  Granulocytes 2 %   Abs Immature Granulocytes 0.21 (H) 0.00 - 0.07 K/uL  Comprehensive metabolic panel     Status: Abnormal   Collection Time: 03/09/20  1:46 AM  Result Value Ref Range   Sodium 138 135 - 145 mmol/L   Potassium 4.0 3.5 - 5.1 mmol/L   Chloride 103 98 - 111 mmol/L   CO2 25 22 - 32 mmol/L   Glucose, Bld 114 (H) 70 - 99 mg/dL   BUN 20 6 - 20 mg/dL   Creatinine, Ser 0.71 0.61 - 1.24 mg/dL   Calcium 8.8 (L) 8.9 - 10.3 mg/dL   Total Protein 6.3 (L) 6.5 - 8.1 g/dL   Albumin 2.6 (L) 3.5 - 5.0 g/dL   AST 45 (H) 15 - 41 U/L   ALT 121 (H) 0 - 44 U/L   Alkaline Phosphatase 156 (H) 38 - 126 U/L   Total Bilirubin 0.9 0.3 - 1.2 mg/dL   GFR, Estimated >60 >60 mL/min   Anion gap 10 5 - 15  Magnesium     Status: None   Collection Time: 03/09/20  1:46 AM  Result Value Ref Range   Magnesium 2.0 1.7 - 2.4 mg/dL  Glucose, capillary  Status: Abnormal   Collection Time: 03/09/20  3:41 AM  Result Value Ref Range   Glucose-Capillary 110 (H) 70 - 99 mg/dL  Glucose, capillary     Status: Abnormal   Collection Time: 03/09/20  7:49 AM  Result Value Ref Range   Glucose-Capillary 123 (H) 70 - 99 mg/dL  Glucose, capillary     Status: Abnormal   Collection Time: 03/09/20 11:49 AM  Result Value Ref Range   Glucose-Capillary 127 (H) 70 - 99 mg/dL  Glucose, capillary     Status: Abnormal   Collection Time: 03/09/20  4:02 PM  Result Value Ref Range   Glucose-Capillary 108 (H) 70 - 99 mg/dL    Recent Results (from the past 240 hour(s))  Culture, respiratory (non-expectorated)     Status: None   Collection Time: 02/29/20  9:30 AM   Specimen: Tracheal Aspirate; Respiratory  Result Value Ref Range Status   Specimen Description TRACHEAL ASPIRATE  Final   Special Requests NONE  Final   Gram Stain   Final    MODERATE WBC PRESENT,BOTH PMN AND MONONUCLEAR FEW GRAM POSITIVE RODS RARE GRAM POSITIVE COCCI RARE GRAM VARIABLE ROD Performed at [facility], [place] [address], [place],  [place] [address]    Culture   Final    ABUNDANT METHICILLIN RESISTANT STAPHYLOCOCCUS AUREUS   Report Status 03/03/2020 FINAL  Final   Organism ID, Bacteria METHICILLIN RESISTANT STAPHYLOCOCCUS AUREUS  Final      Susceptibility   Methicillin resistant staphylococcus aureus - MIC*    CIPROFLOXACIN <=0.5 SENSITIVE Sensitive     ERYTHROMYCIN <=0.25 SENSITIVE Sensitive     GENTAMICIN <=0.5 SENSITIVE Sensitive     OXACILLIN >=4 RESISTANT Resistant     TETRACYCLINE <=1 SENSITIVE Sensitive     VANCOMYCIN 1 SENSITIVE Sensitive     TRIMETH/SULFA <=10 SENSITIVE Sensitive     CLINDAMYCIN <=0.25 SENSITIVE Sensitive     RIFAMPIN <=0.5 SENSITIVE Sensitive     Inducible Clindamycin NEGATIVE Sensitive     * ABUNDANT METHICILLIN RESISTANT STAPHYLOCOCCUS AUREUS     Radiology Studies: No results found. VAS [place] UPPER EXTREMITY VENOUS DUPLEX  Final Result    DG CHEST PORT 1 VIEW  Final Result    DG Abd 1 View  Final Result    DG Chest Port 1 View  Final Result    DG Chest Port 1 View  Final Result    DG CHEST PORT 1 VIEW  Final Result    CT CHEST W CONTRAST  Final Result    DG CHEST PORT 1 VIEW  Final Result    MR BRAIN W WO CONTRAST  Final Result    DG CHEST PORT 1 VIEW  Final Result    DG Abd Portable 1V  Final Result    [place] EKG SITE RITE  Final Result    CT HEAD W & WO CONTRAST  Final Result    DG Chest Port 1 View  Final Result      Scheduled Meds: . amLODipine  5 mg Per Tube Daily  . bethanechol  10 mg Per Tube TID  . Chlorhexidine Gluconate Cloth  6 each Topical Daily  . cloNIDine  0.1 mg Per Tube Daily  . docusate  100 mg Per Tube BID  . enoxaparin (LOVENOX) injection  40 mg Subcutaneous Q24H  . feeding supplement (PROSource TF)  90 mL Per Tube TID  . insulin aspart  0-15 Units Subcutaneous Q4H  . lacosamide  100 mg Per Tube BID  .  pantoprazole sodium  40 mg Per Tube Daily  . PHENObarbital  90 mg Per Tube BID  . QUEtiapine  200 mg Per Tube BID   PRN Meds:  sodium chloride, acetaminophen **OR** acetaminophen, bisacodyl, diazepam, hydrALAZINE, ondansetron **OR** ondansetron (ZOFRAN) IV, oxyCODONE, polyethylene glycol, promethazine, Resource ThickenUp Clear Continuous Infusions: . sodium chloride Stopped (02/26/20 2221)  . sodium chloride Stopped (03/02/20 1734)  . feeding supplement (OSMOLITE 1.5 CAL) 55 mL/hr at 03/09/20 0300     LOS: 27 days  Time spent: Greater than 50% of the 35 minute visit was spent in counseling/coordination of care for the patient as laid out in the A&P.   Dwyane Dee, MD [facility] 03/09/2020, 4:27 PM

## 2020-03-10 DIAGNOSIS — G9340 Encephalopathy, unspecified: Secondary | ICD-10-CM | POA: Diagnosis not present

## 2020-03-10 DIAGNOSIS — G9389 Other specified disorders of brain: Secondary | ICD-10-CM | POA: Diagnosis not present

## 2020-03-10 DIAGNOSIS — Y95 Nosocomial condition: Secondary | ICD-10-CM | POA: Diagnosis not present

## 2020-03-10 DIAGNOSIS — J189 Pneumonia, unspecified organism: Secondary | ICD-10-CM | POA: Diagnosis not present

## 2020-03-10 LAB — BASIC METABOLIC PANEL
Anion gap: 10 (ref 5–15)
BUN: 18 mg/dL (ref 6–20)
CO2: 25 mmol/L (ref 22–32)
Calcium: 8.9 mg/dL (ref 8.9–10.3)
Chloride: 102 mmol/L (ref 98–111)
Creatinine, Ser: 0.63 mg/dL (ref 0.61–1.24)
GFR, Estimated: >60 mL/min (ref >60)
Glucose, Bld: 119 mg/dL — ABNORMAL HIGH (ref 70–99)
Potassium: 4 mmol/L (ref 3.5–5.1)
Sodium: 137 mmol/L (ref 135–145)

## 2020-03-10 LAB — CBC WITH DIFFERENTIAL/PLATELET
Abs Immature Granulocytes: 0.24 10*3/uL — ABNORMAL HIGH (ref 0.00–0.07)
Basophils Absolute: 0.1 10*3/uL (ref 0.0–0.1)
Basophils Relative: 1 %
Eosinophils Absolute: 1.1 10*3/uL — ABNORMAL HIGH (ref 0.0–0.5)
Eosinophils Relative: 12 %
HCT: 44.5 % (ref 39.0–52.0)
Hemoglobin: 14.3 g/dL (ref 13.0–17.0)
Immature Granulocytes: 3 %
Lymphocytes Relative: 23 %
Lymphs Abs: 2.1 10*3/uL (ref 0.7–4.0)
MCH: 30.1 pg (ref 26.0–34.0)
MCHC: 32.1 g/dL (ref 30.0–36.0)
MCV: 93.7 fL (ref 80.0–100.0)
Monocytes Absolute: 0.9 10*3/uL (ref 0.1–1.0)
Monocytes Relative: 10 %
Neutro Abs: 4.8 10*3/uL (ref 1.7–7.7)
Neutrophils Relative %: 51 %
Platelets: 451 10*3/uL — ABNORMAL HIGH (ref 150–400)
RBC: 4.75 MIL/uL (ref 4.22–5.81)
RDW: 13.4 % (ref 11.5–15.5)
WBC: 9.3 10*3/uL (ref 4.0–10.5)
nRBC: 0 % (ref 0.0–0.2)

## 2020-03-10 LAB — GLUCOSE, CAPILLARY
Glucose-Capillary: 118 mg/dL — ABNORMAL HIGH (ref 70–99)
Glucose-Capillary: 120 mg/dL — ABNORMAL HIGH (ref 70–99)
Glucose-Capillary: 123 mg/dL — ABNORMAL HIGH (ref 70–99)
Glucose-Capillary: 90 mg/dL (ref 70–99)
Glucose-Capillary: 114 mg/dL — ABNORMAL HIGH (ref 70–99)

## 2020-03-10 LAB — MAGNESIUM: Magnesium: 2.3 mg/dL (ref 1.7–2.4)

## 2020-03-10 NOTE — Progress Notes (Signed)
Patient intermittently restless. Attempts to pull at NG tube and condom cath. Patient confused, unable to redirect at times. Bilateral soft wrist restraints and Posey lapbelt will remain in place for patient safety, as ordered.  

## 2020-03-10 NOTE — Progress Notes (Signed)
Consult Progress Note    [name]   ZOX:[id number]  DOB: 1961-02-24  DOA: 02/11/2020     28  PCP: Leonard Downing, MD   Hospital Course: [name] is a 59 yo male with PMH of obesity; admitted on 02/11/2020, with complaint of seizures and confusion, was found to have brain mass s/p craniotomy on 12/1//2021. After extubation patient was referred to Hospitalist for medical management. 11/25 admission for seizures 12/1 craniotomy of the left parietal tumor, remains intubated 12/3 core track insertion 12/4 LTM EEG no seizures but epileptogenicity from left temporal region 12/5 prelim pathology report GBM, sent to [facility] for further evaluation, report not available. 12/9 family meeting and conversation with DNR and no intubation. 12/15 extubated to room air 12/17 PCCM signed off, TRH assuming medical care, low-grade fever, resolved on its own. Daughter converted patient back to FULL CODE. 12/19 right upper extremity Doppler shows SVT, no DVT.  Currently further plan is monitor for improvement in mentation  Interval History:  Patient remains altered and confused still. Just stares at me typically. Today was able to state his name to me, but that was it.   Old records reviewed in assessment of this patient  ROS: Review of systems not obtained due to patient factors. Cognitive impairment  Assessment & Plan: Acute metabolic encephalopathy Likely polypharmacy, CNS tumor as well as recent surgery. Medications are currently being weaned. Continue to monitor. Continue Seroquel and phenobarbital. Clonidine added. 12/19 opioids changed from scheduled to as needed.  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 On dexamethasone-management per primary team. Neurology was consulted, currently on Vimpat and phenobarbital for  seizures. EEG negative for active seizures.  Acute respiratory failure with hypoxia, POA secondary to encephalopathy. Needed intubation initially. Extubated on 12/15  MRSA HAP. Low-grade fever on 12/17, resolved on its own Prior pneumonia, treated with IV vancomycin. Course completed.  Paroxysmal A. Fib Currently rate controlled. Anticoagulation on hold due to CNS mass. Monitor.  Thoracic aortic aneurysm Incidental diagnosis. Repeat CT scan in 6 months.  Goals of care conversation Difficult social situation. Currently patient unable to participate in goals of care conversation. Initially based on the discussion with PCCM and the family patient was DNR; now he has been changed back to Full code  Hypokalemia. -Replete and recheck as needed  Dysphagia. -Has been on tube feeds -Repeat SLP eval, still unsafe for POs due to mentation - continue NPO and TF  Accelerated hypertension. Blood pressure was uncontrolled, now improved and stable Started on clonidine, will continue. Continue Norvasc dose reduced from 10 mg to 5 mg.  As needed hydralazine.  Right upper extremity SVT. No evidence of DVT. No need for anticoagulation. Not a candidate for anticoagulation regardless.  Pain control. Now on oxycodone 5-10 q6P; scheduled oxycodone has been discontinued  Low-grade temperature. Intermittent. Likely from SVT vs central fever  X-ray negative   DVT prophylaxis: Lovenox Code Status: Full   Objective: Blood pressure (!) 145/88, pulse 62, temperature 97.6 F (36.4 C), temperature source Oral, resp. rate 17, height 6\' 3"  (1.905 m), weight (!) 136.4 kg, SpO2 97 %.  Examination: General appearance: No further diaphoresis but he remains noninteractive and is just staring around the room nonpurposefully at times Head: Normocephalic, without obvious abnormality, atraumatic Eyes: EOMI Lungs: scattered coarse breath sounds, no wheezing Heart: regular rate and  rhythm and S1, S2 normal Abdomen: obese,  soft, NT, BS present Extremities: no edema Skin: diaphoretic, intact Neurologic: unable to fully evaluate; does not follow any commands; staring around the room  Data Reviewed: I have personally reviewed following labs and imaging studies Results for orders placed or performed during the hospital encounter of 02/11/20 (from the past 24 hour(s))  Glucose, capillary     Status: Abnormal   Collection Time: 03/09/20  4:02 PM  Result Value Ref Range   Glucose-Capillary 108 (H) 70 - 99 mg/dL  Glucose, capillary     Status: Abnormal   Collection Time: 03/09/20  8:22 PM  Result Value Ref Range   Glucose-Capillary 115 (H) 70 - 99 mg/dL  Glucose, capillary     Status: Abnormal   Collection Time: 03/09/20 11:51 PM  Result Value Ref Range   Glucose-Capillary 125 (H) 70 - 99 mg/dL  CBC with Differential/Platelet     Status: Abnormal   Collection Time: 03/10/20  2:18 AM  Result Value Ref Range   WBC 9.3 4.0 - 10.5 K/uL   RBC 4.75 4.22 - 5.81 MIL/uL   Hemoglobin 14.3 13.0 - 17.0 g/dL   HCT 44.5 39.0 - 52.0 %   MCV 93.7 80.0 - 100.0 fL   MCH 30.1 26.0 - 34.0 pg   MCHC 32.1 30.0 - 36.0 g/dL   RDW 13.4 11.5 - 15.5 %   Platelets 451 (H) 150 - 400 K/uL   nRBC 0.0 0.0 - 0.2 %   Neutrophils Relative % 51 %   Neutro Abs 4.8 1.7 - 7.7 K/uL   Lymphocytes Relative 23 %   Lymphs Abs 2.1 0.7 - 4.0 K/uL   Monocytes Relative 10 %   Monocytes Absolute 0.9 0.1 - 1.0 K/uL   Eosinophils Relative 12 %   Eosinophils Absolute 1.1 (H) 0.0 - 0.5 K/uL   Basophils Relative 1 %   Basophils Absolute 0.1 0.0 - 0.1 K/uL   Immature Granulocytes 3 %   Abs Immature Granulocytes 0.24 (H) 0.00 - 0.07 K/uL  Magnesium     Status: None   Collection Time: 03/10/20  2:18 AM  Result Value Ref Range   Magnesium 2.3 1.7 - 2.4 mg/dL  Basic metabolic panel     Status: Abnormal   Collection Time: 03/10/20  2:18 AM  Result Value Ref Range   Sodium 137 135 - 145 mmol/L   Potassium 4.0  3.5 - 5.1 mmol/L   Chloride 102 98 - 111 mmol/L   CO2 25 22 - 32 mmol/L   Glucose, Bld 119 (H) 70 - 99 mg/dL   BUN 18 6 - 20 mg/dL   Creatinine, Ser 0.63 0.61 - 1.24 mg/dL   Calcium 8.9 8.9 - 10.3 mg/dL   GFR, Estimated >60 >60 mL/min   Anion gap 10 5 - 15  Glucose, capillary     Status: Abnormal   Collection Time: 03/10/20  3:37 AM  Result Value Ref Range   Glucose-Capillary 118 (H) 70 - 99 mg/dL  Glucose, capillary     Status: Abnormal   Collection Time: 03/10/20  7:44 AM  Result Value Ref Range   Glucose-Capillary 120 (H) 70 - 99 mg/dL  Glucose, capillary     Status: Abnormal   Collection Time: 03/10/20 11:44 AM  Result Value Ref Range   Glucose-Capillary 123 (H) 70 - 99 mg/dL    No results found for this or any previous visit (from the past 240 hour(s)).   Radiology Studies: No results found. VAS [place] UPPER EXTREMITY VENOUS DUPLEX  Final Result    DG CHEST PORT 1 VIEW  Final Result    DG Abd 1 View  Final Result    DG Chest Port 1 View  Final Result    DG Chest Port 1 View  Final Result    DG CHEST PORT 1 VIEW  Final Result    CT CHEST W CONTRAST  Final Result    DG CHEST PORT 1 VIEW  Final Result    MR BRAIN W WO CONTRAST  Final Result    DG CHEST PORT 1 VIEW  Final Result    DG Abd Portable 1V  Final Result    [place] EKG SITE RITE  Final Result    CT HEAD W & WO CONTRAST  Final Result    DG Chest Port 1 View  Final Result      Scheduled Meds: . amLODipine  5 mg Per Tube Daily  . bethanechol  10 mg Per Tube TID  . Chlorhexidine Gluconate Cloth  6 each Topical Daily  . cloNIDine  0.1 mg Per Tube Daily  . docusate  100 mg Per Tube BID  . enoxaparin (LOVENOX) injection  40 mg Subcutaneous Q24H  . feeding supplement (PROSource TF)  90 mL Per Tube TID  . insulin aspart  0-15 Units Subcutaneous Q4H  . lacosamide  100 mg Per Tube BID  . pantoprazole sodium  40 mg Per Tube Daily  . PHENObarbital  90 mg Per Tube BID  . QUEtiapine  200 mg Per  Tube BID   PRN Meds: sodium chloride, acetaminophen **OR** acetaminophen, bisacodyl, diazepam, hydrALAZINE, ondansetron **OR** ondansetron (ZOFRAN) IV, oxyCODONE, polyethylene glycol, promethazine, Resource ThickenUp Clear Continuous Infusions: . sodium chloride Stopped (02/26/20 2221)  . sodium chloride Stopped (03/02/20 1734)  . feeding supplement (OSMOLITE 1.5 CAL) 1,000 mL (03/10/20 0251)     LOS: 28 days  Time spent: Greater than 50% of the 35 minute visit was spent in counseling/coordination of care for the patient as laid out in the A&P.   Dwyane Dee, MD [facility] 03/10/2020, 1:25 PM

## 2020-03-10 NOTE — Progress Notes (Signed)
Physical Therapy Treatment Patient Details Name: Antonio Glenn MRN: Antonio Glenn DOB: 1960-12-20 Today's Date: 03/10/2020    History of Present Illness Antonio Glenn is a 59 y.o. male with hx of recently diagnosed brain tumor who was transferred from [facility] to [facility] 11/25 with AMS and seizures.CRANIOTOMY OF  LEFT PARIETAL TUMOR 12/1 and on vent, trach off vent 12/15    PT Comments    Pt making small gains toward goals.  Focus on task is tenuous, need frequent redirection.  Emphasis on following simple direction cues with/without non verbal cues, transitions, sit to stand to sit, pre gait activity in the RW.  Side stepping to HOB.  Standing activity for standing tolerance/balance.    Follow Up Recommendations  CIR     Equipment Recommendations  Other (comment) (TBD)    Recommendations for Other Services Rehab consult     Precautions / Restrictions Precautions Precautions: Fall Precaution Comments: B UE and waist restraints; impulsive    Mobility  Bed Mobility Overal bed mobility: Needs Assistance Bed Mobility: Supine to Sit;Sit to Supine     Supine to sit: Max assist;+2 for physical assistance;Total assist Sit to supine: Max assist;+2 for physical assistance   General bed mobility comments: guidance cues and assist throughout  Transfers Overall transfer level: Needs assistance Equipment used: Rolling walker (2 wheeled) Transfers: Sit to/from Stand Sit to Stand: Mod assist;+2 safety/equipment         General transfer comment: guidance/focus cues and assist forward with some boost.  stability once upright.  2 trials  Ambulation/Gait             General Gait Details: side stepping toward [facility] with w/shift and stability assist plus assist with RW.  Pt unable to maintain hold and control with R UE.   Stairs             Wheelchair Mobility    Modified Rankin (Stroke Patients Only)       Balance   Sitting-balance support: Feet supported;No  upper extremity supported;Bilateral upper extremity supported Sitting balance-Leahy Scale: Poor Sitting balance - Comments: still biased generally posteriorly exacerbated by difficulty with focus.   Standing balance support: Bilateral upper extremity supported Standing balance-Leahy Scale: Poor Standing balance comment: stood in the RW x2 for 2-3 min during bed change and peri care, working on balance, stance in the RW, w/shift and unweighting to step in place and progress toward HOB>                            Cognition Arousal/Alertness: Lethargic;Awake/alert Behavior During Therapy: Impulsive;Restless Overall Cognitive Status: Impaired/Different from baseline Area of Impairment: Orientation;Attention;Memory;Following commands;Safety/judgement;Awareness;Problem solving                 Orientation Level: Situation;Time;Place Current Attention Level: Focused Memory: Decreased recall of precautions;Decreased short-term memory Following Commands: Follows one step commands inconsistently Safety/Judgement: Decreased awareness of safety;Decreased awareness of deficits Awareness: Intellectual Problem Solving: Slow processing;Decreased initiation;Difficulty sequencing;Requires verbal cues;Requires tactile cues        Exercises Other Exercises Other Exercises: general warm up A/AA/resistive ROM to all 4 extremities    General Comments General comments (skin integrity, edema, etc.): vss      Pertinent Vitals/Pain Pain Assessment: Faces Faces Pain Scale: No hurt Pain Intervention(s): Monitored during session    Home Living  Prior Function            PT Goals (current goals can now be found in the care plan section) Acute Rehab PT Goals PT Goal Formulation: With patient Time For Goal Achievement: 03/18/20 Potential to Achieve Goals: Fair Progress towards PT goals: Progressing toward goals    Frequency    Min 4X/week       PT Plan      Co-evaluation              AM-PAC PT "6 Clicks" Mobility   Outcome Measure  Help needed turning from your back to your side while in a flat bed without using bedrails?: A Lot Help needed moving from lying on your back to sitting on the side of a flat bed without using bedrails?: A Lot Help needed moving to and from a bed to a chair (including a wheelchair)?: A Lot Help needed standing up from a chair using your arms (e.g., wheelchair or bedside chair)?: A Lot Help needed to walk in hospital room?: A Lot Help needed climbing 3-5 steps with a railing? : Total 6 Click Score: 11    End of Session   Activity Tolerance: Patient tolerated treatment well Patient left: in bed;with call bell/phone within reach;with restraints reapplied;with bed alarm set Nurse Communication: Mobility status PT Visit Diagnosis: Unsteadiness on feet (R26.81);Other abnormalities of gait and mobility (R26.89);Difficulty in walking, not elsewhere classified (R26.2);Other symptoms and signs involving the nervous system (R29.898) Hemiplegia - Right/Left: Right Hemiplegia - dominant/non-dominant: Dominant Hemiplegia - caused by: Unspecified     Time: 1740-1806 PT Time Calculation (min) (ACUTE ONLY): 26 min  Charges:  $Therapeutic Activity: 23-37 mins                     03/10/2020  Ginger Carne., PT Acute Rehabilitation Services [phone]  (pager) [phone]  (office)   Tessie Fass Juanangel Soderholm 03/10/2020, 6:21 PM

## 2020-03-10 NOTE — Progress Notes (Signed)
[name] (pt ex-wife/claims to be POA) called to speak to pt's primary RN for an update. This RN took phone call and phone number for primary RN to call [name] back.   Justice Rocher, RN

## 2020-03-10 NOTE — Progress Notes (Signed)
Pt friend called wanting to speak to the charge RN. This (charge) RN took phone call and pt friend was upset saying that none of the family members have been able to get an update on the pt or have their calls returned. Caller claims to be a friend and says she is a [employer] scientist, when asked says her name is Maudie Mercury. She accused this unit of "breaking HIPPA" with pt information. She also states that there should be 3 persons on visitor list - pt's daughter, pt's ex-wife, and herself. This RN began to explain that updates from the hospital are given to one family member and that it is then that person's responsibility to update other family members/friends. This RN was hung up on.   Justice Rocher, RN

## 2020-03-10 NOTE — Progress Notes (Signed)
Patients daughter Alyson Ingles called and was updated about what's going on with her father. Daughter was updated on what's going on and also informed the daughter to pick one person to call for updates and they share updates between them.  Daughter stated her mom patients ex wife had medical POA asked her to please decide who they wanted to call for updates and also provide copy of Medical POA paperwork to hospital.   Mee Hives, Tivis Ringer, RN

## 2020-03-10 NOTE — Progress Notes (Signed)
Subjective: NAEs o/n  Objective: Vital signs in last 24 hours: Temp:  [97.9 F (36.6 C)-99.1 F (37.3 C)] 98 F (36.7 C) (12/23 0748) Pulse Rate:  [94-106] 98 (12/23 0748) Resp:  [11-19] 15 (12/23 0748) BP: (136-147)/(77-92) 147/92 (12/23 0748) SpO2:  [95 %-99 %] 98 % (12/23 0748)  Intake/Output from previous day: 12/22 0701 - 12/23 0700 In: 514.3 [NG/GT:514.3] Out: 250 [Urine:250] Intake/Output this shift: No intake/output data recorded.  Alert, oriented to person only.  Follows simple commands x 4  Incision healing well.  Lab Results: Recent Labs    03/09/20 0146 03/10/20 0218  WBC 9.8 9.3  HGB 14.1 14.3  HCT 41.7 44.5  PLT 476* 451*   BMET Recent Labs    03/09/20 0146 03/10/20 0218  NA 138 137  K 4.0 4.0  CL 103 102  CO2 25 25  GLUCOSE 114* 119*  BUN 20 18  CREATININE 0.71 0.63  CALCIUM 8.8* 8.9    Studies/Results: No results found.  Assessment/Plan: Left parietal tumor s/p resection - cont supportive treatments - likely CIR vs SNF - still awaiting path   [name] 03/10/2020, 8:06 AM

## 2020-03-11 DIAGNOSIS — R4182 Altered mental status, unspecified: Secondary | ICD-10-CM | POA: Diagnosis not present

## 2020-03-11 DIAGNOSIS — I10 Essential (primary) hypertension: Secondary | ICD-10-CM | POA: Diagnosis not present

## 2020-03-11 LAB — CBC WITH DIFFERENTIAL/PLATELET
Abs Immature Granulocytes: 0.2 10*3/uL — ABNORMAL HIGH (ref 0.00–0.07)
Basophils Absolute: 0.1 10*3/uL (ref 0.0–0.1)
Basophils Relative: 1 %
Eosinophils Absolute: 1.1 10*3/uL — ABNORMAL HIGH (ref 0.0–0.5)
Eosinophils Relative: 12 %
HCT: 42.3 % (ref 39.0–52.0)
Hemoglobin: 13.9 g/dL (ref 13.0–17.0)
Immature Granulocytes: 2 %
Lymphocytes Relative: 19 %
Lymphs Abs: 1.7 10*3/uL (ref 0.7–4.0)
MCH: 29.8 pg (ref 26.0–34.0)
MCHC: 32.9 g/dL (ref 30.0–36.0)
MCV: 90.6 fL (ref 80.0–100.0)
Monocytes Absolute: 0.9 10*3/uL (ref 0.1–1.0)
Monocytes Relative: 10 %
Neutro Abs: 5.1 10*3/uL (ref 1.7–7.7)
Neutrophils Relative %: 56 %
Platelets: 502 10*3/uL — ABNORMAL HIGH (ref 150–400)
RBC: 4.67 MIL/uL (ref 4.22–5.81)
RDW: 13.2 % (ref 11.5–15.5)
WBC: 9 10*3/uL (ref 4.0–10.5)
nRBC: 0 % (ref 0.0–0.2)

## 2020-03-11 LAB — BASIC METABOLIC PANEL
Anion gap: 9 (ref 5–15)
BUN: 18 mg/dL (ref 6–20)
CO2: 25 mmol/L (ref 22–32)
Calcium: 8.6 mg/dL — ABNORMAL LOW (ref 8.9–10.3)
Chloride: 102 mmol/L (ref 98–111)
Creatinine, Ser: 0.66 mg/dL (ref 0.61–1.24)
GFR, Estimated: >60 mL/min (ref >60)
Glucose, Bld: 109 mg/dL — ABNORMAL HIGH (ref 70–99)
Potassium: 3.9 mmol/L (ref 3.5–5.1)
Sodium: 136 mmol/L (ref 135–145)

## 2020-03-11 LAB — GLUCOSE, CAPILLARY
Glucose-Capillary: 110 mg/dL — ABNORMAL HIGH (ref 70–99)
Glucose-Capillary: 111 mg/dL — ABNORMAL HIGH (ref 70–99)
Glucose-Capillary: 117 mg/dL — ABNORMAL HIGH (ref 70–99)
Glucose-Capillary: 121 mg/dL — ABNORMAL HIGH (ref 70–99)
Glucose-Capillary: 140 mg/dL — ABNORMAL HIGH (ref 70–99)
Glucose-Capillary: 144 mg/dL — ABNORMAL HIGH (ref 70–99)
Glucose-Capillary: 99 mg/dL (ref 70–99)

## 2020-03-11 LAB — MAGNESIUM: Magnesium: 2.2 mg/dL (ref 1.7–2.4)

## 2020-03-11 NOTE — Progress Notes (Signed)
Patients daughter came to see him from 1300-1400 in the afternoon. She was asked to wear PPE due to patients contact precaution and she refused.

## 2020-03-11 NOTE — Progress Notes (Signed)
Consult Progress Note    Antonio Glenn   OZD:[id number]  DOB: January 25, 1961  DOA: 02/11/2020     28  PCP: Antonio Downing, MD   Hospital Course: Antonio Glenn is a 59 yo male with PMH of obesity; admitted on 02/11/2020, with complaint of seizures and confusion, was found to have brain mass s/p craniotomy on 12/1//2021. After extubation patient was referred to Hospitalist for medical management. 11/25 admission for seizures 12/1 craniotomy of the left parietal tumor, remains intubated 12/3 core track insertion 12/4 LTM EEG no seizures but epileptogenicity from left temporal region 12/5 prelim pathology report GBM, sent to [facility] for further evaluation, report not available. 12/9 family meeting and conversation with DNR and no intubation. 12/15 extubated to room air 12/17 PCCM signed off, TRH assuming medical care, low-grade fever, resolved on its own. Daughter converted patient back to FULL CODE. 12/19 right upper extremity Doppler shows SVT, no DVT.  Currently further plan is monitor for improvement in mentation  Interval History:  Patient remains altered and confused still. Was more talkative today but still pleasantly confused. Knows his name but with any other questions asked he just says his name over again.   Old records reviewed in assessment of this patient  ROS: Review of systems not obtained due to patient factors. Cognitive impairment  Assessment & Plan: Acute metabolic encephalopathy Likely polypharmacy, CNS tumor as well as recent surgery. Medications are currently being weaned. Continue to monitor. Continue Seroquel and phenobarbital. Clonidine added. 12/19 opioids changed from scheduled to as needed.  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 On dexamethasone-management per primary team. Neurology was  consulted, currently on Vimpat and phenobarbital for seizures. EEG negative for active seizures.  Acute respiratory failure with hypoxia, POA secondary to encephalopathy. Needed intubation initially. Extubated on 12/15  MRSA HAP. Low-grade fever on 12/17, resolved on its own Prior pneumonia, treated with IV vancomycin. Course completed.  Paroxysmal A. Fib Currently rate controlled. Anticoagulation on hold due to CNS mass. Monitor.  Thoracic aortic aneurysm Incidental diagnosis. Repeat CT scan in 6 months.  Goals of care conversation Difficult social situation. Currently patient unable to participate in goals of care conversation. Initially based on the discussion with PCCM and the family patient was DNR; now he has been changed back to Full code  Hypokalemia. -Replete and recheck as needed  Dysphagia. -Has been on tube feeds -Repeat SLP eval, still unsafe for POs due to mentation - continue NPO and TF  Accelerated hypertension. Blood pressure was uncontrolled, now improved and stable Started on clonidine, will continue. Continue Norvasc dose reduced from 10 mg to 5 mg.  As needed hydralazine.  Right upper extremity SVT. No evidence of DVT. No need for anticoagulation. Not a candidate for anticoagulation regardless.  Pain control. Now on oxycodone 5-10 q6P; scheduled oxycodone has been discontinued  Low-grade temperature. Intermittent. Likely from SVT vs central fever  X-ray negative   DVT prophylaxis: Lovenox Code Status: Full   Objective: Blood pressure (!) 141/77, pulse (!) 102, temperature 99.1 F (37.3 C), temperature source Axillary, resp. rate 17, height 6\' 3"  (1.905 m), weight (!) 136.4 kg, SpO2 98 %.  Examination: General appearance: No further diaphoresis but he remains noninteractive and is just staring around the room nonpurposefully at times Head: Normocephalic, without obvious abnormality, atraumatic Eyes: EOMI Lungs: scattered  coarse breath sounds, no wheezing Heart: regular rate and  rhythm and S1, S2 normal Abdomen: obese, soft, NT, BS present Extremities: no edema Skin: diaphoretic, intact Neurologic: unable to fully evaluate; follows some commands intermittently; staring around the room  Data Reviewed: I have personally reviewed following labs and imaging studies Results for orders placed or performed during the hospital encounter of 02/11/20 (from the past 24 hour(s))  Glucose, capillary     Status: None   Collection Time: 03/10/20  3:46 PM  Result Value Ref Range   Glucose-Capillary 90 70 - 99 mg/dL  Glucose, capillary     Status: Abnormal   Collection Time: 03/10/20  8:29 PM  Result Value Ref Range   Glucose-Capillary 114 (H) 70 - 99 mg/dL  Glucose, capillary     Status: Abnormal   Collection Time: 03/10/20 11:52 PM  Result Value Ref Range   Glucose-Capillary 110 (H) 70 - 99 mg/dL  CBC with Differential/Platelet     Status: Abnormal   Collection Time: 03/11/20  2:33 AM  Result Value Ref Range   WBC 9.0 4.0 - 10.5 K/uL   RBC 4.67 4.22 - 5.81 MIL/uL   Hemoglobin 13.9 13.0 - 17.0 g/dL   HCT 42.3 39.0 - 52.0 %   MCV 90.6 80.0 - 100.0 fL   MCH 29.8 26.0 - 34.0 pg   MCHC 32.9 30.0 - 36.0 g/dL   RDW 13.2 11.5 - 15.5 %   Platelets 502 (H) 150 - 400 K/uL   nRBC 0.0 0.0 - 0.2 %   Neutrophils Relative % 56 %   Neutro Abs 5.1 1.7 - 7.7 K/uL   Lymphocytes Relative 19 %   Lymphs Abs 1.7 0.7 - 4.0 K/uL   Monocytes Relative 10 %   Monocytes Absolute 0.9 0.1 - 1.0 K/uL   Eosinophils Relative 12 %   Eosinophils Absolute 1.1 (H) 0.0 - 0.5 K/uL   Basophils Relative 1 %   Basophils Absolute 0.1 0.0 - 0.1 K/uL   Immature Granulocytes 2 %   Abs Immature Granulocytes 0.20 (H) 0.00 - 0.07 K/uL  Magnesium     Status: None   Collection Time: 03/11/20  2:33 AM  Result Value Ref Range   Magnesium 2.2 1.7 - 2.4 mg/dL  Basic metabolic panel     Status: Abnormal   Collection Time: 03/11/20  2:33 AM  Result Value  Ref Range   Sodium 136 135 - 145 mmol/L   Potassium 3.9 3.5 - 5.1 mmol/L   Chloride 102 98 - 111 mmol/L   CO2 25 22 - 32 mmol/L   Glucose, Bld 109 (H) 70 - 99 mg/dL   BUN 18 6 - 20 mg/dL   Creatinine, Ser 0.66 0.61 - 1.24 mg/dL   Calcium 8.6 (L) 8.9 - 10.3 mg/dL   GFR, Estimated >60 >60 mL/min   Anion gap 9 5 - 15  Glucose, capillary     Status: Abnormal   Collection Time: 03/11/20  4:38 AM  Result Value Ref Range   Glucose-Capillary 121 (H) 70 - 99 mg/dL  Glucose, capillary     Status: Abnormal   Collection Time: 03/11/20  7:33 AM  Result Value Ref Range   Glucose-Capillary 111 (H) 70 - 99 mg/dL  Glucose, capillary     Status: Abnormal   Collection Time: 03/11/20 12:09 PM  Result Value Ref Range   Glucose-Capillary 140 (H) 70 - 99 mg/dL    No results found for this or any previous visit (from the past 240 hour(s)).   Radiology Studies: No results found.  VAS [place] UPPER EXTREMITY VENOUS DUPLEX  Final Result    DG CHEST PORT 1 VIEW  Final Result    DG Abd 1 View  Final Result    DG Chest Port 1 View  Final Result    DG Chest Port 1 View  Final Result    DG CHEST PORT 1 VIEW  Final Result    CT CHEST W CONTRAST  Final Result    DG CHEST PORT 1 VIEW  Final Result    MR BRAIN W WO CONTRAST  Final Result    DG CHEST PORT 1 VIEW  Final Result    DG Abd Portable 1V  Final Result    [place] EKG SITE RITE  Final Result    CT HEAD W & WO CONTRAST  Final Result    DG Chest Port 1 View  Final Result      Scheduled Meds: . amLODipine  5 mg Per Tube Daily  . bethanechol  10 mg Per Tube TID  . Chlorhexidine Gluconate Cloth  6 each Topical Daily  . cloNIDine  0.1 mg Per Tube Daily  . docusate  100 mg Per Tube BID  . enoxaparin (LOVENOX) injection  40 mg Subcutaneous Q24H  . feeding supplement (PROSource TF)  90 mL Per Tube TID  . insulin aspart  0-15 Units Subcutaneous Q4H  . lacosamide  100 mg Per Tube BID  . pantoprazole sodium  40 mg Per Tube Daily  .  PHENObarbital  90 mg Per Tube BID  . QUEtiapine  200 mg Per Tube BID   PRN Meds: sodium chloride, acetaminophen **OR** acetaminophen, bisacodyl, diazepam, hydrALAZINE, ondansetron **OR** ondansetron (ZOFRAN) IV, oxyCODONE, polyethylene glycol, promethazine, Resource ThickenUp Clear Continuous Infusions: . sodium chloride Stopped (02/26/20 2221)  . sodium chloride Stopped (03/02/20 1734)  . feeding supplement (OSMOLITE 1.5 CAL) 1,000 mL (03/10/20 0251)     LOS: 29 days  Time spent: Greater than 50% of the 35 minute visit was spent in counseling/coordination of care for the patient as laid out in the A&P.   Dwyane Dee, MD [facility] 03/11/2020, 1:59 PM

## 2020-03-11 NOTE — Progress Notes (Addendum)
  Speech Language Pathology Treatment: Dysphagia;Cognitive-Linquistic  Patient Details Name: Antonio Glenn MRN: [id number] DOB: [birth year] Today's Date: 03/11/2020 Time: 1209-1225 SLP Time Calculation (min) (ACUTE ONLY): 16 min  Assessment / Plan / Recommendation Clinical Impression  Pt snoring when therapist arrived needing max stimulation to wake and never became fully alert throughout session. Sensory tract is reduced as diagnosed during FEES and quality of voice is wet/gurgly, congested secretions. Given verbal cues and visual demonstration he could not achieve throat clear or cough. Immediate and delayed throat clear after honey thick water did result in normal vocal quality for a brief amount of time. He should continue NPO with sips honey thick water periodically. Presently he is not yet ready for repeat instrumental assessment of swallow. Noted he is now for SNF placement and will need longer term alternative means of nutrition. SLP will plan to see him Monday as schedule allows for potential repeat instrumental assessment.  Verbal output dysarthric and aphasic with semantic paraphasias. Can communicate socially "I'm fine", "doing good" etc however dysfunctional for longer utterances. Responsive naming questions 20% accurate.    HPI HPI: Antonio Glenn is a 59 y.o. male with hx of recently diagnosed brain tumor (left parietal lobe) who was transferred from [facility] ED to [facility] 11/25 with AMS and seizures. Underwent resection 12/1. Intubated 12/1-12/15.      SLP Plan  Continue with current plan of care       Recommendations  Diet recommendations: Other(comment) (honey thick water) Liquids provided via: Teaspoon Medication Administration: Via alternative means Supervision: Staff to assist with self feeding;Full supervision/cueing for compensatory strategies Compensations: Slow rate;Small sips/bites;Clear throat intermittently Postural Changes and/or Swallow Maneuvers: Seated upright  90 degrees;Out of bed for meals                Oral Care Recommendations: Oral care QID Follow up Recommendations: Skilled Nursing facility SLP Visit Diagnosis: Dysphagia, oropharyngeal phase (R13.12);Aphasia (R47.01) Plan: Continue with current plan of care       GO                Houston Siren 03/11/2020, 12:46 PM  Orbie Pyo Colvin Caroli.Ed Risk analyst [phone] Office [phone]

## 2020-03-11 NOTE — Progress Notes (Signed)
   Providing Compassionate, Quality Care - Together   Subjective: Patient with confusion. Nurse reports no issues overnight.  Objective: Vital signs in last 24 hours: Temp:  [97.6 F (36.4 C)-99.2 F (37.3 C)] 99.2 F (37.3 C) (12/24 0733) Pulse Rate:  [62-93] 88 (12/24 0733) Resp:  [11-20] 11 (12/24 0733) BP: (135-151)/(88-99) 135/93 (12/24 0733) SpO2:  [97 %-98 %] 98 % (12/24 0400) Weight:  [136.4 kg] 136.4 kg (12/24 0435)  Intake/Output from previous day: 12/23 0701 - 12/24 0700 In: 1320 [NG/GT:1320] Out: 800 [Urine:800] Intake/Output this shift: No intake/output data recorded.  Oriented to self Speech clear, confused MAE, Strength and sensation appear intact Follows commands Incision clean, dry, and intact   Lab Results: Recent Labs    03/10/20 0218 03/11/20 0233  WBC 9.3 9.0  HGB 14.3 13.9  HCT 44.5 42.3  PLT 451* 502*   BMET Recent Labs    03/10/20 0218 03/11/20 0233  NA 137 136  K 4.0 3.9  CL 102 102  CO2 25 25  GLUCOSE 119* 109*  BUN 18 18  CREATININE 0.63 0.66  CALCIUM 8.9 8.6*    Studies/Results: No results found.  Assessment/Plan: Patient is 23 days status post craniotomy for left parietal tumor resection by Dr. Marcello Moores.   LOS: 29 days    -Therapies recommending CIR at discharge -Continue supportive efforts   Viona Gilmore, [place], AGNP-C Nurse Practitioner  [facility] [place]. [address], [place], [place] [address] P: [phone]    F: [phone]  03/11/2020, 9:28 AM

## 2020-03-11 NOTE — Progress Notes (Signed)
Inpatient Rehabilitation Admissions Coordinator   I spoke with patient's daughter, [name] by phone . She verifies that she and her Mom are dual POA's. They have discussed and patient does not have the 24/7 caregiver support projected that he will need to be admitted to [place]. They request SNF. I will alert acute team and TOC and sign off at this time.  Danne Baxter, RN, MSN Rehab Admissions Coordinator [phone] 03/11/2020 11:31 AM

## 2020-03-12 DIAGNOSIS — I159 Secondary hypertension, unspecified: Secondary | ICD-10-CM | POA: Diagnosis not present

## 2020-03-12 DIAGNOSIS — R4182 Altered mental status, unspecified: Secondary | ICD-10-CM | POA: Diagnosis not present

## 2020-03-12 LAB — CBC WITH DIFFERENTIAL/PLATELET
Abs Immature Granulocytes: 0.18 10*3/uL — ABNORMAL HIGH (ref 0.00–0.07)
Basophils Absolute: 0.1 10*3/uL (ref 0.0–0.1)
Basophils Relative: 1 %
Eosinophils Absolute: 1.1 10*3/uL — ABNORMAL HIGH (ref 0.0–0.5)
Eosinophils Relative: 13 %
HCT: 41.9 % (ref 39.0–52.0)
Hemoglobin: 13.6 g/dL (ref 13.0–17.0)
Immature Granulocytes: 2 %
Lymphocytes Relative: 18 %
Lymphs Abs: 1.6 10*3/uL (ref 0.7–4.0)
MCH: 29.3 pg (ref 26.0–34.0)
MCHC: 32.5 g/dL (ref 30.0–36.0)
MCV: 90.3 fL (ref 80.0–100.0)
Monocytes Absolute: 0.8 10*3/uL (ref 0.1–1.0)
Monocytes Relative: 9 %
Neutro Abs: 5.2 10*3/uL (ref 1.7–7.7)
Neutrophils Relative %: 57 %
Platelets: 525 10*3/uL — ABNORMAL HIGH (ref 150–400)
RBC: 4.64 MIL/uL (ref 4.22–5.81)
RDW: 13.3 % (ref 11.5–15.5)
WBC: 9.1 10*3/uL (ref 4.0–10.5)
nRBC: 0 % (ref 0.0–0.2)

## 2020-03-12 LAB — BASIC METABOLIC PANEL
Anion gap: 9 (ref 5–15)
BUN: 15 mg/dL (ref 6–20)
CO2: 24 mmol/L (ref 22–32)
Calcium: 8.6 mg/dL — ABNORMAL LOW (ref 8.9–10.3)
Chloride: 104 mmol/L (ref 98–111)
Creatinine, Ser: 0.64 mg/dL (ref 0.61–1.24)
GFR, Estimated: >60 mL/min (ref >60)
Glucose, Bld: 109 mg/dL — ABNORMAL HIGH (ref 70–99)
Potassium: 4 mmol/L (ref 3.5–5.1)
Sodium: 137 mmol/L (ref 135–145)

## 2020-03-12 LAB — GLUCOSE, CAPILLARY
Glucose-Capillary: 105 mg/dL — ABNORMAL HIGH (ref 70–99)
Glucose-Capillary: 111 mg/dL — ABNORMAL HIGH (ref 70–99)
Glucose-Capillary: 120 mg/dL — ABNORMAL HIGH (ref 70–99)
Glucose-Capillary: 122 mg/dL — ABNORMAL HIGH (ref 70–99)
Glucose-Capillary: 123 mg/dL — ABNORMAL HIGH (ref 70–99)
Glucose-Capillary: 134 mg/dL — ABNORMAL HIGH (ref 70–99)

## 2020-03-12 LAB — MAGNESIUM: Magnesium: 2.2 mg/dL (ref 1.7–2.4)

## 2020-03-12 MED ORDER — AMLODIPINE BESYLATE 5 MG PO TABS
5.0000 mg | ORAL_TABLET | Freq: Once | ORAL | Status: AC
  Administered 2020-03-12: 5 mg via ORAL
  Filled 2020-03-12: qty 1

## 2020-03-12 MED ORDER — AMLODIPINE BESYLATE 10 MG PO TABS
10.0000 mg | ORAL_TABLET | Freq: Every day | ORAL | Status: DC
  Administered 2020-03-13 – 2020-03-26 (×14): 10 mg
  Filled 2020-03-12 (×14): qty 1

## 2020-03-12 NOTE — Progress Notes (Signed)
BP (!) 159/91 (BP Location: Left Arm)   Pulse 89   Temp 98.3 F (36.8 C) (Oral)   Resp 16   Ht 6\' 3"  (1.905 m)   Wt 134.6 kg   SpO2 96%   BMI 37.09 kg/m  Alert, will speak, non fluent, confused Moving all extremities Not following most commands Wound is clean, dry, no signs of infection improved

## 2020-03-12 NOTE — Progress Notes (Signed)
Pt noted to be coughing up secretions with rhonchi upper breath sounds but would not allow this nurse to preform oral suctioning, pt kept swallowing secretions after coughing them up.  This nurse consulted respiratory therapy and then paged the on call provider Sharion Settler for NT suction PRN orders.  RT came to bedside and suctioned small to moderate amount of tan secretions and pt coughed up tan secretions as well.  Pt HR and O2 saturations stable, will continue to monitor respiratory status

## 2020-03-12 NOTE — Progress Notes (Signed)
Consult Progress Note    Antonio Glenn   ZOX:[id number]  DOB: 02-26-1961  DOA: 02/11/2020     30  PCP: Antonio Downing, MD   Hospital Course: Antonio Glenn is a 59 yo male with PMH of obesity; admitted on 02/11/2020, with complaint of seizures and confusion, was found to have brain mass s/p craniotomy on 12/1//2021. After extubation patient was referred to Hospitalist for medical management. 11/25 admission for seizures 12/1 craniotomy of the left parietal tumor, remains intubated 12/3 core track insertion 12/4 LTM EEG no seizures but epileptogenicity from left temporal region 12/5 prelim pathology report GBM, sent to [facility] for further evaluation, report not available. 12/9 family meeting and conversation with DNR and no intubation. 12/15 extubated to room air 12/17 PCCM signed off, TRH assuming medical care, low-grade fever, resolved on its own. Daughter converted patient back to FULL CODE. 12/19 right upper extremity Doppler shows SVT, no DVT.  Currently further plan is monitor for improvement in mentation  Interval History:  No events overnight.  Remains restrained in bed and speech is very dysarthric.  He is able to state his name still but otherwise cannot answer any other questions which is pretty much his baseline.  Old records reviewed in assessment of this patient  ROS: Review of systems not obtained due to patient factors. Cognitive impairment  Assessment & Plan: Acute metabolic encephalopathy - Likely polypharmacy, CNS tumor as well as recent surgery. Meds changed - Continue Seroquel and phenobarbital. - 12/19 opioids changed from scheduled to as needed.  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 On dexamethasone-management per primary team. Neurology was consulted, currently on Vimpat and phenobarbital  for seizures. EEG negative for active seizures.  Acute respiratory failure with hypoxia, POA secondary to encephalopathy. Needed intubation initially. Extubated on 12/15  MRSA HAP - resolved Low-grade fever on 12/17, resolved on its own Prior pneumonia, treated with IV vancomycin. Course completed.  Paroxysmal A. Fib Currently rate controlled. Anticoagulation on hold due to CNS mass. Monitor.  Thoracic aortic aneurysm Incidental diagnosis. Repeat CT scan in 6 months.  Goals of care conversation Difficult social situation. Currently patient unable to participate in goals of care conversation. Initially based on the discussion with PCCM and the family patient was DNR; now he has been changed back to Full code  Hypokalemia. -Replete and recheck as needed  Dysphagia. -Has been on tube feeds -Repeat SLP eval, still unsafe for POs due to mentation - continue NPO and TF  Accelerated hypertension. Blood pressure was uncontrolled, now improved Started on clonidine, will continue. Continue Norvasc; adjusting dose due to BP still slightly above goal  As needed hydralazine.  Right upper extremity SVT. No evidence of DVT. No need for anticoagulation. Not a candidate for anticoagulation regardless.  Pain control. Now on oxycodone 5-10 q6P; scheduled oxycodone has been discontinued  Low-grade temperature. Intermittent. Likely from SVT vs central fever  X-ray negative   DVT prophylaxis: Lovenox Code Status: Full   Objective: Blood pressure (!) 159/91, pulse 89, temperature 98.3 F (36.8 C), temperature source Oral, resp. rate 16, height 6\' 3"  (1.905 m), weight 134.6 kg, SpO2 96 %.  Examination: General appearance: Awake and talks at times but only can answer his name.  He follows commands intermittently but not very complex ones.  Restraints remain in place for patient safety Head: Normocephalic, without obvious abnormality, atraumatic Eyes: EOMI Lungs:  scattered coarse breath sounds, no wheezing Heart: regular rate and rhythm and S1, S2 normal Abdomen: obese, soft, NT, BS present Extremities: no edema Skin: diaphoretic, intact Neurologic: unable to fully evaluate; follows some commands intermittently; staring around the room  Data Reviewed: I have personally reviewed following labs and imaging studies Results for orders placed or performed during the hospital encounter of 02/11/20 (from the past 24 hour(s))  Glucose, capillary     Status: Abnormal   Collection Time: 03/11/20 12:09 PM  Result Value Ref Range   Glucose-Capillary 140 (H) 70 - 99 mg/dL  Glucose, capillary     Status: None   Collection Time: 03/11/20  3:21 PM  Result Value Ref Range   Glucose-Capillary 99 70 - 99 mg/dL  Glucose, capillary     Status: Abnormal   Collection Time: 03/11/20  8:07 PM  Result Value Ref Range   Glucose-Capillary 117 (H) 70 - 99 mg/dL  Glucose, capillary     Status: Abnormal   Collection Time: 03/11/20 11:38 PM  Result Value Ref Range   Glucose-Capillary 144 (H) 70 - 99 mg/dL  CBC with Differential/Platelet     Status: Abnormal   Collection Time: 03/12/20 12:51 AM  Result Value Ref Range   WBC 9.1 4.0 - 10.5 K/uL   RBC 4.64 4.22 - 5.81 MIL/uL   Hemoglobin 13.6 13.0 - 17.0 g/dL   HCT 41.9 39.0 - 52.0 %   MCV 90.3 80.0 - 100.0 fL   MCH 29.3 26.0 - 34.0 pg   MCHC 32.5 30.0 - 36.0 g/dL   RDW 13.3 11.5 - 15.5 %   Platelets 525 (H) 150 - 400 K/uL   nRBC 0.0 0.0 - 0.2 %   Neutrophils Relative % 57 %   Neutro Abs 5.2 1.7 - 7.7 K/uL   Lymphocytes Relative 18 %   Lymphs Abs 1.6 0.7 - 4.0 K/uL   Monocytes Relative 9 %   Monocytes Absolute 0.8 0.1 - 1.0 K/uL   Eosinophils Relative 13 %   Eosinophils Absolute 1.1 (H) 0.0 - 0.5 K/uL   Basophils Relative 1 %   Basophils Absolute 0.1 0.0 - 0.1 K/uL   Immature Granulocytes 2 %   Abs Immature Granulocytes 0.18 (H) 0.00 - 0.07 K/uL  Magnesium     Status: None   Collection Time: 03/12/20 12:51 AM   Result Value Ref Range   Magnesium 2.2 1.7 - 2.4 mg/dL  Basic metabolic panel     Status: Abnormal   Collection Time: 03/12/20 12:51 AM  Result Value Ref Range   Sodium 137 135 - 145 mmol/L   Potassium 4.0 3.5 - 5.1 mmol/L   Chloride 104 98 - 111 mmol/L   CO2 24 22 - 32 mmol/L   Glucose, Bld 109 (H) 70 - 99 mg/dL   BUN 15 6 - 20 mg/dL   Creatinine, Ser 0.64 0.61 - 1.24 mg/dL   Calcium 8.6 (L) 8.9 - 10.3 mg/dL   GFR, Estimated >60 >60 mL/min   Anion gap 9 5 - 15  Glucose, capillary     Status: Abnormal   Collection Time: 03/12/20  4:20 AM  Result Value Ref Range   Glucose-Capillary 134 (H) 70 - 99 mg/dL  Glucose, capillary     Status: Abnormal   Collection Time: 03/12/20  8:16 AM  Result Value Ref Range   Glucose-Capillary 111 (H) 70 - 99 mg/dL  Glucose, capillary     Status: Abnormal   Collection Time: 03/12/20 11:52 AM  Result Value Ref Range   Glucose-Capillary 123 (H) 70 - 99 mg/dL    No results found for this or any previous visit (from the past 240 hour(s)).   Radiology Studies: No results found. VAS [place] UPPER EXTREMITY VENOUS DUPLEX  Final Result    DG CHEST PORT 1 VIEW  Final Result    DG Abd 1 View  Final Result    DG Chest Port 1 View  Final Result    DG Chest Port 1 View  Final Result    DG CHEST PORT 1 VIEW  Final Result    CT CHEST W CONTRAST  Final Result    DG CHEST PORT 1 VIEW  Final Result    MR BRAIN W WO CONTRAST  Final Result    DG CHEST PORT 1 VIEW  Final Result    DG Abd Portable 1V  Final Result    [place] EKG SITE RITE  Final Result    CT HEAD W & WO CONTRAST  Final Result    DG Chest Port 1 View  Final Result      Scheduled Meds: . amLODipine  5 mg Per Tube Daily  . bethanechol  10 mg Per Tube TID  . Chlorhexidine Gluconate Cloth  6 each Topical Daily  . cloNIDine  0.1 mg Per Tube Daily  . docusate  100 mg Per Tube BID  . enoxaparin (LOVENOX) injection  40 mg Subcutaneous Q24H  . feeding supplement (PROSource TF)   90 mL Per Tube TID  . insulin aspart  0-15 Units Subcutaneous Q4H  . lacosamide  100 mg Per Tube BID  . pantoprazole sodium  40 mg Per Tube Daily  . PHENObarbital  90 mg Per Tube BID  . QUEtiapine  200 mg Per Tube BID   PRN Meds: sodium chloride, acetaminophen **OR** acetaminophen, bisacodyl, diazepam, hydrALAZINE, ondansetron **OR** ondansetron (ZOFRAN) IV, oxyCODONE, polyethylene glycol, promethazine, Resource ThickenUp Clear Continuous Infusions: . sodium chloride Stopped (02/26/20 2221)  . sodium chloride Stopped (03/02/20 1734)  . feeding supplement (OSMOLITE 1.5 CAL) 1,000 mL (03/10/20 0251)     LOS: 30 days  Time spent: Greater than 50% of the 35 minute visit was spent in counseling/coordination of care for the patient as laid out in the A&P.   Dwyane Dee, MD [facility] 03/12/2020, 11:58 AM

## 2020-03-13 DIAGNOSIS — I159 Secondary hypertension, unspecified: Secondary | ICD-10-CM | POA: Diagnosis not present

## 2020-03-13 DIAGNOSIS — R4182 Altered mental status, unspecified: Secondary | ICD-10-CM | POA: Diagnosis not present

## 2020-03-13 LAB — BASIC METABOLIC PANEL WITH GFR
Anion gap: 10 (ref 5–15)
BUN: 13 mg/dL (ref 6–20)
CO2: 24 mmol/L (ref 22–32)
Calcium: 8.8 mg/dL — ABNORMAL LOW (ref 8.9–10.3)
Chloride: 101 mmol/L (ref 98–111)
Creatinine, Ser: 0.61 mg/dL (ref 0.61–1.24)
GFR, Estimated: >60 mL/min
Glucose, Bld: 121 mg/dL — ABNORMAL HIGH (ref 70–99)
Potassium: 4.1 mmol/L (ref 3.5–5.1)
Sodium: 135 mmol/L (ref 135–145)

## 2020-03-13 LAB — CBC WITH DIFFERENTIAL/PLATELET
Abs Immature Granulocytes: 0.29 10*3/uL — ABNORMAL HIGH (ref 0.00–0.07)
Basophils Absolute: 0.2 10*3/uL — ABNORMAL HIGH (ref 0.0–0.1)
Basophils Relative: 2 %
Eosinophils Absolute: 1.1 10*3/uL — ABNORMAL HIGH (ref 0.0–0.5)
Eosinophils Relative: 12 %
HCT: 41.3 % (ref 39.0–52.0)
Hemoglobin: 14.5 g/dL (ref 13.0–17.0)
Immature Granulocytes: 3 %
Lymphocytes Relative: 20 %
Lymphs Abs: 1.9 10*3/uL (ref 0.7–4.0)
MCH: 30.9 pg (ref 26.0–34.0)
MCHC: 35.1 g/dL (ref 30.0–36.0)
MCV: 88.1 fL (ref 80.0–100.0)
Monocytes Absolute: 1 10*3/uL (ref 0.1–1.0)
Monocytes Relative: 10 %
Neutro Abs: 4.9 10*3/uL (ref 1.7–7.7)
Neutrophils Relative %: 53 %
Platelets: 569 10*3/uL — ABNORMAL HIGH (ref 150–400)
RBC: 4.69 MIL/uL (ref 4.22–5.81)
RDW: 13.5 % (ref 11.5–15.5)
WBC: 9.3 10*3/uL (ref 4.0–10.5)
nRBC: 0 % (ref 0.0–0.2)

## 2020-03-13 LAB — GLUCOSE, CAPILLARY
Glucose-Capillary: 101 mg/dL — ABNORMAL HIGH (ref 70–99)
Glucose-Capillary: 108 mg/dL — ABNORMAL HIGH (ref 70–99)
Glucose-Capillary: 113 mg/dL — ABNORMAL HIGH (ref 70–99)
Glucose-Capillary: 118 mg/dL — ABNORMAL HIGH (ref 70–99)
Glucose-Capillary: 127 mg/dL — ABNORMAL HIGH (ref 70–99)
Glucose-Capillary: 129 mg/dL — ABNORMAL HIGH (ref 70–99)
Glucose-Capillary: 129 mg/dL — ABNORMAL HIGH (ref 70–99)

## 2020-03-13 LAB — MAGNESIUM: Magnesium: 2.2 mg/dL (ref 1.7–2.4)

## 2020-03-13 NOTE — Progress Notes (Signed)
Consult Progress Note    Antonio Glenn   IOX:[id number]  DOB: 29-Mar-1960  DOA: 02/11/2020     31  PCP: Leonard Downing, MD   Hospital Course: Antonio Glenn is a 59 yo male with PMH of obesity; admitted on 02/11/2020, with complaint of seizures and confusion, was found to have brain mass s/p craniotomy on 12/1//2021. After extubation patient was referred to Hospitalist for medical management. 11/25 admission for seizures 12/1 craniotomy of the left parietal tumor, remains intubated 12/3 core track insertion 12/4 LTM EEG no seizures but epileptogenicity from left temporal region 12/5 prelim pathology report GBM, sent to [facility] for further evaluation, report not available. 12/9 family meeting and conversation with DNR and no intubation. 12/15 extubated to room air 12/17 PCCM signed off, TRH assuming medical care, low-grade fever, resolved on its own. Daughter converted patient back to FULL CODE. 12/19 right upper extremity Doppler shows SVT, no DVT.  Currently further plan is monitor for improvement in mentation  Interval History:  No events overnight.  Remains restrained in bed and speech is very dysarthric.  He is able to state his name still but otherwise cannot answer any other questions which is pretty much his baseline.  Old records reviewed in assessment of this patient  ROS: Review of systems not obtained due to patient factors. Cognitive impairment  Assessment & Plan: Acute metabolic encephalopathy - Likely polypharmacy, CNS tumor as well as recent surgery. Meds changed - Continue Seroquel and phenobarbital. - 12/19 opioids changed from scheduled to as needed.  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 Neurology was consulted, currently on Vimpat and phenobarbital for seizures. EEG negative for active  seizures.  Acute respiratory failure with hypoxia, POA secondary to encephalopathy. Needed intubation initially. Extubated on 12/15  MRSA HAP - resolved Low-grade fever on 12/17, resolved on its own Prior pneumonia, treated with IV vancomycin. Course completed.  Paroxysmal A. Fib Currently rate controlled. Anticoagulation on hold due to CNS mass. Monitor.  Thoracic aortic aneurysm Incidental diagnosis. Repeat CT scan in 6 months.  Goals of care conversation Difficult social situation. Currently patient unable to participate in goals of care conversation. Initially based on the discussion with PCCM and the family patient was DNR; now he has been changed back to Full code  Hypokalemia. -Replete and recheck as needed  Dysphagia. -Has been on tube feeds -Repeat SLP eval, still unsafe for POs due to mentation - continue NPO and TF  Accelerated hypertension. Blood pressure was uncontrolled, now improved Started on clonidine, will continue. Continue Norvasc; adjusting dose due to BP still slightly above goal  As needed hydralazine.  Right upper extremity SVT. No evidence of DVT. No need for anticoagulation. Not a candidate for anticoagulation regardless.  Pain control. Now on oxycodone 5-10 q6P; scheduled oxycodone has been discontinued  Low-grade temperature. Intermittent. Likely from SVT vs central fever  X-ray negative   DVT prophylaxis: Lovenox Code Status: Full   Objective: Blood pressure (!) 139/93, pulse 100, temperature 98.1 F (36.7 C), temperature source Oral, resp. rate 16, height 6\' 3"  (1.905 m), weight 134.6 kg, SpO2 96 %.  Examination: General appearance: Awake and talks at times but only can answer his name.  He follows commands intermittently but not very complex ones.  Restraints remain in place for patient safety Head: Normocephalic, without obvious abnormality, atraumatic Eyes: EOMI Lungs: scattered coarse breath sounds, no  wheezing Heart: regular rate and rhythm and S1, S2 normal Abdomen: obese, soft, NT, BS present Extremities: no edema Skin: diaphoretic, intact Neurologic: unable to fully evaluate; follows some commands intermittently; staring around the room  Data Reviewed: I have personally reviewed following labs and imaging studies Results for orders placed or performed during the hospital encounter of 02/11/20 (from the past 24 hour(s))  Glucose, capillary     Status: Abnormal   Collection Time: 03/12/20  3:10 PM  Result Value Ref Range   Glucose-Capillary 105 (H) 70 - 99 mg/dL  Glucose, capillary     Status: Abnormal   Collection Time: 03/12/20  7:40 PM  Result Value Ref Range   Glucose-Capillary 122 (H) 70 - 99 mg/dL  Glucose, capillary     Status: Abnormal   Collection Time: 03/12/20 11:42 PM  Result Value Ref Range   Glucose-Capillary 120 (H) 70 - 99 mg/dL  Glucose, capillary     Status: Abnormal   Collection Time: 03/13/20  3:20 AM  Result Value Ref Range   Glucose-Capillary 108 (H) 70 - 99 mg/dL  Glucose, capillary     Status: Abnormal   Collection Time: 03/13/20  7:36 AM  Result Value Ref Range   Glucose-Capillary 118 (H) 70 - 99 mg/dL   Comment 1 Notify RN    Comment 2 Document in Chart     No results found for this or any previous visit (from the past 240 hour(s)).   Radiology Studies: No results found. VAS [place] UPPER EXTREMITY VENOUS DUPLEX  Final Result    DG CHEST PORT 1 VIEW  Final Result    DG Abd 1 View  Final Result    DG Chest Port 1 View  Final Result    DG Chest Port 1 View  Final Result    DG CHEST PORT 1 VIEW  Final Result    CT CHEST W CONTRAST  Final Result    DG CHEST PORT 1 VIEW  Final Result    MR BRAIN W WO CONTRAST  Final Result    DG CHEST PORT 1 VIEW  Final Result    DG Abd Portable 1V  Final Result    [place] EKG SITE RITE  Final Result    CT HEAD W & WO CONTRAST  Final Result    DG Chest Port 1 View  Final Result       Scheduled Meds: . amLODipine  10 mg Per Tube Daily  . bethanechol  10 mg Per Tube TID  . Chlorhexidine Gluconate Cloth  6 each Topical Daily  . cloNIDine  0.1 mg Per Tube Daily  . docusate  100 mg Per Tube BID  . enoxaparin (LOVENOX) injection  40 mg Subcutaneous Q24H  . feeding supplement (PROSource TF)  90 mL Per Tube TID  . insulin aspart  0-15 Units Subcutaneous Q4H  . lacosamide  100 mg Per Tube BID  . pantoprazole sodium  40 mg Per Tube Daily  . PHENObarbital  90 mg Per Tube BID  . QUEtiapine  200 mg Per Tube BID   PRN Meds: sodium chloride, acetaminophen **OR** acetaminophen, bisacodyl, diazepam, hydrALAZINE, ondansetron **OR** ondansetron (ZOFRAN) IV, oxyCODONE, polyethylene glycol, promethazine, Resource ThickenUp Clear Continuous Infusions: . sodium chloride Stopped (02/26/20 2221)  . sodium chloride Stopped (03/02/20 1734)  . feeding supplement (OSMOLITE 1.5 CAL) 1,000 mL (03/13/20 1105)     LOS: 31 days  Time spent: Greater than 50% of the 35 minute visit was spent in counseling/coordination of  care for the patient as laid out in the A&P.   Dwyane Dee, MD [facility] 03/13/2020, 12:47 PM

## 2020-03-13 NOTE — NC FL2 (Signed)
[place] LEVEL OF CARE SCREENING TOOL     IDENTIFICATION  Patient Name: Antonio Glenn Birthdate: 1960-05-18 Sex: male Admission Date (Current Location): 02/11/2020  [facility] and [place] Number:  Herbalist and Address:  [facility] [place]. [facility], [place] [address], [place], [place] [address]      Provider Number: [id number]  Attending Physician Name and Address:  [name], MD  Relative Name and Phone Number:       Current Level of Care: Hospital Recommended Level of Care: [place] Prior Approval Number:    Date Approved/Denied: 03/13/20 PASRR Number: [id number]  Discharge Plan: Home    Current Diagnoses: Patient Active Problem List   Diagnosis Date Noted  . HTN (hypertension) 03/11/2020  . Respiratory failure ([place])   . HAP (hospital-acquired pneumonia)   . Altered mental status 02/11/2020  . Seizures ([place])   . Encephalopathy acute   . Brain mass   . New onset a-fib (H[facility])     Orientation RESPIRATION BLADDER Height & Weight        Normal Continent Weight: 296 lb 11.8 oz (134.6 kg) Height:  6\' 3"  (190.5 cm)  BEHAVIORAL SYMPTOMS/MOOD NEUROLOGICAL BOWEL NUTRITION STATUS    Convulsions/Seizures Continent Diet (NPO, honey thick water)  AMBULATORY STATUS COMMUNICATION OF NEEDS Skin   Extensive Assist Verbally (Very limited) Normal                       Personal Care Assistance Level of Assistance  Bathing,Feeding,Dressing Bathing Assistance: Maximum assistance Feeding assistance: Limited assistance Dressing Assistance: Maximum assistance     Functional Limitations Info             SPECIAL CARE FACTORS FREQUENCY  PT (By licensed PT),OT (By licensed OT)     PT Frequency: 5x weekly OT Frequency: 5x weekly            Contractures Contractures Info: Not present    Additional Factors Info  Code Status,Allergies Code Status Info: Full Allergies Info: NKDA           Current  Medications (03/13/2020):  This is the current hospital active medication list Current Facility-Administered Medications  Medication Dose Route Frequency Provider Last Rate Last Admin  . 0.9 %  sodium chloride infusion   Intravenous PRN Cristal Generous, NP   Stopped at 02/26/20 2221  . 0.9 %  sodium chloride infusion  250 mL Intravenous Continuous Stretch, Marily Lente, MD   Stopped at 03/02/20 1734  . acetaminophen (TYLENOL) tablet 650 mg  650 mg Per Tube Q4H PRN Kipp Brood, MD   650 mg at 03/05/20 2109   Or  . acetaminophen (TYLENOL) suppository 650 mg  650 mg Rectal Q4H PRN Kipp Brood, MD      . amLODipine (NORVASC) tablet 10 mg  10 mg Per Tube Daily Dwyane Dee, MD      . bethanechol (URECHOLINE) tablet 10 mg  10 mg Per Tube TID Olalere, Adewale A, MD   10 mg at 03/12/20 2230  . bisacodyl (DULCOLAX) suppository 10 mg  10 mg Rectal Daily PRN Merlene Laughter F, NP   10 mg at 02/23/20 1414  . Chlorhexidine Gluconate Cloth 2 % PADS 6 each  6 each Topical Daily Kipp Brood, MD   6 each at 03/12/20 0850  . cloNIDine (CATAPRES) tablet 0.1 mg  0.1 mg Per Tube Daily Lavina Hamman, MD   0.1 mg at 03/12/20 0848  .  diazepam (VALIUM) tablet 5 mg  5 mg Per Tube Q12H PRN Olalere, Adewale A, MD   5 mg at 03/12/20 1539  . docusate (COLACE) 50 MG/5ML liquid 100 mg  100 mg Per Tube BID Kipp Brood, MD   100 mg at 03/12/20 0848  . enoxaparin (LOVENOX) injection 40 mg  40 mg Subcutaneous Q24H [name], MD   40 mg at 03/12/20 0849  . feeding supplement (OSMOLITE 1.5 CAL) liquid 1,000 mL  1,000 mL Per Tube Continuous Olalere, Adewale A, MD 55 mL/hr at 03/10/20 0251 1,000 mL at 03/10/20 0251  . feeding supplement (PROSource TF) liquid 90 mL  90 mL Per Tube TID Brand Males, MD   90 mL at 03/12/20 2142  . hydrALAZINE (APRESOLINE) injection 10 mg  10 mg Intravenous Q6H PRN Lavina Hamman, MD      . insulin aspart (novoLOG) injection 0-15 Units  0-15 Units Subcutaneous Q4H [name], MD   2 Units at 03/12/20 2141  . lacosamide (VIMPAT) tablet 100 mg  100 mg Per Tube BID [name], MD   100 mg at 03/12/20 2142  . ondansetron (ZOFRAN) tablet 4 mg  4 mg Per Tube Q4H PRN Kipp Brood, MD       Or  . ondansetron (ZOFRAN) injection 4 mg  4 mg Intravenous Q4H PRN Kipp Brood, MD   4 mg at 02/29/20 0015  . oxyCODONE (Oxy IR/ROXICODONE) immediate release tablet 5-10 mg  5-10 mg Per Tube Q6H PRN Lavina Hamman, MD   10 mg at 03/13/20 0201  . pantoprazole sodium (PROTONIX) 40 mg/20 mL oral suspension 40 mg  40 mg Per Tube Daily Kipp Brood, MD   40 mg at 03/12/20 0849  . PHENObarbital 20 MG/5ML elixir 90 mg  90 mg Per Tube BID Lora Havens, MD   90 mg at 03/12/20 2142  . polyethylene glycol (MIRALAX / GLYCOLAX) packet 17 g  17 g Per Tube Daily PRN [name], MD   17 g at 02/29/20 1253  . promethazine (PHENERGAN) tablet 12.5-25 mg  12.5-25 mg Per Tube Q4H PRN Kipp Brood, MD      . QUEtiapine (SEROQUEL) tablet 200 mg  200 mg Per Tube BID Rigoberto Noel, MD   200 mg at 03/12/20 2142  . Resource ThickenUp Clear   Oral PRN [name], MD         Discharge Medications: Please see discharge summary for a list of discharge medications.  Relevant Imaging Results:  Relevant Lab Results:   [facility], LCSW

## 2020-03-13 NOTE — TOC Progression Note (Signed)
Transition of Care [facility]) - Progression Note    Patient Details  Name: Antonio Glenn MRN: [id number] Date of Birth: 1960-06-01  Transition of Care [facility]) CM/SW [place], LCSW Phone Number: 03/13/2020, 9:16 AM  Clinical Narrative: CSW spoke with patient's daughter regarding SNF referrals. CSW provided information on how SNF referrals work and provided information on the kind of treatment to expect. CSW noted family was in support of SNF referrals due to no family being nearby that could stay with him. CSW noted they would prefer patient be close to home in [place] but would be open to other options if higher rated. CSW informed daughter they will begin the referral process and will reach out with updates/offers as they are available.      Expected Discharge Plan: Skilled Nursing Facility Barriers to Discharge: SNF Pending bed offer  Expected Discharge Plan and Services Expected Discharge Plan: [place]   Discharge Planning Services: CM Consult Post Acute Care Choice: [place] Living arrangements for the past 2 months: Single Family Home                                       Social Determinants of Health (SDOH) Interventions    Readmission Risk Interventions No flowsheet data found.

## 2020-03-13 NOTE — Progress Notes (Signed)
Subjective: Patient reports no headache  Objective: Vital signs in last 24 hours: Temp:  [98.1 F (36.7 C)-98.7 F (37.1 C)] 98.1 F (36.7 C) (12/26 0738) Pulse Rate:  [90-106] 100 (12/26 0738) Resp:  [16-18] 16 (12/26 0738) BP: (130-145)/(77-95) 139/93 (12/26 0738) SpO2:  [94 %-99 %] 96 % (12/26 0738)  Intake/Output from previous day: 12/25 0701 - 12/26 0700 In: 495 [NG/GT:495] Out: 1000 [Urine:1000] Intake/Output this shift: Total I/O In: 8340 [NG/GT:8340] Out: -   Awake, alert Oriented to person only FC x 4 but difficulty with complex commands Speech meandering Incision c/d  Lab Results: Recent Labs    03/11/20 0233 03/12/20 0051  WBC 9.0 9.1  HGB 13.9 13.6  HCT 42.3 41.9  PLT 502* 525*   BMET Recent Labs    03/11/20 0233 03/12/20 0051  NA 136 137  K 3.9 4.0  CL 102 104  CO2 25 24  GLUCOSE 109* 109*  BUN 18 15  CREATININE 0.66 0.64  CALCIUM 8.6* 8.6*    Studies/Results: No results found.  Assessment/Plan: S/p resection of left parietal tumor - still awaiting path, but likely will require radiation and chemo - awaiting SNF placement   LOS: 31 days     Antonio Glenn 03/13/2020, 11:55 AM

## 2020-03-14 ENCOUNTER — Encounter (HOSPITAL_COMMUNITY): Payer: Self-pay | Admitting: Neurosurgery

## 2020-03-14 DIAGNOSIS — R4182 Altered mental status, unspecified: Secondary | ICD-10-CM | POA: Diagnosis not present

## 2020-03-14 DIAGNOSIS — I159 Secondary hypertension, unspecified: Secondary | ICD-10-CM | POA: Diagnosis not present

## 2020-03-14 DIAGNOSIS — G9340 Encephalopathy, unspecified: Secondary | ICD-10-CM | POA: Diagnosis not present

## 2020-03-14 DIAGNOSIS — G9389 Other specified disorders of brain: Secondary | ICD-10-CM | POA: Diagnosis not present

## 2020-03-14 LAB — BASIC METABOLIC PANEL
Anion gap: 12 (ref 5–15)
BUN: 16 mg/dL (ref 6–20)
CO2: 23 mmol/L (ref 22–32)
Calcium: 8.8 mg/dL — ABNORMAL LOW (ref 8.9–10.3)
Chloride: 103 mmol/L (ref 98–111)
Creatinine, Ser: 0.58 mg/dL — ABNORMAL LOW (ref 0.61–1.24)
GFR, Estimated: >60 mL/min (ref >60)
Glucose, Bld: 109 mg/dL — ABNORMAL HIGH (ref 70–99)
Potassium: 4.1 mmol/L (ref 3.5–5.1)
Sodium: 138 mmol/L (ref 135–145)

## 2020-03-14 LAB — CBC WITH DIFFERENTIAL/PLATELET
Abs Immature Granulocytes: 0.32 10*3/uL — ABNORMAL HIGH (ref 0.00–0.07)
Basophils Absolute: 0.2 10*3/uL — ABNORMAL HIGH (ref 0.0–0.1)
Basophils Relative: 2 %
Eosinophils Absolute: 1 10*3/uL — ABNORMAL HIGH (ref 0.0–0.5)
Eosinophils Relative: 14 %
HCT: 44 % (ref 39.0–52.0)
Hemoglobin: 14.5 g/dL (ref 13.0–17.0)
Immature Granulocytes: 4 %
Lymphocytes Relative: 24 %
Lymphs Abs: 1.8 10*3/uL (ref 0.7–4.0)
MCH: 30.5 pg (ref 26.0–34.0)
MCHC: 33 g/dL (ref 30.0–36.0)
MCV: 92.6 fL (ref 80.0–100.0)
Monocytes Absolute: 0.7 10*3/uL (ref 0.1–1.0)
Monocytes Relative: 10 %
Neutro Abs: 3.4 10*3/uL (ref 1.7–7.7)
Neutrophils Relative %: 46 %
Platelets: 378 10*3/uL (ref 150–400)
RBC: 4.75 MIL/uL (ref 4.22–5.81)
RDW: 13.4 % (ref 11.5–15.5)
WBC: 7.3 10*3/uL (ref 4.0–10.5)
nRBC: 0 % (ref 0.0–0.2)

## 2020-03-14 LAB — GLUCOSE, CAPILLARY
Glucose-Capillary: 116 mg/dL — ABNORMAL HIGH (ref 70–99)
Glucose-Capillary: 129 mg/dL — ABNORMAL HIGH (ref 70–99)
Glucose-Capillary: 139 mg/dL — ABNORMAL HIGH (ref 70–99)
Glucose-Capillary: 110 mg/dL — ABNORMAL HIGH (ref 70–99)
Glucose-Capillary: 110 mg/dL — ABNORMAL HIGH (ref 70–99)
Glucose-Capillary: 103 mg/dL — ABNORMAL HIGH (ref 70–99)
Glucose-Capillary: 151 mg/dL — ABNORMAL HIGH (ref 70–99)

## 2020-03-14 LAB — SURGICAL PATHOLOGY

## 2020-03-14 LAB — MAGNESIUM: Magnesium: 2.2 mg/dL (ref 1.7–2.4)

## 2020-03-14 NOTE — Progress Notes (Signed)
Subjective: NAEs o/n  Objective: Vital signs in last 24 hours: Temp:  [97.7 F (36.5 C)-98.9 F (37.2 C)] 97.7 F (36.5 C) (12/27 0339) Pulse Rate:  [92-110] 99 (12/27 0339) Resp:  [10-22] 13 (12/27 0339) BP: (131-159)/(80-93) 159/93 (12/27 0339) SpO2:  [96 %] 96 % (12/26 2342) Weight:  [133.4 kg] 133.4 kg (12/27 0359)  Intake/Output from previous day: 12/26 0701 - 12/27 0700 In: 8550 [NG/GT:8550] Out: 475 [Urine:475] Intake/Output this shift: No intake/output data recorded.  Alert, Ox1 FC x 4, mild right sided neglect Speech meandering Incision healing well  Lab Results: Recent Labs    03/13/20 1814 03/14/20 0332  WBC 9.3 7.3  HGB 14.5 14.5  HCT 41.3 44.0  PLT 569* 378   BMET Recent Labs    03/13/20 1814 03/14/20 0332  NA 135 138  K 4.1 4.1  CL 101 103  CO2 24 23  GLUCOSE 121* 109*  BUN 13 16  CREATININE 0.61 0.58*  CALCIUM 8.8* 8.8*    Studies/Results: No results found.  Assessment/Plan: L parietal lobe tumor s/p rsxn - awaiting path - awaiting SNF - possible speech re-eval today for swallowing   Bedelia Person 03/14/2020, 5:26 AM

## 2020-03-14 NOTE — Progress Notes (Signed)
Consult Progress Note    Antonio Glenn   WFU:[id number]  DOB: December 17, 1960  DOA: 02/11/2020     32  PCP: Antonio Downing, MD   Hospital Course: Antonio Glenn is a 59 yo male with PMH of obesity; admitted on 02/11/2020, with complaint of seizures and confusion, was found to have brain mass s/p craniotomy on 12/1//2021. After extubation patient was referred to Hospitalist for medical management. 11/25 admission for seizures 12/1 craniotomy of the left parietal tumor, remains intubated 12/3 core track insertion 12/4 LTM EEG no seizures but epileptogenicity from left temporal region 12/5 prelim pathology report GBM, sent to [facility] for further evaluation, report not available. 12/9 family meeting and conversation with DNR and no intubation. 12/15 extubated to room air 12/17 PCCM signed off, TRH assuming medical care, low-grade fever, resolved on its own. Daughter converted patient back to FULL CODE. 12/19 right upper extremity Doppler shows SVT, no DVT.  Currently further plan is monitor for improvement in mentation. Awaiting discharge to rehab/CIR.   Interval History:  No events overnight. No change from yesterday. Knows name consistently; every now and then he'll know he's in [place]. Follows commands with LUE more than RUE.   Old records reviewed in assessment of this patient  ROS: Review of systems not obtained due to patient factors. Cognitive impairment  Assessment & Plan: Acute metabolic encephalopathy - Likely polypharmacy, CNS tumor as well as recent surgery. Meds changed - Continue Seroquel and phenobarbital. - 12/19 opioids changed from scheduled to as needed.  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 Neurology was consulted, currently on Vimpat and phenobarbital for seizures. EEG negative for active  seizures.  Acute respiratory failure with hypoxia, POA secondary to encephalopathy. Needed intubation initially. Extubated on 12/15  MRSA HAP - resolved Low-grade fever on 12/17, resolved on its own Prior pneumonia, treated with IV vancomycin. Course completed.  Paroxysmal A. Fib Currently rate controlled. Anticoagulation on hold due to CNS mass. Monitor.  Thoracic aortic aneurysm Incidental diagnosis. Repeat CT scan in 6 months.  Goals of care conversation Difficult social situation. Currently patient unable to participate in goals of care conversation. Initially based on the discussion with PCCM and the family patient was DNR; now he has been changed back to Full code  Hypokalemia. -Replete and recheck as needed  Dysphagia. -Has been on tube feeds -Repeat SLP eval, still unsafe for POs due to mentation - continue NPO and TF  Accelerated hypertension. Blood pressure was uncontrolled, now improved Started on clonidine, will continue. Continue Norvasc; adjusting dose due to BP still slightly above goal  As needed hydralazine.  Right upper extremity SVT. No evidence of DVT. No need for anticoagulation. Not a candidate for anticoagulation regardless.  Pain control. Now on oxycodone 5-10 q6P; scheduled oxycodone has been discontinued  Low-grade temperature. Intermittent. Likely from SVT vs central fever  X-ray negative   DVT prophylaxis: Lovenox Code Status: Full   Objective: Blood pressure (!) 159/93, pulse 99, temperature 97.7 F (36.5 C), temperature source Oral, resp. rate 13, height 6\' 3"  (1.905 m), weight 133.4 kg, SpO2 96 %.  Examination: General appearance: Awake and talks at times but only can answer his name.  He follows commands intermittently (bettter in LUE) but not very complex ones.  Restraints remain in place for patient safety Head: Normocephalic, without obvious abnormality, atraumatic Eyes: EOMI Lungs: scattered coarse breath  sounds, no  wheezing Heart: regular rate and rhythm and S1, S2 normal Abdomen: obese, soft, NT, BS present Extremities: no edema Skin: diaphoretic, intact Neurologic: follows some commands intermittently (LUE)  Data Reviewed: I have personally reviewed following labs and imaging studies Results for orders placed or performed during the hospital encounter of 02/11/20 (from the past 24 hour(s))  Glucose, capillary     Status: Abnormal   Collection Time: 03/13/20  4:53 PM  Result Value Ref Range   Glucose-Capillary 113 (H) 70 - 99 mg/dL  CBC with Differential/Platelet     Status: Abnormal   Collection Time: 03/13/20  6:14 PM  Result Value Ref Range   WBC 9.3 4.0 - 10.5 K/uL   RBC 4.69 4.22 - 5.81 MIL/uL   Hemoglobin 14.5 13.0 - 17.0 g/dL   HCT 93.2 67.1 - 24.5 %   MCV 88.1 80.0 - 100.0 fL   MCH 30.9 26.0 - 34.0 pg   MCHC 35.1 30.0 - 36.0 g/dL   RDW 80.9 98.3 - 38.2 %   Platelets 569 (H) 150 - 400 K/uL   nRBC 0.0 0.0 - 0.2 %   Neutrophils Relative % 53 %   Neutro Abs 4.9 1.7 - 7.7 K/uL   Lymphocytes Relative 20 %   Lymphs Abs 1.9 0.7 - 4.0 K/uL   Monocytes Relative 10 %   Monocytes Absolute 1.0 0.1 - 1.0 K/uL   Eosinophils Relative 12 %   Eosinophils Absolute 1.1 (H) 0.0 - 0.5 K/uL   Basophils Relative 2 %   Basophils Absolute 0.2 (H) 0.0 - 0.1 K/uL   Immature Granulocytes 3 %   Abs Immature Granulocytes 0.29 (H) 0.00 - 0.07 K/uL  Magnesium     Status: None   Collection Time: 03/13/20  6:14 PM  Result Value Ref Range   Magnesium 2.2 1.7 - 2.4 mg/dL  Basic metabolic panel     Status: Abnormal   Collection Time: 03/13/20  6:14 PM  Result Value Ref Range   Sodium 135 135 - 145 mmol/L   Potassium 4.1 3.5 - 5.1 mmol/L   Chloride 101 98 - 111 mmol/L   CO2 24 22 - 32 mmol/L   Glucose, Bld 121 (H) 70 - 99 mg/dL   BUN 13 6 - 20 mg/dL   Creatinine, Ser 5.05 0.61 - 1.24 mg/dL   Calcium 8.8 (L) 8.9 - 10.3 mg/dL   GFR, Estimated >39 >76 mL/min   Anion gap 10 5 - 15  Glucose,  capillary     Status: Abnormal   Collection Time: 03/13/20  8:36 PM  Result Value Ref Range   Glucose-Capillary 101 (H) 70 - 99 mg/dL  Glucose, capillary     Status: Abnormal   Collection Time: 03/13/20 11:44 PM  Result Value Ref Range   Glucose-Capillary 127 (H) 70 - 99 mg/dL  CBC with Differential/Platelet     Status: Abnormal   Collection Time: 03/14/20  3:32 AM  Result Value Ref Range   WBC 7.3 4.0 - 10.5 K/uL   RBC 4.75 4.22 - 5.81 MIL/uL   Hemoglobin 14.5 13.0 - 17.0 g/dL   HCT 73.4 19.3 - 79.0 %   MCV 92.6 80.0 - 100.0 fL   MCH 30.5 26.0 - 34.0 pg   MCHC 33.0 30.0 - 36.0 g/dL   RDW 24.0 97.3 - 53.2 %   Platelets 378 150 - 400 K/uL   nRBC 0.0 0.0 - 0.2 %   Neutrophils Relative % 46 %   Neutro Abs 3.4 1.7 -  7.7 K/uL   Lymphocytes Relative 24 %   Lymphs Abs 1.8 0.7 - 4.0 K/uL   Monocytes Relative 10 %   Monocytes Absolute 0.7 0.1 - 1.0 K/uL   Eosinophils Relative 14 %   Eosinophils Absolute 1.0 (H) 0.0 - 0.5 K/uL   Basophils Relative 2 %   Basophils Absolute 0.2 (H) 0.0 - 0.1 K/uL   Immature Granulocytes 4 %   Abs Immature Granulocytes 0.32 (H) 0.00 - 0.07 K/uL  Magnesium     Status: None   Collection Time: 03/14/20  3:32 AM  Result Value Ref Range   Magnesium 2.2 1.7 - 2.4 mg/dL  Basic metabolic panel     Status: Abnormal   Collection Time: 03/14/20  3:32 AM  Result Value Ref Range   Sodium 138 135 - 145 mmol/L   Potassium 4.1 3.5 - 5.1 mmol/L   Chloride 103 98 - 111 mmol/L   CO2 23 22 - 32 mmol/L   Glucose, Bld 109 (H) 70 - 99 mg/dL   BUN 16 6 - 20 mg/dL   Creatinine, Ser 0.58 (L) 0.61 - 1.24 mg/dL   Calcium 8.8 (L) 8.9 - 10.3 mg/dL   GFR, Estimated >60 >60 mL/min   Anion gap 12 5 - 15  Glucose, capillary     Status: Abnormal   Collection Time: 03/14/20  3:37 AM  Result Value Ref Range   Glucose-Capillary 116 (H) 70 - 99 mg/dL   Comment 1 Notify RN    Comment 2 Document in Chart   Glucose, capillary     Status: Abnormal   Collection Time: 03/14/20  9:12  AM  Result Value Ref Range   Glucose-Capillary 129 (H) 70 - 99 mg/dL  Glucose, capillary     Status: Abnormal   Collection Time: 03/14/20 12:28 PM  Result Value Ref Range   Glucose-Capillary 139 (H) 70 - 99 mg/dL    No results found for this or any previous visit (from the past 240 hour(s)).   Radiology Studies: No results found. VAS [place] UPPER EXTREMITY VENOUS DUPLEX  Final Result    DG CHEST PORT 1 VIEW  Final Result    DG Abd 1 View  Final Result    DG Chest Port 1 View  Final Result    DG Chest Port 1 View  Final Result    DG CHEST PORT 1 VIEW  Final Result    CT CHEST W CONTRAST  Final Result    DG CHEST PORT 1 VIEW  Final Result    MR BRAIN W WO CONTRAST  Final Result    DG CHEST PORT 1 VIEW  Final Result    DG Abd Portable 1V  Final Result    [place] EKG SITE RITE  Final Result    CT HEAD W & WO CONTRAST  Final Result    DG Chest Port 1 View  Final Result    DG Swallowing Func-Speech Pathology    (Results Pending)    Scheduled Meds: . amLODipine  10 mg Per Tube Daily  . bethanechol  10 mg Per Tube TID  . Chlorhexidine Gluconate Cloth  6 each Topical Daily  . cloNIDine  0.1 mg Per Tube Daily  . docusate  100 mg Per Tube BID  . enoxaparin (LOVENOX) injection  40 mg Subcutaneous Q24H  . feeding supplement (PROSource TF)  90 mL Per Tube TID  . insulin aspart  0-15 Units Subcutaneous Q4H  . lacosamide  100 mg Per Tube BID  .  pantoprazole sodium  40 mg Per Tube Daily  . PHENObarbital  90 mg Per Tube BID  . QUEtiapine  200 mg Per Tube BID   PRN Meds: sodium chloride, acetaminophen **OR** acetaminophen, bisacodyl, diazepam, hydrALAZINE, ondansetron **OR** ondansetron (ZOFRAN) IV, oxyCODONE, polyethylene glycol, promethazine, Resource ThickenUp Clear Continuous Infusions: . sodium chloride Stopped (02/26/20 2221)  . sodium chloride Stopped (03/02/20 1734)  . feeding supplement (OSMOLITE 1.5 CAL) 1,000 mL (03/14/20 0945)     LOS: 32 days  Time  spent: Greater than 50% of the 35 minute visit was spent in counseling/coordination of care for the patient as laid out in the A&P.   Dwyane Dee, MD [facility] 03/14/2020, 3:17 PM

## 2020-03-14 NOTE — Progress Notes (Signed)
Physical Therapy Treatment Patient Details Name: Antonio Glenn MRN: [id number] DOB: 17-Dec-1960 Today's Date: 03/14/2020    History of Present Illness Antonio Glenn is a 59 y.o. male with hx of recently diagnosed brain tumor who was transferred from [place] ED to [facility] 11/25 with AMS and seizures.CRANIOTOMY OF  LEFT PARIETAL TUMOR 12/1 and on vent, trach off vent 12/15    PT Comments    Pt with regression today from mobility stand point. Pt more restless, impulsive, and easily frustrated today limiting pt ability to participate in therapy. Pt with worsening R sided weakness and awareness today with neglect requiring maxA to place R foot in optimal position in sitting and to prep for standing. Pt with word finding difficulty but was able to state [place] when presented a picture and attempted to say christmas tree when shown to patient as well. Continue to recommend CIR Up d/c for maximal functional recovery. Acute PT to cont to follow.   Follow Up Recommendations  CIR     Equipment Recommendations   (TBD at next venue)    Recommendations for Other Services Rehab consult     Precautions / Restrictions Precautions Precautions: Fall Precaution Comments: bilat wrist restraints and mittens, posey belt, condom cath Restrictions Weight Bearing Restrictions: No    Mobility  Bed Mobility Overal bed mobility: Needs Assistance Bed Mobility: Supine to Sit;Sit to Supine     Supine to sit: Mod assist;+2 for physical assistance Sit to supine: Min assist;+2 for physical assistance   General bed mobility comments: max directional verbal and tactile cues to achieve transfer to EOB, modA for trunk elevation and for safety as pt slid near EOB,  Transfers Overall transfer level: Needs assistance Equipment used: Rolling walker (2 wheeled) Transfers: Sit to/from Stand Sit to Stand: Mod assist;Max assist;+2 physical assistance         General transfer comment: attempted sit to stand  transfer x 3, pt refusing to let PT and tech assist however pt unable to keep R foot flat on the ground to be able to push up from. PT and tech attempted to stabilize walker as pt was trying to pull up on it however pt was impulsive, unsafe and becoming frustrated becasue we were "in the way" unable to achieve standing this date  Ambulation/Gait                 Stairs             Wheelchair Mobility    Modified Rankin (Stroke Patients Only)       Balance Overall balance assessment: Needs assistance Sitting-balance support: Feet supported;No upper extremity supported;Bilateral upper extremity supported Sitting balance-Leahy Scale: Fair Sitting balance - Comments: pt wtih L lateral bias, pt impulsive and restless, moving quickly anteriorly in trying to adjust self at EOB       Standing balance comment: unable to achieve today                            Cognition Arousal/Alertness: Lethargic;Awake/alert Behavior During Therapy: Impulsive;Restless Overall Cognitive Status: Impaired/Different from baseline Area of Impairment: Orientation;Attention;Memory;Following commands;Safety/judgement;Awareness;Problem solving                 Orientation Level: Situation;Time;Place Current Attention Level: Focused Memory: Decreased recall of precautions;Decreased short-term memory Following Commands: Follows one step commands with increased time;Follows one step commands inconsistently Safety/Judgement: Decreased awareness of safety;Decreased awareness of deficits Awareness: Intellectual Problem Solving: Slow processing;Decreased  initiation;Difficulty sequencing;Requires verbal cues;Requires tactile cues General Comments: pt with inability to comprend simple commands consistently causing pt to become very frustrated limiting pt's ability to participate. it appears pt's vision is impaired or he is hallucinating as pt reaching for things with R UE and then getting  frustrated he can't get it or when PT would tell him nothing is there      Exercises      General Comments General comments (skin integrity, edema, etc.): VSS      Pertinent Vitals/Pain Pain Assessment: Faces Faces Pain Scale: No hurt    Home Living                      Prior Function            PT Goals (current goals can now be found in the care plan section) Progress towards PT goals: Not progressing toward goals - comment    Frequency    Min 4X/week      PT Plan Current plan remains appropriate    Co-evaluation              AM-PAC PT "6 Clicks" Mobility   Outcome Measure  Help needed turning from your back to your side while in a flat bed without using bedrails?: A Lot Help needed moving from lying on your back to sitting on the side of a flat bed without using bedrails?: A Lot Help needed moving to and from a bed to a chair (including a wheelchair)?: A Lot Help needed standing up from a chair using your arms (e.g., wheelchair or bedside chair)?: Total Help needed to walk in hospital room?: Total Help needed climbing 3-5 steps with a railing? : Total 6 Click Score: 9    End of Session Equipment Utilized During Treatment: Gait belt Activity Tolerance: Treatment limited secondary to agitation Patient left: in bed;with call bell/phone within reach;with bed alarm set;with restraints reapplied Nurse Communication: Mobility status PT Visit Diagnosis: Unsteadiness on feet (R26.81);Other abnormalities of gait and mobility (R26.89);Difficulty in walking, not elsewhere classified (R26.2);Other symptoms and signs involving the nervous system (R29.898) Hemiplegia - Right/Left: Right Hemiplegia - dominant/non-dominant: Dominant Hemiplegia - caused by: Unspecified     Time: 1103-1594 PT Time Calculation (min) (ACUTE ONLY): 28 min  Charges:  $Therapeutic Activity: 8-22 mins $Neuromuscular Re-education: 8-22 mins                     Lewis Shock,  PT, DPT Acute Rehabilitation Services Pager #: [phone] Office #: [phone]    Iona Hansen 03/14/2020, 2:52 PM

## 2020-03-14 NOTE — Progress Notes (Signed)
   03/14/20 2330  Assess: MEWS Score  Temp 97.8 F (36.6 C)  BP 129/84  Pulse Rate (!) 109  ECG Heart Rate (!) 112  Resp 20  Level of Consciousness Alert  SpO2 98 %  O2 Device Room Air  Assess: MEWS Score  MEWS Temp 0  MEWS Systolic 0  MEWS Pulse 2  MEWS RR 0  MEWS LOC 0  MEWS Score 2  MEWS Score Color Yellow  Assess: if the MEWS score is Yellow or Red  Were vital signs taken at a resting state? Yes (patient restless and moving)  Focused Assessment No change from prior assessment  Early Detection of Sepsis Score *See Row Information* Low  MEWS guidelines implemented *See Row Information* Yes  Treat  Pain Scale PAINAD  Pain Score 2  Breathing 0  Negative Vocalization 1  Facial Expression 0  Body Language 1  Consolability 0  PAINAD Score 2  Take Vital Signs  Increase Vital Sign Frequency  Yellow: Q 2hr X 2 then Q 4hr X 2, if remains yellow, continue Q 4hrs  Escalate  MEWS: Escalate Yellow: discuss with charge nurse/RN and consider discussing with provider and RRT  Notify: Charge Nurse/RN  Name of Charge Nurse/RN Notified Gladys, RN  Date Charge Nurse/RN Notified 03/14/20  Time Charge Nurse/RN Notified 2333  Document  Patient Outcome Other (Comment) (RN at bedside; will work on Brewing technologist)  Progress note created (see row info) Yes

## 2020-03-14 NOTE — TOC Progression Note (Signed)
Transition of Care [facility]) - Progression Note    Patient Details  Name: Antonio Glenn MRN: [id number] Date of Birth: [birth year]  [facility] CM/SW Contact  Antonio Salina Mila Homer, LCSW Phone Number: 03/14/2020, 3:16 PM  Clinical Narrative:  CSW received information that patient's ex-wife was requesting to speak with social worker. CSW reviewed chart and could not locate a phone number for ex-wife, but did read that she is also HCPOA for patient. Call made to daughter and was provided with her name and phone number - Antonio Glenn - [phone]. Call made to Ms. Preast and talked with her regarding SNF placement and current bed offers. Ms. Merry was provided with the names of SNF's that made a bed offers and she expressed interest in [facility]. Ms. Mester was advised that this facility would be contacted.  3:13 pm: [facility] contacted and message left for admissions staff person [facility].    Expected Discharge Plan: Skilled Nursing Facility Barriers to Discharge: SNF Pending bed offer  Expected Discharge Plan and Services Expected Discharge Plan: [place]   Discharge Planning Services: CM Consult Post Acute Care Choice: [place] Living arrangements for the past 2 months: Single Family Home                                       Social Determinants of Health (SDOH) Interventions    Readmission Risk Interventions No flowsheet data found.

## 2020-03-14 NOTE — Progress Notes (Signed)
  Speech Language Pathology Treatment: Cognitive-Linquistic;Dysphagia  Patient Details Name: Antonio Glenn MRN: [id number] DOB: 12/05/1960 Today's Date: 03/14/2020 Time: 1638-4665 SLP Time Calculation (min) (ACUTE ONLY): 20 min  Assessment / Plan / Recommendation Clinical Impression  Antonio Glenn remained awake, alert and responsive today as therapy focused on po dysphagia and aphasia. Oral care was followed by bites pudding that he self fed with therapist loading spoon. Initiation of swallow was unremarkable from observation. He exhibited a delayed throat clear and mild delayed cough. He is ready for repeat instrumental assessment to determine need for longer term means of nutrition. MBS cannot be completed today and will plan for tomorrow morning- suspect this may go the way of needing more permanent means. Significant aphasia continues, motor/oral/verbal apraxia. He is able to sometimes communicate his needs with 1-2 intended words mixed with paraphasias, perseverations. He gestured and said "down" in combination with irrelevant words -pt affirmed wanted head lowered. Counted 1-5 independently and 6-10 needed max cues and visual cues not significantly helpful. Errors are inconsistent which is congruent with verbal apraxia. He follows simple commands 60% (motor apraxia impacts also).  MBS tomorrow morning.    HPI HPI: Antonio Glenn is a 59 y.o. male with hx of recently diagnosed brain tumor (left parietal lobe) who was transferred from [place] ED to [facility] 11/25 with AMS and seizures. Underwent resection 12/1. Intubated 12/1-12/15.      SLP Plan  Continue with current plan of care;MBS       Recommendations  Diet recommendations: NPO Medication Administration: Via alternative means                Oral Care Recommendations: Oral care QID Follow up Recommendations: Skilled Nursing facility SLP Visit Diagnosis: Dysphagia, oropharyngeal phase (R13.12);Aphasia (R47.01) Plan: Continue with  current plan of care;MBS                       Houston Siren 03/14/2020, 11:15 AM   Orbie Pyo Colvin Caroli.Ed Risk analyst [phone] Office [phone]

## 2020-03-15 ENCOUNTER — Other Ambulatory Visit: Payer: Self-pay | Admitting: Radiation Therapy

## 2020-03-15 ENCOUNTER — Inpatient Hospital Stay (HOSPITAL_COMMUNITY): Payer: BC Managed Care – PPO

## 2020-03-15 DIAGNOSIS — I159 Secondary hypertension, unspecified: Secondary | ICD-10-CM | POA: Diagnosis not present

## 2020-03-15 LAB — CBC WITH DIFFERENTIAL/PLATELET
Abs Immature Granulocytes: 0.43 10*3/uL — ABNORMAL HIGH (ref 0.00–0.07)
Basophils Absolute: 0.2 10*3/uL — ABNORMAL HIGH (ref 0.0–0.1)
Basophils Relative: 2 %
Eosinophils Absolute: 1 10*3/uL — ABNORMAL HIGH (ref 0.0–0.5)
Eosinophils Relative: 10 %
HCT: 39.8 % (ref 39.0–52.0)
Hemoglobin: 13.8 g/dL (ref 13.0–17.0)
Immature Granulocytes: 4 %
Lymphocytes Relative: 16 %
Lymphs Abs: 1.5 10*3/uL (ref 0.7–4.0)
MCH: 30.7 pg (ref 26.0–34.0)
MCHC: 34.7 g/dL (ref 30.0–36.0)
MCV: 88.4 fL (ref 80.0–100.0)
Monocytes Absolute: 1 10*3/uL (ref 0.1–1.0)
Monocytes Relative: 11 %
Neutro Abs: 5.6 10*3/uL (ref 1.7–7.7)
Neutrophils Relative %: 57 %
Platelets: 558 10*3/uL — ABNORMAL HIGH (ref 150–400)
RBC: 4.5 MIL/uL (ref 4.22–5.81)
RDW: 13.2 % (ref 11.5–15.5)
WBC: 9.8 10*3/uL (ref 4.0–10.5)
nRBC: 0 % (ref 0.0–0.2)

## 2020-03-15 LAB — MAGNESIUM: Magnesium: 2.1 mg/dL (ref 1.7–2.4)

## 2020-03-15 LAB — GLUCOSE, CAPILLARY
Glucose-Capillary: 108 mg/dL — ABNORMAL HIGH (ref 70–99)
Glucose-Capillary: 122 mg/dL — ABNORMAL HIGH (ref 70–99)
Glucose-Capillary: 123 mg/dL — ABNORMAL HIGH (ref 70–99)
Glucose-Capillary: 129 mg/dL — ABNORMAL HIGH (ref 70–99)
Glucose-Capillary: 163 mg/dL — ABNORMAL HIGH (ref 70–99)
Glucose-Capillary: 85 mg/dL (ref 70–99)

## 2020-03-15 NOTE — Progress Notes (Signed)
Physical Therapy Treatment Patient Details Name: Antonio Glenn MRN: [id number] DOB: May 13, 1960 Today's Date: 03/15/2020    History of Present Illness Antonio Glenn is a 59 y.o. male with hx of recently diagnosed brain tumor who was transferred from [facility] ED to [facility] 11/25 with AMS and seizures.CRANIOTOMY OF  LEFT PARIETAL TUMOR 12/1 and on vent, trach off vent 12/15    PT Comments    Pt with improved mood and cooperation with therapy today. Pt continues to demonstrate word finding difficulty, R sided neglect, impaired processing and sequencing requiring significant increased time and max verbal cues to don socks, wash face, take steps. Pt remains at significant falls risk and continues to require assistx2 for safe OOB mobility. Continue to recommend CIR upon d/c for maximal functional and cognitive recovery. Acute PT to cont to follow.    Follow Up Recommendations  CIR     Equipment Recommendations   (TBD at next venue)    Recommendations for Other Services Rehab consult     Precautions / Restrictions Precautions Precautions: Fall Precaution Comments: bilat wrist restraints, posey belt, NG, R neglect Restrictions Weight Bearing Restrictions: No    Mobility  Bed Mobility Overal bed mobility: Needs Assistance Bed Mobility: Supine to Sit     Supine to sit: Mod assist;+2 for physical assistance     General bed mobility comments: max directional verbal and tactile cues to achieve transfer to EOB, assist to initiate LEs towards EOB but once initiated pt initiating elevating trunk with assist  Transfers Overall transfer level: Needs assistance Equipment used: 2 person hand held assist Transfers: Sit to/from Stand;Stand Pivot Transfers Sit to Stand: Mod assist;+2 physical assistance Stand pivot transfers: Mod assist;+2 physical assistance;+2 safety/equipment       General transfer comment: assist to boost and steady with mod cues initially to initiate standing with  assist. pt transferring to recliner placed to his R, he requires max tactile/verbal cues to turn his head and locate recliner prior to taking steps. given cues pt initiates taking small side steps towards recliner  Ambulation/Gait             General Gait Details: took 5 steps to chair with max verbal and tactile cues to sequence stepping, pt was weightshifting L/R while standing   Stairs             Wheelchair Mobility    Modified Rankin (Stroke Patients Only)       Balance Overall balance assessment: Needs assistance Sitting-balance support: Feet supported;No upper extremity supported;Bilateral upper extremity supported Sitting balance-Leahy Scale: Fair Sitting balance - Comments: pt much improved today   Standing balance support: Bilateral upper extremity supported Standing balance-Leahy Scale: Poor Standing balance comment: external assist in standing                            Cognition Arousal/Alertness: Awake/alert Behavior During Therapy: WFL for tasks assessed/performed;Impulsive (more pleasant today but can be quick to move) Overall Cognitive Status: Impaired/Different from baseline Area of Impairment: Orientation;Attention;Memory;Following commands;Safety/judgement;Awareness;Problem solving                 Orientation Level: Situation;Time;Place Current Attention Level: Focused Memory: Decreased recall of precautions;Decreased short-term memory Following Commands: Follows one step commands with increased time;Follows one step commands inconsistently Safety/Judgement: Decreased awareness of safety;Decreased awareness of deficits Awareness: Intellectual Problem Solving: Slow processing;Decreased initiation;Difficulty sequencing;Requires verbal cues;Requires tactile cues General Comments: pt with difficulty sequencing/processing donning socks. continues  also with word finding difficulties and indentifying objects or demonstrating how to use  them, pt more pleasant, cooperative and appreciative of services today      Exercises      General Comments General comments (skin integrity, edema, etc.): VSS      Pertinent Vitals/Pain Pain Assessment: Faces Faces Pain Scale: No hurt Pain Intervention(s): Monitored during session    Home Living                      Prior Function            PT Goals (current goals can now be found in the care plan section) Acute Rehab PT Goals Patient Stated Goal: to get up PT Goal Formulation: With patient Time For Goal Achievement: 03/18/20 Potential to Achieve Goals: Fair Progress towards PT goals: Progressing toward goals    Frequency    Min 4X/week      PT Plan Current plan remains appropriate    Co-evaluation PT/OT/SLP Co-Evaluation/Treatment: Yes Reason for Co-Treatment: Complexity of the patient's impairments (multi-system involvement) PT goals addressed during session: Mobility/safety with mobility OT goals addressed during session: ADL's and self-care      AM-PAC PT "6 Clicks" Mobility   Outcome Measure  Help needed turning from your back to your side while in a flat bed without using bedrails?: A Lot Help needed moving from lying on your back to sitting on the side of a flat bed without using bedrails?: A Lot Help needed moving to and from a bed to a chair (including a wheelchair)?: A Lot Help needed standing up from a chair using your arms (e.g., wheelchair or bedside chair)?: A Lot Help needed to walk in hospital room?: A Lot Help needed climbing 3-5 steps with a railing? : Total 6 Click Score: 11    End of Session Equipment Utilized During Treatment: Gait belt Activity Tolerance: Patient tolerated treatment well Patient left: with restraints reapplied;in chair;with call bell/phone within reach;with chair alarm set (posey and bilat mittens) Nurse Communication: Mobility status PT Visit Diagnosis: Unsteadiness on feet (R26.81);Other abnormalities  of gait and mobility (R26.89);Difficulty in walking, not elsewhere classified (R26.2);Other symptoms and signs involving the nervous system (R29.898) Hemiplegia - Right/Left: Right Hemiplegia - dominant/non-dominant: Dominant Hemiplegia - caused by: Unspecified     Time: [id number] PT Time Calculation (min) (ACUTE ONLY): 36 min  Charges:  $Neuromuscular Re-education: 8-22 mins                     Kittie Plater, PT, DPT Acute Rehabilitation Services Pager #: [phone] Office #: [phone]    Berline Lopes 03/15/2020, 11:28 AM

## 2020-03-15 NOTE — Progress Notes (Signed)
Occupational Therapy Treatment Patient Details Name: Antonio DeitersDaniel Glenn Nater MRN: [id number] DOB: Mar 19, 1961 Today's Date: 03/15/2020    History of present illness Antonio DeitersDaniel Glenn Bowron is a 59 y.o. male with hx of recently diagnosed brain tumor who was [facility] with AMS and seizures.CRANIOTOMY OF  LEFT PARIETAL TUMOR 12/1 and on vent, trach off vent 12/15   OT comments  Pt awake and pleasant this session, cooperative throughout. He remains with cognitive impairments and word finding difficulties, often requiring max multimodal cues to problem solve through object identification and recall of how to use ADL items. Pt tolerating EOB and OOB to recliner with +2 assist. Pt with continued Glenn side neglect/inattention and requires max multimodal cues to look towards/locate items to his Glenn visual field - when formally tested pt is able to correct identify items/number of digits held in his Glenn visual field. VSS throughout. Noted per chart pt does not have 24hr caregiver support at time of discharge therefore is not appropriate candidate for CIR. Have updated d/c recommendations and [facility] Will continue to follow acutely.   Follow Up Recommendations  SNF;Supervision/Assistance - 24 hour    Equipment Recommendations  Other (comment) (TBD)          Precautions / Restrictions Precautions Precautions: Fall Precaution Comments: bilat wrist restraints, posey belt, NG Restrictions Weight Bearing Restrictions: No       Mobility Bed Mobility Overal bed mobility: Needs Assistance Bed Mobility: Supine to Sit     Supine to sit: Mod assist;+2 for physical assistance     General bed mobility comments: max directional verbal and tactile cues to achieve transfer to EOB, assist to initiate LEs towards EOB but once initiated pt initiating elevating trunk with assist  Transfers Overall transfer level: Needs assistance Equipment used: 2 person  hand held assist Transfers: Sit to/from Stand;Stand Pivot Transfers Sit to Stand: Mod assist;+2 physical assistance Stand pivot transfers: Mod assist;+2 physical assistance;+2 safety/equipment       General transfer comment: assist to boost and steady with mod cues initially to initiate standing with assist. pt transferring to recliner placed to his Glenn, he requires max tactile/verbal cues to turn his head and locate recliner prior to taking steps. given cues pt initiates taking small side steps towards recliner    Balance Overall balance assessment: Needs assistance Sitting-balance support: Feet supported;No upper extremity supported;Bilateral upper extremity supported Sitting balance-Leahy Scale: Fair     Standing balance support: Bilateral upper extremity supported Standing balance-Leahy Scale: Poor Standing balance comment: external assist in standing                           ADL either performed or assessed with clinical judgement   ADL Overall ADL's : Needs assistance/impaired     Grooming: Sitting;Wash/dry face;Brushing hair;Moderate assistance Grooming Details (indicate cue type and reason): increased assist/cues to initiate task (and to problem solve through how to use ADL item properly)             Lower Body Dressing: Maximal assistance;+2 for physical assistance;Sit to/from stand Lower Body Dressing Details (indicate cue type and reason): pt donning socks via figure 4 at bed level; max cues to initiate task, required assist to start sock on L foot and then pt able to pull fully over foot, pt with better ability to start sock over Glenn foot but requires assist to fully complete  Functional mobility during ADLs: Moderate assistance;+2 for physical assistance;+2 for safety/equipment (HHA)                         Cognition Arousal/Alertness: Awake/alert Behavior During Therapy: WFL for tasks assessed/performed Overall Cognitive Status:  Impaired/Different from baseline Area of Impairment: Orientation;Attention;Memory;Following commands;Safety/judgement;Awareness;Problem solving                 Orientation Level: Situation;Time;Place Current Attention Level: Focused Memory: Decreased recall of precautions;Decreased short-term memory Following Commands: Follows one step commands with increased time;Follows one step commands inconsistently Safety/Judgement: Decreased awareness of safety;Decreased awareness of deficits Awareness: Intellectual Problem Solving: Slow processing;Decreased initiation;Difficulty sequencing;Requires verbal cues;Requires tactile cues General Comments: pt very pleasant today and cooperative throughout session. pt with notable word finding difficulties, max questioning cues to correctly point to/identify item when presented with two items        Exercises     Shoulder Instructions       General Comments VSS    Pertinent Vitals/ Pain       Pain Assessment: Faces Faces Pain Scale: No hurt Pain Intervention(s): Monitored during session  Home Living                                          Prior Functioning/Environment              Frequency  Min 2X/week        Progress Toward Goals  OT Goals(current goals can now be found in the care plan section)  Progress towards OT goals: Progressing toward goals  Acute Rehab OT Goals Patient Stated Goal: to get up OT Goal Formulation: With patient Time For Goal Achievement: 03/18/20 Potential to Achieve Goals: Good  Plan Discharge plan remains appropriate    Co-evaluation    PT/OT/SLP Co-Evaluation/Treatment: Yes Reason for Co-Treatment: Complexity of the patient's impairments (multi-system involvement);Necessary to address cognition/behavior during functional activity;For patient/therapist safety   OT goals addressed during session: ADL's and self-care      AM-PAC OT "6 Clicks" Daily Activity     Outcome  Measure   Help from another person eating meals?: Total Help from another person taking care of personal grooming?: A Lot Help from another person toileting, which includes using toliet, bedpan, or urinal?: Total Help from another person bathing (including washing, rinsing, drying)?: A Lot Help from another person to put on and taking off regular upper body clothing?: A Lot Help from another person to put on and taking off regular lower body clothing?: A Lot 6 Click Score: 10    End of Session Equipment Utilized During Treatment: Gait belt  OT Visit Diagnosis: Unsteadiness on feet (R26.81);Other abnormalities of gait and mobility (R26.89);Muscle weakness (generalized) (M62.81);Other symptoms and signs involving cognitive function;Low vision, both eyes (H54.2);Cognitive communication deficit (R41.841)   Activity Tolerance Patient tolerated treatment well   Patient Left in chair;with call bell/phone within reach;with chair alarm set;with restraints reapplied   Nurse Communication Mobility status        Time: 2297-9892 OT Time Calculation (min): 35 min  Charges: OT General Charges $OT Visit: 1 Visit OT Treatments $Self Care/Home Management : 8-22 mins  Marcy Siren, OT Acute Rehabilitation Services Pager [phone] Office [phone]   Orlando Penner 03/15/2020, 11:09 AM

## 2020-03-15 NOTE — Progress Notes (Signed)
Physical Therapy Treatment Patient Details Name: Antonio Glenn MRN: [id number] DOB: 03-Feb-1961 Today's Date: 03/15/2020    History of Present Illness DOULGAS BUDNICK is a 59 y.o. male with hx of recently diagnosed brain tumor who was transferred from [place] ED to [facility] 11/25 with AMS and seizures.CRANIOTOMY OF  LEFT PARIETAL TUMOR 12/1 and on vent, trach off vent 12/15    PT Comments    Pt seen a second time by PT today to assist with return to bed after pt found to be in very low position in recliner (restraints still intact). The pt was tachy and diaphoretic at rest upon arrival, required multiple attempts and maxA of 2-3 to safely stand and complete pivot transfer back to bed. Initially attempted steps to R side to return to bed, but due to R-sided neglect, pt unable to initiate steps. Pt then able to complete steps to L side to return to bed where he required maxA to return to supine and reposition in bed. HR decreased and BP stable upon return to supine in bed. The pt will continue to benefit from skilled PT to progress functional strength, power, and activity tolerance to facilitate OOB mobility and gait training.     Follow Up Recommendations  CIR     Equipment Recommendations   (defer to post acute)    Recommendations for Other Services Rehab consult     Precautions / Restrictions Precautions Precautions: Fall Precaution Comments: bilat wrist restraints, posey belt, NG, R neglect Restrictions Weight Bearing Restrictions: No    Mobility  Bed Mobility Overal bed mobility: Needs Assistance Bed Mobility: Sit to Supine     Supine to sit: Mod assist;+2 for physical assistance Sit to supine: Max assist;+2 for physical assistance;HOB elevated   General bed mobility comments: cues to return head to pillow, modA at trunk to reposition and maxA to bring BLE into bed, maxA of 2 to reposition in bed  Transfers Overall transfer level: Needs assistance Equipment used: 2 person  hand held assist Transfers: Sit to/from [facility] Sit to Stand: Mod assist;Max assist;+2 physical assistance Stand pivot transfers: +2 safety/equipment;Mod assist;Max assist       General transfer comment: initially unable to complete stand without maxA of 2. after multiple attempts from recliner pt able to stand with modA of 2 with HHA. Unable to pivot to R, maxA of 2 to pivot to L to return to bed  Ambulation/Gait             General Gait Details: 3-5 small lateral steps to bed from recliner. unable to initiate towards R, but able to go when towards L       Balance Overall balance assessment: Needs assistance Sitting-balance support: Feet supported;No upper extremity supported;Bilateral upper extremity supported Sitting balance-Leahy Scale: Fair Sitting balance - Comments: pt much improved today   Standing balance support: Bilateral upper extremity supported Standing balance-Leahy Scale: Poor Standing balance comment: external assist in standing                            Cognition Arousal/Alertness: Awake/alert Behavior During Therapy: Restless;Impulsive Overall Cognitive Status: Impaired/Different from baseline Area of Impairment: Orientation;Attention;Memory;Following commands;Safety/judgement;Awareness;Problem solving                 Orientation Level: Situation;Time;Place Current Attention Level: Focused Memory: Decreased recall of precautions;Decreased short-term memory Following Commands: Follows one step commands with increased time;Follows one step commands inconsistently Safety/Judgement: Decreased awareness  of safety;Decreased awareness of deficits Awareness: Intellectual Problem Solving: Slow processing;Decreased initiation;Difficulty sequencing;Requires verbal cues;Requires tactile cues General Comments: Pt restless and slightly agitated in poor position in recliner upon arrival of PT/RN. Pt following some commands when  given increaed time, stating "wait a minute" at other times when given cues/commands. difficulty sequencing, word finding, and with consistent neglect/inattention to R side         General Comments General comments (skin integrity, edema, etc.): pt tachy at rest upon arrival (122), max of 135 noted with return to bed      Pertinent Vitals/Pain Pain Assessment: Faces Faces Pain Scale: No hurt Pain Location: general, could not state Pain Descriptors / Indicators: Grimacing Pain Intervention(s): Monitored during session;Limited activity within patient's tolerance;Repositioned           PT Goals (current goals can now be found in the care plan section) Acute Rehab PT Goals Patient Stated Goal: to get up PT Goal Formulation: With patient Time For Goal Achievement: 03/18/20 Potential to Achieve Goals: Fair Progress towards PT goals: Progressing toward goals    Frequency    Min 4X/week      PT Plan Current plan remains appropriate    Co-evaluation PT/OT/SLP Co-Evaluation/Treatment: Yes Reason for Co-Treatment: Complexity of the patient's impairments (multi-system involvement);Necessary to address cognition/behavior during functional activity;For patient/therapist safety;To address functional/ADL transfers PT goals addressed during session: Mobility/safety with mobility OT goals addressed during session: ADL's and self-care      AM-PAC PT "6 Clicks" Mobility   Outcome Measure  Help needed turning from your back to your side while in a flat bed without using bedrails?: A Lot Help needed moving from lying on your back to sitting on the side of a flat bed without using bedrails?: A Lot Help needed moving to and from a bed to a chair (including a wheelchair)?: A Lot Help needed standing up from a chair using your arms (e.g., wheelchair or bedside chair)?: A Lot Help needed to walk in hospital room?: A Lot Help needed climbing 3-5 steps with a railing? : Total 6 Click Score:  11    End of Session Equipment Utilized During Treatment: Gait belt Activity Tolerance: Patient tolerated treatment well Patient left: in bed;with call bell/phone within reach;with restraints reapplied;with bed alarm set Nurse Communication: Mobility status PT Visit Diagnosis: Unsteadiness on feet (R26.81);Other abnormalities of gait and mobility (R26.89);Difficulty in walking, not elsewhere classified (R26.2);Other symptoms and signs involving the nervous system (R29.898) Hemiplegia - Right/Left: Right Hemiplegia - dominant/non-dominant: Dominant Hemiplegia - caused by: Unspecified     Time: 1035-1050 PT Time Calculation (min) (ACUTE ONLY): 15 min  Charges:  $Therapeutic Activity: 8-22 mins $Neuromuscular Re-education: 8-22 mins                     Rolm Baptise, PT, DPT   Acute Rehabilitation Department Pager #: [phone]   Gaetana Michaelis 03/15/2020, 12:53 PM

## 2020-03-15 NOTE — Progress Notes (Signed)
  Speech Language Pathology  Patient Details Name: Antonio Glenn MRN: [id number] DOB: 1960-05-22 Today's Date: 03/15/2020 Time:  -     MBS scheduled today at 11:30              Royce Macadamia 03/15/2020, 8:35 AM     Breck Coons [place] Face.Ed Nurse, children's [phone] Office [phone]

## 2020-03-15 NOTE — Progress Notes (Signed)
Modified Barium Swallow Progress Note  Patient Details  Name: Antonio Glenn MRN: [id number] Date of Birth: 01-31-1961  Today's Date: 03/15/2020  Modified Barium Swallow completed.  Full report located under Chart Review in the Imaging Section.  Brief recommendations include the following:  Clinical Impression  Pt's oropharyngeal abilities have improved since FEES 12/21. Oral phase marked by lingual pumping, minimally decreased cohesion and prolonged mastication of solid. Transiently swallow occured at the valleculae and pyriform sinuses where barium collected for 4-6 seconds before swallow. Thin via straw was aspirated from barium in pyriform sinuses with immediate cough pushing barium towards his epiglottis but not completely clearing. Two trials thin via cup did not enter vestibule however, is not recommended due to his poor cognition and aphasia for consistent execution of strategies. Valleculae and pyriform sinuses residue ranged mild-max (max with solid). A puree texture, nectar thick liquids, pills crushed and FULL supervision is recommended. Cortrak in place until is intake is safe and adequate.   Swallow Evaluation Recommendations       SLP Diet Recommendations: Dysphagia 1 (Puree) solids;Honey thick liquids   Liquid Administration via: Cup   Medication Administration: Crushed with puree   Supervision: Staff to assist with self feeding;Patient able to self feed;Full supervision/cueing for compensatory strategies   Compensations: Slow rate;Small sips/bites;Multiple dry swallows after each bite/sip   Postural Changes: Seated upright at 90 degrees   Oral Care Recommendations: Oral care BID        Royce Macadamia 03/15/2020,1:09 PM  Breck Coons [place].Ed Nurse, children's [phone] Office [phone]

## 2020-03-15 NOTE — TOC Progression Note (Addendum)
Transition of Care [facility]) - Progression Note    Patient Details  Name: Antonio Glenn MRN: [id number] Date of Birth: 01-Mar-1961  [facility] CM/SW Contact  Eduard Roux, [place] Phone Number: 03/15/2020, 12:30 PM  Clinical Narrative:     CSW spoke with Carren Rang via phone- CSW informed [facility] could only accept if the patient is fully vaccinated for covid. Gunnar Fusi states she was not sure and she was going to try and find out then call CSW back.   Gunnar Fusi requested call from the MD. CSW informed the patients nurse of her request.   [name], MSW, LCSW Clinical Social Worker   Expected Discharge Plan: Skilled Nursing Facility Barriers to Discharge: SNF Pending bed offer  Expected Discharge Plan and Services Expected Discharge Plan: Skilled Nursing Facility   Discharge Planning Services: CM Consult Post Acute Care Choice: Skilled Nursing Facility Living arrangements for the past 2 months: Single Family Home                                       Social Determinants of Health (SDOH) Interventions    Readmission Risk Interventions No flowsheet data found.

## 2020-03-15 NOTE — TOC Progression Note (Signed)
Transition of Care [facility]) - Progression Note    Patient Details  Name: Antonio Glenn MRN: [id number] Date of Birth: [birth year]  Transition of Care [facility]) CM/SW Contact  Eduard Roux, [place] Phone Number: 03/15/2020, 3:03 PM  Clinical Narrative:    Received call form [name](ex-wife) she states the patient is not vaccinated. CSW explained he was unable to discharge to [facility], since he was not vaccinated per SNF. She was agreeable to [facility] SNF.  CSW waiting on confirmation of bed offer from SNF.   Patient will need insurance authorization prior to discharge Patient will need covid test  Patient will need to be out of mittens or any restraints for at least 24 hrs before discharge to SNF.  CSW wil continue to follow and assist with discharge planning.  [name], MSW, LCSW Clinical Social Worker     Expected Discharge Plan: Skilled Nursing Facility Barriers to Discharge: SNF Pending bed offer  Expected Discharge Plan and Services Expected Discharge Plan: Skilled Nursing Facility   Discharge Planning Services: CM Consult Post Acute Care Choice: Skilled Nursing Facility Living arrangements for the past 2 months: Single Family Home                                       Social Determinants of Health (SDOH) Interventions    Readmission Risk Interventions No flowsheet data found.

## 2020-03-15 NOTE — Progress Notes (Signed)
  NEUROSURGERY PROGRESS NOTE   No issues overnight. No concerns this am  EXAM:  BP (!) 151/91 (BP Location: Right Arm)   Pulse 88   Temp 98.4 F (36.9 C) (Oral)   Resp 16   Ht 6\' 3"  (1.905 m)   Wt 133.6 kg   SpO2 98%   BMI 36.81 kg/m   Awake, alert Oriented to self only Expressive aphasia MAE, seemingly equal strength Incision: c/d/i  IMPRESSION/PLAN 59 y.o. male  S/p resection of left parietal lobe tumor. Neurologically stable. - continue supportive care - SLP evaluation yesterday. Patient to remain NPO

## 2020-03-15 NOTE — Progress Notes (Signed)
Consult Progress Note    Antonio Glenn   HGD:[id number]  DOB: 1960-12-22  DOA: 02/11/2020     32  PCP: Antonio Downing, MD   Hospital Course: Antonio Glenn is a 59 yo male with PMH of obesity; admitted on 02/11/2020, with complaint of seizures and confusion, was found to have brain mass s/p craniotomy on 12/1//2021. After extubation patient was referred to Hospitalist for medical management. 11/25 admission for seizures 12/1 craniotomy of the left parietal tumor, remains intubated 12/3 core track insertion 12/4 LTM EEG no seizures but epileptogenicity from left temporal region 12/5 prelim pathology report GBM, sent to [facility] for further evaluation, report not available. 12/9 family meeting and conversation with DNR and no intubation. 12/15 extubated to room air 12/17 PCCM signed off, TRH assuming medical care, low-grade fever, resolved on its own. Daughter converted patient back to FULL CODE. 12/19 right upper extremity Doppler shows SVT, no DVT. 12/27: path back consistent with GBM, grade 4. MBSS performed (patient passed for first time and started on dysphagia 1 diet)  Currently further plan is monitor for improvement in mentation. Awaiting discharge to rehab/CIR.   Interval History:  Right side neglect more obvious today. Sitting up in recliner; still has mittens in place; knows name only.   Old records reviewed in assessment of this patient  ROS: Review of systems not obtained due to patient factors. Cognitive impairment  Assessment & Plan: Acute metabolic encephalopathy- resolved - patient now likely at new baseline (which is rather poor) - Likely CNS tumor (now GBM by path), polypharmacy - Continue Seroquel and phenobarbital. - 12/19 opioids changed from scheduled to as needed.  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 Poor prognosis  and typically average of 2 year survival - given patient consistently failing swallow evaluations, requiring restraints and mittens to not pull out tubes, and severe debility with right-sided neglect, palliative care consultation warranted at this time to at least begin some discussions on how aggressive family would want to be especially if patient needing PEG tube (of note, patient has been hospitalized 32 days at this point and appears that he may have just passed a modified barium swallow, being started on dysphagia 1 diet on 03/15/2020).  Regardless, involving palliative care given underlying GBM is appropriate - Vimpat and phenobarbital for seizures. EEG negative for active seizures.  Acute respiratory failure with hypoxia, POA secondary to encephalopathy. Needed intubation initially. Extubated on 12/15  MRSA HAP - resolved Low-grade fever on 12/17, resolved on its own Prior pneumonia, treated with IV vancomycin. Course completed.  Paroxysmal A. Fib Currently rate controlled. Anticoagulation on hold due to CNS mass. Monitor.  Thoracic aortic aneurysm Incidental diagnosis. Repeat CT scan in 6 months.  Goals of care conversation Difficult social situation. Currently patient unable to participate in goals of care conversation. Initially based on the discussion with PCCM and the family patient was DNR; now he has been changed back to Full code. See GBM above   Hypokalemia. -Replete and recheck as needed  Dysphagia. -Has been on tube feeds - MBSS on 12/28 (trial of dysphagia 1 diet now) - continue TF until taking in adequate and sufficient diet (need PCM to assist with family discussion if a PEG becomes needed given that patient has required restraints consistently)  Accelerated hypertension. Blood pressure was uncontrolled, now improved Started on clonidine, will continue. Continue Norvasc; adjusting dose due to BP  still slightly above goal  As needed  hydralazine.  Right upper extremity SVT. No evidence of DVT. No need for anticoagulation. Not a candidate for anticoagulation regardless.  Pain control. Now on oxycodone 5-10 q6P; scheduled oxycodone has been discontinued  Low-grade temperature. Intermittent. Likely from SVT vs central fever  X-ray negative   DVT prophylaxis: Lovenox Code Status: Full   Objective: Blood pressure (!) 151/91, pulse 88, temperature 98.4 F (36.9 C), temperature source Oral, resp. rate 16, height 6' 3" (1.905 m), weight 133.6 kg, SpO2 98 %.  Examination: General appearance: Sitting up in recliner this morning more awake.  Very obvious right-sided neglect today. Ongoing expressive aphasia   Head: Normocephalic, without obvious abnormality, atraumatic Eyes: EOMI Lungs: clear to auscultation bilaterally Heart: regular rate and rhythm and S1, S2 normal Abdomen: obese, soft, NT, BS present Extremities: no edema Skin: diaphoretic, intact Neurologic: follows some commands intermittently (LUE); right sided neglect; expressive aphasia  Data Reviewed: I have personally reviewed following labs and imaging studies Results for orders placed or performed during the hospital encounter of 02/11/20 (from the past 24 hour(s))  Glucose, capillary     Status: Abnormal   Collection Time: 03/14/20 12:28 PM  Result Value Ref Range   Glucose-Capillary 139 (H) 70 - 99 mg/dL  Glucose, capillary     Status: Abnormal   Collection Time: 03/14/20  3:41 PM  Result Value Ref Range   Glucose-Capillary 110 (H) 70 - 99 mg/dL  Glucose, capillary     Status: Abnormal   Collection Time: 03/14/20  5:32 PM  Result Value Ref Range   Glucose-Capillary 110 (H) 70 - 99 mg/dL  Glucose, capillary     Status: Abnormal   Collection Time: 03/14/20  8:13 PM  Result Value Ref Range   Glucose-Capillary 103 (H) 70 - 99 mg/dL  Glucose, capillary     Status: Abnormal   Collection Time: 03/14/20 11:22 PM  Result Value Ref Range    Glucose-Capillary 151 (H) 70 - 99 mg/dL  CBC with Differential/Platelet     Status: Abnormal   Collection Time: 03/15/20 12:57 AM  Result Value Ref Range   WBC 9.8 4.0 - 10.5 K/uL   RBC 4.50 4.22 - 5.81 MIL/uL   Hemoglobin 13.8 13.0 - 17.0 g/dL   HCT 39.8 39.0 - 52.0 %   MCV 88.4 80.0 - 100.0 fL   MCH 30.7 26.0 - 34.0 pg   MCHC 34.7 30.0 - 36.0 g/dL   RDW 13.2 11.5 - 15.5 %   Platelets 558 (H) 150 - 400 K/uL   nRBC 0.0 0.0 - 0.2 %   Neutrophils Relative % 57 %   Neutro Abs 5.6 1.7 - 7.7 K/uL   Lymphocytes Relative 16 %   Lymphs Abs 1.5 0.7 - 4.0 K/uL   Monocytes Relative 11 %   Monocytes Absolute 1.0 0.1 - 1.0 K/uL   Eosinophils Relative 10 %   Eosinophils Absolute 1.0 (H) 0.0 - 0.5 K/uL   Basophils Relative 2 %   Basophils Absolute 0.2 (H) 0.0 - 0.1 K/uL   Immature Granulocytes 4 %   Abs Immature Granulocytes 0.43 (H) 0.00 - 0.07 K/uL  Magnesium     Status: None   Collection Time: 03/15/20 12:57 AM  Result Value Ref Range   Magnesium 2.1 1.7 - 2.4 mg/dL  Glucose, capillary     Status: Abnormal   Collection Time: 03/15/20  4:31 AM  Result Value Ref Range   Glucose-Capillary 122 (H) 70 - 99  mg/dL  Glucose, capillary     Status: Abnormal   Collection Time: 03/15/20  8:30 AM  Result Value Ref Range   Glucose-Capillary 108 (H) 70 - 99 mg/dL    No results found for this or any previous visit (from the past 240 hour(s)).   Radiology Studies: No results found. VAS [place] UPPER EXTREMITY VENOUS DUPLEX  Final Result    DG CHEST PORT 1 VIEW  Final Result    DG Abd 1 View  Final Result    DG Chest Port 1 View  Final Result    DG Chest Port 1 View  Final Result    DG CHEST PORT 1 VIEW  Final Result    CT CHEST W CONTRAST  Final Result    DG CHEST PORT 1 VIEW  Final Result    MR BRAIN W WO CONTRAST  Final Result    DG CHEST PORT 1 VIEW  Final Result    DG Abd Portable 1V  Final Result    [place] EKG SITE RITE  Final Result    CT HEAD W & WO CONTRAST  Final  Result    DG Chest Port 1 View  Final Result    DG Swallowing Func-Speech Pathology    (Results Pending)    Scheduled Meds: . amLODipine  10 mg Per Tube Daily  . bethanechol  10 mg Per Tube TID  . Chlorhexidine Gluconate Cloth  6 each Topical Daily  . cloNIDine  0.1 mg Per Tube Daily  . docusate  100 mg Per Tube BID  . enoxaparin (LOVENOX) injection  40 mg Subcutaneous Q24H  . feeding supplement (PROSource TF)  90 mL Per Tube TID  . insulin aspart  0-15 Units Subcutaneous Q4H  . lacosamide  100 mg Per Tube BID  . pantoprazole sodium  40 mg Per Tube Daily  . PHENObarbital  90 mg Per Tube BID  . QUEtiapine  200 mg Per Tube BID   PRN Meds: sodium chloride, acetaminophen **OR** acetaminophen, bisacodyl, diazepam, hydrALAZINE, ondansetron **OR** ondansetron (ZOFRAN) IV, oxyCODONE, polyethylene glycol, promethazine, Resource ThickenUp Clear Continuous Infusions: . sodium chloride Stopped (02/26/20 2221)  . sodium chloride Stopped (03/02/20 1734)  . feeding supplement (OSMOLITE 1.5 CAL) 1,000 mL (03/14/20 0945)     LOS: 33 days  Time spent: Greater than 50% of the 35 minute visit was spent in counseling/coordination of care for the patient as laid out in the A&P.   Dwyane Dee, MD [facility] 03/15/2020, 9:45 AM

## 2020-03-15 NOTE — Progress Notes (Addendum)
Pt's ex-wife, Gunnar Fusi, called requesting update on pt from an MD - including pathology results. Her contact info has been added to Epic and it has been explained for 1 family member to take updates. Gunnar Fusi also called Aram Beecham, Child psychotherapist, requesting update. [facility] hospital[facility], Dr. Frederick Peers, contacted and deferred to neurosurgery to give family updates. Message left with [facility].   Gunnar Fusi also claims to be pt's POA, but has yet to provide documentation. Today on the phone she agreed to send some paperwork in. Note from Sophronia Simas, RN, on 12/23 states that Gunnar Fusi was told at that time to provide paperwork.   Robina Ade, RN

## 2020-03-15 NOTE — Progress Notes (Signed)
Pathology has come back as IDH wild-type, GBM.  I called the daughter, Alyson Ingles, and had an extensive discussion discussing the pathology, treatment options which included chemotherapy and radiation versus comfort care.  They would like to speak to neuro oncology for further details of treatment.  The patient is close to being ready for rehab.  I extensively discussed the expected outcomes and prognosis of IDH wild-type GBM.  I answered all of her questions.  She would like to discuss further with Dr. Mickeal Skinner, I will reach out to him for further discussions.    Elwin Sleight, DO Neurosurgeon

## 2020-03-16 DIAGNOSIS — G9389 Other specified disorders of brain: Secondary | ICD-10-CM | POA: Diagnosis not present

## 2020-03-16 DIAGNOSIS — Z0189 Encounter for other specified special examinations: Secondary | ICD-10-CM | POA: Diagnosis not present

## 2020-03-16 DIAGNOSIS — C719 Malignant neoplasm of brain, unspecified: Secondary | ICD-10-CM

## 2020-03-16 LAB — CBC WITH DIFFERENTIAL/PLATELET
Abs Immature Granulocytes: 0 10*3/uL (ref 0.00–0.07)
Basophils Absolute: 0.4 10*3/uL — ABNORMAL HIGH (ref 0.0–0.1)
Basophils Relative: 4 %
Eosinophils Absolute: 1 10*3/uL — ABNORMAL HIGH (ref 0.0–0.5)
Eosinophils Relative: 10 %
HCT: 41.6 % (ref 39.0–52.0)
Hemoglobin: 13.6 g/dL (ref 13.0–17.0)
Lymphocytes Relative: 22 %
Lymphs Abs: 2.3 10*3/uL (ref 0.7–4.0)
MCH: 29.7 pg (ref 26.0–34.0)
MCHC: 32.7 g/dL (ref 30.0–36.0)
MCV: 90.8 fL (ref 80.0–100.0)
Monocytes Absolute: 0.9 10*3/uL (ref 0.1–1.0)
Monocytes Relative: 9 %
Neutro Abs: 5.7 10*3/uL (ref 1.7–7.7)
Neutrophils Relative %: 55 %
Platelets: 564 10*3/uL — ABNORMAL HIGH (ref 150–400)
RBC: 4.58 MIL/uL (ref 4.22–5.81)
RDW: 13.5 % (ref 11.5–15.5)
WBC: 10.4 10*3/uL (ref 4.0–10.5)
nRBC: 0 % (ref 0.0–0.2)
nRBC: 0 /100 WBC

## 2020-03-16 LAB — COMPREHENSIVE METABOLIC PANEL
ALT: 70 U/L — ABNORMAL HIGH (ref 0–44)
AST: 28 U/L (ref 15–41)
Albumin: 2.7 g/dL — ABNORMAL LOW (ref 3.5–5.0)
Alkaline Phosphatase: 159 U/L — ABNORMAL HIGH (ref 38–126)
Anion gap: 10 (ref 5–15)
BUN: 15 mg/dL (ref 6–20)
CO2: 25 mmol/L (ref 22–32)
Calcium: 8.9 mg/dL (ref 8.9–10.3)
Chloride: 104 mmol/L (ref 98–111)
Creatinine, Ser: 0.62 mg/dL (ref 0.61–1.24)
GFR, Estimated: >60 mL/min (ref >60)
Glucose, Bld: 108 mg/dL — ABNORMAL HIGH (ref 70–99)
Potassium: 3.9 mmol/L (ref 3.5–5.1)
Sodium: 139 mmol/L (ref 135–145)
Total Bilirubin: 0.4 mg/dL (ref 0.3–1.2)
Total Protein: 6.1 g/dL — ABNORMAL LOW (ref 6.5–8.1)

## 2020-03-16 LAB — GLUCOSE, CAPILLARY
Glucose-Capillary: 109 mg/dL — ABNORMAL HIGH (ref 70–99)
Glucose-Capillary: 99 mg/dL (ref 70–99)
Glucose-Capillary: 145 mg/dL — ABNORMAL HIGH (ref 70–99)
Glucose-Capillary: 121 mg/dL — ABNORMAL HIGH (ref 70–99)
Glucose-Capillary: 113 mg/dL — ABNORMAL HIGH (ref 70–99)
Glucose-Capillary: 137 mg/dL — ABNORMAL HIGH (ref 70–99)

## 2020-03-16 LAB — MAGNESIUM: Magnesium: 2.1 mg/dL (ref 1.7–2.4)

## 2020-03-16 MED ORDER — ENSURE ENLIVE PO LIQD
237.0000 mL | Freq: Two times a day (BID) | ORAL | Status: DC
  Administered 2020-03-16 – 2020-03-31 (×19): 237 mL via ORAL

## 2020-03-16 MED ORDER — OSMOLITE 1.5 CAL PO LIQD
960.0000 mL | ORAL | Status: DC
  Administered 2020-03-16 – 2020-03-21 (×7): 960 mL
  Filled 2020-03-16 (×7): qty 1000

## 2020-03-16 NOTE — Progress Notes (Addendum)
Physical Therapy Treatment Patient Details Name: Antonio Glenn MRN: [id number] DOB: [birth year] Today's Date: 03/16/2020    History of Present Illness Antonio Glenn is a 59 y.o. male with hx of recently diagnosed brain tumor who was transferred from [facility] to [facility] 11/25 with AMS and seizures.CRANIOTOMY OF  LEFT PARIETAL TUMOR 12/1 and on vent, trach off vent 12/15    PT Comments    Pt sleeping on arrival, but easy to wake. Pt required +2 mod assist supine to sit and +2 mod assist sit to stand HHA. Pt unable to progress LLE for gait once standing and returned to sitting EOB. Once seated, pt became mildly agitated. He was scooting his hips forward off the bed and was difficult to redirect. Pt required return to supine +2 mod assist. Pt agitation better managed if he was able/willing to initiate the movement. Once in supine, pt immediately fell asleep. CIR denied due to pt not having 24-hour assist available at home. D/C recommendation updated to SNF.    Follow Up Recommendations  SNF     Equipment Recommendations  Other (comment) (defer to post acute)    Recommendations for Other Services       Precautions / Restrictions Precautions Precautions: Fall;Other (comment) Precaution Comments: bilat wrist restraints, posey belt, NG, R neglect    Mobility  Bed Mobility Overal bed mobility: Needs Assistance Bed Mobility: Supine to Sit;Sit to Supine     Supine to sit: Mod assist;+2 for physical assistance;+2 for safety/equipment Sit to supine: Mod assist;+2 for physical assistance;+2 for safety/equipment   General bed mobility comments: cues for sequencing, assist with BLE and trunk  Transfers Overall transfer level: Needs assistance Equipment used: 2 person hand held assist Transfers: Sit to/from Stand Sit to Stand: Mod assist;+2 physical assistance         General transfer comment: Pt unable to progress LLE for sidestepping once in static stand.  Ambulation/Gait                  Stairs             Wheelchair Mobility    Modified Rankin (Stroke Patients Only)       Balance Overall balance assessment: Needs assistance Sitting-balance support: Feet supported;No upper extremity supported Sitting balance-Leahy Scale: Fair     Standing balance support: Bilateral upper extremity supported;During functional activity Standing balance-Leahy Scale: Poor Standing balance comment: reliant on external support                            Cognition Arousal/Alertness: Awake/alert Behavior During Therapy: Restless;Impulsive;Agitated Overall Cognitive Status: Impaired/Different from baseline Area of Impairment: Orientation;Attention;Memory;Following commands;Safety/judgement;Awareness;Problem solving                 Orientation Level: Disoriented to;Place;Time;Situation Current Attention Level: Focused Memory: Decreased recall of precautions;Decreased short-term memory Following Commands: Follows one step commands with increased time;Follows one step commands inconsistently Safety/Judgement: Decreased awareness of safety;Decreased awareness of deficits Awareness: Intellectual Problem Solving: Slow processing;Decreased initiation;Difficulty sequencing;Requires verbal cues;Requires tactile cues General Comments: Sleeping on arrival but easily arousable. Pt startled awake. During session, pt restless and mildly agitated. Difficult to redirect and easily frustrated.      Exercises      General Comments        Pertinent Vitals/Pain Pain Assessment: Faces Faces Pain Scale: No hurt    Home Living  Prior Function            PT Goals (current goals can now be found in the care plan section) Acute Rehab PT Goals Patient Stated Goal: not stated Progress towards PT goals: Progressing toward goals    Frequency    Min 3X/week      PT Plan Frequency needs to be updated;Discharge plan needs  to be updated    Co-evaluation              AM-PAC PT "6 Clicks" Mobility   Outcome Measure  Help needed turning from your back to your side while in a flat bed without using bedrails?: A Lot Help needed moving from lying on your back to sitting on the side of a flat bed without using bedrails?: A Lot Help needed moving to and from a bed to a chair (including a wheelchair)?: A Lot Help needed standing up from a chair using your arms (e.g., wheelchair or bedside chair)?: A Lot Help needed to walk in hospital room?: Total Help needed climbing 3-5 steps with a railing? : Total 6 Click Score: 10    End of Session Equipment Utilized During Treatment: Gait belt Activity Tolerance: Treatment limited secondary to agitation Patient left: in bed;with call bell/phone within reach;with bed alarm set Nurse Communication: Mobility status PT Visit Diagnosis: Unsteadiness on feet (R26.81);Other abnormalities of gait and mobility (R26.89);Difficulty in walking, not elsewhere classified (R26.2);Other symptoms and signs involving the nervous system (R29.898) Hemiplegia - Right/Left: Right Hemiplegia - dominant/non-dominant: Dominant Hemiplegia - caused by: Unspecified     Time: 9166-0600 PT Time Calculation (min) (ACUTE ONLY): 19 min  Charges:  $Therapeutic Activity: 8-22 mins                     Aida Raider, PT  Office # [phone] Pager [phone]    Ilda Foil 03/16/2020, 12:53 PM

## 2020-03-16 NOTE — TOC Progression Note (Signed)
Transition of Care [facility]) - Progression Note    Patient Details  Name: TRACEY STEWART MRN: [id number] Date of Birth: Jul 18, 1960  [facility] CM/SW Contact  Eduard Roux, [place] Phone Number: 03/16/2020, 11:02 AM  Clinical Narrative:     [facility] has confirmed bed offer once the patient is medically stable.   [name], MSW, LCSW Clinical Social Worker   Expected Discharge Plan: Skilled Nursing Facility Barriers to Discharge: SNF Pending bed offer  Expected Discharge Plan and Services Expected Discharge Plan: Skilled Nursing Facility   Discharge Planning Services: CM Consult Post Acute Care Choice: Skilled Nursing Facility Living arrangements for the past 2 months: Single Family Home                                       Social Determinants of Health (SDOH) Interventions    Readmission Risk Interventions No flowsheet data found.

## 2020-03-16 NOTE — Progress Notes (Signed)
   Providing Compassionate, Quality Care - Together  NEUROSURGERY PROGRESS NOTE   S: No issues overnight. Remains confused  O: EXAM:  BP 103/87 (BP Location: Right Arm)   Pulse 89   Temp 98.2 F (36.8 C) (Oral)   Resp 17   Ht 6\' 3"  (1.905 m)   Wt 133.4 kg   SpO2 99%   BMI 36.76 kg/m   Awake, alert, oriented only to self Speech fluent PERRL Incision well healed CNs grossly intact  Symmetric BUE/BLE strength  ASSESSMENT:  59 y.o. male with  GBM  S/p crani for resection 02/18/2020  PLAN: - SNF pending -lengthy discussion with daughter yesterday re path and treatment plans -pt/ot -scds -pain control     Thank you for allowing me to participate in this patient's care.  Please do not hesitate to call with questions or concerns.   Elwin Sleight, [facility] Cell: [phone]

## 2020-03-16 NOTE — Progress Notes (Signed)
PROGRESS NOTE    Antonio Glenn  CZY:[id number] DOB: 1960-07-10 DOA: 02/11/2020 PCP: Leonard Downing, MD   No chief complaint on file.  Brief Narrative: Antonio Glenn is Antonio Glenn 59 yo male with PMH of obesity; admitted on11/25/2021, with complaint of seizures and confusion, was found to have brain mass s/p craniotomy on 12/1//2021. After extubation patient was referred to Hospitalist for medical management. 11/25 admission for seizures 12/1 craniotomy of the left parietal tumor, remains intubated 12/3 core track insertion 12/4 LTM EEG no seizures but epileptogenicity from left temporal region 12/5 prelim pathology report GBM, sent to [facility] for further evaluation, report not available. 12/9 family meeting and conversation with DNR and no intubation. 12/15 extubated to room air 12/17 PCCM signed off, TRH assuming medical care, low-grade fever, resolved on its own.Daughter converted patient back to FULL CODE. 12/19 right upper extremity Doppler shows SVT, no DVT. 12/27: path back consistent with GBM, grade 4. MBSS performed (patient passed for first time and started on dysphagia 1 diet)  Currently further plan is monitor for improvement in mentation. Awaiting discharge to rehab/CIR.   Assessment & Plan:   Active Problems:   Altered mental status   Seizures (HCC)   Encephalopathy acute   Brain mass   New onset Antonio Glenn-fib (HCC)   Respiratory failure (HCC)   HAP (hospital-acquired pneumonia)   HTN (hypertension)  Acute metabolic encephalopathy- resolved - patient now likely at new baseline (which is rather poor) - Antonio Glenn&Ox1 today - Likely CNS tumor (now GBM by path), polypharmacy - Continue Seroquel and phenobarbital. - 12/19 opioids changed from scheduled to as needed.  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 Poor prognosis and typically average of 2 year  survival - given patient consistently failing swallow evaluations, requiring restraints and mittens to not pull out tubes, and severe debility with right-sided neglect, palliative care consultation warranted at this time to at least begin some discussions on how aggressive family would want to be especially if patient needing PEG tube (started on dysphagia 1 diet on 03/15/2020 - follow for PO intake, ability to tolerate).  Regardless, involving palliative care given underlying GBM is appropriate - Vimpat and phenobarbital for seizures. EEG negative for active seizures.  Acute respiratory failure with hypoxia, POA secondary to encephalopathy. Needed intubation initially. Extubated on 12/15  MRSA HAP - resolved Low-grade fever on 12/17, resolved on its own Prior pneumonia, treated with IV vancomycin. Course completed.  Paroxysmal Antonio Glenn. Fib Currently rate controlled. Anticoagulation on hold due to CNS mass. Monitor.  Thoracic aortic aneurysm Incidental diagnosis. Repeat CT scan in 6 months.  Goals of care conversation Difficult social situation. Currently patient unable to participate in goals of care conversation. Initially based on the discussion with PCCM and the family patient was DNR; now he has been changed back to Full code. See GBM above   Hypokalemia. -Replete and recheck as needed  Dysphagia. -Has been on tube feeds - MBSS on 12/28 (trial of dysphagia 1 diet now) - continue TF until taking in adequate and sufficient diet (need PCM to assist with family discussion if Antonio Glenn PEG becomes needed given that patient has required restraints consistently)  Accelerated hypertension. Blood pressurewas uncontrolled, now improved Started on clonidine,will continue. Continue Norvasc; adjusting dose due to BP still slightly above goal  As needed hydralazine.  Right upper extremity SVT. No evidence of DVT. No need for anticoagulation. Not Antonio Glenn candidate  for anticoagulation  regardless.  Pain control. Now on oxycodone 5-10 q6P; scheduled oxycodone has been discontinued  Low-grade temperature. Intermittent. Likely from SVT vs central fever  X-ray negative  Elevated LFTs Follow   DVT prophylaxis: SCD Code Status: full  Family Communication: none at bedside Disposition:   Status is: Inpatient  Remains inpatient appropriate because:Inpatient level of care appropriate due to severity of illness   Dispo: per primary       Consultants:   Neurosurgery is primary  [facility] consulting   PCCM  Procedures:  12/1 craniotomy  Antimicrobials:  Anti-infectives (From admission, onward)   Start     Dose/Rate Route Frequency Ordered Stop   02/29/20 0200  vancomycin (VANCOREADY) IVPB 1750 mg/350 mL        1,750 mg 175 mL/hr over 120 Minutes Intravenous Every 8 hours 02/28/20 1805 03/02/20 1955   02/25/20 1800  vancomycin (VANCOREADY) IVPB 2000 mg/400 mL  Status:  Discontinued        2,000 mg 200 mL/hr over 120 Minutes Intravenous Every 12 hours 02/25/20 1627 02/28/20 1805   02/17/20 2300  ceFAZolin (ANCEF) IVPB 2g/100 mL premix        2 g 200 mL/hr over 30 Minutes Intravenous Every 8 hours 02/17/20 1918 02/18/20 1507   02/17/20 0500  vancomycin (VANCOREADY) IVPB 1500 mg/300 mL        1,500 mg 150 mL/hr over 120 Minutes Intravenous 120 min pre-op 02/15/20 1325 02/18/20 1128         Subjective: Antonio Glenn&ox1, confused, expressive aphasia  Objective: Vitals:   03/16/20 0405 03/16/20 0500 03/16/20 0830 03/16/20 1139  BP:   103/87 130/87  Pulse:   89 93  Resp:   17 16  Temp: 98.2 F (36.8 C)  98.2 F (36.8 C) 98.2 F (36.8 C)  TempSrc:   Oral Oral  SpO2:   99% 99%  Weight:  133.4 kg    Height:        Intake/Output Summary (Last 24 hours) at 03/16/2020 1437 Last data filed at 03/16/2020 1355 Gross per 24 hour  Intake 338 ml  Output 601 ml  Net -263 ml   Filed Weights   03/14/20 0359 03/15/20 0429 03/16/20 0500  Weight: 133.4 kg 133.6 kg  133.4 kg    Examination:  General exam: Appears calm and comfortable  Respiratory system: Clear to auscultation. Respiratory effort normal. Cardiovascular system: S1 & S2 heard, RRR.  Gastrointestinal system: Abdomen is nondistended, soft and nontender Central nervous system: alert, but disoriented, follows commands, moving all extremities Extremities: no LEE    Data Reviewed: I have personally reviewed following labs and imaging studies  CBC: Recent Labs  Lab 03/12/20 0051 03/13/20 1814 03/14/20 0332 03/15/20 0057 03/16/20 0302  WBC 9.1 9.3 7.3 9.8 10.4  NEUTROABS 5.2 4.9 3.4 5.6 5.7  HGB 13.6 14.5 14.5 13.8 13.6  HCT 41.9 41.3 44.0 39.8 41.6  MCV 90.3 88.1 92.6 88.4 90.8  PLT 525* 569* 378 558* 564*    Basic Metabolic Panel: Recent Labs  Lab 03/11/20 0233 03/12/20 0051 03/13/20 1814 03/14/20 0332 03/15/20 0057 03/16/20 0302  NA 136 137 135 138  --  139  K 3.9 4.0 4.1 4.1  --  3.9  CL 102 104 101 103  --  104  CO2 25 24 24 23   --  25  GLUCOSE 109* 109* 121* 109*  --  108*  BUN 18 15 13 16   --  15  CREATININE 0.66 0.64 0.61 0.58*  --  0.62  CALCIUM 8.6* 8.6* 8.8* 8.8*  --  8.9  MG 2.2 2.2 2.2 2.2 2.1 2.1    GFR: Estimated Creatinine Clearance: 144.6 mL/min (by C-G formula based on SCr of 0.62 mg/dL).  Liver Function Tests: Recent Labs  Lab 03/16/20 0302  AST 28  ALT 70*  ALKPHOS 159*  BILITOT 0.4  PROT 6.1*  ALBUMIN 2.7*    CBG: Recent Labs  Lab 03/15/20 2003 03/15/20 2351 03/16/20 0407 03/16/20 0829 03/16/20 1140  GLUCAP 129* 123* 109* 99 145*     No results found for this or any previous visit (from the past 240 hour(s)).       Radiology Studies: DG Swallowing Func-Speech Pathology  Result Date: 03/15/2020 Objective Swallowing Evaluation: Type of Study: MBS-Modified Barium Swallow Study  Patient Details Name: Antonio Glenn MRN: [id number] Date of Birth: 1960-10-04 Today's Date: 03/15/2020 Time: SLP Start Time (ACUTE ONLY):  1155 -SLP Stop Time (ACUTE ONLY): 1210 SLP Time Calculation (min) (ACUTE ONLY): 15 min Past Medical History: Past Medical History: Diagnosis Date . Brain tumor ([place])   Left parietal brain tumor . PICC (peripherally inserted central catheter) in place 02/14/2020 . Seizure ([place]) 02/14/2020 Past Surgical History: Past Surgical History: Procedure Laterality Date . APPLICATION OF CRANIAL NAVIGATION Left 62/11/5282  Procedure: APPLICATION OF CRANIAL NAVIGATION;  Surgeon: [name], MD;  Location: [facility];  Service: Neurosurgery;  Laterality: Left; . CRANIOTOMY Left 02/17/2020  Procedure: CRANIOTOMY OF  LEFT PARIETAL TUMOR;  Surgeon: [name], MD;  Location: [facility];  Service: Neurosurgery;  Laterality: Left; HPI: Antonio Glenn is [place] 59 y.o. male with hx of recently diagnosed brain tumor (left parietal lobe) who was transferred from [facility] ED to [facility] 11/25 with AMS and seizures. Underwent resection 12/1. Intubated 12/1-12/15. Had FEES 12/21 with recomendation for NPO with occasional tsp honey thick water. MBS today to assess for improvements in swallow function.  No data recorded Assessment / Plan / Recommendation CHL IP CLINICAL IMPRESSIONS 03/15/2020 Clinical Impression Pt's oropharyngeal abilities have improved since FEES 12/21. Oral phase marked by lingual pumping, minimally decreased cohesion and prolonged mastication of solid. Transiently swallow occured at the valleculae and pyriform sinuses where barium collected for 4-6 seconds before swallow. Thin via straw was aspirated from barium in pyriform sinuses with immediate cough pushing barium towards his epiglottis but not completely clearing. Two trials thin via cup did not enter vestibule however, is not recommended due to his poor cognition and aphasia for consistent execution of strategies. Valleculae and pyriform sinuses residue ranged mild-max (max with solid). Kathy Wares puree texture, nectar thick liquids, pills crushed and FULL supervision is  recommended. Cortrak in place until is intake is safe and adequate. SLP Visit Diagnosis Dysphagia, oropharyngeal phase (R13.12);Aphasia (R47.01) Attention and concentration deficit following -- Frontal lobe and executive function deficit following -- Impact on safety and function Mild aspiration risk;Moderate aspiration risk   CHL IP TREATMENT RECOMMENDATION 03/15/2020 Treatment Recommendations Therapy as outlined in treatment plan below   Prognosis 03/15/2020 Prognosis for Safe Diet Advancement Good Barriers to Reach Goals Severity of deficits Barriers/Prognosis Comment -- CHL IP DIET RECOMMENDATION 03/15/2020 SLP Diet Recommendations Dysphagia 1 (Puree) solids;Honey thick liquids Liquid Administration via Cup Medication Administration Crushed with puree Compensations Slow rate;Small sips/bites;Multiple dry swallows after each bite/sip Postural Changes Seated upright at 90 degrees   CHL IP OTHER RECOMMENDATIONS 03/15/2020 Recommended Consults -- Oral Care Recommendations Oral care BID Other Recommendations --   CHL IP FOLLOW UP RECOMMENDATIONS 03/15/2020 Follow  up Recommendations Skilled Nursing facility   [facility] IP FREQUENCY AND DURATION 03/15/2020 Speech Therapy Frequency (ACUTE ONLY) min 2x/week Treatment Duration 2 weeks      CHL IP ORAL PHASE 03/15/2020 Oral Phase Impaired Oral - Pudding Teaspoon -- Oral - Pudding Cup -- Oral - Honey Teaspoon -- Oral - Honey Cup Lingual pumping;Delayed oral transit Oral - Nectar Teaspoon -- Oral - Nectar Cup Decreased bolus cohesion Oral - Nectar Straw WFL Oral - Thin Teaspoon -- Oral - Thin Cup WFL Oral - Thin Straw -- Oral - Puree Lingual pumping Oral - Mech Soft -- Oral - Regular (No Data) Oral - Multi-Consistency -- Oral - Pill -- Oral Phase - Comment --  CHL IP PHARYNGEAL PHASE 03/15/2020 Pharyngeal Phase -- Pharyngeal- Pudding Teaspoon -- Pharyngeal -- Pharyngeal- Pudding Cup -- Pharyngeal -- Pharyngeal- Honey Teaspoon NT Pharyngeal -- Pharyngeal- Honey Cup Delayed swallow  initiation-vallecula;Delayed swallow initiation-pyriform sinuses;Pharyngeal residue - valleculae Pharyngeal Material does not enter airway Pharyngeal- Nectar Teaspoon NT Pharyngeal -- Pharyngeal- Nectar Cup Delayed swallow initiation-vallecula Pharyngeal Material does not enter airway Pharyngeal- Nectar Straw Delayed swallow initiation-vallecula Pharyngeal Material does not enter airway Pharyngeal- Thin Teaspoon -- Pharyngeal -- Pharyngeal- Thin Cup WFL Pharyngeal -- Pharyngeal- Thin Straw Penetration/Aspiration during swallow;Delayed swallow initiation-pyriform sinuses;Pharyngeal residue - valleculae;Pharyngeal residue - pyriform Pharyngeal Material enters airway, passes BELOW cords and not ejected out despite cough attempt by patient Pharyngeal- Puree Pharyngeal residue - valleculae;Delayed swallow initiation-vallecula Pharyngeal Material does not enter airway Pharyngeal- Mechanical Soft NT Pharyngeal -- Pharyngeal- Regular Pharyngeal residue - valleculae Pharyngeal -- Pharyngeal- Multi-consistency NT Pharyngeal -- Pharyngeal- Pill NT Pharyngeal -- Pharyngeal Comment --  CHL IP CERVICAL ESOPHAGEAL PHASE 03/15/2020 Cervical Esophageal Phase WFL Pudding Teaspoon -- Pudding Cup -- Honey Teaspoon -- Honey Cup -- Nectar Teaspoon -- Nectar Cup -- Nectar Straw -- Thin Teaspoon -- Thin Cup -- Thin Straw -- Puree -- Mechanical Soft -- Regular -- Multi-consistency -- Pill -- Cervical Esophageal Comment -- Antonio Glenn 03/15/2020, 1:08 PM  Antonio Glenn M.Ed Actor Pager [phone] Office [phone]                  Scheduled Meds: . amLODipine  10 mg Per Tube Daily  . bethanechol  10 mg Per Tube TID  . Chlorhexidine Gluconate Cloth  6 each Topical Daily  . cloNIDine  0.1 mg Per Tube Daily  . docusate  100 mg Per Tube BID  . enoxaparin (LOVENOX) injection  40 mg Subcutaneous Q24H  . feeding supplement (PROSource TF)  90 mL Per Tube TID  . insulin aspart  0-15 Units  Subcutaneous Q4H  . lacosamide  100 mg Per Tube BID  . pantoprazole sodium  40 mg Per Tube Daily  . PHENObarbital  90 mg Per Tube BID  . QUEtiapine  200 mg Per Tube BID   Continuous Infusions: . sodium chloride Stopped (02/26/20 2221)  . sodium chloride Stopped (03/02/20 1734)  . feeding supplement (OSMOLITE 1.5 CAL) 1,000 mL (03/14/20 0945)     LOS: 34 days    Time spent: over 37 min    Fayrene Helper, MD [facility]   To contact the attending provider between 7A-7P or the covering provider during after hours 7P-7A, please log into the web site [email or web address] and access using universal [place] password for that web site. If you do not have the password, please call the hospital operator.  03/16/2020, 2:37 PM

## 2020-03-16 NOTE — Progress Notes (Signed)
Nutrition Follow-up  DOCUMENTATION CODES:   Obesity unspecified  INTERVENTION:  Provide Ensure Enlive po BID (thickened to nectar thick consistency), each supplement provides 350 kcal and 20 grams of protein.  Encourage adequate PO intake.   Transition to nocturnal tube feeds: Osmolite 1.5 cal formula via Cortrak NGT at goal rate of 80 ml/hr x 12 hours (7pm-7am).  Continue 90 ml Prosource TF TID per tube.   Nocturnal tube feeds to provide 1680 kcal (75% of kcal needs), 126 grams of protein (100% of protein needs), 730 ml water.   NUTRITION DIAGNOSIS:   Inadequate oral intake related to lethargy/confusion as evidenced by NPO status; ongoing  GOAL:   Patient will meet greater than or equal to 90% of their needs; met with TF  MONITOR:   Diet advancement,Labs,Weight trends,TF tolerance,I & O's  REASON FOR ASSESSMENT:   Consult Enteral/tube feeding initiation and management  ASSESSMENT:   59 year old male who presented on 11/25 with seizure-like activity. Pt was recently found to have brain mass concerning for neoplasm with plan for left craniotomy on 12/01. Per MD, pt with left parietal tumor with surrounding cerebral edema. Tumor pathology returned with GBM IDH-wildtype Grade 4 with poor prognosis.   11/29 - Cortrak placed, later pt pulled tube out 12/01 - s/p crani of L parietal tumor  12/03 - Cortrak placed, tip gastric 12/13 - pt vomited, Cortrak repositioned to post-pyloric (4th portion of duodenum) 12/15 - extubated 12/20 - diet advanced to dysphagia 1 with nectar-thick liquids 12/21 - FEES with recommendations for NPO with honey-thick water only 12/28 - Diet advanced   Pt underwent MBS yesterday. Diet advanced to a dysphagia 1 diet with nectar thick liquids yesterday. Meal completion has been 50-75%. Per MD, plans to continue tube feeding until po adequate. RD to transition to nocturnal tube feeds to encouraged PO intake during the day. Will additionally order  nutritional supplements to aid in PO.   Labs and medications reviewed.   Diet Order:   Diet Order            DIET - DYS 1 Room service appropriate? No; Fluid consistency: Nectar Thick  Diet effective now                 EDUCATION NEEDS:   No education needs have been identified at this time  Skin:  Skin Assessment: Reviewed RN Assessment Skin Integrity Issues:: Incisions Incisions: head  Last BM:  12/28  Height:   Ht Readings from Last 1 Encounters:  02/17/20 6' 3"  (1.905 m)    Weight:   Wt Readings from Last 1 Encounters:  03/16/20 133.4 kg   BMI:  Body mass index is 36.76 kg/m.  Estimated Nutritional Needs:   Kcal:  6568-1275  Protein:  120-145 grams  Fluid:  >/= 2.0 L  Corrin Parker, MS, RD, LDN RD pager number/after hours weekend pager number on Amion.

## 2020-03-16 NOTE — Progress Notes (Signed)
Palliative:  Briefly saw patient. No family at bedside. Spoke with RN - patient more calm today, ate about 75% of breakfast. Discussed that daughter is Management consultant as ex wife has not provided HCPOA paperwork. Plans for Dr. Barbaraann Cao to speak with daughter tomorrow noted. Epic chat with Dr. Lowell Guitar and Dr. Barbaraann Cao - will plan to follow up with family after Dr. Barbaraann Cao is able to speak with daughter.   Gerlean Ren, DNP, AGNP-C Palliative Medicine Team Team Phone # [phone]  Pager # [phone]  NO CHARGE

## 2020-03-17 DIAGNOSIS — G9389 Other specified disorders of brain: Secondary | ICD-10-CM | POA: Diagnosis not present

## 2020-03-17 DIAGNOSIS — C713 Malignant neoplasm of parietal lobe: Principal | ICD-10-CM

## 2020-03-17 DIAGNOSIS — R569 Unspecified convulsions: Secondary | ICD-10-CM | POA: Diagnosis not present

## 2020-03-17 DIAGNOSIS — Z515 Encounter for palliative care: Secondary | ICD-10-CM

## 2020-03-17 DIAGNOSIS — Z7189 Other specified counseling: Secondary | ICD-10-CM | POA: Diagnosis not present

## 2020-03-17 LAB — CBC WITH DIFFERENTIAL/PLATELET
Abs Immature Granulocytes: 0.56 10*3/uL — ABNORMAL HIGH (ref 0.00–0.07)
Basophils Absolute: 0.2 10*3/uL — ABNORMAL HIGH (ref 0.0–0.1)
Basophils Relative: 2 %
Eosinophils Absolute: 0.9 10*3/uL — ABNORMAL HIGH (ref 0.0–0.5)
Eosinophils Relative: 9 %
HCT: 39.8 % (ref 39.0–52.0)
Hemoglobin: 13.6 g/dL (ref 13.0–17.0)
Immature Granulocytes: 5 %
Lymphocytes Relative: 17 %
Lymphs Abs: 1.7 10*3/uL (ref 0.7–4.0)
MCH: 30.7 pg (ref 26.0–34.0)
MCHC: 34.2 g/dL (ref 30.0–36.0)
MCV: 89.8 fL (ref 80.0–100.0)
Monocytes Absolute: 1.1 10*3/uL — ABNORMAL HIGH (ref 0.1–1.0)
Monocytes Relative: 11 %
Neutro Abs: 5.8 10*3/uL (ref 1.7–7.7)
Neutrophils Relative %: 56 %
Platelets: 554 10*3/uL — ABNORMAL HIGH (ref 150–400)
RBC: 4.43 MIL/uL (ref 4.22–5.81)
RDW: 13.7 % (ref 11.5–15.5)
WBC: 10.4 10*3/uL (ref 4.0–10.5)
nRBC: 0 % (ref 0.0–0.2)

## 2020-03-17 LAB — COMPREHENSIVE METABOLIC PANEL
ALT: 57 U/L — ABNORMAL HIGH (ref 0–44)
AST: 22 U/L (ref 15–41)
Albumin: 2.7 g/dL — ABNORMAL LOW (ref 3.5–5.0)
Alkaline Phosphatase: 151 U/L — ABNORMAL HIGH (ref 38–126)
Anion gap: 9 (ref 5–15)
BUN: 15 mg/dL (ref 6–20)
CO2: 24 mmol/L (ref 22–32)
Calcium: 8.8 mg/dL — ABNORMAL LOW (ref 8.9–10.3)
Chloride: 106 mmol/L (ref 98–111)
Creatinine, Ser: 0.58 mg/dL — ABNORMAL LOW (ref 0.61–1.24)
GFR, Estimated: >60 mL/min (ref >60)
Glucose, Bld: 121 mg/dL — ABNORMAL HIGH (ref 70–99)
Potassium: 4 mmol/L (ref 3.5–5.1)
Sodium: 139 mmol/L (ref 135–145)
Total Bilirubin: 0.3 mg/dL (ref 0.3–1.2)
Total Protein: 6.1 g/dL — ABNORMAL LOW (ref 6.5–8.1)

## 2020-03-17 LAB — GLUCOSE, CAPILLARY
Glucose-Capillary: 112 mg/dL — ABNORMAL HIGH (ref 70–99)
Glucose-Capillary: 124 mg/dL — ABNORMAL HIGH (ref 70–99)
Glucose-Capillary: 126 mg/dL — ABNORMAL HIGH (ref 70–99)
Glucose-Capillary: 93 mg/dL (ref 70–99)
Glucose-Capillary: 108 mg/dL — ABNORMAL HIGH (ref 70–99)

## 2020-03-17 LAB — MAGNESIUM: Magnesium: 2.1 mg/dL (ref 1.7–2.4)

## 2020-03-17 LAB — PHOSPHORUS: Phosphorus: 3.9 mg/dL (ref 2.5–4.6)

## 2020-03-17 NOTE — Progress Notes (Signed)
   Providing Compassionate, Quality Care - Together  NEUROSURGERY PROGRESS NOTE   S: No issues overnight.   O: EXAM:  BP 139/88 (BP Location: Right Arm)   Pulse 98   Temp 97.9 F (36.6 C) (Oral)   Resp 19   Ht 6\' 3"  (1.905 m)   Wt 133.8 kg   SpO2 99%   BMI 36.87 kg/m   Awake, alert, oriented only to self Speech fluent PERRL Incision well healed CNs grossly intact  Symmetric BUE/BLE strength  ASSESSMENT:  59 y.o. male with  GBM  S/p crani for resection 02/18/2020  PLAN: - SNF pending, bed avail, still needing to progress with diet intake before dc/limit restraints -pt/ot -scds -pain control    Thank you for allowing me to participate in this patient's care.  Please do not hesitate to call with questions or concerns.   Elwin Sleight, [facility] Cell: [phone]

## 2020-03-17 NOTE — Progress Notes (Signed)
Patient continues to be confused and impulsive with memory impairment.  When restraints are off for care and ROM, patient attempts to pull at feeding tube multiple times even after repeated instructions.  Restraints remain medically necessary.  New order obtained this shift.  Will continue to monitor.

## 2020-03-17 NOTE — Plan of Care (Signed)

## 2020-03-17 NOTE — Progress Notes (Signed)
  Speech Language Pathology Treatment: Dysphagia;Cognitive-Linquistic  Patient Details Name: Antonio Glenn MRN: [id number] DOB: [birth year] Today's Date: 03/17/2020 Time: 5027-7412 SLP Time Calculation (min) (ACUTE ONLY): 19 min  Assessment / Plan / Recommendation Clinical Impression  Intervention focused on skilled po observation and language activities. Naming of common objects pt required max cueing with phrase completion, phonemic cues and repetition from therapist. He was successful in repeating words 75% of the time. Independent stating his and daughter's name. One step commands followed 3/3 with additional processing time. Basic yes/no responses to person/environment are not reliable and accuracy is inconsistent. Mostly fluent with perseverations, paraphasias decreasing meaning of utterances. He can make needs known at times with target words in context.  Swallowing standpoint, he consumed nectar thick liquids, pudding, self fed with moderate tactile assist. Delayed throat clears present inconsistently. Tech reported he ate well this morning but indicated he did not like the taste. Continue puree, nectar thick liquids with FULL supervision due to impulsivity.    HPI HPI: Antonio Glenn is a 59 y.o. male with hx of recently diagnosed brain tumor (left parietal lobe) who was transferred from [facility] ED to [facility] 11/25 with AMS and seizures. Underwent resection 12/1. Intubated 12/1-12/15. Had FEES 12/21 with recomendation for NPO with occasional tsp honey thick water. MBS 12/28 revealed improvements with recs for dysphagia 1, nectar thick liquids.      SLP Plan  Continue with current plan of care       Recommendations  Diet recommendations: Dysphagia 1 (puree);Nectar-thick liquid Liquids provided via: Cup Medication Administration: Crushed with puree Supervision: Staff to assist with self feeding;Full supervision/cueing for compensatory strategies Compensations: Minimize environmental  distractions;Slow rate;Small sips/bites;Lingual sweep for clearance of pocketing Postural Changes and/or Swallow Maneuvers: Seated upright 90 degrees                Oral Care Recommendations: Oral care BID Follow up Recommendations: Skilled Nursing facility SLP Visit Diagnosis: Dysphagia, oropharyngeal phase (R13.12);Aphasia (R47.01);Cognitive communication deficit (I78.676) Plan: Continue with current plan of care                       Antonio Glenn 03/17/2020, 11:16 AM  Antonio Glenn.Ed Nurse, children's [phone] Office [phone]

## 2020-03-17 NOTE — Consult Note (Signed)
[facility] Consult Note  Patient Care Team: Leonard Downing, MD as PCP - General (Family Medicine)  CHIEF COMPLAINTS/PURPOSE OF CONSULTATION:  Left parietal Mass  HISTORY OF PRESENTING ILLNESS:  Antonio Glenn 59 y.o. male presented with seizures, right sided weakness after being found in confused state at home by neighbors on 11/25.  Workup demonstrated a left temporal/parietal mass consistent with likely primary brain tumor.  He underwent resection on 12/1 with Dr. Marcello Moores.  Post-operative course has been complicated by severe encephalopathy, seizures, motor dysfunction.  We are bedside today to introduce brain tumor program and briefly review pathology which resulted earlier this week.  MEDICAL HISTORY:  Past Medical History:  Diagnosis Date  . Brain tumor ([place])    Left parietal brain tumor  . PICC (peripherally inserted central catheter) in place 02/14/2020  . Seizure ([place]) 02/14/2020    SURGICAL HISTORY: Past Surgical History:  Procedure Laterality Date  . APPLICATION OF CRANIAL NAVIGATION Left 02/17/2020   Procedure: APPLICATION OF CRANIAL NAVIGATION;  Surgeon: [name], MD;  Location: [facility];  Service: Neurosurgery;  Laterality: Left;  . CRANIOTOMY Left 02/17/2020   Procedure: CRANIOTOMY OF  LEFT PARIETAL TUMOR;  Surgeon: [name], MD;  Location: [facility];  Service: Neurosurgery;  Laterality: Left;    SOCIAL HISTORY: Social History   Socioeconomic History  . Marital status: Married    Spouse name: Not on file  . Number of children: Not on file  . Years of education: Not on file  . Highest education level: Not on file  Occupational History  . Not on file  Tobacco Use  . Smoking status: Not on file  . Smokeless tobacco: Not on file  Substance and Sexual Activity  . Alcohol use: Not on file  . Drug use: Not on file  . Sexual activity: Not on file  Other Topics Concern  . Not on file  Social History Narrative  .  Not on file   Social Determinants of Health   Financial Resource Strain: Not on file  Food Insecurity: Not on file  Transportation Needs: Not on file  Physical Activity: Not on file  Stress: Not on file  Social Connections: Not on file  Intimate Partner Violence: Not on file    FAMILY HISTORY: History reviewed. No pertinent family history.  ALLERGIES:  has No Known Allergies.  MEDICATIONS:  Current Facility-Administered Medications  Medication Dose Route Frequency Provider Last Rate Last Admin  . 0.9 %  sodium chloride infusion   Intravenous PRN Cristal Generous, NP   Stopped at 02/26/20 2221  . 0.9 %  sodium chloride infusion  250 mL Intravenous Continuous Stretch, Marily Lente, MD   Stopped at 03/02/20 1734  . acetaminophen (TYLENOL) tablet 650 mg  650 mg Per Tube Q4H PRN Kipp Brood, MD   650 mg at 03/05/20 2109   Or  . acetaminophen (TYLENOL) suppository 650 mg  650 mg Rectal Q4H PRN Kipp Brood, MD      . amLODipine (NORVASC) tablet 10 mg  10 mg Per Tube Daily Dwyane Dee, MD   10 mg at 03/17/20 0831  . bethanechol (URECHOLINE) tablet 10 mg  10 mg Per Tube TID Olalere, Adewale A, MD   10 mg at 03/17/20 0831  . bisacodyl (DULCOLAX) suppository 10 mg  10 mg Rectal Daily PRN Merlene Laughter F, NP   10 mg at 02/23/20 1414  . Chlorhexidine Gluconate Cloth 2 % PADS 6 each  6  each Topical Daily Kipp Brood, MD   6 each at 03/17/20 [phone]  . cloNIDine (CATAPRES) tablet 0.1 mg  0.1 mg Per Tube Daily Lavina Hamman, MD   0.1 mg at 03/17/20 0830  . diazepam (VALIUM) tablet 5 mg  5 mg Per Tube Q12H PRN Olalere, Adewale A, MD   5 mg at 03/16/20 2036  . docusate (COLACE) 50 MG/5ML liquid 100 mg  100 mg Per Tube BID Kipp Brood, MD   100 mg at 03/17/20 0830  . enoxaparin (LOVENOX) injection 40 mg  40 mg Subcutaneous Q24H [name], MD   40 mg at 03/17/20 0831  . feeding supplement (ENSURE ENLIVE / ENSURE PLUS) liquid 237 mL  237 mL Oral BID BM [name], MD   237 mL  at 03/17/20 0830  . feeding supplement (OSMOLITE 1.5 CAL) liquid 960 mL  960 mL Per Tube Q24H [name], MD   Stopped at 03/17/20 [phone]  . feeding supplement (PROSource TF) liquid 90 mL  90 mL Per Tube TID Brand Males, MD   90 mL at 03/17/20 0831  . hydrALAZINE (APRESOLINE) injection 10 mg  10 mg Intravenous Q6H PRN Lavina Hamman, MD      . insulin aspart (novoLOG) injection 0-15 Units  0-15 Units Subcutaneous Q4H [name], MD   2 Units at 03/17/20 [phone]  . lacosamide (VIMPAT) tablet 100 mg  100 mg Per Tube BID [name], MD   100 mg at 03/17/20 0831  . ondansetron (ZOFRAN) tablet 4 mg  4 mg Per Tube Q4H PRN Kipp Brood, MD       Or  . ondansetron (ZOFRAN) injection 4 mg  4 mg Intravenous Q4H PRN Kipp Brood, MD   4 mg at 02/29/20 0015  . oxyCODONE (Oxy IR/ROXICODONE) immediate release tablet 5-10 mg  5-10 mg Per Tube Q6H PRN Lavina Hamman, MD   10 mg at 03/13/20 0201  . pantoprazole sodium (PROTONIX) 40 mg/20 mL oral suspension 40 mg  40 mg Per Tube Daily Kipp Brood, MD   40 mg at 03/17/20 0829  . PHENObarbital 20 MG/5ML elixir 90 mg  90 mg Per Tube BID Lora Havens, MD   90 mg at 03/17/20 0831  . polyethylene glycol (MIRALAX / GLYCOLAX) packet 17 g  17 g Per Tube Daily PRN [name], MD   17 g at 02/29/20 1253  . promethazine (PHENERGAN) tablet 12.5-25 mg  12.5-25 mg Per Tube Q4H PRN Kipp Brood, MD      . QUEtiapine (SEROQUEL) tablet 200 mg  200 mg Per Tube BID Rigoberto Noel, MD   200 mg at 03/17/20 0830  . Resource ThickenUp Clear   Oral PRN [name], MD        REVIEW OF SYSTEMS:   Limited by dyshpasia  PHYSICAL EXAMINATION: Vitals:   03/17/20 0312 03/17/20 0746  BP: (!) 148/102 (!) 155/89  Pulse: (!) 103 94  Resp: 20 17  Temp: 98.5 F (36.9 C) 97.6 F (36.4 C)  SpO2: 95% 97%   KPS: 50. General: Alert,pleasant, in no acute distress Head: Craniotomy scar noted, dry and intact. EENT: NG tube in  place Lungs: Resp effort normal Cardiac: Regular rate and rhythm Abdomen: Soft, non-distended abdomen Skin: No rashes cyanosis or petechiae. Extremities: No clubbing or edema  NEUROLOGIC EXAM: Mental Status: Awake and alert. Oriented to self and environment with help and direction.  Poor insight into nature of his clinical  situation. Language is impaired with regards to comprehension, including paraphasic errors. Cranial Nerves: Visual acuity is grossly normal. Visual fields are full. Extra-ocular movements intact. No ptosis. Face is symmetric, tongue midline. Motor: Arms and legs antigravity, but fairly dense motor neglect of right side is appreciated. Sensory: Intact to light touch and temperature Gait: Deferred   LABORATORY DATA:  I have reviewed the data as listed Lab Results  Component Value Date   WBC 10.4 03/17/2020   HGB 13.6 03/17/2020   HCT 39.8 03/17/2020   MCV 89.8 03/17/2020   PLT 554 (H) 03/17/2020   Recent Labs    03/09/20 0146 03/10/20 0218 03/14/20 0332 03/16/20 0302 03/17/20 0318  NA 138   < > 138 139 139  K 4.0   < > 4.1 3.9 4.0  CL 103   < > 103 104 106  CO2 25   < > 23 25 24   GLUCOSE 114*   < > 109* 108* 121*  BUN 20   < > 16 15 15   CREATININE 0.71   < > 0.58* 0.62 0.58*  CALCIUM 8.8*   < > 8.8* 8.9 8.8*  GFRNONAA >60   < > >60 >60 >60  PROT 6.3*  --   --  6.1* 6.1*  ALBUMIN 2.6*  --   --  2.7* 2.7*  AST 45*  --   --  28 22  ALT 121*  --   --  70* 57*  ALKPHOS 156*  --   --  159* 151*  BILITOT 0.9  --   --  0.4 0.3   < > = values in this interval not displayed.    RADIOGRAPHIC STUDIES: I have personally reviewed the radiological images as listed and agreed with the findings in the report. DG Abd 1 View  Result Date: 02/29/2020 CLINICAL DATA:  Feeding tube placement EXAM: ABDOMEN - 1 VIEW COMPARISON:  February 17, 2020 FINDINGS: Feeding tube tip at level of 4 portion of duodenum. No bowel dilatation or air-fluid level to suggest bowel  obstruction. No free air. Visualized left lung base clear. IMPRESSION: Feeding tube tip at level of 4 portion of duodenum. No bowel obstruction or free air evident on supine examination. Electronically Signed   By: Lowella Grip III M.D.   On: 02/29/2020 12:37   CT CHEST W CONTRAST  Result Date: 02/20/2020 CLINICAL DATA:  Assess for mediastinal mass. Known brain malignancy post resection. EXAM: CT CHEST WITH CONTRAST TECHNIQUE: Multidetector CT imaging of the chest was performed during intravenous contrast administration. CONTRAST:  75mL OMNIPAQUE IOHEXOL 300 MG/ML  SOLN COMPARISON:  Radiograph 02/20/2020 FINDINGS: Cardiovascular: Cardiomegaly with predominantly left heart enlargement. No pericardial effusion. Marked dilatation of the ascending thoracic aorta to 4.7 cm remaining dilated at the aortic arch up to 3.6 cm returning to a more normal caliber of 3 cm by the diaphragmatic hiatus. No acute luminal abnormality is evident. No periaortic stranding or hemorrhage. Normal 3 vessel branching of the aortic arch with mild tortuosity of brachiocephalic vasculature but otherwise unremarkable appearance of the proximal great vessels. Central pulmonary arteries are normal caliber. No large central filling defects. Left upper extremity PICC tip terminates in the lower SVC. No other major venous abnormalities are seen. Mediastinum/Nodes: No focal mediastinal mass mediastinal fluid or gas. Normal thyroid gland and thoracic inlet. Endotracheal intubation with the tip of endotracheal to 4.8 cm from the carina. Transesophageal tube is in place as well terminating below the margins of imaging, with tip and side  port beyond the GE junction. No acute abnormality of the trachea or esophagus. No worrisome mediastinal, hilar or axillary adenopathy. Lungs/Pleura: Bilateral pleural effusions with some adjacent areas passive atelectatic change. Additional areas of subsegmental atelectatic collapse are seen in the posterior  segment right upper lobe and posterior segment right lower lobe. No pneumothorax. No concerning pulmonary nodules or masses are seen. Upper Abdomen: No acute abnormalities present in the visualized portions of the upper abdomen. Transesophageal tube in place. Musculoskeletal: Cervical spondylitic changes. Degenerative changes in the shoulders and thoracic spine are present as well. No acute or worrisome osseous lesions. No suspicious chest wall lesions. IMPRESSION: 1. Marked dilatation of the ascending thoracic aorta to 4.7 cm remaining dilated at the distal aortic arch up to 3.6 cm returning to a more normal caliber of 3 cm by the diaphragmatic hiatus. No acute luminal abnormality is evident within the limitations of this non angiographic exam. Ascending thoracic aortic aneurysm. Recommend semi-annual imaging followup by CTA or MRA and referral to cardiothoracic surgery if not already obtained. This recommendation follows 2010 ACCF/AHA/AATS/ACR/ASA/SCA/SCAI/SIR/STS/SVM Guidelines for the Diagnosis and Management of Patients With Thoracic Aortic Disease. Circulation. 2010; [id number]:[id number]. Aortic aneurysm NOS (ICD10-I71.9) 2. Cardiomegaly with predominantly left heart enlargement. 3. No discrete mediastinal mass or adenopathy. Contour abnormality in widening likely related to the aneurysm and cardiomegaly above. 4. Bilateral pleural effusions with some adjacent areas of passive atelectatic change. Additional areas of subsegmental atelectatic collapse are seen in the posterior segment right upper lobe and posterior segment right lower lobe. Underlying airspace disease is difficult to exclude fully. 5. Endotracheal intubation with the tip of endotracheal to 4.8 cm from the carina. 6. Transesophageal tube in place with tip and side port beyond the margins of imaging. Electronically Signed   By: Lovena Le M.D.   On: 02/20/2020 18:06   MR BRAIN W WO CONTRAST  Result Date: 02/18/2020 CLINICAL DATA:  Brain mass post  resection EXAM: MRI HEAD WITHOUT AND WITH CONTRAST TECHNIQUE: Multiplanar, multiecho pulse sequences of the brain and surrounding structures were obtained without and with intravenous contrast. CONTRAST:  29mL GADAVIST GADOBUTROL 1 MMOL/ML IV SOLN COMPARISON:  01/30/2020 FINDINGS: Brain: There are new postoperative changes of left parietotemporal mass resection with a resection cavity containing fluid, blood products, and minimal air. There is mild surrounding diffusion restriction likely reflecting postoperative contusion. Additional pneumocephalus is present primarily along the left frontal convexity. Contrast enhanced imaging is degraded by motion artifact. There is minimal enhancement at the resection cavity margins. Extent of T2 FLAIR hyperintensity remains unchanged. There is persistent partial effacement of the posterior left lateral ventricle. There is additional mild diffusion and T2 hyperintensity without ADC hypointensity along the left hippocampus and posteromedial left thalamus. Vascular: Major vessel flow voids at the skull base are preserved. Skull and upper cervical spine: Left parietal craniotomy. Normal marrow signal is preserved. Sinuses/Orbits: Paranasal sinuses are aerated. Orbits are unremarkable. Other: Sella is unremarkable.  Mastoid air cells are clear. IMPRESSION: Expected postoperative changes post gross total resection of enhancing component of left parietotemporal mass. Minimal enhancement at the resection cavity margins is probably postoperative but small volume residual tumor is difficult to exclude due to irregular appearance of the preoperative mass. T2 hyperintensity along the left hippocampus and posteromedial left thalamus. May be related to recent seizure activity, postsurgical, or rapid progression of infiltrating tumor. Electronically Signed   By: Macy Mis M.D.   On: 02/18/2020 11:56   DG CHEST PORT 1 VIEW  Result Date: 03/06/2020  CLINICAL DATA:  Fever. EXAM:  PORTABLE CHEST 1 VIEW COMPARISON:  February 29, 2020 FINDINGS: The ET tube is been removed. The feeding tube terminates below today's film. The cardiomediastinal silhouette is stable. No pneumothorax. Mild opacity in the medial right lung base. No other abnormalities. IMPRESSION: Mild opacity in the medial right lung base could represent atelectasis, vascular crowding, or developing infiltrate. No other acute abnormalities. Electronically Signed   By: Dorise Bullion III M.D   On: 03/06/2020 12:52   DG Chest Port 1 View  Result Date: 02/29/2020 CLINICAL DATA:  Acute respiratory failure EXAM: PORTABLE CHEST 1 VIEW COMPARISON:  02/25/2020 FINDINGS: Endotracheal tube tip is 6.5 cm above the base of the carina. Feeding tube tip appears to be in the distal esophagus. Low lung volumes with bibasilar atelectasis. No pleural effusion. The visualized bony structures of the thorax show no acute abnormality. Telemetry leads overlie the chest. IMPRESSION: 1. Low volume film with streaky basilar opacity suggesting atelectasis. 2. Feeding tube tip appears to be in the distal esophagus. These results will be called to the ordering clinician or representative by the Radiologist Assistant, and communication documented in the PACS or [employer]. Electronically Signed   By: Misty Stanley M.D.   On: 02/29/2020 07:59   DG Chest Port 1 View  Result Date: 02/25/2020 CLINICAL DATA:  Pneumonia. EXAM: PORTABLE CHEST 1 VIEW COMPARISON:  Radiographs February 22, 2020. CT chest December 4, 21. FINDINGS: Endotracheal tube tip is approximately 3.2 cm above the carina. Enteric tube courses below the diaphragm in outside the field of view. Low lung volumes with left basilar opacities. No visible pleural effusions or pneumothorax. Similar widening of the mediastinal contour with ascending aortic aneurysm better characterized on recent CT chest. Similar cardiac silhouette. No acute osseous abnormality. IMPRESSION: 1. Low lung volumes  with left basilar opacities, which could represent atelectasis, aspiration, and/or pneumonia. 2. Similar widening of the mediastinal contour with ascending aortic aneurysm better characterized on recent CT chest. Electronically Signed   By: Margaretha Sheffield MD   On: 02/25/2020 13:56   DG CHEST PORT 1 VIEW  Result Date: 02/22/2020 CLINICAL DATA:  Respiratory failure. EXAM: PORTABLE CHEST 1 VIEW COMPARISON:  Chest x-ray 02/20/2020 FINDINGS: The endotracheal tube is 3.5 cm above the carina. The feeding tube is coursing down the esophagus and into the stomach. The left PICC line is stable. Streaky basilar atelectasis but no infiltrates, edema or large effusion. No pneumothorax. The bony thorax is intact. IMPRESSION: 1. Stable support apparatus. 2. Streaky basilar atelectasis. Electronically Signed   By: Marijo Sanes M.D.   On: 02/22/2020 07:14   DG CHEST PORT 1 VIEW  Result Date: 02/20/2020 CLINICAL DATA:  Check endotracheal tube placement EXAM: PORTABLE CHEST 1 VIEW COMPARISON:  02/18/2020 FINDINGS: Endotracheal tube, feeding catheter and left-sided PICC line are again noted and stable. Overall inspiratory effort is poor. No focal infiltrate is seen. Cardiac shadow is stable. Prominence of the mediastinum to the right of the midline is seen and stable although likely related to patient rotation. IMPRESSION: Stable appearance of the chest when compared with the prior exam. Electronically Signed   By: Inez Catalina M.D.   On: 02/20/2020 09:23   DG CHEST PORT 1 VIEW  Result Date: 02/18/2020 CLINICAL DATA:  Endotracheal tube. EXAM: PORTABLE CHEST 1 VIEW COMPARISON:  February 12, 2020. FINDINGS: Endotracheal and nasogastric tubes are in good position. Left-sided PICC line is noted with tip in expected position of the SVC. No pneumothorax or pleural  effusion is noted. Minimal bibasilar subsegmental atelectasis is noted. There is increased prominence involving the right paramediastinal and perihilar regions since  prior exam. Bony thorax is unremarkable. IMPRESSION: Endotracheal and nasogastric tubes in good position. Increased prominence involving right paramediastinal and perihilar regions since prior exam. CT scan of the chest with Intravenous contrast is recommended for further evaluation. These results will be called to the ordering clinician or representative by the Radiologist Assistant, and communication documented in the PACS or [employer]. Electronically Signed   By: Marijo Conception M.D.   On: 02/18/2020 09:48   DG Abd Portable 1V  Result Date: 02/17/2020 CLINICAL DATA:  Check gastric catheter placement EXAM: PORTABLE ABDOMEN - 1 VIEW COMPARISON:  None. FINDINGS: Gastric catheter is noted within the distal stomach. Scattered large and small bowel gas is noted. No free air is seen. IMPRESSION: Gastric catheter within the stomach. Electronically Signed   By: Inez Catalina M.D.   On: 02/17/2020 22:27   DG Swallowing Func-Speech Pathology  Result Date: 03/15/2020 Objective Swallowing Evaluation: Type of Study: MBS-Modified Barium Swallow Study  Patient Details Name: Antonio Glenn MRN: [id number] Date of Birth: 14-Mar-1961 Today's Date: 03/15/2020 Time: SLP Start Time (ACUTE ONLY): 1155 -SLP Stop Time (ACUTE ONLY): 1210 SLP Time Calculation (min) (ACUTE ONLY): 15 min Past Medical History: Past Medical History: Diagnosis Date . Brain tumor ([place])   Left parietal brain tumor . PICC (peripherally inserted central catheter) in place 02/14/2020 . Seizure ([place]) 02/14/2020 Past Surgical History: Past Surgical History: Procedure Laterality Date . APPLICATION OF CRANIAL NAVIGATION Left [id number]  Procedure: APPLICATION OF CRANIAL NAVIGATION;  Surgeon: [name], MD;  Location: [facility];  Service: Neurosurgery;  Laterality: Left; . CRANIOTOMY Left 02/17/2020  Procedure: CRANIOTOMY OF  LEFT PARIETAL TUMOR;  Surgeon: [name], MD;  Location: [facility];  Service: Neurosurgery;  Laterality: Left; HPI: Antonio Glenn is a 58 y.o. male with hx of recently diagnosed brain tumor (left parietal lobe) who was transferred from [place] ED to [facility] 11/25 with AMS and seizures. Underwent resection 12/1. Intubated 12/1-12/15. Had FEES 12/21 with recomendation for NPO with occasional tsp honey thick water. MBS today to assess for improvements in swallow function.  No data recorded Assessment / Plan / Recommendation CHL IP CLINICAL IMPRESSIONS 03/15/2020 Clinical Impression Pt's oropharyngeal abilities have improved since FEES 12/21. Oral phase marked by lingual pumping, minimally decreased cohesion and prolonged mastication of solid. Transiently swallow occured at the valleculae and pyriform sinuses where barium collected for 4-6 seconds before swallow. Thin via straw was aspirated from barium in pyriform sinuses with immediate cough pushing barium towards his epiglottis but not completely clearing. Two trials thin via cup did not enter vestibule however, is not recommended due to his poor cognition and aphasia for consistent execution of strategies. Valleculae and pyriform sinuses residue ranged mild-max (max with solid). A puree texture, nectar thick liquids, pills crushed and FULL supervision is recommended. Cortrak in place until is intake is safe and adequate. SLP Visit Diagnosis Dysphagia, oropharyngeal phase (R13.12);Aphasia (R47.01) Attention and concentration deficit following -- Frontal lobe and executive function deficit following -- Impact on safety and function Mild aspiration risk;Moderate aspiration risk   CHL IP TREATMENT RECOMMENDATION 03/15/2020 Treatment Recommendations Therapy as outlined in treatment plan below   Prognosis 03/15/2020 Prognosis for Safe Diet Advancement Good Barriers to Reach Goals Severity of deficits Barriers/Prognosis Comment -- CHL IP DIET RECOMMENDATION 03/15/2020 SLP Diet Recommendations Dysphagia 1 (Puree) solids;Honey thick liquids Liquid  Administration via Cup Medication Administration  Crushed with puree Compensations Slow rate;Small sips/bites;Multiple dry swallows after each bite/sip Postural Changes Seated upright at 90 degrees   CHL IP OTHER RECOMMENDATIONS 03/15/2020 Recommended Consults -- Oral Care Recommendations Oral care BID Other Recommendations --   CHL IP FOLLOW UP RECOMMENDATIONS 03/15/2020 Follow up Recommendations Skilled Nursing facility   [facility] IP FREQUENCY AND DURATION 03/15/2020 Speech Therapy Frequency (ACUTE ONLY) min 2x/week Treatment Duration 2 weeks      CHL IP ORAL PHASE 03/15/2020 Oral Phase Impaired Oral - Pudding Teaspoon -- Oral - Pudding Cup -- Oral - Honey Teaspoon -- Oral - Honey Cup Lingual pumping;Delayed oral transit Oral - Nectar Teaspoon -- Oral - Nectar Cup Decreased bolus cohesion Oral - Nectar Straw WFL Oral - Thin Teaspoon -- Oral - Thin Cup WFL Oral - Thin Straw -- Oral - Puree Lingual pumping Oral - Mech Soft -- Oral - Regular (No Data) Oral - Multi-Consistency -- Oral - Pill -- Oral Phase - Comment --  CHL IP PHARYNGEAL PHASE 03/15/2020 Pharyngeal Phase -- Pharyngeal- Pudding Teaspoon -- Pharyngeal -- Pharyngeal- Pudding Cup -- Pharyngeal -- Pharyngeal- Honey Teaspoon NT Pharyngeal -- Pharyngeal- Honey Cup Delayed swallow initiation-vallecula;Delayed swallow initiation-pyriform sinuses;Pharyngeal residue - valleculae Pharyngeal Material does not enter airway Pharyngeal- Nectar Teaspoon NT Pharyngeal -- Pharyngeal- Nectar Cup Delayed swallow initiation-vallecula Pharyngeal Material does not enter airway Pharyngeal- Nectar Straw Delayed swallow initiation-vallecula Pharyngeal Material does not enter airway Pharyngeal- Thin Teaspoon -- Pharyngeal -- Pharyngeal- Thin Cup WFL Pharyngeal -- Pharyngeal- Thin Straw Penetration/Aspiration during swallow;Delayed swallow initiation-pyriform sinuses;Pharyngeal residue - valleculae;Pharyngeal residue - pyriform Pharyngeal Material enters airway, passes BELOW cords and not ejected out despite cough attempt by patient  Pharyngeal- Puree Pharyngeal residue - valleculae;Delayed swallow initiation-vallecula Pharyngeal Material does not enter airway Pharyngeal- Mechanical Soft NT Pharyngeal -- Pharyngeal- Regular Pharyngeal residue - valleculae Pharyngeal -- Pharyngeal- Multi-consistency NT Pharyngeal -- Pharyngeal- Pill NT Pharyngeal -- Pharyngeal Comment --  CHL IP CERVICAL ESOPHAGEAL PHASE 03/15/2020 Cervical Esophageal Phase WFL Pudding Teaspoon -- Pudding Cup -- Honey Teaspoon -- Honey Cup -- Nectar Teaspoon -- Nectar Cup -- Nectar Straw -- Thin Teaspoon -- Thin Cup -- Thin Straw -- Puree -- Mechanical Soft -- Regular -- Multi-consistency -- Pill -- Cervical Esophageal Comment -- [place] Siren 03/15/2020, 1:08 PM  Orbie Pyo Litaker M.Ed Actor Pager [phone] Office [phone]             EEG LTVM - Continuous Bedside W/ Video Includes Portable EEG Read  Result Date: 02/20/2020 Lora Havens, MD     02/21/2020  9:29 AM Patient Name: Antonio Glenn MRN: [id number] Epilepsy Attending: Lora Havens Referring Physician/Provider: Dr Amie [place] Duration: 02/19/2020 1119 to 02/20/2020 1119  Patient history: 59 yo M with left parietal brain tumor, recent seizure who has improving but persistent non-focal delirium/encephalopathy. EEG to evaluate for seizure  Level of alertness: Awake, asleep  AEDs during EEG study: Propofol, versed, Phenobarb, Lacosamide  Technical aspects: This EEG study was done with scalp electrodes positioned according to the 10-20 International system of electrode placement. Electrical activity was acquired at a sampling rate of 500Hz  and reviewed with a high frequency filter of 70Hz  and a low frequency filter of 1Hz . EEG data were recorded continuously and digitally stored.  Description: No posterior dominant rhythm was seen. Sleep was characterized by vertex waves, sleep spindles (12 to 14 Hz), maximal frontocentral region. Sharp waves were noted in left temporal  region.Continuous 3-5Hz  theta-delta slowing was  also noted.Hyperventilation and photic stimulation were not performed.   ABNORMALITY -Sharp waves,  left temporal region -Continuous slow  IMPRESSION: This studyshowed evidence of epileptogenicity arising from left temporal region. There is also severe diffuse encephalopathy, non specific etiology but likely secondary to sedation. No seizures were seen throughout the recording.  Priyanka O Yadav   VAS [place] UPPER EXTREMITY VENOUS DUPLEX  Result Date: 03/07/2020 UPPER VENOUS STUDY  Indications: Edema, and Mid line in right arm Comparison Study: No prior study Performing Technologist: Sherren Kerns RVS  Examination Guidelines: A complete evaluation includes B-mode imaging, spectral Doppler, color Doppler, and power Doppler as needed of all accessible portions of each vessel. Bilateral testing is considered an integral part of a complete examination. Limited examinations for reoccurring indications may be performed as noted.  Right Findings: +----------+------------+---------+-----------+----------+--------------------+ RIGHT     CompressiblePhasicitySpontaneousProperties      Summary        +----------+------------+---------+-----------+----------+--------------------+ IJV                      Yes       Yes                                   +----------+------------+---------+-----------+----------+--------------------+ Subclavian               Yes       Yes                                   +----------+------------+---------+-----------+----------+--------------------+ Axillary                 Yes       Yes                                   +----------+------------+---------+-----------+----------+--------------------+ Brachial      Full       Yes       Yes                                   +----------+------------+---------+-----------+----------+--------------------+ Radial        Full                                                        +----------+------------+---------+-----------+----------+--------------------+ Ulnar         Full                                                       +----------+------------+---------+-----------+----------+--------------------+ Cephalic      None       No        No                 Acute, mid-line  present        +----------+------------+---------+-----------+----------+--------------------+ Basilic       Full                                                       +----------+------------+---------+-----------+----------+--------------------+  Summary:  Right: Findings consistent with acute superficial vein thrombosis involving the right cephalic vein.  *See table(s) above for measurements and observations.  Diagnosing physician: Deitra Mayo MD Electronically signed by Deitra Mayo MD on 03/07/2020 at 4:45:37 PM.    Final     ASSESSMENT & PLAN:  Left Parietal Glioblastoma  Antonio Glenn continues to make clinical improvements with regards to cognition, language, motor function, after prolonged course of critical care needs following surgery.  He was unable to participate fully/independently in goals of care conversation because of dysphasia; we encouraged him to continue to work hard to make improvements each day.   Spoke with his daughter Alyson Ingles over the phone; she is aware of pathology results, general prognosis for glioblastoma.  We answered the questions she has at this time.  We recommended carefully re-evaluating his candidacy for radiation and chemotherapy as he progresses through rehab.  This may include repeat MRI brain given >1 month has passed since craniotomy.  She is agreeable with this plan.  In the meantime, can be mindful of burden of psychotropic medications like seroquel, phenobarbital.  As his cognition and behavior stabilizes, these can hopefully be  gently down-titrated concurrently.  We will continue to follow along.  All questions were answered. The patient knows to call the clinic with any problems, questions or concerns.  The total time spent in the encounter was 55 minutes and more than 50% was on counseling and review of test results     Ventura Sellers, MD 03/17/2020 10:23 AM

## 2020-03-17 NOTE — Progress Notes (Signed)
Patient did well with dinner.  Ate 60%.  Initially when asked if he was ready for dinner, patient stated, "No, I'm good I don't need anything."  However when attempted to feed patient anyhow with encouragement, he proceeded to eat without any problem.  After eating 60% he stated he was full.

## 2020-03-17 NOTE — Progress Notes (Signed)
PROGRESS NOTE    Antonio Glenn  HXT:[id number] DOB: Dec 14, 1960 DOA: 02/11/2020 PCP: Leonard Downing, MD   No chief complaint on file.  Brief Narrative: Mr. Wittmeyer is Antonio Glenn 59 yo male with PMH of obesity; admitted on11/25/2021, with complaint of seizures and confusion, was found to have brain mass s/p craniotomy on 12/1//2021. After extubation patient was referred to Hospitalist for medical management. 11/25 admission for seizures 12/1 craniotomy of the left parietal tumor, remains intubated 12/3 core track insertion 12/4 LTM EEG no seizures but epileptogenicity from left temporal region 12/5 prelim pathology report GBM, sent to [facility] for further evaluation, report not available. 12/9 family meeting and conversation with DNR and no intubation. 12/15 extubated to room air 12/17 PCCM signed off, TRH assuming medical care, low-grade fever, resolved on its own.Daughter converted patient back to FULL CODE. 12/19 right upper extremity Doppler shows SVT, no DVT. 12/27: path back consistent with GBM, grade 4. MBSS performed (patient passed for first time and started on dysphagia 1 diet)  Currently further plan is monitor for improvement in mentation. Awaiting discharge to rehab/CIR.   Assessment & Plan:   Active Problems:   Altered mental status   Seizures (HCC)   Encephalopathy acute   Brain mass   New onset [place] Trefz-fib (HCC)   Respiratory failure (HCC)   HAP (hospital-acquired pneumonia)   HTN (hypertension)   Encounter for imaging study to confirm orogastric (OG) tube placement  Acute metabolic encephalopathy- resolved - patient now likely at new baseline (which is rather poor), pleasantly confused today - Likely CNS tumor (now GBM by path), polypharmacy - Continue Seroquel and phenobarbital. - 12/19 opioids changed from scheduled to as needed.  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 Poor prognosis and typically average of 2 year survival - given patient consistently failing swallow evaluations, requiring restraints and mittens to not pull out tubes, and severe debility with right-sided neglect, palliative care consultation warranted at this time to at least begin some discussions on how aggressive family would want to be especially if patient needing PEG tube (started on dysphagia 1 diet on 03/15/2020 - follow for PO intake, ability to tolerate).  Regardless, involving palliative care given underlying GBM is appropriate - Vimpat and phenobarbital for seizures. EEG negative for active seizures.  Acute respiratory failure with hypoxia, POA secondary to encephalopathy. Needed intubation initially. Extubated on 12/15  MRSA HAP - resolved Low-grade fever on 12/17, resolved on its own Prior pneumonia, treated with IV vancomycin. Course completed.  Paroxysmal [place]. Fib Currently rate controlled. Anticoagulation on hold due to CNS mass. Monitor.  Thoracic aortic aneurysm Incidental diagnosis. Repeat CT scan in 6 months.  Goals of care conversation Difficult social situation. Currently patient unable to participate in goals of care conversation. Initially based on the discussion with PCCM and the family patient was DNR; now he has been changed back to Full code. See GBM above  Appreciate palliative care recommendations  Hypokalemia. -Replete and recheck as needed  Dysphagia. -Has been on tube feeds - MBSS on 12/28 (trial of dysphagia 1 diet now) - continue TF until taking in adequate and sufficient diet (need PCM to assist with family discussion if Dravon Nott PEG becomes needed given that patient has required restraints consistently)  Accelerated hypertension. Blood pressurewas uncontrolled, now improved Started on clonidine,will continue. Continue Norvasc; adjusting dose due to BP still slightly above goal  As needed  hydralazine.  Right upper extremity SVT. No evidence of DVT. No need for anticoagulation. Not [place] candidate for anticoagulation regardless.  Pain control. Now on oxycodone 5-10 q6P; scheduled oxycodone has been discontinued  Low-grade temperature. Intermittent. Likely from SVT vs central fever  X-ray negative  Elevated LFTs Follow   DVT prophylaxis: SCD Code Status: full  Family Communication: none at bedside Disposition:   Status is: Inpatient  Remains inpatient appropriate because:Inpatient level of care appropriate due to severity of illness   Dispo: per primary       Consultants:   Neurosurgery is primary  [facility] consulting   PCCM  Procedures:  12/1 craniotomy  Antimicrobials:  Anti-infectives (From admission, onward)   Start     Dose/Rate Route Frequency Ordered Stop   02/29/20 0200  vancomycin (VANCOREADY) IVPB 1750 mg/350 mL        1,750 mg 175 mL/hr over 120 Minutes Intravenous Every 8 hours 02/28/20 1805 03/02/20 1955   02/25/20 1800  vancomycin (VANCOREADY) IVPB 2000 mg/400 mL  Status:  Discontinued        2,000 mg 200 mL/hr over 120 Minutes Intravenous Every 12 hours 02/25/20 1627 02/28/20 1805   02/17/20 2300  ceFAZolin (ANCEF) IVPB 2g/100 mL premix        2 g 200 mL/hr over 30 Minutes Intravenous Every 8 hours 02/17/20 1918 02/18/20 1507   02/17/20 0500  vancomycin (VANCOREADY) IVPB 1500 mg/300 mL        1,500 mg 150 mL/hr over 120 Minutes Intravenous 120 min pre-op 02/15/20 1325 02/18/20 1128         Subjective: Pleasantly confused Asking how to urinate with his condom cath  Objective: Vitals:   03/17/20 0312 03/17/20 0500 03/17/20 0746 03/17/20 1100  BP: (!) 148/102  (!) 155/89 139/88  Pulse: (!) 103  94 98  Resp: 20  17 19   Temp: 98.5 F (36.9 C)  97.6 F (36.4 C) 97.9 F (36.6 C)  TempSrc: Oral  Oral Oral  SpO2: 95%  97% 99%  Weight:  133.8 kg    Height:        Intake/Output Summary (Last 24 hours) at 03/17/2020  1624 Last data filed at 03/17/2020 0900 Gross per 24 hour  Intake 1517 ml  Output 800 ml  Net 717 ml   Filed Weights   03/15/20 0429 03/16/20 0500 03/17/20 0500  Weight: 133.6 kg 133.4 kg 133.8 kg    Examination:  General: No acute distress. Cardiovascular: RRR Lungs: unlabored Abdomen: Soft, nontender, nondistended  Neurological: pleasantly confused, moving all extremities Skin: Warm and dry. No rashes or lesions. Extremities: No clubbing or cyanosis. No edema.    Data Reviewed: I have personally reviewed following labs and imaging studies  CBC: Recent Labs  Lab 03/13/20 1814 03/14/20 0332 03/15/20 0057 03/16/20 0302 03/17/20 0318  WBC 9.3 7.3 9.8 10.4 10.4  NEUTROABS 4.9 3.4 5.6 5.7 5.8  HGB 14.5 14.5 13.8 13.6 13.6  HCT 41.3 44.0 39.8 41.6 39.8  MCV 88.1 92.6 88.4 90.8 89.8  PLT 569* 378 558* 564* 554*    Basic Metabolic Panel: Recent Labs  Lab 03/12/20 0051 03/13/20 1814 03/14/20 0332 03/15/20 0057 03/16/20 0302 03/17/20 0318  NA 137 135 138  --  139 139  K 4.0 4.1 4.1  --  3.9 4.0  CL 104 101 103  --  104 106  CO2 24 24 23   --  25 24  GLUCOSE 109* 121* 109*  --  108* 121*  BUN 15  13 16  --  15 15  CREATININE 0.64 0.61 0.58*  --  0.62 0.58*  CALCIUM 8.6* 8.8* 8.8*  --  8.9 8.8*  MG 2.2 2.2 2.2 2.1 2.1 2.1  PHOS  --   --   --   --   --  3.9    GFR: Estimated Creatinine Clearance: 144.7 mL/min (Abbeygail Igoe) (by C-G formula based on SCr of 0.58 mg/dL (L)).  Liver Function Tests: Recent Labs  Lab 03/16/20 0302 03/17/20 0318  AST 28 22  ALT 70* 57*  ALKPHOS 159* 151*  BILITOT 0.4 0.3  PROT 6.1* 6.1*  ALBUMIN 2.7* 2.7*    CBG: Recent Labs  Lab 03/16/20 2325 03/17/20 0315 03/17/20 0745 03/17/20 1151 03/17/20 1609  GLUCAP 137* 112* 124* 126* 93     No results found for this or any previous visit (from the past 240 hour(s)).       Radiology Studies: No results found.      Scheduled Meds: . amLODipine  10 mg Per Tube Daily  .  bethanechol  10 mg Per Tube TID  . Chlorhexidine Gluconate Cloth  6 each Topical Daily  . cloNIDine  0.1 mg Per Tube Daily  . docusate  100 mg Per Tube BID  . enoxaparin (LOVENOX) injection  40 mg Subcutaneous Q24H  . feeding supplement  237 mL Oral BID BM  . feeding supplement (OSMOLITE 1.5 CAL)  960 mL Per Tube Q24H  . feeding supplement (PROSource TF)  90 mL Per Tube TID  . insulin aspart  0-15 Units Subcutaneous Q4H  . lacosamide  100 mg Per Tube BID  . pantoprazole sodium  40 mg Per Tube Daily  . PHENObarbital  90 mg Per Tube BID  . QUEtiapine  200 mg Per Tube BID   Continuous Infusions: . sodium chloride Stopped (02/26/20 2221)  . sodium chloride Stopped (03/02/20 1734)     LOS: 35 days    Time spent: over 30 min    Fayrene Helper, MD [facility]   To contact the attending provider between 7A-7P or the covering provider during after hours 7P-7A, please log into the web site [email or web address] and access using universal [place] password for that web site. If you do not have the password, please call the hospital operator.  03/17/2020, 4:24 PM

## 2020-03-17 NOTE — Consult Note (Signed)
Consultation Note Date: 03/17/2020   Patient Name: Antonio Glenn  DOB: 1960/05/26  MRN: [id number]  Age / Sex: 59 y.o., male  PCP: Antonio Downing, MD Referring Physician: [name], MD  Reason for Consultation: Establishing goals of care  HPI/Patient Profile: 59 y.o. male  with past medical history of obesity admitted on 02/11/2020 with seizures and confusion. Found to have brain mass s/p craniotomy 12/1. Pathology consistent with GBM, grade 4. Patient has required core track - now tolerating some PO intake. Also with agitation and requiring restraints. PMT consulted to discuss [place].   Clinical Assessment and Goals of Care: I have reviewed medical records including EPIC notes, labs and imaging, received report from RN and Dr. Mickeal Skinner, assessed the patient and spoke with patient's daughter Antonio Glenn to discuss diagnosis prognosis, Antonio Glenn, EOL wishes, disposition and options.  I introduced Palliative Medicine as specialized medical care for people living with serious illness. It focuses on providing relief from the symptoms and stress of a serious illness. The goal is to improve quality of life for both the patient and the family.  We discussed a brief life review of the patient. Patient was living alone. He worked for [employer]. He independently care for himself. No functional or nutritional concern. No major health concerns. Went in for initial workup with PCP d/t some forgetfulness.    We discussed patient's current illness and what it means in the larger context of patient's on-going co-morbidities.  Natural disease trajectory and expectations at EOL were discussed. Antonio Glenn has good understanding of patient's disease, prognosis, and treatment options.   I attempted to elicit values and goals of care important to the patient. Antonio Glenn tells me this is not something they ever discussed. She tells me when he initially found out he had a brain  tumor his response was "everything will be okay, they will fix me". She does share that he has a living will but it is generic - states he would not want to live in vegetative state. She also shares that she knows he would want to be as functional as possible for as long as he is able.   Antonio Glenn shares that she and Dr. Mickeal Skinner discussed a plan of patient going to rehab and working on functional/nutritional before considering further treatment options. She does openly share that she is not sure if they will pursue treatment options but also not ready to make a decision against them either. She is very open to plan of rehab and see how patient does.   Discussed with Antonio Glenn the importance of continued conversation with family and the medical providers regarding overall plan of care and treatment options, ensuring decisions are within the context of the patient's values and GOCs.    Antonio Glenn confirms that patient's ex-wife, Antonio Glenn (Antonio Glenn's mom) is also listed on HCPOA documentation and she tells me they will make decisions together.   Palliative Care services outpatient were explained and offered. Described what palliative care at SNF would entail - continued goals of care conversations and support as patient moves through disease process. Antonio Glenn was open to this.  We did discuss code status - Antonio Glenn shares that she needs to speak to Antonio Glenn about this. She had elected full code before the GBM diagnosis. With the new information, she is not sure full code is appropriate but would like to further discuss with Antonio Glenn prior to making decisions. She is open to further discussions.   Questions and concerns were addressed. The family was encouraged  to call with questions or concerns.   Primary Decision Maker NEXT OF KIN - daughter Antonio Glenn and ex wife Antonio Glenn (both listed as HCPOA per [employer] and Antonio Glenn)  SUMMARY OF RECOMMENDATIONS   - plan for rehab with palliative care to follow outpatient - have  given referral to outpatient pallaitive - family considering change in code status - request time to discuss  Code Status/Advance Care Planning:  Full code - family considering change to DNR  Prognosis:   Unable to determine  Discharge Planning: [place] for rehab with Palliative care service follow-up      Primary Diagnoses: Present on Admission: . Altered mental status   I have reviewed the medical record, interviewed the patient and family, and examined the patient. The following aspects are pertinent.  Past Medical History:  Diagnosis Date  . Brain tumor ([place])    Left parietal brain tumor  . PICC (peripherally inserted central catheter) in place 02/14/2020  . Seizure ([place]) 02/14/2020   Social History   Socioeconomic History  . Marital status: Married    Spouse name: Not on file  . Number of children: Not on file  . Years of education: Not on file  . Highest education level: Not on file  Occupational History  . Not on file  Tobacco Use  . Smoking status: Not on file  . Smokeless tobacco: Not on file  Substance and Sexual Activity  . Alcohol use: Not on file  . Drug use: Not on file  . Sexual activity: Not on file  Other Topics Concern  . Not on file  Social History Narrative  . Not on file   Social Determinants of Health   Financial Resource Strain: Not on file  Food Insecurity: Not on file  Transportation Needs: Not on file  Physical Activity: Not on file  Stress: Not on file  Social Connections: Not on file   History reviewed. No pertinent family history. Scheduled Meds: . amLODipine  10 mg Per Tube Daily  . bethanechol  10 mg Per Tube TID  . Chlorhexidine Gluconate Cloth  6 each Topical Daily  . cloNIDine  0.1 mg Per Tube Daily  . docusate  100 mg Per Tube BID  . enoxaparin (LOVENOX) injection  40 mg Subcutaneous Q24H  . feeding supplement  237 mL Oral BID BM  . feeding supplement (OSMOLITE 1.5 CAL)  960 mL Per Tube Q24H  .  feeding supplement (PROSource TF)  90 mL Per Tube TID  . insulin aspart  0-15 Units Subcutaneous Q4H  . lacosamide  100 mg Per Tube BID  . pantoprazole sodium  40 mg Per Tube Daily  . PHENObarbital  90 mg Per Tube BID  . QUEtiapine  200 mg Per Tube BID   Continuous Infusions: . sodium chloride Stopped (02/26/20 2221)  . sodium chloride Stopped (03/02/20 1734)   PRN Meds:.sodium chloride, acetaminophen **OR** acetaminophen, bisacodyl, diazepam, hydrALAZINE, ondansetron **OR** ondansetron (ZOFRAN) IV, oxyCODONE, polyethylene glycol, promethazine, Resource ThickenUp Clear No Known Allergies Review of Systems  Unable to perform ROS: Mental status change    Physical Exam Constitutional:      General: He is not in acute distress.    Comments: Difficulty communicating  Pulmonary:     Effort: Pulmonary effort is normal. No respiratory distress.  Abdominal:     Comments: Feeding tube remains in nose, not connected to feeds during my eval  Skin:    General: Skin is warm and dry.  Neurological:  Mental Status: He is alert.     Comments: UTA orientation d/t difficulty communicating     Vital Signs: BP 139/88 (BP Location: Right Arm)   Pulse 98   Temp 97.9 F (36.6 C) (Oral)   Resp 19   Ht 6\' 3"  (1.905 m)   Wt 133.8 kg   SpO2 99%   BMI 36.87 kg/m  Pain Scale: 0-10 POSS *See Group Information*: 1-Acceptable,Awake and alert Pain Score: 0-No pain   SpO2: SpO2: 99 % O2 Device:SpO2: 99 % O2 Flow Rate: .O2 Flow Rate (L/min): 4 L/min  IO: Intake/output summary:   Intake/Output Summary (Last 24 hours) at 03/17/2020 1256 Last data filed at 03/17/2020 0900 Gross per 24 hour  Intake 1617 ml  Output 800 ml  Net 817 ml    LBM: Last BM Date: 03/15/20 Baseline Weight: Weight: (!) 148.3 kg Most recent weight: Weight: 133.8 kg     Palliative Assessment/Data: PPS 40%   Flowsheet Rows   Flowsheet Row Most Recent Value  Intake Tab   Referral Department Hospitalist  Unit at  Time of Referral Intermediate Care Unit  Palliative Care Primary Diagnosis Neurology  Date Notified 03/15/20  Palliative Care Type New Palliative care  Reason for referral Clarify Goals of Care  Date of Admission 02/11/20  Date first seen by Palliative Care 03/16/20  # of days Palliative referral response time 1 Day(s)  # of days IP prior to Palliative referral 33  Clinical Assessment   Palliative Performance Scale Score 30%  Psychosocial & Spiritual Assessment   Palliative Care Outcomes      Time Total: 60 minutes Greater than 50%  of this time was spent counseling and coordinating care related to the above assessment and plan.  Juel Burrow, DNP, AGNP-C Palliative Medicine Team [phone] Pager: [phone]

## 2020-03-17 NOTE — Progress Notes (Signed)
Patent examiner [facility])  Hospital Liaison: RN note         Notified by [facility] manager of patient/family request for [facility] Palliative services at rehab after discharge.             [facility] Palliative team will follow up with patient after discharge.          Please call with any hospice or palliative related questions.         Thank you for this referral.         Elsie Saas, RN, CCM  [facility] Liaison (listed on AMION under Hospice[facility])    [phone]

## 2020-03-18 DIAGNOSIS — G9389 Other specified disorders of brain: Secondary | ICD-10-CM | POA: Diagnosis not present

## 2020-03-18 LAB — COMPREHENSIVE METABOLIC PANEL
ALT: 59 U/L — ABNORMAL HIGH (ref 0–44)
AST: 29 U/L (ref 15–41)
Albumin: 2.8 g/dL — ABNORMAL LOW (ref 3.5–5.0)
Alkaline Phosphatase: 162 U/L — ABNORMAL HIGH (ref 38–126)
Anion gap: 9 (ref 5–15)
BUN: 14 mg/dL (ref 6–20)
CO2: 26 mmol/L (ref 22–32)
Calcium: 8.8 mg/dL — ABNORMAL LOW (ref 8.9–10.3)
Chloride: 103 mmol/L (ref 98–111)
Creatinine, Ser: 0.62 mg/dL (ref 0.61–1.24)
GFR, Estimated: >60 mL/min (ref >60)
Glucose, Bld: 117 mg/dL — ABNORMAL HIGH (ref 70–99)
Potassium: 3.8 mmol/L (ref 3.5–5.1)
Sodium: 138 mmol/L (ref 135–145)
Total Bilirubin: 0.5 mg/dL (ref 0.3–1.2)
Total Protein: 6.1 g/dL — ABNORMAL LOW (ref 6.5–8.1)

## 2020-03-18 LAB — MAGNESIUM: Magnesium: 2 mg/dL (ref 1.7–2.4)

## 2020-03-18 LAB — CBC WITH DIFFERENTIAL/PLATELET
Abs Immature Granulocytes: 0.52 10*3/uL — ABNORMAL HIGH (ref 0.00–0.07)
Basophils Absolute: 0.2 10*3/uL — ABNORMAL HIGH (ref 0.0–0.1)
Basophils Relative: 2 %
Eosinophils Absolute: 0.9 10*3/uL — ABNORMAL HIGH (ref 0.0–0.5)
Eosinophils Relative: 8 %
HCT: 40.9 % (ref 39.0–52.0)
Hemoglobin: 13.1 g/dL (ref 13.0–17.0)
Immature Granulocytes: 5 %
Lymphocytes Relative: 16 %
Lymphs Abs: 1.7 10*3/uL (ref 0.7–4.0)
MCH: 29.5 pg (ref 26.0–34.0)
MCHC: 32 g/dL (ref 30.0–36.0)
MCV: 92.1 fL (ref 80.0–100.0)
Monocytes Absolute: 1.3 10*3/uL — ABNORMAL HIGH (ref 0.1–1.0)
Monocytes Relative: 12 %
Neutro Abs: 6.1 10*3/uL (ref 1.7–7.7)
Neutrophils Relative %: 57 %
Platelets: 546 10*3/uL — ABNORMAL HIGH (ref 150–400)
RBC: 4.44 MIL/uL (ref 4.22–5.81)
RDW: 13.5 % (ref 11.5–15.5)
WBC: 10.7 10*3/uL — ABNORMAL HIGH (ref 4.0–10.5)
nRBC: 0 % (ref 0.0–0.2)

## 2020-03-18 LAB — GLUCOSE, CAPILLARY
Glucose-Capillary: 103 mg/dL — ABNORMAL HIGH (ref 70–99)
Glucose-Capillary: 103 mg/dL — ABNORMAL HIGH (ref 70–99)
Glucose-Capillary: 103 mg/dL — ABNORMAL HIGH (ref 70–99)
Glucose-Capillary: 105 mg/dL — ABNORMAL HIGH (ref 70–99)
Glucose-Capillary: 122 mg/dL — ABNORMAL HIGH (ref 70–99)
Glucose-Capillary: 126 mg/dL — ABNORMAL HIGH (ref 70–99)
Glucose-Capillary: 127 mg/dL — ABNORMAL HIGH (ref 70–99)

## 2020-03-18 LAB — PHOSPHORUS: Phosphorus: 3.7 mg/dL (ref 2.5–4.6)

## 2020-03-18 LAB — MRSA PCR SCREENING: MRSA by PCR: NEGATIVE

## 2020-03-18 MED ORDER — HALOPERIDOL LACTATE 5 MG/ML IJ SOLN
2.0000 mg | Freq: Once | INTRAMUSCULAR | Status: AC
  Administered 2020-03-18: 2 mg via INTRAVENOUS
  Filled 2020-03-18: qty 1

## 2020-03-18 NOTE — TOC Progression Note (Signed)
Transition of Care [facility]) - Progression Note    Patient Details  Name: Antonio Glenn MRN: [id number] Date of Birth: [birth year]  Transition of Care [facility]) CM/SW Contact  Eduard Roux, [place] Phone Number: 03/18/2020, 12:39 PM  Clinical Narrative:     CSW continuing  to follow and assist with discharge plan.   [name], MSW, LCSW Clinical Social Worker   Expected Discharge Plan: Skilled Nursing Facility Barriers to Discharge: SNF Pending bed offer  Expected Discharge Plan and Services Expected Discharge Plan: Skilled Nursing Facility   Discharge Planning Services: CM Consult Post Acute Care Choice: Skilled Nursing Facility Living arrangements for the past 2 months: Single Family Home                                       Social Determinants of Health (SDOH) Interventions    Readmission Risk Interventions No flowsheet data found.

## 2020-03-18 NOTE — Progress Notes (Addendum)
PROGRESS NOTE    Antonio Glenn  YWV:[id number] DOB: 07-07-1960 DOA: 02/11/2020 PCP: Leonard Downing, MD   No chief complaint on file.  Brief Narrative: Antonio Glenn is Antonio Glenn 59 yo male with PMH of obesity; admitted on11/25/2021, with complaint of seizures and confusion, was found to have brain mass s/p craniotomy on 12/1//2021. After extubation patient was referred to Hospitalist for medical management. 11/25 admission for seizures 12/1 craniotomy of the left parietal tumor, remains intubated 12/3 core track insertion 12/4 LTM EEG no seizures but epileptogenicity from left temporal region 12/5 prelim pathology report GBM, sent to [facility] for further evaluation, report Antonio available. 12/9 family meeting and conversation with DNR and no intubation. 12/15 extubated to room air 12/17 PCCM signed off, TRH assuming medical care, low-grade fever, resolved on its own.Daughter converted patient back to FULL CODE. 12/19 right upper extremity Doppler shows SVT, no DVT. 12/27: path back consistent with GBM, grade 4. MBSS performed (patient passed for first time and started on dysphagia 1 diet)  Currently further plan is monitor for improvement in mentation. Awaiting discharge to rehab/CIR.   Assessment & Plan:   Active Problems:   Altered mental status   Seizures (HCC)   Encephalopathy acute   Brain mass   New onset Antonio Glenn (HCC)   Respiratory failure (HCC)   HAP (hospital-acquired pneumonia)   HTN (hypertension)   Encounter for imaging study to confirm orogastric (OG) tube placement  Acute metabolic encephalopathy- resolved - patient now likely at new baseline (which is rather poor), pleasantly confused today - Likely CNS tumor (now GBM by path), polypharmacy - Continue Seroquel and phenobarbital. - 12/19 opioids changed from scheduled to as needed.  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 Poor prognosis and typically average of 2 year survival - given patient consistently failing swallow evaluations, requiring restraints and mittens to Antonio pull out tubes, and severe debility with right-sided neglect, palliative care consultation warranted at this time to at least begin some discussions on how aggressive family would want to be especially if patient needing PEG tube (started on dysphagia 1 diet on 03/15/2020 - follow for PO intake, ability to tolerate).  Regardless, involving palliative care given underlying GBM is appropriate - Vimpat and phenobarbital for seizures. EEG negative for active seizures.  Acute respiratory failure with hypoxia, POA secondary to encephalopathy. Needed intubation initially. Extubated on 12/15  MRSA HAP - resolved Low-grade fever on 12/17, resolved on its own Prior pneumonia, treated with IV vancomycin. Course completed.  Paroxysmal Antonio Glenn. Fib Currently rate controlled. Anticoagulation on hold due to CNS mass. Monitor.  Thoracic aortic aneurysm Incidental diagnosis. Repeat CT scan in 6 months.  Goals of care conversation Difficult social situation. Currently patient unable to participate in goals of care conversation. Initially based on the discussion with PCCM and the family patient was DNR; now he has been changed back to Full code. See GBM above  Appreciate palliative care recommendations -> pending note with plan for rehab with palliative care to follow outpatient, family considering code status - appreciate assitance  Hypokalemia. -Replete and recheck as needed  Dysphagia. -Has been on tube feeds - MBSS on 12/28 (trial of dysphagia 1 diet now) - continue TF until taking in adequate and sufficient diet (need PCM to assist with family discussion if Antonio Glenn PEG becomes needed given that patient has required restraints consistently)  Accelerated hypertension. Blood pressurewas uncontrolled, now  improved Started on clonidine,will continue. Continue Norvasc; adjusting dose due to BP still slightly above goal  As needed hydralazine.  Right upper extremity SVT. No evidence of DVT. No need for anticoagulation. Antonio Glenn candidate for anticoagulation regardless.  Pain control. Now on oxycodone 5-10 q6P; scheduled oxycodone has been discontinued  Low-grade temperature. Intermittent. Likely from SVT vs central fever  X-ray negative  Elevated LFTs Follow   DVT prophylaxis: SCD Code Status: full  Family Communication: none at bedside Disposition:   Status is: Inpatient  Remains inpatient appropriate because:Inpatient level of care appropriate due to severity of illness   Dispo: per primary       Consultants:   Neurosurgery is primary  [facility] consulting   PCCM  Procedures:  12/1 craniotomy  Antimicrobials:  Anti-infectives (From admission, onward)   Start     Dose/Rate Route Frequency Ordered Stop   02/29/20 0200  vancomycin (VANCOREADY) IVPB 1750 mg/350 mL        1,750 mg 175 mL/hr over 120 Minutes Intravenous Every 8 hours 02/28/20 1805 03/02/20 1955   02/25/20 1800  vancomycin (VANCOREADY) IVPB 2000 mg/400 mL  Status:  Discontinued        2,000 mg 200 mL/hr over 120 Minutes Intravenous Every 12 hours 02/25/20 1627 02/28/20 1805   02/17/20 2300  ceFAZolin (ANCEF) IVPB 2g/100 mL premix        2 g 200 mL/hr over 30 Minutes Intravenous Every 8 hours 02/17/20 1918 02/18/20 1507   02/17/20 0500  vancomycin (VANCOREADY) IVPB 1500 mg/300 mL        1,500 mg 150 mL/hr over 120 Minutes Intravenous 120 min pre-op 02/15/20 1325 02/18/20 1128         Subjective: Irritated today  Objective: Vitals:   03/18/20 0045 03/18/20 0400 03/18/20 0731 03/18/20 1200  BP: (!) 124/91 (!) 140/96 (!) 146/80 129/90  Pulse:   84 92  Resp: 16 16  17   Temp:  97.9 F (36.6 C) 97.8 F (36.6 C) 97.8 F (36.6 C)  TempSrc:  Axillary Oral Oral  SpO2:  100% 98% 99%  Weight:       Height:        Intake/Output Summary (Last 24 hours) at 03/18/2020 1456 Last data filed at 03/18/2020 0946 Gross per 24 hour  Intake 2493 ml  Output 700 ml  Net 1793 ml   Filed Weights   03/15/20 0429 03/16/20 0500 03/17/20 0500  Weight: 133.6 kg 133.4 kg 133.8 kg    Examination:  General: No acute distress. Cardiovascular: RRR Lungs: unlabored Abdomen: Soft, nontender, nondistended Neurological: Confused. Moves all extremities 4. Cranial nerves II through XII grossly intact. Skin: Warm and dry. No rashes or lesions. Extremities: No clubbing or cyanosis. No edema.   Data Reviewed: I have personally reviewed following labs and imaging studies  CBC: Recent Labs  Lab 03/14/20 0332 03/15/20 0057 03/16/20 0302 03/17/20 0318 03/18/20 0406  WBC 7.3 9.8 10.4 10.4 10.7*  NEUTROABS 3.4 5.6 5.7 5.8 6.1  HGB 14.5 13.8 13.6 13.6 13.1  HCT 44.0 39.8 41.6 39.8 40.9  MCV 92.6 88.4 90.8 89.8 92.1  PLT 378 558* 564* 554* 546*    Basic Metabolic Panel: Recent Labs  Lab 03/13/20 1814 03/14/20 0332 03/15/20 0057 03/16/20 0302 03/17/20 0318 03/18/20 0406  NA 135 138  --  139 139 138  K 4.1 4.1  --  3.9 4.0 3.8  CL 101 103  --  104 106 103  CO2 24 23  --  25  24 26  GLUCOSE 121* 109*  --  108* 121* 117*  BUN 13 16  --  15 15 14   CREATININE 0.61 0.58*  --  0.62 0.58* 0.62  CALCIUM 8.8* 8.8*  --  8.9 8.8* 8.8*  MG 2.2 2.2 2.1 2.1 2.1 2.0  PHOS  --   --   --   --  3.9 3.7    GFR: Estimated Creatinine Clearance: 144.7 mL/min (by C-G formula based on SCr of 0.62 mg/dL).  Liver Function Tests: Recent Labs  Lab 03/16/20 0302 03/17/20 0318 03/18/20 0406  AST 28 22 29   ALT 70* 57* 59*  ALKPHOS 159* 151* 162*  BILITOT 0.4 0.3 0.5  PROT 6.1* 6.1* 6.1*  ALBUMIN 2.7* 2.7* 2.8*    CBG: Recent Labs  Lab 03/17/20 2014 03/18/20 0032 03/18/20 0411 03/18/20 0732 03/18/20 1151  GLUCAP 108* 105* 127* 126* 103*     No results found for this or any previous visit  (from the past 240 hour(s)).       Radiology Studies: No results found.      Scheduled Meds: . amLODipine  10 mg Per Tube Daily  . bethanechol  10 mg Per Tube TID  . Chlorhexidine Gluconate Cloth  6 each Topical Daily  . cloNIDine  0.1 mg Per Tube Daily  . docusate  100 mg Per Tube BID  . enoxaparin (LOVENOX) injection  40 mg Subcutaneous Q24H  . feeding supplement  237 mL Oral BID BM  . feeding supplement (OSMOLITE 1.5 CAL)  960 mL Per Tube Q24H  . feeding supplement (PROSource TF)  90 mL Per Tube TID  . insulin aspart  0-15 Units Subcutaneous Q4H  . lacosamide  100 mg Per Tube BID  . pantoprazole sodium  40 mg Per Tube Daily  . PHENObarbital  90 mg Per Tube BID  . QUEtiapine  200 mg Per Tube BID   Continuous Infusions: . sodium chloride Stopped (02/26/20 2221)  . sodium chloride Stopped (03/02/20 1734)     LOS: 36 days    Time spent: over 30 min    Fayrene Helper, MD [facility]   To contact the attending provider between 7A-7P or the covering provider during after hours 7P-7A, please log into the web site [email or web address] and access using universal [place] password for that web site. If you do Antonio have the password, please call the hospital operator.  03/18/2020, 2:56 PM

## 2020-03-18 NOTE — Progress Notes (Addendum)
Patient very agitated, restless, screaming out. Attempting to slide out of bed  with restraints on. Unable to redirect at this time. On call provider paged due to no PRN available at this time for agitation.    2316 PRN 2mg  IV haldol given as ordered.

## 2020-03-18 NOTE — Progress Notes (Signed)
Patient did not receive resting affect from PRN. Patient continues to be restless, agitated, hallucinating, and attempting to slide down in bed causing cortrak to be pulled on. Patient is not redirectable at this time. On call provider paged.

## 2020-03-18 NOTE — Progress Notes (Signed)
   Providing Compassionate, Quality Care - Together  NEUROSURGERY PROGRESS NOTE   S: No issues overnight. Tolerated 60% dinner, all of breakfast, not eating lunch  O: EXAM:  BP (!) 146/80 (BP Location: Left Arm)   Pulse 84   Temp 97.8 F (36.6 C) (Oral)   Resp 16   Ht 6\' 3"  (1.905 m)   Wt 133.8 kg   SpO2 98%   BMI 36.87 kg/m   Awake, alert, disoriented, hallucinating PERRL EOMI MAE equally In restraints  ASSESSMENT:  59 y.o. male with  GBM  S/p crani for resection 02/18/2020  PLAN: -SNF pending, bed avail, still needing to progress with diet intake before dc/limit restraints and remove feeding tube -pt/ot -scds -pain control   Thank you for allowing me to participate in this patient's care.  Please do not hesitate to call with questions or concerns.   Elwin Sleight, [facility] Cell: [phone]

## 2020-03-19 DIAGNOSIS — Z7189 Other specified counseling: Secondary | ICD-10-CM | POA: Diagnosis not present

## 2020-03-19 DIAGNOSIS — Z515 Encounter for palliative care: Secondary | ICD-10-CM | POA: Diagnosis not present

## 2020-03-19 DIAGNOSIS — Z66 Do not resuscitate: Secondary | ICD-10-CM

## 2020-03-19 DIAGNOSIS — G9389 Other specified disorders of brain: Secondary | ICD-10-CM | POA: Diagnosis not present

## 2020-03-19 LAB — COMPREHENSIVE METABOLIC PANEL
ALT: 60 U/L — ABNORMAL HIGH (ref 0–44)
AST: 48 U/L — ABNORMAL HIGH (ref 15–41)
Albumin: 3 g/dL — ABNORMAL LOW (ref 3.5–5.0)
Alkaline Phosphatase: 158 U/L — ABNORMAL HIGH (ref 38–126)
Anion gap: 13 (ref 5–15)
BUN: 16 mg/dL (ref 6–20)
CO2: 20 mmol/L — ABNORMAL LOW (ref 22–32)
Calcium: 8.8 mg/dL — ABNORMAL LOW (ref 8.9–10.3)
Chloride: 104 mmol/L (ref 98–111)
Creatinine, Ser: 0.57 mg/dL — ABNORMAL LOW (ref 0.61–1.24)
GFR, Estimated: >60 mL/min (ref >60)
Glucose, Bld: 83 mg/dL (ref 70–99)
Potassium: 5.6 mmol/L — ABNORMAL HIGH (ref 3.5–5.1)
Sodium: 137 mmol/L (ref 135–145)
Total Bilirubin: UNDETERMINED mg/dL (ref 0.3–1.2)
Total Protein: 6 g/dL — ABNORMAL LOW (ref 6.5–8.1)

## 2020-03-19 LAB — GLUCOSE, CAPILLARY
Glucose-Capillary: 104 mg/dL — ABNORMAL HIGH (ref 70–99)
Glucose-Capillary: 118 mg/dL — ABNORMAL HIGH (ref 70–99)
Glucose-Capillary: 124 mg/dL — ABNORMAL HIGH (ref 70–99)
Glucose-Capillary: 129 mg/dL — ABNORMAL HIGH (ref 70–99)
Glucose-Capillary: 133 mg/dL — ABNORMAL HIGH (ref 70–99)
Glucose-Capillary: 140 mg/dL — ABNORMAL HIGH (ref 70–99)

## 2020-03-19 LAB — CBC WITH DIFFERENTIAL/PLATELET
Abs Immature Granulocytes: 0.43 10*3/uL — ABNORMAL HIGH (ref 0.00–0.07)
Basophils Absolute: 0.1 10*3/uL (ref 0.0–0.1)
Basophils Relative: 1 %
Eosinophils Absolute: 0.7 10*3/uL — ABNORMAL HIGH (ref 0.0–0.5)
Eosinophils Relative: 7 %
HCT: 40.5 % (ref 39.0–52.0)
Hemoglobin: 13.8 g/dL (ref 13.0–17.0)
Immature Granulocytes: 4 %
Lymphocytes Relative: 15 %
Lymphs Abs: 1.6 10*3/uL (ref 0.7–4.0)
MCH: 30.4 pg (ref 26.0–34.0)
MCHC: 34.1 g/dL (ref 30.0–36.0)
MCV: 89.2 fL (ref 80.0–100.0)
Monocytes Absolute: 1.2 10*3/uL — ABNORMAL HIGH (ref 0.1–1.0)
Monocytes Relative: 11 %
Neutro Abs: 6.3 10*3/uL (ref 1.7–7.7)
Neutrophils Relative %: 62 %
Platelets: 536 10*3/uL — ABNORMAL HIGH (ref 150–400)
RBC: 4.54 MIL/uL (ref 4.22–5.81)
RDW: 13.6 % (ref 11.5–15.5)
WBC: 10.4 10*3/uL (ref 4.0–10.5)
nRBC: 0 % (ref 0.0–0.2)

## 2020-03-19 LAB — POTASSIUM: Potassium: 4 mmol/L (ref 3.5–5.1)

## 2020-03-19 LAB — PHOSPHORUS: Phosphorus: 4.4 mg/dL (ref 2.5–4.6)

## 2020-03-19 LAB — MAGNESIUM: Magnesium: 2.1 mg/dL (ref 1.7–2.4)

## 2020-03-19 MED ORDER — HALOPERIDOL 1 MG PO TABS
2.0000 mg | ORAL_TABLET | Freq: Four times a day (QID) | ORAL | Status: DC | PRN
  Administered 2020-03-21 – 2020-04-10 (×14): 2 mg via ORAL
  Filled 2020-03-19 (×15): qty 2

## 2020-03-19 MED ORDER — LORAZEPAM 2 MG/ML IJ SOLN
0.5000 mg | Freq: Once | INTRAMUSCULAR | Status: AC
  Administered 2020-03-19: 0.5 mg via INTRAVENOUS
  Filled 2020-03-19: qty 1

## 2020-03-19 MED ORDER — HALOPERIDOL LACTATE 5 MG/ML IJ SOLN
5.0000 mg | Freq: Once | INTRAMUSCULAR | Status: AC
  Administered 2020-03-19: 5 mg via INTRAVENOUS
  Filled 2020-03-19: qty 1

## 2020-03-19 MED ORDER — HALOPERIDOL LACTATE 5 MG/ML IJ SOLN
2.0000 mg | Freq: Four times a day (QID) | INTRAMUSCULAR | Status: DC | PRN
  Administered 2020-03-23 – 2020-04-11 (×5): 2 mg via INTRAMUSCULAR
  Filled 2020-03-19 (×6): qty 1

## 2020-03-19 NOTE — Progress Notes (Addendum)
Patient currently screaming, not following commands, hallucinating. Not  Redirectable, PRN valium given at 0430. Patient currently still agitated and yelling out. Patient still non-monitored due patient removing tele wires by moving and sliding down in bed. On call notified. 5mg  of IV Haldol given as ordered.

## 2020-03-19 NOTE — Progress Notes (Signed)
Daily Progress Note   Patient Name: Antonio Glenn       Date: 03/19/2020 DOB: 09/26/1960  Age: 60 y.o. MRN#: [id number] Attending Physician: [name], MD Primary Care Physician: Leonard Downing, MD Admit Date: 02/11/2020  Reason for Consultation/Follow-up: Establishing goals of care  Subjective: Patient in bed with bilateral wrist restraints, seems agitated but calmer than described overnight  Length of Stay: 37  Current Medications: Scheduled Meds:  . amLODipine  10 mg Per Tube Daily  . bethanechol  10 mg Per Tube TID  . Chlorhexidine Gluconate Cloth  6 each Topical Daily  . cloNIDine  0.1 mg Per Tube Daily  . docusate  100 mg Per Tube BID  . enoxaparin (LOVENOX) injection  40 mg Subcutaneous Q24H  . feeding supplement  237 mL Oral BID BM  . feeding supplement (OSMOLITE 1.5 CAL)  960 mL Per Tube Q24H  . feeding supplement (PROSource TF)  90 mL Per Tube TID  . insulin aspart  0-15 Units Subcutaneous Q4H  . lacosamide  100 mg Per Tube BID  . pantoprazole sodium  40 mg Per Tube Daily  . PHENObarbital  90 mg Per Tube BID  . QUEtiapine  200 mg Per Tube BID    Continuous Infusions: . sodium chloride Stopped (02/26/20 2221)  . sodium chloride Stopped (03/02/20 1734)    PRN Meds: sodium chloride, acetaminophen **OR** acetaminophen, bisacodyl, diazepam, hydrALAZINE, ondansetron **OR** ondansetron (ZOFRAN) IV, oxyCODONE, polyethylene glycol, promethazine, Resource ThickenUp Clear  Physical Exam Constitutional:      Comments: Drowsy, verbal but disoriented, slightly agitated  Pulmonary:     Effort: Pulmonary effort is normal.  Skin:    General: Skin is warm and dry.             Vital Signs: BP (!) 185/95 (BP Location: Left Arm)   Pulse 99   Temp 98.1 F (36.7 C) (Oral)    Resp 20   Ht 6\' 3"  (1.905 m)   Wt 133.8 kg   SpO2 96%   BMI 36.87 kg/m  SpO2: SpO2: 96 % O2 Device: O2 Device: Room Air O2 Flow Rate: O2 Flow Rate (L/min): 4 L/min  Intake/output summary:   Intake/Output Summary (Last 24 hours) at 03/19/2020 1242 Last data filed at 03/18/2020 1841 Gross per 24 hour  Intake 120 ml  Output 500 ml  Net -380 ml   LBM: Last BM Date: 03/18/20 Baseline Weight: Weight: (!) 148.3 kg Most recent weight: Weight: 133.8 kg       Palliative Assessment/Data: PPS 40%    Flowsheet Rows   Flowsheet Row Most Recent Value  Intake Tab   Referral Department Hospitalist  Unit at Time of Referral Intermediate Care Unit  Palliative Care Primary Diagnosis Neurology  Date Notified 03/15/20  Palliative Care Type New Palliative care  Reason for referral Clarify Goals of Care  Date of Admission 02/11/20  Date first seen by Palliative Care 03/16/20  # of days Palliative referral response time 1 Day(s)  # of days IP prior to Palliative referral 33  Clinical Assessment   Palliative Performance Scale Score 30%  Psychosocial & Spiritual Assessment   Palliative Care Outcomes   Patient/Family meeting held?  Yes  Who was at the meeting? daughter  [place] goals of care, Provided psychosocial or spiritual support, Linked to palliative care logitudinal support      Patient Active Problem List   Diagnosis Date Noted  . Encounter for imaging study to confirm orogastric (OG) tube placement   . HTN (hypertension) 03/11/2020  . Respiratory failure ([place])   . HAP (hospital-acquired pneumonia)   . Altered mental status 02/11/2020  . Seizures ([place])   . Encephalopathy acute   . Brain mass   . New onset a-fib [facility])     Palliative Care Assessment & Plan   HPI: 60 y.o. male  with past medical history of obesity admitted on 02/11/2020 with seizures and confusion. Found to have brain mass s/p craniotomy 12/1. Pathology consistent with GBM, grade  4. Patient has required core track - now tolerating some PO intake. Also with agitation and requiring restraints. PMT consulted to discuss [place].   Assessment: R/u with [name]. We review patient's status including agitation overnight. Reviewed medications needed to calm Mr. Lovena Le. [name] continues to hope for improvement, specifically in Mr. Sabbagh's functional status. She continues to be interested in rehab placement. Still unsure about future treatment options but wants to see how he does in rehab.  [name] shares that though Mr. Loconte was very sweet at baseline, he was also very stubborn. She asks that I relay this to the medical team as she feels he may need some "tough love" specifically from PT/OT. Specifically saying he may need some firmness in approach.  We review his code status. She tells me she has been able to discuss this with [name] and they both agree that DNR is appropriate given his diagnosis. They feel this would be in line with his wishes. I shared my support of that decision.   Recommendations/Plan: Code status change to DNR Family continues to hope for improvement in functional status and move to rehab Outpatient palliative to follow at rehab  Code Status: DNR  Discharge Planning: [place] for rehab with Palliative care service follow-up  Care plan was discussed with RN and daughter  Thank you for allowing the Palliative Medicine Team to assist in the care of this patient.   Total Time 25 minutes Prolonged Time Billed  no       Greater than 50%  of this time was spent counseling and coordinating care related to the above assessment and plan.  Juel Burrow, DNP, [facility] Palliative Medicine Team Team Phone # [phone]  Pager [phone]

## 2020-03-19 NOTE — Progress Notes (Signed)
PROGRESS NOTE    Antonio Glenn  YEL:[id number] DOB: 1960-08-29 DOA: 02/11/2020 PCP: Antonio Downing, MD   No chief complaint on file.  Brief Narrative: [name] is Antonio Glenn 60 yo male with PMH of obesity; admitted on11/25/2021, with complaint of seizures and confusion, was found to have brain mass s/p craniotomy on 12/1//2021. After extubation patient was referred to Hospitalist for medical management. 11/25 admission for seizures 12/1 craniotomy of the left parietal tumor, remains intubated 12/3 core track insertion 12/4 LTM EEG no seizures but epileptogenicity from left temporal region 12/5 prelim pathology report GBM, sent to [facility] for further evaluation, report not available. 12/9 family meeting and conversation with DNR and no intubation. 12/15 extubated to room air 12/17 PCCM signed off, TRH assuming medical care, low-grade fever, resolved on its own.Daughter converted patient back to FULL CODE. 12/19 right upper extremity Doppler shows SVT, no DVT. 12/27: path back consistent with GBM, grade 4. MBSS performed (patient passed for first time and started on dysphagia 1 diet)  Currently further plan is monitor for improvement in mentation. Awaiting discharge to rehab/CIR.   Assessment & Plan:   Active Problems:   Altered mental status   Seizures (HCC)   Encephalopathy acute   Brain mass   New onset Antonio Glenn (HCC)   Respiratory failure (HCC)   HAP (hospital-acquired pneumonia)   HTN (hypertension)   Encounter for imaging study to confirm orogastric (OG) tube placement  Acute metabolic encephalopathy - hallucinating, restless overnight, required ativan and haldol  - patient now likely at new baseline (which is rather poor) - Likely CNS tumor (now GBM by path), polypharmacy - Continue Seroquel and phenobarbital - will add haldol prn -> follow EKG for follow up qtc - 12/19 opioids changed from scheduled to as needed.  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 Poor prognosis and typically average of 2 year survival - given patient consistently failing swallow evaluations, requiring restraints and mittens to not pull out tubes, and severe debility with right-sided neglect, palliative care consultation warranted at this time to at least begin some discussions on how aggressive family would want to be especially if patient needing PEG tube (started on dysphagia 1 diet on 03/15/2020 - follow for PO intake, ability to tolerate).  Regardless, involving palliative care given underlying GBM is appropriate - Vimpat and phenobarbital for seizures. EEG negative for active seizures.  Acute respiratory failure with hypoxia, POA secondary to encephalopathy. Needed intubation initially. Extubated on 12/15  MRSA HAP - resolved Low-grade fever on 12/17, resolved on its own Prior pneumonia, treated with IV vancomycin. Course completed.  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 Anticoagulation on hold due to CNS mass. Monitor.  Thoracic aortic aneurysm Incidental diagnosis. Repeat CT scan in 6 months.  Goals of care conversation Difficult social situation. Currently patient unable to participate in goals of care conversation. Initially based on the discussion with PCCM and the family patient was DNR; now he has been changed back to Full code. See GBM above  Appreciate palliative care recommendations -> pending note with plan for rehab with palliative care to follow outpatient, family has decided on DNR after discussion with palliative  Hypokalemia. -Replete and recheck as needed  Dysphagia. -Has been on tube feeds - ate 75% of breakfast this AM - MBSS on 12/28 (trial of dysphagia 1 diet now) - continue TF until taking in adequate and sufficient diet (  need PCM to assist with family discussion if Antonio Glenn PEG becomes needed given  that patient has required restraints consistently)  Accelerated hypertension. BP up this AM, maybe related to agitation, follow today Started on clonidine,will continue. Continue Norvasc; adjusting dose due to BP still slightly above goal  As needed hydralazine.  Right upper extremity SVT. No evidence of DVT. No need for anticoagulation. Not Antonio Glenn candidate for anticoagulation regardless.  Pain control. Now on oxycodone 5-10 q6P; scheduled oxycodone has been discontinued  Low-grade temperature. Intermittent. Likely from SVT vs central fever  X-ray negative  Elevated LFTs Follow, mild  DVT prophylaxis: SCD Code Status: full  Family Communication: none at bedside Disposition:   Status is: Inpatient  Remains inpatient appropriate because:Inpatient level of care appropriate due to severity of illness   Dispo: per primary       Consultants:   Neurosurgery is primary  [facility] consulting   PCCM  Procedures:  12/1 craniotomy  Antimicrobials:  Anti-infectives (From admission, onward)   Start     Dose/Rate Route Frequency Ordered Stop   02/29/20 0200  vancomycin (VANCOREADY) IVPB 1750 mg/350 mL        1,750 mg 175 mL/hr over 120 Minutes Intravenous Every 8 hours 02/28/20 1805 03/02/20 1955   02/25/20 1800  vancomycin (VANCOREADY) IVPB 2000 mg/400 mL  Status:  Discontinued        2,000 mg 200 mL/hr over 120 Minutes Intravenous Every 12 hours 02/25/20 1627 02/28/20 1805   02/17/20 2300  ceFAZolin (ANCEF) IVPB 2g/100 mL premix        2 g 200 mL/hr over 30 Minutes Intravenous Every 8 hours 02/17/20 1918 02/18/20 1507   02/17/20 0500  vancomycin (VANCOREADY) IVPB 1500 mg/300 mL        1,500 mg 150 mL/hr over 120 Minutes Intravenous 120 min pre-op 02/15/20 1325 02/18/20 1128         Subjective: Agitated, says something nonsensical   Objective: Vitals:   03/19/20 0042 03/19/20 0440 03/19/20 0515 03/19/20 0805  BP: (!) 163/74  (!) 167/92 (!) 185/95  Pulse: (!)  105   99  Resp:    20  Temp: 97.7 F (36.5 C) 97.9 F (36.6 C)  98.1 F (36.7 C)  TempSrc: Axillary Oral  Oral  SpO2:    96%  Weight:      Height:        Intake/Output Summary (Last 24 hours) at 03/19/2020 1538 Last data filed at 03/18/2020 1841 Gross per 24 hour  Intake --  Output 500 ml  Net -500 ml   Filed Weights   03/15/20 0429 03/16/20 0500 03/17/20 0500  Weight: 133.6 kg 133.4 kg 133.8 kg    Examination:  General: No acute distress. Seems frustrated in restraints Cardiovascular: RRR Lungs: unlabroed Abdomen: Soft, nontender, nondistended  Neurological: Alert, but disoriented, says nonsensical things, difficult to understand. Moves all extremities 4 . Cranial nerves II through XII grossly intact. Skin: Warm and dry. No rashes or lesions. Extremities: No clubbing or cyanosis. No edema.   Data Reviewed: I have personally reviewed following labs and imaging studies  CBC: Recent Labs  Lab 03/15/20 0057 03/16/20 0302 03/17/20 0318 03/18/20 0406 03/19/20 0408  WBC 9.8 10.4 10.4 10.7* 10.4  NEUTROABS 5.6 5.7 5.8 6.1 6.3  HGB 13.8 13.6 13.6 13.1 13.8  HCT 39.8 41.6 39.8 40.9 40.5  MCV 88.4 90.8 89.8 92.1 89.2  PLT 558* 564* 554* 546* 536*    Basic Metabolic Panel: Recent Labs  Lab 03/14/20  7342 03/15/20 0057 03/16/20 0302 03/17/20 0318 03/18/20 0406 03/19/20 0142 03/19/20 0830  NA 138  --  139 139 138 137  --   K 4.1  --  3.9 4.0 3.8 5.6* 4.0  CL 103  --  104 106 103 104  --   CO2 23  --  _0 20*  --   GLUCOSE 109*  --  108* 121* 117* 83  --   BUN 16  --  _1 --   CREATININE 0.58*  --  0.62 0.58* 0.62 0.57*  --   CALCIUM 8.8*  --  8.9 8.8* 8.8* 8.8*  --   MG 2.2 2.1 2.1 2.1 2.0 2.1  --   PHOS  --   --   --  3.9 3.7 4.4  --     GFR: Estimated Creatinine Clearance: 144.7 mL/min (Antonio Glenn) (by C-G formula based on SCr of 0.57 mg/dL (L)).  Liver Function Tests: Recent Labs  Lab 03/16/20 0302 03/17/20 0318 03/18/20 0406 03/19/20 0142   AST _2 48*  ALT 70* 57* 59* 60*  ALKPHOS 159* 151* 162* 158*  BILITOT 0.4 0.3 0.5 QUANTITY NOT SUFFICIENT, UNABLE TO PERFORM TEST  PROT 6.1* 6.1* 6.1* 6.0*  ALBUMIN 2.7* 2.7* 2.8* 3.0*    CBG: Recent Labs  Lab 03/18/20 2009 03/18/20 2348 03/19/20 0406 03/19/20 0811 03/19/20 1141  GLUCAP 103* 122* 129* 140* 118*     Recent Results (from the past 240 hour(s))  MRSA PCR Screening     Status: None   Collection Time: 03/18/20 11:13 AM   Specimen: Nasal Mucosa; Nasopharyngeal  Result Value Ref Range Status   MRSA by PCR NEGATIVE NEGATIVE Final    Comment:        The GeneXpert MRSA Assay (FDA approved for NASAL specimens only), is one component of Antonio Glenn comprehensive MRSA colonization surveillance program. It is not intended to diagnose MRSA infection nor to guide or monitor treatment for MRSA infections. Performed at [facility], [place] [address] [place], [place] [address]          Radiology Studies: No results found.      Scheduled Meds: . amLODipine  10 mg Per Tube Daily  . bethanechol  10 mg Per Tube TID  . Chlorhexidine Gluconate Cloth  6 each Topical Daily  . cloNIDine  0.1 mg Per Tube Daily  . docusate  100 mg Per Tube BID  . enoxaparin (LOVENOX) injection  40 mg Subcutaneous Q24H  . feeding supplement  237 mL Oral BID BM  . feeding supplement (OSMOLITE 1.5 CAL)  960 mL Per Tube Q24H  . feeding supplement (PROSource TF)  90 mL Per Tube TID  . insulin aspart  0-15 Units Subcutaneous Q4H  . lacosamide  100 mg Per Tube BID  . pantoprazole sodium  40 mg Per Tube Daily  . PHENObarbital  90 mg Per Tube BID  . QUEtiapine  200 mg Per Tube BID   Continuous Infusions: . sodium chloride Stopped (02/26/20 2221)  . sodium chloride Stopped (03/02/20 1734)     LOS: 37 days    Time spent: over 30 min    Fayrene Helper, MD [facility]   To contact the attending provider between 7A-7P or the covering provider during after hours 7P-7A,  please log into the web site [email or web address] and access using universal [place] password for that web site. If you do not have the password, please call the hospital  operator.  03/19/2020, 3:38 PM

## 2020-03-19 NOTE — Progress Notes (Signed)
Subjective: Patient reports that he is uncooperative and non participatory. BUE wrist restraints in place. RN reporting periods of agitation and combativeness overnight requiring Haldol, Valium, and 5 point restraints.   Objective: Vital signs in last 24 hours: Temp:  [97.7 F (36.5 C)-98.2 F (36.8 C)] 98.1 F (36.7 C) (01/01 0805) Pulse Rate:  [89-105] 99 (01/01 0805) Resp:  [17-20] 20 (01/01 0805) BP: (129-185)/(74-96) 185/95 (01/01 0805) SpO2:  [96 %-99 %] 96 % (01/01 0805)  Intake/Output from previous day: 12/31 0701 - 01/01 0700 In: 618.7 [P.O.:360; NG/GT:258.7] Out: 1200 [Urine:1200] Intake/Output this shift: No intake/output data recorded.  Physical Exam: Patient is uncooperative with my exam. He is awake, alert, and resting comfortably in bed. He is in NAD. Disoriented and hallucinating. Bilateral wrist restraints in use. Speech is fluent. MAEW with good power.   Lab Results: Recent Labs    03/18/20 0406 03/19/20 0408  WBC 10.7* 10.4  HGB 13.1 13.8  HCT 40.9 40.5  PLT 546* 536*   BMET Recent Labs    03/18/20 0406 03/19/20 0142 03/19/20 0830  NA 138 137  --   K 3.8 5.6* 4.0  CL 103 104  --   CO2 26 20*  --   GLUCOSE 117* 83  --   BUN 14 16  --   CREATININE 0.62 0.57*  --   CALCIUM 8.8* 8.8*  --     Studies/Results: No results found.  Assessment/Plan: Patient with periods of agitation and combativeness overnight. Currently he is resting comfortably. Pending SNF placement once a bed becomes available. Work with PT/OT. Continued pain control. Limit use of restraints.     LOS: 37 days     Council Mechanic, DNP, NP-C 03/19/2020, 10:22 AM

## 2020-03-19 NOTE — Progress Notes (Addendum)
Patient with ongoing hallucinations, restlessness, agitation combative. On call was notified. Ordered PRN 0.5mg  Ativan IV. On call provider also notified that patient will not keep Telemetry monitor on, and is currently non-monitored at this time until patient will allow staff to place back on.   Consulting civil engineer notified.

## 2020-03-19 NOTE — Progress Notes (Signed)
Pt reportedly extremely agitated last night - see previous notes. This am, pt adjusted in the bed, with ankle restraints removed, wrist restraints on, and posey belt in place if needed. Pt agitated intermittently throughout the day. He was not combative and no PRN's given. Pt had no issues remaining in bed with wrist restraints today.   Robina Ade, RN

## 2020-03-19 NOTE — Progress Notes (Signed)
Patient very combative towards staff, agitated, hallucinating, bilateral wrist restraints still continued. Bilateral ankles restraints applied as ordered. Lap belt continued. For safety of patient. Unable to redirect patient, unable to follow commands.

## 2020-03-20 ENCOUNTER — Inpatient Hospital Stay (HOSPITAL_COMMUNITY): Payer: BC Managed Care – PPO

## 2020-03-20 DIAGNOSIS — G9389 Other specified disorders of brain: Secondary | ICD-10-CM | POA: Diagnosis not present

## 2020-03-20 LAB — CBC WITH DIFFERENTIAL/PLATELET
Abs Immature Granulocytes: 0.33 10*3/uL — ABNORMAL HIGH (ref 0.00–0.07)
Basophils Absolute: 0.2 10*3/uL — ABNORMAL HIGH (ref 0.0–0.1)
Basophils Relative: 2 %
Eosinophils Absolute: 0.7 10*3/uL — ABNORMAL HIGH (ref 0.0–0.5)
Eosinophils Relative: 6 %
HCT: 41.2 % (ref 39.0–52.0)
Hemoglobin: 14.2 g/dL (ref 13.0–17.0)
Immature Granulocytes: 3 %
Lymphocytes Relative: 16 %
Lymphs Abs: 1.9 10*3/uL (ref 0.7–4.0)
MCH: 30.4 pg (ref 26.0–34.0)
MCHC: 34.5 g/dL (ref 30.0–36.0)
MCV: 88.2 fL (ref 80.0–100.0)
Monocytes Absolute: 1.3 10*3/uL — ABNORMAL HIGH (ref 0.1–1.0)
Monocytes Relative: 12 %
Neutro Abs: 7.1 10*3/uL (ref 1.7–7.7)
Neutrophils Relative %: 61 %
Platelets: 569 10*3/uL — ABNORMAL HIGH (ref 150–400)
RBC: 4.67 MIL/uL (ref 4.22–5.81)
RDW: 13.6 % (ref 11.5–15.5)
WBC: 11.5 10*3/uL — ABNORMAL HIGH (ref 4.0–10.5)
nRBC: 0 % (ref 0.0–0.2)

## 2020-03-20 LAB — COMPREHENSIVE METABOLIC PANEL
ALT: 67 U/L — ABNORMAL HIGH (ref 0–44)
AST: 36 U/L (ref 15–41)
Albumin: 3.3 g/dL — ABNORMAL LOW (ref 3.5–5.0)
Alkaline Phosphatase: 175 U/L — ABNORMAL HIGH (ref 38–126)
Anion gap: 10 (ref 5–15)
BUN: 13 mg/dL (ref 6–20)
CO2: 25 mmol/L (ref 22–32)
Calcium: 9 mg/dL (ref 8.9–10.3)
Chloride: 102 mmol/L (ref 98–111)
Creatinine, Ser: 0.66 mg/dL (ref 0.61–1.24)
GFR, Estimated: >60 mL/min (ref >60)
Glucose, Bld: 110 mg/dL — ABNORMAL HIGH (ref 70–99)
Potassium: 4 mmol/L (ref 3.5–5.1)
Sodium: 137 mmol/L (ref 135–145)
Total Bilirubin: 0.8 mg/dL (ref 0.3–1.2)
Total Protein: 7.2 g/dL (ref 6.5–8.1)

## 2020-03-20 LAB — GLUCOSE, CAPILLARY
Glucose-Capillary: 115 mg/dL — ABNORMAL HIGH (ref 70–99)
Glucose-Capillary: 124 mg/dL — ABNORMAL HIGH (ref 70–99)
Glucose-Capillary: 133 mg/dL — ABNORMAL HIGH (ref 70–99)
Glucose-Capillary: 143 mg/dL — ABNORMAL HIGH (ref 70–99)
Glucose-Capillary: 161 mg/dL — ABNORMAL HIGH (ref 70–99)
Glucose-Capillary: 96 mg/dL (ref 70–99)

## 2020-03-20 LAB — PHOSPHORUS: Phosphorus: 4 mg/dL (ref 2.5–4.6)

## 2020-03-20 LAB — MAGNESIUM: Magnesium: 2.2 mg/dL (ref 1.7–2.4)

## 2020-03-20 MED ORDER — LACTATED RINGERS IV BOLUS
500.0000 mL | Freq: Once | INTRAVENOUS | Status: AC
  Administered 2020-03-20: 500 mL via INTRAVENOUS

## 2020-03-20 NOTE — Progress Notes (Signed)
Pt voice and breathing gurgling upon RN entering room. MD, Lowell Guitar rounded during this change. Rhonchi heard bilaterally. See new orders.   Orders for pt to remain NPO. All beverages and food trays removed from pt bedside table, however due to poor communication regarding NPO status, NT fed pt lunch. Pt ate 100% of his tray, and NT verified that he appeared to do well and had no events during meal. Dr. Lowell Guitar was notified of this.   1430: Pt appears flushed and warm to the touch, oral temperature 97.5 and axillary temperature 99.5 - will continue to monitor. Pt still slightly tachycardic after fluid bolus in the low 100's.   Pt has also been drowsy and pleasant today, whereas the past 2 days he had increased agitation and combativeness at some points.  Robina Ade, RN

## 2020-03-20 NOTE — Progress Notes (Signed)
  Speech Language Pathology Treatment: Dysphagia  Patient Details Name: WEYLYN RICCIUTI MRN: [id number] DOB: Nov 08, 1960 Today's Date: 03/20/2020 Time: 1445-1510 SLP Time Calculation (min) (ACUTE ONLY): 25 min  Assessment / Plan / Recommendation Clinical Impression  MD ordered repeat BSE due to patient having reported difficulties with PO's leading to patient being downgraded to NPO. Patient already on caseload and so dysphagia treatment completed instead of BSE. Patient was pleasantly confused, alert and cooperative. He exhibited minimal amount of throat clearing and appearance of slightly discoordinated/delayed swallow initiation with nectar thick liquids but this seemed to be related to patient attempting to take larger volume liquid sips when SLP holding cup and bringing it to his mouth. When SLP controlled sip and bite size and frequency, patient did not exhibit overt s/s aspiration or penetration. SLP reviewed MBS results with RN which showed delayed oral and pharyngeal transit of purees and nectar thick liquids, but no aspiration or penetration. SLP recommended to restart Dys 1, nectar thick liquids diet and closely monitor his toleration and continue full supervision to reduce patient's impulsivity.   HPI HPI: OMEED OSUNA is a 60 y.o. male with hx of recently diagnosed brain tumor (left parietal lobe) who was transferred from [facility] ED to [facility] 11/25 with AMS and seizures. Underwent resection 12/1. Intubated 12/1-12/15. Had FEES 12/21 with recomendation for NPO with occasional tsp honey thick water. MBS 12/28 revealed improvements with recs for dysphagia 1, nectar thick liquids.      SLP Plan  Continue with current plan of care       Recommendations  Diet recommendations: Dysphagia 1 (puree);Nectar-thick liquid Liquids provided via: Cup Medication Administration: Crushed with puree Supervision: Staff to assist with self feeding;Full supervision/cueing for compensatory  strategies Compensations: Minimize environmental distractions;Slow rate;Small sips/bites;Lingual sweep for clearance of pocketing Postural Changes and/or Swallow Maneuvers: Seated upright 90 degrees                Oral Care Recommendations: Oral care BID;Staff/trained caregiver to provide oral care Follow up Recommendations: Skilled Nursing facility SLP Visit Diagnosis: Dysphagia, oropharyngeal phase (R13.12) Plan: Continue with current plan of care       GO                Angela Nevin, MA, CCC-SLP Speech Therapy [facility]

## 2020-03-20 NOTE — Progress Notes (Signed)
Patient appears less agitated today.  Denies headache.  Somewhat confused but moves his extremities.  Continue current management.

## 2020-03-20 NOTE — Progress Notes (Signed)
PROGRESS NOTE    Antonio Glenn  GBT:[id number] DOB: June 30, 1960 DOA: 02/11/2020 PCP: Leonard Downing, MD   No chief complaint on file.  Brief Narrative: Antonio Glenn is Antonio Glenn 60 yo male with PMH of obesity; admitted on11/25/2021, with complaint of seizures and confusion, was found to have brain mass s/p craniotomy on 12/1//2021. After extubation patient was referred to Hospitalist for medical management. 11/25 admission for seizures 12/1 craniotomy of the left parietal tumor, remains intubated 12/3 core track insertion 12/4 LTM EEG no seizures but epileptogenicity from left temporal region 12/5 prelim pathology report GBM, sent to [facility] for further evaluation, report not available. 12/9 family meeting and conversation with DNR and no intubation. 12/15 extubated to room air 12/17 PCCM signed off, TRH assuming medical care, low-grade fever, resolved on its own.Daughter converted patient back to FULL CODE. 12/19 right upper extremity Doppler shows SVT, no DVT. 12/27: path back consistent with GBM, grade 4. MBSS performed (patient passed for first time and started on dysphagia 1 diet)  Currently further plan is monitor for improvement in mentation. Awaiting discharge to rehab/CIR.   Assessment & Plan:   Active Problems:   Altered mental status   Seizures (HCC)   Encephalopathy acute   Brain mass   New onset Deitrich Steve-fib (HCC)   Respiratory failure (HCC)   HAP (hospital-acquired pneumonia)   HTN (hypertension)   Encounter for imaging study to confirm orogastric (OG) tube placement  Systemic Inflammatory Response Syndrome  Concern for Aspiration Event, Aspiration Pneumonitis vs Pneumonia:  Bilateral rhonchi on exam, sounds like he have had aspiration event Tachy to 110's, sinus on tele, satting well on pulse ox.  WBC elevated to 11.5.  Afebrile. Follow CXR Bolus 500 cc.  Consider abx. Will ask speech for repeat evaluation Aspiration precautions  Acute metabolic encephalopathy -  hallucinating, restless overnight, required ativan and haldol  - patient now likely at new baseline (which is rather poor) - Likely CNS tumor (now GBM by path), polypharmacy - Continue Seroquel and phenobarbital.  Valium bid prn. - will add haldol prn -> follow EKG for follow up qtc - pending, discussed with nursing - 12/19 opioids changed from scheduled to as needed.  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 Poor prognosis and typically average of 2 year survival - given patient consistently failing swallow evaluations, requiring restraints and mittens to not pull out tubes, and severe debility with right-sided neglect, palliative care consultation warranted at this time to at least begin some discussions on how aggressive family would want to be especially if patient needing PEG tube (started on dysphagia 1 diet on 03/15/2020 - follow for PO intake, ability to tolerate -> NPO given above).  Regardless, involving palliative care given underlying GBM is appropriate - Vimpat and phenobarbital for seizures. EEG negative for active seizures.  Acute respiratory failure with hypoxia, POA secondary to encephalopathy. Needed intubation initially. Extubated on 12/15  MRSA HAP - resolved Low-grade fever on 12/17, resolved on its own Prior pneumonia, treated with IV vancomycin. Course completed.  Paroxysmal [place]. Fib Currently rate controlled. Anticoagulation on hold due to CNS mass. Monitor.  Thoracic aortic aneurysm Incidental diagnosis. Repeat CT scan in 6 months.  Goals of care conversation Difficult social situation. Currently patient unable to participate in goals of care conversation. Initially based on the discussion with PCCM and the family patient was DNR; now he has been changed back to  Full code. See GBM above  Appreciate palliative care  recommendations -> pending note with plan for rehab with palliative care to follow outpatient, family has decided on DNR after discussion with palliative  Hypokalemia. -Replete and recheck as needed  Dysphagia. -Has been on tube feeds - ate 75% of breakfast this AM - MBSS on 12/28 (trial of dysphagia 1 diet now) - continue TF until taking in adequate and sufficient diet (need PCM to assist with family discussion if Holliday Sheaffer PEG becomes needed given that patient has required restraints consistently)  Accelerated hypertension. BP borderline, continue to monitor, adjust prn - agitation likely contributes Started on clonidine,will continue. Continue Norvasc As needed hydralazine.  Right upper extremity SVT. No evidence of DVT. No need for anticoagulation. Not [place] candidate for anticoagulation regardless.  Pain control. Now on oxycodone 5-10 q6P; scheduled oxycodone has been discontinued  Low-grade temperature. Intermittent. Likely from SVT vs central fever  X-ray negative  Elevated LFTs Follow, mild  DVT prophylaxis: SCD Code Status: full  Family Communication: none at bedside Disposition:   Status is: Inpatient  Remains inpatient appropriate because:Inpatient level of care appropriate due to severity of illness   Dispo: per primary       Consultants:   Neurosurgery is primary  [facility] consulting   PCCM  Procedures:  12/1 craniotomy  Antimicrobials:  Anti-infectives (From admission, onward)   Start     Dose/Rate Route Frequency Ordered Stop   02/29/20 0200  vancomycin (VANCOREADY) IVPB 1750 mg/350 mL        1,750 mg 175 mL/hr over 120 Minutes Intravenous Every 8 hours 02/28/20 1805 03/02/20 1955   02/25/20 1800  vancomycin (VANCOREADY) IVPB 2000 mg/400 mL  Status:  Discontinued        2,000 mg 200 mL/hr over 120 Minutes Intravenous Every 12 hours 02/25/20 1627 02/28/20 1805   02/17/20 2300  ceFAZolin (ANCEF) IVPB 2g/100 mL premix        2 g 200 mL/hr over  30 Minutes Intravenous Every 8 hours 02/17/20 1918 02/18/20 1507   02/17/20 0500  vancomycin (VANCOREADY) IVPB 1500 mg/300 mL        1,500 mg 150 mL/hr over 120 Minutes Intravenous 120 min pre-op 02/15/20 1325 02/18/20 1128         Subjective: Calmer today, difficult to understand what he's saying Audible junky upper airway sounds, like possible aspiration  Objective: Vitals:   03/19/20 2343 03/20/20 0401 03/20/20 0737 03/20/20 1136  BP: 123/89 (!) 147/80 (!) 146/85 (!) 139/15  Pulse: (!) 106 98 100   Resp: 20 17 20    Temp: 98.4 F (36.9 C) 98.6 F (37 C) 98.1 F (36.7 C) 98.7 F (37.1 C)  TempSrc: Oral Oral Oral Oral  SpO2: 97% 96% 99% 98%  Weight:      Height:       No intake or output data in the 24 hours ending 03/20/20 1154 Filed Weights   03/15/20 0429 03/16/20 0500 03/17/20 0500  Weight: 133.6 kg 133.4 kg 133.8 kg    Examination:  General: NAD Cardiovascular: mild tachycardia, regular, 110 Lungs: audible upper airway sounds, bilateral diffuse rhonchi, clears throat time to time during exam Abdomen: s/nt/nd Neurological: alert, disoriented, moving all extremities, nonsensical speech Extremities: no LEE  Data Reviewed: I have personally reviewed following labs and imaging studies  CBC: Recent Labs  Lab 03/16/20 0302 03/17/20 0318 03/18/20 0406 03/19/20 0408 03/20/20 0110  WBC 10.4 10.4 10.7* 10.4 11.5*  NEUTROABS 5.7 5.8 6.1 6.3 7.1  HGB 13.6 13.6 13.1 13.8 14.2  HCT 41.6 39.8 40.9 40.5 41.2  MCV 90.8 89.8 92.1 89.2 88.2  PLT 564* 554* 546* 536* 569*    Basic Metabolic Panel: Recent Labs  Lab 03/16/20 0302 03/17/20 0318 03/18/20 0406 03/19/20 0142 03/19/20 0830 03/20/20 0110  NA 139 139 138 137  --  137  K 3.9 4.0 3.8 5.6* 4.0 4.0  CL 104 106 103 104  --  102  CO2 25 24 26  20*  --  25  GLUCOSE 108* 121* 117* 83  --  110*  BUN 15 15 14 16   --  13  CREATININE 0.62 0.58* 0.62 0.57*  --  0.66  CALCIUM 8.9 8.8* 8.8* 8.8*  --  9.0  MG 2.1  2.1 2.0 2.1  --  2.2  PHOS  --  3.9 3.7 4.4  --  4.0    GFR: Estimated Creatinine Clearance: 144.7 mL/min (by C-G formula based on SCr of 0.66 mg/dL).  Liver Function Tests: Recent Labs  Lab 03/16/20 0302 03/17/20 0318 03/18/20 0406 03/19/20 0142 03/20/20 0110  AST 28 22 29  48* 36  ALT 70* 57* 59* 60* 67*  ALKPHOS 159* 151* 162* 158* 175*  BILITOT 0.4 0.3 0.5 QUANTITY NOT SUFFICIENT, UNABLE TO PERFORM TEST 0.8  PROT 6.1* 6.1* 6.1* 6.0* 7.2  ALBUMIN 2.7* 2.7* 2.8* 3.0* 3.3*    CBG: Recent Labs  Lab 03/19/20 2022 03/19/20 2338 03/20/20 0342 03/20/20 0814 03/20/20 1136  GLUCAP 133* 124* 133* 124* 143*     Recent Results (from the past 240 hour(s))  MRSA PCR Screening     Status: None   Collection Time: 03/18/20 11:13 AM   Specimen: Nasal Mucosa; Nasopharyngeal  Result Value Ref Range Status   MRSA by PCR NEGATIVE NEGATIVE Final    Comment:        The GeneXpert MRSA Assay (FDA approved for NASAL specimens only), is one component of [facility] comprehensive MRSA colonization surveillance program. It is not intended to diagnose MRSA infection nor to guide or monitor treatment for MRSA infections. Performed at [facility], [place] [address] [place], [place] [address]          Radiology Studies: No results found.      Scheduled Meds: . amLODipine  10 mg Per Tube Daily  . bethanechol  10 mg Per Tube TID  . Chlorhexidine Gluconate Cloth  6 each Topical Daily  . cloNIDine  0.1 mg Per Tube Daily  . docusate  100 mg Per Tube BID  . enoxaparin (LOVENOX) injection  40 mg Subcutaneous Q24H  . feeding supplement  237 mL Oral BID BM  . feeding supplement (OSMOLITE 1.5 CAL)  960 mL Per Tube Q24H  . feeding supplement (PROSource TF)  90 mL Per Tube TID  . insulin aspart  0-15 Units Subcutaneous Q4H  . lacosamide  100 mg Per Tube BID  . pantoprazole sodium  40 mg Per Tube Daily  . PHENObarbital  90 mg Per Tube BID  . QUEtiapine  200 mg Per Tube BID    Continuous Infusions: . sodium chloride Stopped (02/26/20 2221)  . sodium chloride Stopped (03/02/20 1734)  . lactated ringers       LOS: 38 days    Time spent: over 30 min    Fayrene Helper, MD [facility]   To contact the attending provider between 7A-7P or the covering provider during after hours 7P-7A, please log into the web site [email or web address] and access using universal  [place] password for that web site. If you do not have the password, please call the hospital operator.  03/20/2020, 11:54 AM

## 2020-03-20 NOTE — Progress Notes (Signed)
Pt pleasantly confused this am. No agitation, yelling out, or combativeness. Bilateral wrist restraints in place.   Robina Ade, RN

## 2020-03-21 DIAGNOSIS — G9389 Other specified disorders of brain: Secondary | ICD-10-CM | POA: Diagnosis not present

## 2020-03-21 LAB — COMPREHENSIVE METABOLIC PANEL
ALT: 63 U/L — ABNORMAL HIGH (ref 0–44)
AST: 32 U/L (ref 15–41)
Albumin: 3 g/dL — ABNORMAL LOW (ref 3.5–5.0)
Alkaline Phosphatase: 162 U/L — ABNORMAL HIGH (ref 38–126)
Anion gap: 8 (ref 5–15)
BUN: 15 mg/dL (ref 6–20)
CO2: 24 mmol/L (ref 22–32)
Calcium: 8.9 mg/dL (ref 8.9–10.3)
Chloride: 107 mmol/L (ref 98–111)
Creatinine, Ser: 0.6 mg/dL — ABNORMAL LOW (ref 0.61–1.24)
GFR, Estimated: >60 mL/min (ref >60)
Glucose, Bld: 100 mg/dL — ABNORMAL HIGH (ref 70–99)
Potassium: 4.1 mmol/L (ref 3.5–5.1)
Sodium: 139 mmol/L (ref 135–145)
Total Bilirubin: 0.6 mg/dL (ref 0.3–1.2)
Total Protein: 6.5 g/dL (ref 6.5–8.1)

## 2020-03-21 LAB — CBC WITH DIFFERENTIAL/PLATELET
Abs Immature Granulocytes: 0.24 10*3/uL — ABNORMAL HIGH (ref 0.00–0.07)
Basophils Absolute: 0.1 10*3/uL (ref 0.0–0.1)
Basophils Relative: 1 %
Eosinophils Absolute: 0.6 10*3/uL — ABNORMAL HIGH (ref 0.0–0.5)
Eosinophils Relative: 6 %
HCT: 40.4 % (ref 39.0–52.0)
Hemoglobin: 13.3 g/dL (ref 13.0–17.0)
Immature Granulocytes: 2 %
Lymphocytes Relative: 17 %
Lymphs Abs: 1.8 10*3/uL (ref 0.7–4.0)
MCH: 29.6 pg (ref 26.0–34.0)
MCHC: 32.9 g/dL (ref 30.0–36.0)
MCV: 89.8 fL (ref 80.0–100.0)
Monocytes Absolute: 1.3 10*3/uL — ABNORMAL HIGH (ref 0.1–1.0)
Monocytes Relative: 12 %
Neutro Abs: 6.5 10*3/uL (ref 1.7–7.7)
Neutrophils Relative %: 62 %
Platelets: 519 10*3/uL — ABNORMAL HIGH (ref 150–400)
RBC: 4.5 MIL/uL (ref 4.22–5.81)
RDW: 13.6 % (ref 11.5–15.5)
WBC: 10.5 10*3/uL (ref 4.0–10.5)
nRBC: 0 % (ref 0.0–0.2)

## 2020-03-21 LAB — GLUCOSE, CAPILLARY
Glucose-Capillary: 108 mg/dL — ABNORMAL HIGH (ref 70–99)
Glucose-Capillary: 111 mg/dL — ABNORMAL HIGH (ref 70–99)
Glucose-Capillary: 128 mg/dL — ABNORMAL HIGH (ref 70–99)
Glucose-Capillary: 128 mg/dL — ABNORMAL HIGH (ref 70–99)
Glucose-Capillary: 134 mg/dL — ABNORMAL HIGH (ref 70–99)
Glucose-Capillary: 82 mg/dL (ref 70–99)

## 2020-03-21 LAB — PHOSPHORUS: Phosphorus: 3.8 mg/dL (ref 2.5–4.6)

## 2020-03-21 LAB — MAGNESIUM: Magnesium: 2.1 mg/dL (ref 1.7–2.4)

## 2020-03-21 NOTE — Progress Notes (Signed)
Physical Therapy Treatment Patient Details Name: Antonio Glenn MRN: [id number] DOB: 03-Sep-1960 Today's Date: 03/21/2020    History of Present Illness Antonio Glenn is a 60 y.o. male with hx of recently diagnosed brain tumor who was transferred from [facility] ED to [facility] 11/25 with AMS and seizures.CRANIOTOMY OF  LEFT PARIETAL TUMOR 12/1 and on vent, trach off vent 12/15    PT Comments    Pt restless upon arrival to room due to boredom and desire to mobilize, much more pleasant and following commands with repeated cuing during session. Pt tolerated multiple sit to stands throughout session, and progressed to short distance gait training although unable to progress far from chair due to pt impulsivity and unsteadiness. Pt attending to R when given multimodal cues this day. PT continuing to recommend post-acute rehab, will continue to follow while acute.    Follow Up Recommendations  SNF     Equipment Recommendations  Other (comment) (defer to post acute)    Recommendations for Other Services       Precautions / Restrictions Precautions Precautions: Fall Precaution Comments: bilat wrist restraints, NG, R inattention Restrictions Weight Bearing Restrictions: No    Mobility  Bed Mobility Overal bed mobility: Needs Assistance Bed Mobility: Supine to Sit;Sit to Supine     Supine to sit: Mod assist;+2 for physical assistance Sit to supine: Mod assist;+2 for physical assistance   General bed mobility comments: Mod +2 for trunk and LE management, scooting to and from EOB, sequencing task. Increased time to perform.  Transfers Overall transfer level: Needs assistance Equipment used: Rolling walker (2 wheeled);2 person hand held assist Transfers: Sit to/from Stand Sit to Stand: Mod assist;+2 physical assistance Stand pivot transfers: Mod assist;+2 physical assistance       General transfer comment: Mod +2 for initial power up, rise, and steady. Verbal cuing for both power up and  slow eccentric lower back into chair. STS x4 (EOB, recliner x3) during session.  Ambulation/Gait Ambulation/Gait assistance: Mod assist;+2 physical assistance Gait Distance (Feet): 2 Feet Assistive device: 2 person hand held assist Gait Pattern/deviations: Decreased stride length;Step-through pattern;Staggering right;Staggering left;Narrow base of support Gait velocity: decr   General Gait Details: Mod +2 for steadying, weight shifting, correcting truncal hyperextension in stnading.   Stairs             Wheelchair Mobility    Modified Rankin (Stroke Patients Only) Modified Rankin (Stroke Patients Only) Pre-Morbid Rankin Score: No symptoms Modified Rankin: Moderately severe disability     Balance Overall balance assessment: Needs assistance Sitting-balance support: Feet supported;No upper extremity supported Sitting balance-Leahy Scale: Fair     Standing balance support: Bilateral upper extremity supported;During functional activity Standing balance-Leahy Scale: Poor Standing balance comment: reliant on external support                            Cognition Arousal/Alertness: Awake/alert Behavior During Therapy: Restless;Impulsive Overall Cognitive Status: Impaired/Different from baseline Area of Impairment: Orientation;Attention;Memory;Following commands;Safety/judgement;Awareness;Problem solving                 Orientation Level: Disoriented to;Time;Situation Current Attention Level: Focused Memory: Decreased recall of precautions;Decreased short-term memory Following Commands: Follows one step commands with increased time;Follows one step commands inconsistently Safety/Judgement: Decreased awareness of safety;Decreased awareness of deficits Awareness: Intellectual Problem Solving: Slow processing;Decreased initiation;Difficulty sequencing;Requires verbal cues;Requires tactile cues General Comments: Pt initially restless and irritable, especially  during orientation questions. Once mobilizing, pt much more agreeable  and pleasant. Requires short, frequent, and repeated cues to for safe mobility.      Exercises General Exercises - Lower Extremity Hip Flexion/Marching: AROM;Both;10 reps;Seated;Standing    General Comments General comments (skin integrity, edema, etc.): VSS      Pertinent Vitals/Pain Pain Assessment: Faces Faces Pain Scale: No hurt Pain Intervention(s): Limited activity within patient's tolerance;Monitored during session;Repositioned    Home Living                      Prior Function            PT Goals (current goals can now be found in the care plan section) Acute Rehab PT Goals Patient Stated Goal: back to work PT Goal Formulation: With patient Time For Goal Achievement: 04/04/20 Potential to Achieve Goals: Fair Progress towards PT goals: Progressing toward goals    Frequency    Min 3X/week      PT Plan Current plan remains appropriate    Co-evaluation PT/OT/SLP Co-Evaluation/Treatment: Yes Reason for Co-Treatment: For patient/therapist safety;To address functional/ADL transfers;Complexity of the patient's impairments (multi-system involvement);Necessary to address cognition/behavior during functional activity PT goals addressed during session: Mobility/safety with mobility;Balance;Strengthening/ROM        AM-PAC PT "6 Clicks" Mobility   Outcome Measure  Help needed turning from your back to your side while in a flat bed without using bedrails?: A Lot Help needed moving from lying on your back to sitting on the side of a flat bed without using bedrails?: A Lot Help needed moving to and from a bed to a chair (including a wheelchair)?: A Lot Help needed standing up from a chair using your arms (e.g., wheelchair or bedside chair)?: A Lot Help needed to walk in hospital room?: A Lot Help needed climbing 3-5 steps with a railing? : Total 6 Click Score: 11    End of Session  Equipment Utilized During Treatment: Gait belt Activity Tolerance: Patient tolerated treatment well Patient left: in bed;with call bell/phone within reach;with bed alarm set;with restraints reapplied Nurse Communication: Mobility status PT Visit Diagnosis: Unsteadiness on feet (R26.81);Other abnormalities of gait and mobility (R26.89);Difficulty in walking, not elsewhere classified (R26.2);Other symptoms and signs involving the nervous system (R29.898) Hemiplegia - Right/Left: Right Hemiplegia - dominant/non-dominant: Dominant Hemiplegia - caused by: Unspecified     Time: 6195-0932 PT Time Calculation (min) (ACUTE ONLY): 40 min  Charges:  $Therapeutic Activity: 8-22 mins $Neuromuscular Re-education: 8-22 mins                     Marye Round, PT Acute Rehabilitation Services Pager [phone]  Office [phone]    Truddie Coco 03/21/2020, 9:37 AM

## 2020-03-21 NOTE — Progress Notes (Addendum)
PROGRESS NOTE    Antonio Glenn  LEX:[id number] DOB: 06-Sep-1960 DOA: 02/11/2020 PCP: Leonard Downing, MD   No chief complaint on file.  Brief Narrative: Antonio Glenn is a 60 yo male with PMH of obesity; admitted on11/25/2021, with complaint of seizures and confusion, was found to have brain mass s/p craniotomy on 12/1//2021. After extubation patient was referred to Hospitalist for medical management. 11/25 admission for seizures 12/1 craniotomy of the left parietal tumor, remains intubated 12/3 core track insertion 12/4 LTM EEG no seizures but epileptogenicity from left temporal region 12/5 prelim pathology report GBM, sent to [facility] for further evaluation, report not available. 12/9 family meeting and conversation with DNR and no intubation. 12/15 extubated to room air 12/17 PCCM signed off, TRH assuming medical care, low-grade fever, resolved on its own.Daughter converted patient back to FULL CODE. 12/19 right upper extremity Doppler shows SVT, no DVT. 12/27: path back consistent with GBM, grade 4. MBSS performed (patient passed for first time and started on dysphagia 1 diet)  Currently further plan is monitor for improvement in mentation. Awaiting discharge to rehab/CIR.   Assessment & Plan:   Active Problems:   Altered mental status   Seizures (HCC)   Encephalopathy acute   Brain mass   New onset a-fib (HCC)   Respiratory failure (HCC)   HAP (hospital-acquired pneumonia)   HTN (hypertension)   Encounter for imaging study to confirm orogastric (OG) tube placement  Systemic Inflammatory Response Syndrome: CXR with unchanged RLL atelectasis Afebrile  Concern for aspiration with upper airway secretions -> SLP eval recommending dysphagia 1, nectar thick Aspiration precautions Resolved  Acute metabolic encephalopathy - continues in restraints today - patient now likely at new baseline (which is rather poor) - Likely CNS tumor (now GBM by path), polypharmacy -  Continue Seroquel and phenobarbital.  Valium bid prn. - will add haldol prn -> follow EKG for follow up qtc - 445 - 12/19 opioids changed from scheduled to as needed.  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 Poor prognosis and typically average of 2 year survival - given patient consistently failing swallow evaluations, requiring restraints and mittens to not pull out tubes, and severe debility with right-sided neglect, palliative care consultation warranted at this time to at least begin some discussions on how aggressive family would want to be especially if patient needing PEG tube (started on dysphagia 1 diet on 03/15/2020 - follow for PO intake, ability to tolerate).  Regardless, involving palliative care given underlying GBM is appropriate - Vimpat and phenobarbital for seizures. EEG negative for active seizures.  Acute respiratory failure with hypoxia, POA secondary to encephalopathy. Needed intubation initially. Extubated on 12/15  MRSA HAP - resolved Low-grade fever on 12/17, resolved on its own Prior pneumonia, treated with IV vancomycin. Course completed.  Paroxysmal A. Fib Currently rate controlled. Anticoagulation on hold due to CNS mass. Monitor.  Thoracic aortic aneurysm Incidental diagnosis. Repeat CT scan in 6 months.  Goals of care conversation Difficult social situation. Currently patient unable to participate in goals of care conversation. Initially based on the discussion with PCCM and the family patient was DNR; now he has been changed back to Full code. See GBM above  Appreciate palliative care recommendations -> pending note with plan for rehab with palliative care to follow outpatient, family has decided on DNR after discussion with palliative  Hypokalemia. -Replete and recheck as needed  Dysphagia. -  Has been on tube  feeds - ate 75% of breakfast this AM - MBSS on 12/28 (trial of dysphagia 1 diet now) - continue TF until taking in adequate and sufficient diet (need PCM to assist with family discussion if a PEG becomes needed given that patient has required restraints consistently)  Accelerated hypertension. BP borderline, continue to monitor, adjust prn - agitation likely contributes Started on clonidine,will continue. Continue Norvasc As needed hydralazine.  Right upper extremity SVT. No evidence of DVT. No need for anticoagulation. Not a candidate for anticoagulation regardless.  Pain control. Now on oxycodone 5-10 q6P; scheduled oxycodone has been discontinued  Low-grade temperature. Intermittent. Likely from SVT vs central fever  X-ray negative  Elevated LFTs Follow, mild  DVT prophylaxis: SCD, lovenox Code Status: full  Family Communication: none at bedside Disposition:   Status is: Inpatient  Remains inpatient appropriate because:Inpatient level of care appropriate due to severity of illness   Dispo: per primary       Consultants:   Neurosurgery is primary  [facility] consulting   PCCM  Procedures:  12/1 craniotomy  Antimicrobials:  Anti-infectives (From admission, onward)   Start     Dose/Rate Route Frequency Ordered Stop   02/29/20 0200  vancomycin (VANCOREADY) IVPB 1750 mg/350 mL        1,750 mg 175 mL/hr over 120 Minutes Intravenous Every 8 hours 02/28/20 1805 03/02/20 1955   02/25/20 1800  vancomycin (VANCOREADY) IVPB 2000 mg/400 mL  Status:  Discontinued        2,000 mg 200 mL/hr over 120 Minutes Intravenous Every 12 hours 02/25/20 1627 02/28/20 1805   02/17/20 2300  ceFAZolin (ANCEF) IVPB 2g/100 mL premix        2 g 200 mL/hr over 30 Minutes Intravenous Every 8 hours 02/17/20 1918 02/18/20 1507   02/17/20 0500  vancomycin (VANCOREADY) IVPB 1500 mg/300 mL        1,500 mg 150 mL/hr over 120 Minutes Intravenous 120 min pre-op 02/15/20 1325 02/18/20 1128          Subjective: No new complaints confused  Objective: Vitals:   03/21/20 0800 03/21/20 1000 03/21/20 1156 03/21/20 1519  BP: (!) 144/89 117/85 98/84 (!) 146/93  Pulse: 92  (!) 105 88  Resp: 18 20 15 17   Temp: 98.6 F (37 C)  98.3 F (36.8 C) 98.7 F (37.1 C)  TempSrc: Axillary  Axillary Axillary  SpO2: 99% 99% 97%   Weight:      Height:        Intake/Output Summary (Last 24 hours) at 03/21/2020 1751 Last data filed at 03/21/2020 1200 Gross per 24 hour  Intake 1400 ml  Output 200 ml  Net 1200 ml   Filed Weights   03/16/20 0500 03/17/20 0500 03/21/20 0500  Weight: 133.4 kg 133.8 kg 127.7 kg    Examination:  General: No acute distress. Cardiovascular: Heart sounds show a regular rate, and rhythm.  Lungs:  Improved rhonchi Abdomen: Soft, nontender, nondistended  Neurological: Alert and disoriented, answers simple questions. Moves all extremities 4 . Cranial nerves II through XII grossly intact. Skin: Warm and dry. No rashes or lesions. Extremities: No clubbing or cyanosis. No edema.    Data Reviewed: I have personally reviewed following labs and imaging studies  CBC: Recent Labs  Lab 03/17/20 0318 03/18/20 0406 03/19/20 0408 03/20/20 0110 03/21/20 0125  WBC 10.4 10.7* 10.4 11.5* 10.5  NEUTROABS 5.8 6.1 6.3 7.1 6.5  HGB 13.6 13.1 13.8 14.2 13.3  HCT 39.8 40.9 40.5  41.2 40.4  MCV 89.8 92.1 89.2 88.2 89.8  PLT 554* 546* 536* 569* 519*    Basic Metabolic Panel: Recent Labs  Lab 03/17/20 0318 03/18/20 0406 03/19/20 0142 03/19/20 0830 03/20/20 0110 03/21/20 0125  NA 139 138 137  --  137 139  K 4.0 3.8 5.6* 4.0 4.0 4.1  CL 106 103 104  --  102 107  CO2 24 26 20*  --  25 24  GLUCOSE 121* 117* 83  --  110* 100*  BUN 15 14 16   --  13 15  CREATININE 0.58* 0.62 0.57*  --  0.66 0.60*  CALCIUM 8.8* 8.8* 8.8*  --  9.0 8.9  MG 2.1 2.0 2.1  --  2.2 2.1  PHOS 3.9 3.7 4.4  --  4.0 3.8    GFR: Estimated Creatinine Clearance: 141.4 mL/min (A) (by C-G  formula based on SCr of 0.6 mg/dL (L)).  Liver Function Tests: Recent Labs  Lab 03/17/20 0318 03/18/20 0406 03/19/20 0142 03/20/20 0110 03/21/20 0125  AST 22 29 48* 36 32  ALT 57* 59* 60* 67* 63*  ALKPHOS 151* 162* 158* 175* 162*  BILITOT 0.3 0.5 QUANTITY NOT SUFFICIENT, UNABLE TO PERFORM TEST 0.8 0.6  PROT 6.1* 6.1* 6.0* 7.2 6.5  ALBUMIN 2.7* 2.8* 3.0* 3.3* 3.0*    CBG: Recent Labs  Lab 03/20/20 2343 03/21/20 0342 03/21/20 0807 03/21/20 1159 03/21/20 1516  GLUCAP 161* 134* 108* 128* 111*     Recent Results (from the past 240 hour(s))  MRSA PCR Screening     Status: None   Collection Time: 03/18/20 11:13 AM   Specimen: Nasal Mucosa; Nasopharyngeal  Result Value Ref Range Status   MRSA by PCR NEGATIVE NEGATIVE Final    Comment:        The GeneXpert MRSA Assay (FDA approved for NASAL specimens only), is one component of a comprehensive MRSA colonization surveillance program. It is not intended to diagnose MRSA infection nor to guide or monitor treatment for MRSA infections. Performed at [facility], [place] [address] [place], [place] [address]          Radiology Studies: DG CHEST PORT 1 VIEW  Result Date: 03/20/2020 CLINICAL DATA:  Rhonchi. EXAM: PORTABLE CHEST 1 VIEW COMPARISON:  03/06/2020 FINDINGS: Elevated right hemidiaphragm with right lower lobe atelectasis unchanged. Left lung clear. Negative for heart failure or effusion. IMPRESSION: Right lower lobe atelectasis unchanged.  No new findings. Electronically Signed   By: Franchot Gallo M.D.   On: 03/20/2020 14:00        Scheduled Meds: . amLODipine  10 mg Per Tube Daily  . bethanechol  10 mg Per Tube TID  . Chlorhexidine Gluconate Cloth  6 each Topical Daily  . cloNIDine  0.1 mg Per Tube Daily  . docusate  100 mg Per Tube BID  . enoxaparin (LOVENOX) injection  40 mg Subcutaneous Q24H  . feeding supplement  237 mL Oral BID BM  . feeding supplement (OSMOLITE 1.5 CAL)  960 mL Per Tube Q24H   . feeding supplement (PROSource TF)  90 mL Per Tube TID  . insulin aspart  0-15 Units Subcutaneous Q4H  . lacosamide  100 mg Per Tube BID  . pantoprazole sodium  40 mg Per Tube Daily  . PHENObarbital  90 mg Per Tube BID  . QUEtiapine  200 mg Per Tube BID   Continuous Infusions: . sodium chloride Stopped (02/26/20 2221)  . sodium chloride Stopped (03/02/20 1734)     LOS:  39 days    Time spent: over 30 min    Fayrene Helper, MD [facility]   To contact the attending provider between 7A-7P or the covering provider during after hours 7P-7A, please log into the web site [email or web address] and access using universal [place] password for that web site. If you do not have the password, please call the hospital operator.  03/21/2020, 5:51 PM

## 2020-03-21 NOTE — Progress Notes (Signed)
Occupational Therapy Treatment Patient Details Name: Antonio Glenn MRN: [id number] DOB: [birth year] Today's Date: 03/21/2020    History of present illness Antonio Glenn is a 60 y.o. male with hx of recently diagnosed brain tumor who was transferred from [facility] ED to [facility] 11/25 with AMS and seizures.CRANIOTOMY OF  LEFT PARIETAL TUMOR 12/1 and on vent, trach off vent 12/15   OT comments  Pt continues to progress towards OT goals. He remains with cognitive deficits and impulsivities, initially mildly agitated but as session progressed and as pt able to mobilize agitation levels improved and pt pleasantly confused for remainder of session. Pt tolerating functional transfers OOB to recliner (transfer back to bed end of session given safety concerns with being left up in chair). He requires two person assist for safe completion of sit<>Stand and stand pivot (trialled RW but ultimately using HHA), tolerating multiple sit<>stand during session while recliner placed in front of window for pt to look out at the snow. Pt continues to require up to maxA for ADL at this time. Feel POC remains appropriate at this time. Will continue to follow acutely.    Follow Up Recommendations  SNF;Supervision/Assistance - 24 hour    Equipment Recommendations  Other (comment);3 in 1 bedside commode;Wheelchair cushion (measurements OT);Wheelchair (measurements OT) (TBD)          Precautions / Restrictions Precautions Precautions: Fall Precaution Comments: bilat wrist restraints, NG, R inattention Restrictions Weight Bearing Restrictions: No       Mobility Bed Mobility Overal bed mobility: Needs Assistance Bed Mobility: Supine to Sit;Sit to Supine     Supine to sit: Mod assist;+2 for physical assistance Sit to supine: Mod assist;+2 for physical assistance   General bed mobility comments: Mod +2 for trunk and LE management, scooting to and from EOB, sequencing task. Increased time to  perform.  Transfers Overall transfer level: Needs assistance Equipment used: Rolling walker (2 wheeled);2 person hand held assist Transfers: Sit to/from Stand Sit to Stand: Mod assist;+2 physical assistance Stand pivot transfers: Mod assist;+2 physical assistance       General transfer comment: Mod +2 for initial power up, rise, and steady. Verbal cuing for both power up and slow eccentric lower back into chair. STS x4 (EOB, recliner x3) during session. pt intermittently impulsive to return himself to sitting requiring cues/assist for safety when descening to recliner    Balance Overall balance assessment: Needs assistance Sitting-balance support: Feet supported;No upper extremity supported Sitting balance-Leahy Scale: Fair     Standing balance support: Bilateral upper extremity supported;During functional activity Standing balance-Leahy Scale: Poor Standing balance comment: reliant on external support                           ADL either performed or assessed with clinical judgement   ADL Overall ADL's : Needs assistance/impaired     Grooming: Maximal assistance;Sitting;Wash/dry face               Lower Body Dressing: Maximal assistance;+2 for physical assistance;+2 for safety/equipment;Sit to/from stand Lower Body Dressing Details (indicate cue type and reason): assist to don socks at bed level today             Functional mobility during ADLs: Moderate assistance;+2 for physical assistance;+2 for safety/equipment (sit<>stand and stand pivot)                         Cognition Arousal/Alertness: Awake/alert Behavior During Therapy:  Restless;Impulsive Overall Cognitive Status: Impaired/Different from baseline Area of Impairment: Orientation;Attention;Memory;Following commands;Safety/judgement;Awareness;Problem solving                 Orientation Level: Disoriented to;Time;Situation Current Attention Level: Focused Memory: Decreased  recall of precautions;Decreased short-term memory Following Commands: Follows one step commands with increased time;Follows one step commands inconsistently Safety/Judgement: Decreased awareness of safety;Decreased awareness of deficits Awareness: Intellectual Problem Solving: Slow processing;Decreased initiation;Difficulty sequencing;Requires verbal cues;Requires tactile cues General Comments: Pt initially restless and irritable, especially during orientation questions. Once mobilizing, pt much more agreeable and pleasant. Requires short, frequent, and repeated cues to for safe mobility.        Exercises General Exercises - Lower Extremity Hip Flexion/Marching: AROM;Both;10 reps;Seated;Standing   Shoulder Instructions       General Comments VSS on RA    Pertinent Vitals/ Pain       Pain Assessment: Faces Faces Pain Scale: No hurt Pain Intervention(s): Monitored during session  Home Living                                          Prior Functioning/Environment              Frequency  Min 2X/week        Progress Toward Goals  OT Goals(current goals can now be found in the care plan section)  Progress towards OT goals: Progressing toward goals  Acute Rehab OT Goals Patient Stated Goal: back to work OT Goal Formulation: With patient Time For Goal Achievement: 03/18/20 Potential to Achieve Goals: Good  Plan Discharge plan remains appropriate    Co-evaluation    PT/OT/SLP Co-Evaluation/Treatment: Yes Reason for Co-Treatment: For patient/therapist safety;Necessary to address cognition/behavior during functional activity;To address functional/ADL transfers PT goals addressed during session: Mobility/safety with mobility;Balance;Strengthening/ROM OT goals addressed during session: ADL's and self-care      AM-PAC OT "6 Clicks" Daily Activity     Outcome Measure   Help from another person eating meals?: Total Help from another person taking care  of personal grooming?: A Lot Help from another person toileting, which includes using toliet, bedpan, or urinal?: Total Help from another person bathing (including washing, rinsing, drying)?: A Lot Help from another person to put on and taking off regular upper body clothing?: A Lot Help from another person to put on and taking off regular lower body clothing?: A Lot 6 Click Score: 10    End of Session Equipment Utilized During Treatment: Gait belt  OT Visit Diagnosis: Unsteadiness on feet (R26.81);Other abnormalities of gait and mobility (R26.89);Muscle weakness (generalized) (M62.81);Other symptoms and signs involving cognitive function;Low vision, both eyes (H54.2);Cognitive communication deficit (R41.841)   Activity Tolerance Patient tolerated treatment well   Patient Left in bed;with call bell/phone within reach;with bed alarm set;with restraints reapplied   Nurse Communication Mobility status        Time: 6948-5462 OT Time Calculation (min): 39 min  Charges: OT General Charges $OT Visit: 1 Visit OT Treatments $Self Care/Home Management : 8-22 mins  Marcy Siren, OT Acute Rehabilitation Services Pager [phone] Office [phone]    Orlando Penner 03/21/2020, 10:27 AM

## 2020-03-21 NOTE — Plan of Care (Signed)

## 2020-03-21 NOTE — Plan of Care (Signed)
  Problem: Education: Goal: Knowledge of General Education information will improve Description: Including pain rating scale, medication(s)/side effects and non-pharmacologic comfort measures 03/21/2020 2053 by Nonie Hoyer, RN Outcome: Progressing 03/21/2020 2053 by Nonie Hoyer, RN Outcome: Progressing   Problem: Health Behavior/Discharge Planning: Goal: Ability to manage health-related needs will improve 03/21/2020 2053 by Nonie Hoyer, RN Outcome: Progressing 03/21/2020 2053 by Nonie Hoyer, RN Outcome: Progressing   Problem: Clinical Measurements: Goal: Ability to maintain clinical measurements within normal limits will improve 03/21/2020 2053 by Nonie Hoyer, RN Outcome: Progressing 03/21/2020 2053 by Nonie Hoyer, RN Outcome: Progressing Goal: Will remain free from infection 03/21/2020 2053 by Nonie Hoyer, RN Outcome: Progressing 03/21/2020 2053 by Nonie Hoyer, RN Outcome: Progressing Goal: Diagnostic test results will improve 03/21/2020 2053 by Nonie Hoyer, RN Outcome: Progressing 03/21/2020 2053 by Nonie Hoyer, RN Outcome: Progressing Goal: Respiratory complications will improve 03/21/2020 2053 by Nonie Hoyer, RN Outcome: Progressing 03/21/2020 2053 by Nonie Hoyer, RN Outcome: Progressing Goal: Cardiovascular complication will be avoided 03/21/2020 2053 by Nonie Hoyer, RN Outcome: Progressing 03/21/2020 2053 by Nonie Hoyer, RN Outcome: Progressing

## 2020-03-21 NOTE — Progress Notes (Signed)
Subjective: NAEs o/n  Objective: Vital signs in last 24 hours: Temp:  [97.8 F (36.6 C)-98.6 F (37 C)] 98.3 F (36.8 C) (01/03 1156) Pulse Rate:  [92-105] 105 (01/03 1156) Resp:  [15-20] 15 (01/03 1156) BP: (98-144)/(84-89) 98/84 (01/03 1156) SpO2:  [97 %-99 %] 97 % (01/03 1156) Weight:  [127.7 kg] 127.7 kg (01/03 0500)  Intake/Output from previous day: 01/02 0701 - 01/03 0700 In: 980 [NG/GT:980] Out: 200 [Urine:200] Intake/Output this shift: Total I/O In: 420 [P.O.:120; NG/GT:300] Out: -   Alert, conversant.  However, easily confused Oriented to person, January, but not year. FC x 4 Incision c/d  Lab Results: Recent Labs    03/20/20 0110 03/21/20 0125  WBC 11.5* 10.5  HGB 14.2 13.3  HCT 41.2 40.4  PLT 569* 519*   BMET Recent Labs    03/20/20 0110 03/21/20 0125  NA 137 139  K 4.0 4.1  CL 102 107  CO2 25 24  GLUCOSE 110* 100*  BUN 13 15  CREATININE 0.66 0.60*  CALCIUM 9.0 8.9    Studies/Results: DG CHEST PORT 1 VIEW  Result Date: 03/20/2020 CLINICAL DATA:  Rhonchi. EXAM: PORTABLE CHEST 1 VIEW COMPARISON:  03/06/2020 FINDINGS: Elevated right hemidiaphragm with right lower lobe atelectasis unchanged. Left lung clear. Negative for heart failure or effusion. IMPRESSION: Right lower lobe atelectasis unchanged.  No new findings. Electronically Signed   By: Franchot Gallo M.D.   On: 03/20/2020 14:00    Assessment/Plan: S/p L parietal GBM resection - awaiting SNF placement   Antonio Glenn 03/21/2020, 12:26 PM

## 2020-03-21 NOTE — Progress Notes (Signed)
  Speech Language Pathology Treatment: Dysphagia;Cognitive-Linquistic  Patient Details Name: Antonio Glenn MRN: [id number] DOB: June 28, 1960 Today's Date: 03/21/2020 Time: 5189-8421 SLP Time Calculation (min) (ACUTE ONLY): 12 min  Assessment / Plan / Recommendation Clinical Impression  Mr Rocks was awake and not as alert as usual. He does have mild pharyngeal congestion with intermittent throat clears prior to nectar and pudding sips/bites. He self fed with left hand given assist to manipulate container and spoon and therapist monitoring amount. No indications of airway compromise. Cued him for volitional coughs and use [place] to extract mucous. With pt holding calendar, attempts to read included unrelated and random words. Could not state number 3 given phrase completion cues. His responses in more automatic, social context are more accurate. He comprehended therapist's comment related to snow outside and looked out window. Pt needs continued therapy for dysphagia and aphasia to function in his environment with least assist possible.    HPI HPI: Antonio Glenn is a 60 y.o. male with hx of recently diagnosed brain tumor (left parietal lobe) who was transferred from [facility] ED to [facility] 11/25 with AMS and seizures. Underwent resection 12/1. Intubated 12/1-12/15. Had FEES 12/21 with recomendation for NPO with occasional tsp honey thick water. MBS 12/28 revealed improvements with recs for dysphagia 1, nectar thick liquids.      SLP Plan  Continue with current plan of care       Recommendations  Diet recommendations: Dysphagia 1 (puree);Nectar-thick liquid Liquids provided via: Cup;Straw Medication Administration: Crushed with puree Supervision: Staff to assist with self feeding;Full supervision/cueing for compensatory strategies Compensations: Slow rate;Small sips/bites;Minimize environmental distractions Postural Changes and/or Swallow Maneuvers: Seated upright 90 degrees                 Oral Care Recommendations: Oral care BID Follow up Recommendations: Skilled Nursing facility SLP Visit Diagnosis: Dysphagia, oropharyngeal phase (R13.12);Aphasia (R47.01);Cognitive communication deficit (I31.281) Plan: Continue with current plan of care       GO                Royce Macadamia 03/21/2020, 1:54 PM

## 2020-03-21 NOTE — Progress Notes (Signed)
[facility] Community Based Palliative Care       This patient has been referred to our palliative care services in the community.  [facility] will continue to follow for any discharge planning needs and to coordinate admission onto palliative care.    If you have questions or need assistance, please call [phone] or contact the hospital Liaison listed on AMION.     Thank you for the opportunity to participate in this patient's care.     Chrislyn King, BSN, RN [facility] Hospital Liaison [phone] (24h on call)   

## 2020-03-22 DIAGNOSIS — R451 Restlessness and agitation: Secondary | ICD-10-CM

## 2020-03-22 DIAGNOSIS — Z515 Encounter for palliative care: Secondary | ICD-10-CM | POA: Diagnosis not present

## 2020-03-22 DIAGNOSIS — G9389 Other specified disorders of brain: Secondary | ICD-10-CM | POA: Diagnosis not present

## 2020-03-22 LAB — COMPREHENSIVE METABOLIC PANEL
ALT: 62 U/L — ABNORMAL HIGH (ref 0–44)
AST: 32 U/L (ref 15–41)
Albumin: 2.9 g/dL — ABNORMAL LOW (ref 3.5–5.0)
Alkaline Phosphatase: 161 U/L — ABNORMAL HIGH (ref 38–126)
Anion gap: 8 (ref 5–15)
BUN: 14 mg/dL (ref 6–20)
CO2: 26 mmol/L (ref 22–32)
Calcium: 8.6 mg/dL — ABNORMAL LOW (ref 8.9–10.3)
Chloride: 104 mmol/L (ref 98–111)
Creatinine, Ser: 0.63 mg/dL (ref 0.61–1.24)
GFR, Estimated: >60 mL/min (ref >60)
Glucose, Bld: 135 mg/dL — ABNORMAL HIGH (ref 70–99)
Potassium: 3.9 mmol/L (ref 3.5–5.1)
Sodium: 138 mmol/L (ref 135–145)
Total Bilirubin: 0.8 mg/dL (ref 0.3–1.2)
Total Protein: 6.4 g/dL — ABNORMAL LOW (ref 6.5–8.1)

## 2020-03-22 LAB — CBC WITH DIFFERENTIAL/PLATELET
Abs Immature Granulocytes: 0.2 10*3/uL — ABNORMAL HIGH (ref 0.00–0.07)
Basophils Absolute: 0.1 10*3/uL (ref 0.0–0.1)
Basophils Relative: 1 %
Eosinophils Absolute: 0.7 10*3/uL — ABNORMAL HIGH (ref 0.0–0.5)
Eosinophils Relative: 7 %
HCT: 42.9 % (ref 39.0–52.0)
Hemoglobin: 14.2 g/dL (ref 13.0–17.0)
Immature Granulocytes: 2 %
Lymphocytes Relative: 15 %
Lymphs Abs: 1.6 10*3/uL (ref 0.7–4.0)
MCH: 30 pg (ref 26.0–34.0)
MCHC: 33.1 g/dL (ref 30.0–36.0)
MCV: 90.7 fL (ref 80.0–100.0)
Monocytes Absolute: 1.4 10*3/uL — ABNORMAL HIGH (ref 0.1–1.0)
Monocytes Relative: 13 %
Neutro Abs: 6.9 10*3/uL (ref 1.7–7.7)
Neutrophils Relative %: 62 %
Platelets: 495 10*3/uL — ABNORMAL HIGH (ref 150–400)
RBC: 4.73 MIL/uL (ref 4.22–5.81)
RDW: 13.7 % (ref 11.5–15.5)
WBC: 10.9 10*3/uL — ABNORMAL HIGH (ref 4.0–10.5)
nRBC: 0 % (ref 0.0–0.2)

## 2020-03-22 LAB — GLUCOSE, CAPILLARY
Glucose-Capillary: 122 mg/dL — ABNORMAL HIGH (ref 70–99)
Glucose-Capillary: 115 mg/dL — ABNORMAL HIGH (ref 70–99)
Glucose-Capillary: 128 mg/dL — ABNORMAL HIGH (ref 70–99)
Glucose-Capillary: 130 mg/dL — ABNORMAL HIGH (ref 70–99)

## 2020-03-22 LAB — PHOSPHORUS: Phosphorus: 4.2 mg/dL (ref 2.5–4.6)

## 2020-03-22 LAB — MAGNESIUM: Magnesium: 2 mg/dL (ref 1.7–2.4)

## 2020-03-22 NOTE — Progress Notes (Signed)
PROGRESS NOTE    Antonio Glenn  JQZ:[id number] DOB: November 08, 1960 DOA: 02/11/2020 PCP: Antonio Downing, MD   No chief complaint on file.  Brief Narrative: Antonio Glenn is Antonio Glenn 60 yo male with PMH of obesity; admitted on11/25/2021, with complaint of seizures and confusion, was found to have brain mass s/p craniotomy on 12/1//2021. After extubation patient was referred to Hospitalist for medical management. 11/25 admission for seizures 12/1 craniotomy of the left parietal tumor, remains intubated 12/3 core track insertion 12/4 LTM EEG no seizures but epileptogenicity from left temporal region 12/5 prelim pathology report GBM, sent to [facility] for further evaluation, report not available. 12/9 family meeting and conversation with DNR and no intubation. 12/15 extubated to room air 12/17 PCCM signed off, TRH assuming medical care, low-grade fever, resolved on its own.Daughter converted patient back to FULL CODE. 12/19 right upper extremity Doppler shows SVT, no DVT. 12/27: path back consistent with GBM, grade 4. MBSS performed (patient passed for first time and started on dysphagia 1 diet) 1/4: discontinue small bore feeding tube  Currently further plan is monitor for improvement in mentation. Awaiting discharge to rehab/CIR.   Assessment & Plan:   Active Problems:   Altered mental status   Seizures (HCC)   Encephalopathy acute   Brain mass   New onset Antonio Glenn-fib (HCC)   Respiratory failure (HCC)   HAP (hospital-acquired pneumonia)   HTN (hypertension)   Encounter for imaging study to confirm orogastric (OG) tube placement  Systemic Inflammatory Response Syndrome: CXR with unchanged RLL atelectasis Afebrile  Concern for aspiration with upper airway secretions -> SLP eval recommending dysphagia 1, nectar thick Aspiration precautions Resolved  Acute metabolic encephalopathy - continues in restraints today -> will try to wean off of these - patient now likely at new baseline (which  is rather poor) - Likely CNS tumor (now GBM by path), polypharmacy - Continue Seroquel and phenobarbital.  Valium bid prn.  Haldol 41m q6 prn - 12/19 opioids changed from scheduled to as needed.  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 Poor prognosis and typically average of 2 year survival - given patient consistently failing swallow evaluations, requiring restraints and mittens to not pull out tubes, and severe debility with right-sided neglect, palliative care consultation warranted  - appreciate palliative care recommendations - Vimpat and phenobarbital for seizures. EEG negative for active seizures.  Dysphagia. -Has been on tube feeds - ate 50% of breakfast this AM -> per discussion with neurosurgrey, will try to discontinue NG tube and allow him to eat (hopefully this will help stimulate his appetite and remove deliriogenic NG tube which he was trying to pull this AM) - MBSS on 12/28 (trial of dysphagia 1 diet now) - appreciate palliative care recommendations  Acute respiratory failure with hypoxia, POA secondary to encephalopathy. Needed intubation initially. Extubated on 12/15  MRSA HAP - resolved Low-grade fever on 12/17, resolved on its own Prior pneumonia, treated with IV vancomycin. Course completed.  Paroxysmal Antonio Glenn. Fib Currently rate controlled. Anticoagulation on hold due to CNS mass. Monitor.  Thoracic aortic aneurysm Incidental diagnosis. Repeat CT scan in 6 months.  Goals of care conversation Difficult social situation. Currently patient unable to participate in goals of care conversation. Initially based on the discussion with PCCM and the family patient was DNR; now he has been changed back to Full code. See GBM above  Appreciate palliative care recommendations -> pending note  with plan for rehab with palliative care to follow  outpatient, family has decided on DNR after discussion with palliative  Hypokalemia. -Replete and recheck as needed  Accelerated hypertension. BP fluctuating -> appropriate today continue to monitor, adjust prn - agitation likely contributes Started on clonidine,will continue. Continue Norvasc As needed hydralazine.  Right upper extremity SVT. No evidence of DVT. No need for anticoagulation. Not Antonio Glenn candidate for anticoagulation regardless.  Pain control. Now on oxycodone 5-10 q6P; scheduled oxycodone has been discontinued  Low-grade temperature. Intermittent. Likely from SVT vs central fever  X-ray negative  Elevated LFTs Follow, mild  DVT prophylaxis: SCD, lovenox Code Status: full  Family Communication: none at bedside Disposition:   Status is: Inpatient  Remains inpatient appropriate because:Inpatient level of care appropriate due to severity of illness   Dispo: per primary       Consultants:   Neurosurgery is primary  [facility] consulting   PCCM  Procedures:  12/1 craniotomy  Antimicrobials:  Anti-infectives (From admission, onward)   Start     Dose/Rate Route Frequency Ordered Stop   02/29/20 0200  vancomycin (VANCOREADY) IVPB 1750 mg/350 mL        1,750 mg 175 mL/hr over 120 Minutes Intravenous Every 8 hours 02/28/20 1805 03/02/20 1955   02/25/20 1800  vancomycin (VANCOREADY) IVPB 2000 mg/400 mL  Status:  Discontinued        2,000 mg 200 mL/hr over 120 Minutes Intravenous Every 12 hours 02/25/20 1627 02/28/20 1805   02/17/20 2300  ceFAZolin (ANCEF) IVPB 2g/100 mL premix        2 g 200 mL/hr over 30 Minutes Intravenous Every 8 hours 02/17/20 1918 02/18/20 1507   02/17/20 0500  vancomycin (VANCOREADY) IVPB 1500 mg/300 mL        1,500 mg 150 mL/hr over 120 Minutes Intravenous 120 min pre-op 02/15/20 1325 02/18/20 1128         Subjective: Confused, no new complaints No pain   Objective: Vitals:   03/21/20 2310 03/22/20 0323 03/22/20  0738 03/22/20 1153  BP: 113/83 125/84 139/87 116/86  Pulse: 93 82 97 99  Resp: _0 Temp: 99.4 F (37.4 C) 98.1 F (36.7 C) 98.5 F (36.9 C) 97.9 F (36.6 C)  TempSrc: Axillary Axillary Oral Oral  SpO2: 98% 99% 100% 99%  Weight:      Height:        Intake/Output Summary (Last 24 hours) at 03/22/2020 1543 Last data filed at 03/22/2020 0930 Gross per 24 hour  Intake 300 ml  Output 225 ml  Net 75 ml   Filed Weights   03/16/20 0500 03/17/20 0500 03/21/20 0500  Weight: 133.4 kg 133.8 kg 127.7 kg    Examination:  General: No acute distress. Cardiovascular: Heart sounds show Mazen Marcin regular rate, and rhythm.  Lungs: Clear to auscultation bilaterally  Abdomen: Soft, nontender, nondistended.  NGtube. Neurological: Alert and disoriented. Moves all extremities 4, currently in restraints, trying to pull NG when I walk into room.  Skin: Warm and dry. No rashes or lesions. Extremities: No clubbing or cyanosis. No edema.     Data Reviewed: I have personally reviewed following labs and imaging studies  CBC: Recent Labs  Lab 03/18/20 0406 03/19/20 0408 03/20/20 0110 03/21/20 0125 03/22/20 0342  WBC 10.7* 10.4 11.5* 10.5 10.9*  NEUTROABS 6.1 6.3 7.1 6.5 6.9  HGB 13.1 13.8 14.2 13.3 14.2  HCT 40.9 40.5 41.2 40.4 42.9  MCV 92.1 89.2 88.2 89.8 90.7  PLT 546* 536*  569* 519* 495*    Basic Metabolic Panel: Recent Labs  Lab 03/18/20 0406 03/19/20 0142 03/19/20 0830 03/20/20 0110 03/21/20 0125 03/22/20 0342  NA 138 137  --  137 139 138  K 3.8 5.6* 4.0 4.0 4.1 3.9  CL 103 104  --  102 107 104  CO2 26 20*  --  _0 GLUCOSE 117* 83  --  110* 100* 135*  BUN 14 16  --  _1 CREATININE 0.62 0.57*  --  0.66 0.60* 0.63  CALCIUM 8.8* 8.8*  --  9.0 8.9 8.6*  MG 2.0 2.1  --  2.2 2.1 2.0  PHOS 3.7 4.4  --  4.0 3.8 4.2    GFR: Estimated Creatinine Clearance: 141.4 mL/min (by C-G formula based on SCr of 0.63 mg/dL).  Liver Function Tests: Recent Labs  Lab  03/18/20 0406 03/19/20 0142 03/20/20 0110 03/21/20 0125 03/22/20 0342  AST 29 48* 36 32 32  ALT 59* 60* 67* 63* 62*  ALKPHOS 162* 158* 175* 162* 161*  BILITOT 0.5 QUANTITY NOT SUFFICIENT, UNABLE TO PERFORM TEST 0.8 0.6 0.8  PROT 6.1* 6.0* 7.2 6.5 6.4*  ALBUMIN 2.8* 3.0* 3.3* 3.0* 2.9*    CBG: Recent Labs  Lab 03/21/20 2012 03/21/20 2356 03/22/20 0354 03/22/20 0736 03/22/20 1154  GLUCAP 82 128* 122* 115* 128*     Recent Results (from the past 240 hour(s))  MRSA PCR Screening     Status: None   Collection Time: 03/18/20 11:13 AM   Specimen: Nasal Mucosa; Nasopharyngeal  Result Value Ref Range Status   MRSA by PCR NEGATIVE NEGATIVE Final    Comment:        The GeneXpert MRSA Assay (FDA approved for NASAL specimens only), is one component of Hardie Veltre comprehensive MRSA colonization surveillance program. It is not intended to diagnose MRSA infection nor to guide or monitor treatment for MRSA infections. Performed at [facility], [place] [address] [place], [place] [address]          Radiology Studies: No results found.      Scheduled Meds: . amLODipine  10 mg Per Tube Daily  . bethanechol  10 mg Per Tube TID  . Chlorhexidine Gluconate Cloth  6 each Topical Daily  . cloNIDine  0.1 mg Per Tube Daily  . docusate  100 mg Per Tube BID  . enoxaparin (LOVENOX) injection  40 mg Subcutaneous Q24H  . feeding supplement  237 mL Oral BID BM  . feeding supplement (OSMOLITE 1.5 CAL)  960 mL Per Tube Q24H  . feeding supplement (PROSource TF)  90 mL Per Tube TID  . insulin aspart  0-15 Units Subcutaneous Q4H  . lacosamide  100 mg Per Tube BID  . pantoprazole sodium  40 mg Per Tube Daily  . PHENObarbital  90 mg Per Tube BID  . QUEtiapine  200 mg Per Tube BID   Continuous Infusions: . sodium chloride Stopped (02/26/20 2221)  . sodium chloride Stopped (03/02/20 1734)     LOS: 40 days    Time spent: over 36 min    Fayrene Helper, MD [facility]   To contact the attending provider between 7A-7P or the covering provider during after hours 7P-7A, please log into the web site [email or web address] and access using universal [place] password for that web site. If you do not have the password, please call the hospital operator.  03/22/2020, 3:43 PM

## 2020-03-22 NOTE — Plan of Care (Signed)

## 2020-03-22 NOTE — Progress Notes (Addendum)
Daily Progress Note   Patient Name: Antonio Glenn       Date: 03/22/2020 DOB: 27-Aug-1960  Age: 60 y.o. MRN#: [id number] Attending Physician: [name], MD Primary Care Physician: Leonard Downing, MD Admit Date: 02/11/2020  Reason for Consultation/Follow-up:  To discuss complex medical decision making related to patient's goals of care  Subjective: Visited with patient and RN tech at bedside as she is trying to feed him a D1 diet. He is much more interested in chatting with the two of [place] rather than eating.  His emotions seem to swing from almost crying one moment to being very happy the next.  He has expressive aphasia using incorrect words in attempting to express himself.  Overall he seems very happy and friendly.  Discussed with Dr. Florene Glen.  Wonder if tube feeds are keeping him from being hungry?  Perhaps he would eat more without the tube feeds and without the cor trak.  Dr. Florene Glen and Dr. Marcello Moores had already discussed the same.  I called patient's daughter Antonio Glenn.  Talked with her about taking out the cor trak, attempting to remove the restraints and watchful waiting to see if he eats well and feels better.  Antonio Glenn was happy that we are progressing him and that he may come out of restraints but she offered a word of caution.  Just a day or two ago he was very determined to get up and leave the hospital .  Antonio Glenn felt some type of restraint may still be necessary.   Assessment:  Emotionally labile gentleman s/p GBM resection.  Now with a significantly lower level of cognition and ability to express himself, but seems generally in good spirits.  Did not seem interested in eating D1 diet.   Patient Profile/HPI:  60 y.o.malewith past medical history of  obesityadmitted on 11/25/2021with seizures and confusion.Found to have brain mass s/p craniotomy 12/1. Pathology consistent with GBM, grade 4. Patient has required core track - now tolerating some PO intake. Also with agitation and requiring restraints. PMT consulted to discuss [place].    Length of Stay: 40   Vital Signs: BP 116/86 (BP Location: Right Arm)   Pulse 99   Temp 97.9 F (36.6 C) (Oral)   Resp 20   Ht 6\' 3"  (1.905 m)   Wt 127.7 kg  SpO2 99%   BMI 35.19 kg/m  SpO2: SpO2: 99 % O2 Device: O2 Device: Room Air O2 Flow Rate: O2 Flow Rate (L/min): 4 L/min       Palliative Assessment/Data: 50%     Palliative Care Plan    Recommendations/Plan:  D/C tube feeds.  Strict Is and Os.  Need to track PO intake to determine if he will support his own nutritional needs.  Remove Cor Trak and remove restraints as appropriate.  Continue feeding assistance and supervision.  Patient will likely need a bed side sitter when out of restraints.  Appreciate [facility] Palliative follow up outpatient.  PMT will follow along to see how he does this admission - particularly with PO intake.  Code Status:  DNR  Prognosis:   Unable to determine.  Per literature avg is less than 2 years.   Discharge Planning:  [place] for rehab with Palliative care service follow-up  Care plan was discussed with Daughter and Dr. Marcelline Deist  Thank you for allowing the Palliative Medicine Team to assist in the care of this patient.  Total time spent:  35 min.     Greater than 50%  of this time was spent counseling and coordinating care related to the above assessment and plan.  Florentina Jenny, PA-C Palliative Medicine  Please contact Palliative MedicineTeam phone at [phone] for questions and concerns between 7 am - 7 pm.   Please see AMION for individual provider pager numbers.

## 2020-03-22 NOTE — TOC Progression Note (Signed)
Transition of Care [facility]) - Progression Note    Patient Details  Name: Antonio Glenn MRN: [id number] Date of Birth: [birth year]  Transition of [facility]) CM/SW Contact  Eduard Roux, [place] Phone Number: 03/22/2020, 11:46 AM  Clinical Narrative:     CSW continues to follow for medical readiness.  [name], MSW, LCSW Clinical Social Worker   Expected Discharge Plan: Skilled Nursing Facility Barriers to Discharge: SNF Pending bed offer  Expected Discharge Plan and Services Expected Discharge Plan: Skilled Nursing Facility   Discharge Planning Services: CM Consult Post Acute Care Choice: Skilled Nursing Facility Living arrangements for the past 2 months: Single Family Home                                       Social Determinants of Health (SDOH) Interventions    Readmission Risk Interventions No flowsheet data found.

## 2020-03-22 NOTE — Progress Notes (Signed)
Subjective: NAEs o/n  Objective: Vital signs in last 24 hours: Temp:  [97.9 F (36.6 C)-99.4 F (37.4 C)] 97.9 F (36.6 C) (01/04 1153) Pulse Rate:  [82-99] 99 (01/04 1153) Resp:  [13-20] 20 (01/04 1153) BP: (113-146)/(83-93) 116/86 (01/04 1153) SpO2:  [96 %-100 %] 99 % (01/04 1153)  Intake/Output from previous day: 01/03 0701 - 01/04 0700 In: 420 [P.O.:120; NG/GT:300] Out: 225 [Urine:225] Intake/Output this shift: No intake/output data recorded.  Awake, alert, oriented to person only.  Speech fluent but meandering and occasionally nonsensical.  Follows commands, MAEs. Incision c/d. Restrained.  Lab Results: Recent Labs    03/21/20 0125 03/22/20 0342  WBC 10.5 10.9*  HGB 13.3 14.2  HCT 40.4 42.9  PLT 519* 495*   BMET Recent Labs    03/21/20 0125 03/22/20 0342  NA 139 138  K 4.1 3.9  CL 107 104  CO2 24 26  GLUCOSE 100* 135*  BUN 15 14  CREATININE 0.60* 0.63  CALCIUM 8.9 8.6*    Studies/Results: DG CHEST PORT 1 VIEW  Result Date: 03/20/2020 CLINICAL DATA:  Rhon[place]. EXAM: PORTABLE CHEST 1 VIEW COMPARISON:  03/06/2020 FINDINGS: Elevated right hemidiaphragm with right lower lobe atelectasis unchanged. Left lung clear. Negative for heart failure or effusion. IMPRESSION: Right lower lobe atelectasis unchanged.  No new findings. Electronically Signed   By: Franchot Gallo M.D.   On: 03/20/2020 14:00    Assessment/Plan: S/p L parietal crani for GBM - d/c DHT, will wean off restraints - awaiting SNF   [name] 03/22/2020, 12:27 PM

## 2020-03-23 DIAGNOSIS — G9389 Other specified disorders of brain: Secondary | ICD-10-CM | POA: Diagnosis not present

## 2020-03-23 DIAGNOSIS — G9340 Encephalopathy, unspecified: Secondary | ICD-10-CM | POA: Diagnosis not present

## 2020-03-23 DIAGNOSIS — R4182 Altered mental status, unspecified: Secondary | ICD-10-CM | POA: Diagnosis not present

## 2020-03-23 DIAGNOSIS — J9600 Acute respiratory failure, unspecified whether with hypoxia or hypercapnia: Secondary | ICD-10-CM | POA: Diagnosis not present

## 2020-03-23 LAB — GLUCOSE, CAPILLARY
Glucose-Capillary: 106 mg/dL — ABNORMAL HIGH (ref 70–99)
Glucose-Capillary: 108 mg/dL — ABNORMAL HIGH (ref 70–99)
Glucose-Capillary: 111 mg/dL — ABNORMAL HIGH (ref 70–99)
Glucose-Capillary: 119 mg/dL — ABNORMAL HIGH (ref 70–99)
Glucose-Capillary: 85 mg/dL (ref 70–99)
Glucose-Capillary: 95 mg/dL (ref 70–99)
Glucose-Capillary: 96 mg/dL (ref 70–99)
Glucose-Capillary: 98 mg/dL (ref 70–99)

## 2020-03-23 LAB — COMPREHENSIVE METABOLIC PANEL
ALT: 58 U/L — ABNORMAL HIGH (ref 0–44)
AST: 29 U/L (ref 15–41)
Albumin: 2.9 g/dL — ABNORMAL LOW (ref 3.5–5.0)
Alkaline Phosphatase: 160 U/L — ABNORMAL HIGH (ref 38–126)
Anion gap: 9 (ref 5–15)
BUN: 15 mg/dL (ref 6–20)
CO2: 25 mmol/L (ref 22–32)
Calcium: 8.7 mg/dL — ABNORMAL LOW (ref 8.9–10.3)
Chloride: 105 mmol/L (ref 98–111)
Creatinine, Ser: 0.64 mg/dL (ref 0.61–1.24)
GFR, Estimated: >60 mL/min (ref >60)
Glucose, Bld: 103 mg/dL — ABNORMAL HIGH (ref 70–99)
Potassium: 3.8 mmol/L (ref 3.5–5.1)
Sodium: 139 mmol/L (ref 135–145)
Total Bilirubin: 0.9 mg/dL (ref 0.3–1.2)
Total Protein: 6.3 g/dL — ABNORMAL LOW (ref 6.5–8.1)

## 2020-03-23 LAB — CBC WITH DIFFERENTIAL/PLATELET
Abs Immature Granulocytes: 0.18 10*3/uL — ABNORMAL HIGH (ref 0.00–0.07)
Basophils Absolute: 0.1 10*3/uL (ref 0.0–0.1)
Basophils Relative: 1 %
Eosinophils Absolute: 0.7 10*3/uL — ABNORMAL HIGH (ref 0.0–0.5)
Eosinophils Relative: 8 %
HCT: 41.8 % (ref 39.0–52.0)
Hemoglobin: 14.3 g/dL (ref 13.0–17.0)
Immature Granulocytes: 2 %
Lymphocytes Relative: 18 %
Lymphs Abs: 1.6 10*3/uL (ref 0.7–4.0)
MCH: 31 pg (ref 26.0–34.0)
MCHC: 34.2 g/dL (ref 30.0–36.0)
MCV: 90.5 fL (ref 80.0–100.0)
Monocytes Absolute: 1.2 10*3/uL — ABNORMAL HIGH (ref 0.1–1.0)
Monocytes Relative: 14 %
Neutro Abs: 5 10*3/uL (ref 1.7–7.7)
Neutrophils Relative %: 57 %
Platelets: 462 10*3/uL — ABNORMAL HIGH (ref 150–400)
RBC: 4.62 MIL/uL (ref 4.22–5.81)
RDW: 13.8 % (ref 11.5–15.5)
WBC: 8.7 10*3/uL (ref 4.0–10.5)
nRBC: 0 % (ref 0.0–0.2)

## 2020-03-23 LAB — MAGNESIUM: Magnesium: 2 mg/dL (ref 1.7–2.4)

## 2020-03-23 LAB — PHOSPHORUS: Phosphorus: 3.9 mg/dL (ref 2.5–4.6)

## 2020-03-23 NOTE — TOC Progression Note (Addendum)
Transition of Care [facility]) - Progression Note    Patient Details  Name: Antonio Glenn MRN: [id number] Date of Birth: Mar 06, 1961  [facility] CM/SW Contact  Eduard Roux, [place] Phone Number: 03/23/2020, 11:01 AM  Clinical Narrative:     [facility]. Patient only other bed offer is [facility]. Family states [facility].   CSW re-sent SNF referrals since the patient has clinically improved.  CSW will provide additional bed offers if/once available CSW has updated patient's daughter and Gunnar Fusi.  CSW will continue to follow and assist with discharge planning.  [name], MSW, LCSW Clinical Social Worker     [name], MSW, LCSW Clinical Social Worker   Expected Discharge Plan: Skilled Nursing Facility Barriers to Discharge: SNF Pending bed offer  Expected Discharge Plan and Services Expected Discharge Plan: Skilled Nursing Facility   Discharge Planning Services: CM Consult Post Acute Care Choice: Skilled Nursing Facility Living arrangements for the past 2 months: Single Family Home                                       Social Determinants of Health (SDOH) Interventions    Readmission Risk Interventions No flowsheet data found.

## 2020-03-23 NOTE — Progress Notes (Signed)
Occupational Therapy Treatment Patient Details Name: Antonio Glenn MRN: [id number] DOB: 1960-12-12 Today's Date: 03/23/2020    History of present illness Antonio Glenn is a 60 y.o. male with hx of recently diagnosed brain tumor who was transferred from [facility] ED to [facility] 11/25 with AMS and seizures.CRANIOTOMY OF  LEFT PARIETAL TUMOR 12/1 and on vent, trach off vent 12/15   OT comments  Pt progressed to bathroom level grooming task this session with RW. Pt needs (A) for sustaining R UE on RW during session. Pt allowed time to run into objects on R side with RW and self corrected the RW by looking to the R. Recommendation for SNF at this time.    Follow Up Recommendations  SNF;Supervision/Assistance - 24 hour    Equipment Recommendations  Other (comment);3 in 1 bedside commode;Wheelchair cushion (measurements OT);Wheelchair (measurements OT)    Recommendations for Other Services      Precautions / Restrictions Precautions Precautions: Fall Precaution Comments: R inattention mittens       Mobility Bed Mobility Overal bed mobility: Needs Assistance Bed Mobility: Supine to Sit     Supine to sit: Min guard Sit to supine: Min guard   General bed mobility comments: pt using R bed rail to elevate trunk in long sitting and then pivot to eob without (A)  Transfers Overall transfer level: Needs assistance Equipment used: Rolling walker (2 wheeled) Transfers: Sit to/from Stand Sit to Stand: Mod assist;+2 physical assistance;From elevated surface Stand pivot transfers: Mod assist;+2 physical assistance       General transfer comment: Mod +2 for power up, rise and steady. STS x3, from EOB x2 and from chair in bathroom x1.    Balance Overall balance assessment: Needs assistance Sitting-balance support: Feet supported;No upper extremity supported Sitting balance-Leahy Scale: Fair     Standing balance support: Bilateral upper extremity supported;During functional  activity Standing balance-Leahy Scale: Poor Standing balance comment: reliant on external support                           ADL either performed or assessed with clinical judgement   ADL Overall ADL's : Needs assistance/impaired     Grooming: Moderate assistance;Sitting Grooming Details (indicate cue type and reason): pt shaving face but becomes agitated when Ot prevents him from shaving off the side of his hair. pt was allowed to freely shave facial hair as he wished it to look. pt with neglect of R side of face and OT helping even out shave.             Lower Body Dressing: Minimal assistance;Sitting/lateral leans Lower Body Dressing Details (indicate cue type and reason): sitting eob and able to hip hick at EOB . increased time and effort to don R sock Toilet Transfer: Regular Toilet;RW;+2 for physical assistance;Moderate assistance Toilet Transfer Details (indicate cue type and reason): cues for safety and positioning. pt not voiding and standing 12 inches away from base of toilet         Functional mobility during ADLs: Moderate assistance;+2 for physical assistance;+2 for safety/equipment General ADL Comments: pt visually attending to face in the mirror to shave. pt turning head and tilting     Vision       Perception     Praxis      Cognition Arousal/Alertness: Awake/alert Behavior During Therapy: Restless (irritable)   Area of Impairment: Orientation;Attention;Memory;Following commands;Safety/judgement;Awareness;Problem solving  Orientation Level: Disoriented to;Time;Situation Current Attention Level: Sustained (focuses on ADL tasks x2 minutes) Memory: Decreased short-term memory Following Commands: Follows one step commands with increased time;Follows one step commands inconsistently Safety/Judgement: Decreased awareness of safety;Decreased awareness of deficits Awareness: Intellectual Problem Solving: Slow  processing;Decreased initiation;Difficulty sequencing;Requires verbal cues;Requires tactile cues General Comments: Pt oriented to self and [facility] today, requires visual and verbal cues for orientation to time and unable to state back when prompted <1 minute later. Pt is restless, and at times irritable especially when PT and OT assisting pt with tasks or requesting pt wear his mask. Pt with periods of aphasic speech. pt unable to read information to help with recall        Exercises Other Exercises Other Exercises: adl task setup to encourage R side and R visual attention   Shoulder Instructions       General Comments VSS pt states "what you want me to do " and then engages in all request from therapist    Pertinent Vitals/ Pain       Pain Assessment: Faces Faces Pain Scale: Hurts a little bit Pain Location: generalized, during ADL tasks Pain Descriptors / Indicators: Grimacing Pain Intervention(s): Limited activity within patient's tolerance;Monitored during session;Repositioned  Home Living                                          Prior Functioning/Environment              Frequency  Min 2X/week        Progress Toward Goals  OT Goals(current goals can now be found in the care plan section)  Progress towards OT goals: Progressing toward goals  Acute Rehab OT Goals Patient Stated Goal: back to work OT Goal Formulation: With patient Time For Goal Achievement: 04/06/20 Potential to Achieve Goals: Good ADL Goals Pt Will Perform Grooming: with min guard assist;sitting Pt Will Perform Upper Body Bathing: with min assist;sitting Pt Will Perform Lower Body Bathing: sit to/from stand;with min assist Pt Will Perform Upper Body Dressing: with min assist;sitting Pt Will Perform Lower Body Dressing: with min assist;sit to/from stand Pt Will Transfer to Toilet: with mod assist;ambulating Pt Will Perform Toileting - Clothing Manipulation and hygiene: with  mod assist;sit to/from stand Pt/caregiver will Perform Home Exercise Program: Increased ROM;Increased strength;Right Upper extremity;With written HEP provided Additional ADL Goal #2: Pt will demonstrate ability to identify 3/5 objects when presented to him with no more than min cues.  Plan Discharge plan remains appropriate    Co-evaluation    PT/OT/SLP Co-Evaluation/Treatment: Yes Reason for Co-Treatment: For patient/therapist safety;To address functional/ADL transfers PT goals addressed during session: Mobility/safety with mobility;Balance;Proper use of DME OT goals addressed during session: ADL's and self-care;Proper use of Adaptive equipment and DME;Strengthening/ROM      AM-PAC OT "6 Clicks" Daily Activity     Outcome Measure   Help from another person eating meals?: Total Help from another person taking care of personal grooming?: A Lot Help from another person toileting, which includes using toliet, bedpan, or urinal?: Total Help from another person bathing (including washing, rinsing, drying)?: A Lot Help from another person to put on and taking off regular upper body clothing?: A Lot Help from another person to put on and taking off regular lower body clothing?: A Lot 6 Click Score: 10    End of Session Equipment Utilized During Treatment: Gait belt  OT Visit Diagnosis: Unsteadiness on feet (R26.81);Other abnormalities of gait and mobility (R26.89);Muscle weakness (generalized) (M62.81);Other symptoms and signs involving cognitive function;Low vision, both eyes (H54.2);Cognitive communication deficit (R41.841)   Activity Tolerance Patient tolerated treatment well   Patient Left in bed;with call bell/phone within reach;with bed alarm set;with restraints reapplied   Nurse Communication Mobility status        Time: 4720-7218 OT Time Calculation (min): 37 min  Charges: OT General Charges $OT Visit: 1 Visit OT Treatments $Self Care/Home Management : 8-22  mins   Brynn, OTR/L  Acute Rehabilitation Services Pager: [phone] Office: [phone] .    Mateo Flow 03/23/2020, 2:43 PM

## 2020-03-23 NOTE — Progress Notes (Signed)
Noted patient is eating 50 - 100% of his meals.  He is out of restraints.  Plan to DC to SNF.    In the DC instructions please state "Palliative Care to follow at SNF".  Palliative Medicine will sign off.  Please do not hesitate to call our office if we can be of assistance to [name].  Norvel Richards, PA-C Palliative Medicine Office:  [phone]   No charge note.

## 2020-03-23 NOTE — Progress Notes (Signed)
Subjective: Ambulated with PT  Objective: Vital signs in last 24 hours: Temp:  [98.3 F (36.8 C)-100 F (37.8 C)] 98.9 F (37.2 C) (01/05 1200) Pulse Rate:  [94] 94 (01/05 1200) Resp:  [12-20] 18 (01/05 1200) BP: (129-150)/(83-99) 136/91 (01/05 1200) SpO2:  [97 %-98 %] 98 % (01/05 1200)  Intake/Output from previous day: 01/04 0701 - 01/05 0700 In: 540 [P.O.:540] Out: 600 [Urine:600] Intake/Output this shift: No intake/output data recorded.  Awake, alert, oriented only to person.  Confused speech. FC x 4 Incision c/d  Lab Results: Recent Labs    03/22/20 0342 03/23/20 0228  WBC 10.9* 8.7  HGB 14.2 14.3  HCT 42.9 41.8  PLT 495* 462*   BMET Recent Labs    03/22/20 0342 03/23/20 0228  NA 138 139  K 3.9 3.8  CL 104 105  CO2 26 25  GLUCOSE 135* 103*  BUN 14 15  CREATININE 0.63 0.64  CALCIUM 8.6* 8.7*    Studies/Results: No results found.  Assessment/Plan: L parietal lobe GBM s/p resection - awaiting SNF placement  Antonio Glenn 03/23/2020, 1:50 PM

## 2020-03-23 NOTE — Progress Notes (Signed)
PROGRESS NOTE    Antonio Glenn  CHE:[id number] DOB: 1960/08/10 DOA: 02/11/2020 PCP: Leonard Downing, MD   No chief complaint on file.  Brief Narrative: Antonio Glenn is a 60 yo male with PMH of obesity; admitted on11/25/2021, with complaint of seizures and confusion, was found to have brain mass s/p craniotomy on 12/1//2021 of which biopsy showed Grade 4 GBM.  Hospitalist consulted for medical management.  Palliative on board, patient currently a DNR due to very poor prognosis.  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 Poor prognosis and typically average of 2 year survival EEG negative for active seizures Vimpat and phenobarbital for seizures ppx Palliative care on board EEG negative for active seizures.  Dysphagia SLP on board, now recommend dysphagia type I, nectar thick liquids Strict aspiration precautions  Acute respiratory failure with hypoxia, POA secondary to encephalopathy Currently on room air Needed intubation initially, extubated on 12/15  MRSA HAP Resolved Low-grade fever on 12/17, resolved on its own Prior pneumonia, treated with IV vancomycin. Course completed.  Paroxysmal A. Fib Currently rate controlled. Anticoagulation on hold due to CNS mass  Thoracic aortic aneurysm Incidental diagnosis. Repeat  CT scan in 6 months  Hypertension Uncontrolled, likely worsened by agitation Continue clonidine, Norvasc, as needed hydralazine  Right upper extremity SVT No evidence of DVT No need for anticoagulation, also not a candidate  Obesity  Goals of care discussion Patient with poor prognosis, encephalopathic (possibly new baseline) Palliative consult appreciated, family decided to switch back to DNR Awaiting placement at SNF        DVT prophylaxis: SCD, lovenox Code Status: DNR Family Communication: none at bedside Disposition:   Status is: Inpatient  Remains inpatient appropriate because:Inpatient level of care appropriate due to severity of illness   Dispo: per primary       Consultants:   Neurosurgery is primary  [facility] consulting   PCCM  Procedures:  12/1 craniotomy  Antimicrobials:  Anti-infectives (From admission, onward)   Start     Dose/Rate Route Frequency Ordered Stop   02/29/20 0200  vancomycin (VANCOREADY) IVPB 1750 mg/350 mL        1,750 mg 175 mL/hr over 120 Minutes Intravenous Every 8 hours 02/28/20 1805 03/02/20 1955   02/25/20 1800  vancomycin (VANCOREADY) IVPB 2000 mg/400 mL  Status:  Discontinued        2,000 mg 200 mL/hr over 120 Minutes Intravenous Every 12 hours 02/25/20 1627 02/28/20 1805   02/17/20 2300  ceFAZolin (ANCEF) IVPB 2g/100 mL premix        2 g 200 mL/hr over 30 Minutes Intravenous Every 8 hours 02/17/20 1918 02/18/20 1507   02/17/20 0500  vancomycin (VANCOREADY) IVPB 1500 mg/300 mL        1,500 mg 150 mL/hr over 120 Minutes Intravenous 120 min pre-op 02/15/20 1325 02/18/20 1128         Subjective: Disoriented, confused,  was able to tell me his name and date of birth  Objective: Vitals:   03/22/20 2346 03/23/20 0434 03/23/20 0810 03/23/20 1200  BP: (!) 147/88 137/83 (!) 150/99 (!) 136/91  Pulse:    94  Resp: 12 20 17 18   Temp: 98.7 F (37.1 C) 100 F (37.8 C) 99.4 F (37.4 C) 98.9 F (37.2 C)  TempSrc:  Oral Axillary Axillary Axillary  SpO2:   97% 98%  Weight:      Height:        Intake/Output Summary (Last 24 hours) at 03/23/2020 1446 Last data filed at 03/23/2020 0444 Gross per 24 hour  Intake 120 ml  Output 600 ml  Net -480 ml   Filed Weights   03/16/20 0500 03/17/20 0500 03/21/20 0500  Weight: 133.4 kg 133.8 kg 127.7 kg    Examination:  General: NAD, restless, intermittently talking jargon  Cardiovascular: S1, S2 present  Respiratory: CTAB  Abdomen: Soft, nontender, nondistended, bowel sounds present  Musculoskeletal: No bilateral pedal edema noted  Skin: Normal  Psychiatry:  Disoriented, restless     Data Reviewed: I have personally reviewed following labs and imaging studies  CBC: Recent Labs  Lab 03/19/20 0408 03/20/20 0110 03/21/20 0125 03/22/20 0342 03/23/20 0228  WBC 10.4 11.5* 10.5 10.9* 8.7  NEUTROABS 6.3 7.1 6.5 6.9 5.0  HGB 13.8 14.2 13.3 14.2 14.3  HCT 40.5 41.2 40.4 42.9 41.8  MCV 89.2 88.2 89.8 90.7 90.5  PLT 536* 569* 519* 495* 462*    Basic Metabolic Panel: Recent Labs  Lab 03/19/20 0142 03/19/20 0830 03/20/20 0110 03/21/20 0125 03/22/20 0342 03/23/20 0228  NA 137  --  137 139 138 139  K 5.6* 4.0 4.0 4.1 3.9 3.8  CL 104  --  102 107 104 105  CO2 20*  --  25 24 26 25   GLUCOSE 83  --  110* 100* 135* 103*  BUN 16  --  13 15 14 15   CREATININE 0.57*  --  0.66 0.60* 0.63 0.64  CALCIUM 8.8*  --  9.0 8.9 8.6* 8.7*  MG 2.1  --  2.2 2.1 2.0 2.0  PHOS 4.4  --  4.0 3.8 4.2 3.9    GFR: Estimated Creatinine Clearance: 141.4 mL/min (by C-G formula based on SCr of 0.64 mg/dL).  Liver Function Tests: Recent Labs  Lab 03/19/20 0142 03/20/20 0110 03/21/20 0125 03/22/20 0342 03/23/20 0228  AST 48* 36 32 32 29  ALT 60* 67* 63* 62* 58*  ALKPHOS 158* 175* 162* 161* 160*  BILITOT QUANTITY NOT SUFFICIENT, UNABLE TO PERFORM TEST 0.8 0.6 0.8 0.9  PROT 6.0* 7.2 6.5 6.4* 6.3*  ALBUMIN 3.0* 3.3* 3.0* 2.9* 2.9*    CBG: Recent Labs  Lab  03/22/20 2002 03/23/20 0035 03/23/20 0437 03/23/20 0807 03/23/20 1157  GLUCAP 130* 85 98 96 111*     Recent Results (from the past 240 hour(s))  MRSA PCR Screening     Status: None   Collection Time: 03/18/20 11:13 AM   Specimen: Nasal Mucosa; Nasopharyngeal  Result Value Ref Range Status   MRSA by PCR NEGATIVE NEGATIVE Final    Comment:        The GeneXpert MRSA Assay (FDA approved for NASAL specimens only), is one component of a comprehensive MRSA colonization surveillance program. It is not intended to diagnose MRSA infection nor to guide or monitor treatment for MRSA infections. Performed at [facility], [place] [address], [place], [place] [address]  Radiology Studies: No results found.      Scheduled Meds: . amLODipine  10 mg Per Tube Daily  . bethanechol  10 mg Per Tube TID  . Chlorhexidine Gluconate Cloth  6 each Topical Daily  . cloNIDine  0.1 mg Per Tube Daily  . docusate  100 mg Per Tube BID  . enoxaparin (LOVENOX) injection  40 mg Subcutaneous Q24H  . feeding supplement  237 mL Oral BID BM  . insulin aspart  0-15 Units Subcutaneous Q4H  . lacosamide  100 mg Per Tube BID  . pantoprazole sodium  40 mg Per Tube Daily  . PHENObarbital  90 mg Per Tube BID  . QUEtiapine  200 mg Per Tube BID   Continuous Infusions: . sodium chloride Stopped (02/26/20 2221)  . sodium chloride Stopped (03/02/20 1734)     LOS: 41 days      Alma Friendly, MD [facility]  03/23/2020, 2:46 PM

## 2020-03-23 NOTE — Progress Notes (Addendum)
Patient forcefully and verbally refusing to wear his telemetry wires. Patient is calm otherwise and wishes to rest without being "tied down". This RN is attempting to refrain from irrigating the patient unnecessarily. Patient is pending transfer to SNF facility tomorrow 03/24/20. NP Ouma paged.  Update 2255 NP Ouma notified and no new orders.

## 2020-03-23 NOTE — Progress Notes (Signed)
Physical Therapy Treatment Patient Details Name: Antonio Glenn MRN: [id number] DOB: 1960/10/31 Today's Date: 03/23/2020    History of Present Illness Antonio Glenn is a 60 y.o. male with hx of recently diagnosed brain tumor who was transferred from [facility] ED to [facility] 11/25 with AMS and seizures.CRANIOTOMY OF  LEFT PARIETAL TUMOR 12/1 and on vent, trach off vent 12/15    PT Comments    Pt progressing well with mobility today, ambulating short hallway distance with RW and mod +2 assist. Pt with marked R inattention during gait with RW, requiring PT and OT assist to guide RW, place R hand, and steady pt throughout mobility. Pt with improving activity tolerance, will continue to follow acutely.     Follow Up Recommendations  SNF     Equipment Recommendations  Other (comment) (defer to post acute)    Recommendations for Other Services       Precautions / Restrictions Precautions Precautions: Fall Precaution Comments: mitts, R inattention    Mobility  Bed Mobility Overal bed mobility: Needs Assistance Bed Mobility: Supine to Sit;Sit to Supine     Supine to sit: Min guard Sit to supine: Min guard   General bed mobility comments: Min guard for safety, increased time to perform.  Transfers Overall transfer level: Needs assistance Equipment used: Rolling walker (2 wheeled) Transfers: Sit to/from Stand Sit to Stand: Mod assist;+2 physical assistance;From elevated surface         General transfer comment: Mod +2 for power up, rise and steady. STS x3, from EOB x2 and from chair in bathroom x1.  Ambulation/Gait Ambulation/Gait assistance: Mod assist;+2 physical assistance;+2 safety/equipment Gait Distance (Feet): 55 Feet (+20) Assistive device: Rolling walker (2 wheeled) Gait Pattern/deviations: Step-through pattern;Decreased stride length;Trunk flexed;Staggering right;Drifts right/left Gait velocity: decr   General Gait Details: Mod +2 for steadying, guiding RW and  correcting pt trajectory, re-centering pt in RW, placing RUE back on RW multiple times due to inattention, correcting balance.   Stairs             Wheelchair Mobility    Modified Rankin (Stroke Patients Only) Modified Rankin (Stroke Patients Only) Pre-Morbid Rankin Score: No symptoms Modified Rankin: Moderately severe disability     Balance Overall balance assessment: Needs assistance Sitting-balance support: Feet supported;No upper extremity supported Sitting balance-Leahy Scale: Fair     Standing balance support: Bilateral upper extremity supported;During functional activity Standing balance-Leahy Scale: Poor Standing balance comment: reliant on external support                            Cognition Arousal/Alertness: Awake/alert Behavior During Therapy: Restless (irritable)   Area of Impairment: Orientation;Attention;Memory;Following commands;Safety/judgement;Awareness;Problem solving                 Orientation Level: Disoriented to;Time;Situation Current Attention Level: Sustained (focuses on ADL tasks x2 minutes) Memory: Decreased short-term memory Following Commands: Follows one step commands with increased time;Follows one step commands inconsistently Safety/Judgement: Decreased awareness of safety;Decreased awareness of deficits Awareness: Intellectual Problem Solving: Slow processing;Decreased initiation;Difficulty sequencing;Requires verbal cues;Requires tactile cues General Comments: Pt oriented to self and [facility] today, requires visual and verbal cues for orientation to time and unable to state back when prompted <1 minute later. Pt is restless, and at times irritable especially when PT and OT assisting pt with tasks or requesting pt wear his mask. Pt with periods of aphasic speech      Exercises  General Comments        Pertinent Vitals/Pain Pain Assessment: Faces Faces Pain Scale: Hurts a little bit Pain Location:  generalized, during ADL tasks Pain Descriptors / Indicators: Grimacing Pain Intervention(s): Limited activity within patient's tolerance;Monitored during session;Repositioned    Home Living                      Prior Function            PT Goals (current goals can now be found in the care plan section) Acute Rehab PT Goals Patient Stated Goal: back to work PT Goal Formulation: With patient Time For Goal Achievement: 04/04/20 Potential to Achieve Goals: Fair Progress towards PT goals: Progressing toward goals    Frequency    Min 3X/week      PT Plan Current plan remains appropriate    Co-evaluation PT/OT/SLP Co-Evaluation/Treatment: Yes Reason for Co-Treatment: For patient/therapist safety;Necessary to address cognition/behavior during functional activity;To address functional/ADL transfers;Complexity of the patient's impairments (multi-system involvement) PT goals addressed during session: Mobility/safety with mobility;Balance;Proper use of DME        AM-PAC PT "6 Clicks" Mobility   Outcome Measure  Help needed turning from your back to your side while in a flat bed without using bedrails?: A Little Help needed moving from lying on your back to sitting on the side of a flat bed without using bedrails?: A Little Help needed moving to and from a bed to a chair (including a wheelchair)?: A Lot Help needed standing up from a chair using your arms (e.g., wheelchair or bedside chair)?: A Lot Help needed to walk in hospital room?: A Lot Help needed climbing 3-5 steps with a railing? : Total 6 Click Score: 13    End of Session Equipment Utilized During Treatment: Gait belt Activity Tolerance: Patient tolerated treatment well Patient left: in bed;with call bell/phone within reach;with bed alarm set (mitts) Nurse Communication: Mobility status PT Visit Diagnosis: Unsteadiness on feet (R26.81);Other abnormalities of gait and mobility (R26.89);Difficulty in walking,  not elsewhere classified (R26.2);Other symptoms and signs involving the nervous system (R29.898) Hemiplegia - Right/Left: Right Hemiplegia - dominant/non-dominant: Dominant Hemiplegia - caused by: Unspecified     Time: [id number] PT Time Calculation (min) (ACUTE ONLY): 38 min  Charges:  $Gait Training: 8-22 mins                     Stacie Glaze, PT Acute Rehabilitation Services Pager [phone]  Office [phone]  Louis Matte 03/23/2020, 12:55 PM

## 2020-03-23 NOTE — Progress Notes (Signed)
Nutrition Follow-up  DOCUMENTATION CODES:   Obesity unspecified  INTERVENTION:  Continue Ensure Enlive po BID (thickened to appropriate consistency), each supplement provides 350 kcal and 20 grams of protein  Encourage adequate PO intake.   NUTRITION DIAGNOSIS:   Inadequate oral intake related to lethargy/confusion as evidenced by NPO status; diet advanced; ongoing  GOAL:   Patient will meet greater than or equal to 90% of their needs; progressing  MONITOR:   PO intake,Skin,Supplement acceptance,Weight trends,Labs,I & O's  REASON FOR ASSESSMENT:   Consult Enteral/tube feeding initiation and management  ASSESSMENT:   60 year old male who presented on 11/25 with seizure-like activity. Pt was recently found to have brain mass concerning for neoplasm with plan for left craniotomy on 12/01.  11/29- Cortrak placed,later pt pulled tube out 12/01-s/p crani of L parietal tumor  12/03- Cortrak placed,tip gastric 12/13-pt vomited, Cortrak repositionedto post-pyloric (4th portion of duodenum) 12/15-extubated 12/20 - diet advanced to dysphagia 1 with nectar-thick liquids 12/21 - FEES with recommendations for NPO with honey-thick water only 12/28 - Diet advanced    Pt continues on a dysphagia 1 diet with nectar thick liquids. Meal completion has been 50-80%. Cortrak NGT removed yesterday. Tube feeding discontinued. Pt currently has Ensure ordered and has been consuming them. RD to continue with current orders to aid in caloric and protein needs.    Labs and medications reviewed.   Diet Order:   Diet Order            DIET - DYS 1 Room service appropriate? Yes; Fluid consistency: Nectar Thick  Diet effective now                 EDUCATION NEEDS:   No education needs have been identified at this time  Skin:  Skin Assessment: Reviewed RN Assessment Skin Integrity Issues:: Incisions Incisions: head  Last BM:  1/3  Height:   Ht Readings from Last 1 Encounters:   02/17/20 6\' 3"  (1.905 m)    Weight:   Wt Readings from Last 1 Encounters:  03/21/20 127.7 kg    BMI:  Body mass index is 35.19 kg/m.  Estimated Nutritional Needs:   Kcal:  05/19/20  Protein:  120-145 grams  Fluid:  >/= 2.0 L  [phone], MS, RD, LDN RD pager number/after hours weekend pager number on Amion.

## 2020-03-23 NOTE — Progress Notes (Signed)
Restraints removed off patient and Coretrak removed per orders. Explained to patient criteria for remaining safe without needing restraints placed back on him. No evidence of learning but patient is currently calm. Bed alarm activated. Will continue to monitor.

## 2020-03-23 NOTE — Progress Notes (Signed)
  Speech Language Pathology Treatment: Dysphagia;Cognitive-Linquistic  Patient Details Name: Antonio Glenn MRN: [id number] DOB: [birth year] Today's Date: 03/23/2020 Time: 9675-9163 SLP Time Calculation (min) (ACUTE ONLY): 12 min  Assessment / Plan / Recommendation Clinical Impression  Observed pt eating breakfast with RN tech incorporating cognitive-linguistic intervention. He was calm, pleasant, verbose needing redirection to oral manipulation/swallow without verbalization. His expression is becoming more accurate and is able to express intended message when discussing his diagnosis and function prior to discovery of tumor. Verbalizations are mixed with numerous paraphasias. As therapist documenting, pt becoming verbally irritable and not able to comprehend or process need to remain in hospital and bilateral mitts etc. Continue max assist for his cognition, communication and swallow.  No s/s aspiration and puree/nectar continues to be appropriate due to vocalizations and cognitive impairments.    HPI HPI: Antonio Glenn is a 60 y.o. 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 Underwent resection 12/1. Intubated 12/1-12/15. Had FEES 12/21 with recomendation for NPO with occasional tsp honey thick water. MBS 12/28 revealed improvements with recs for dysphagia 1, nectar thick liquids.      SLP Plan  Continue with current plan of care       Recommendations  Diet recommendations: Dysphagia 1 (puree);Nectar-thick liquid Liquids provided via: Cup;Straw Medication Administration: Crushed with puree Supervision: Staff to assist with self feeding;Full supervision/cueing for compensatory strategies Compensations: Slow rate;Small sips/bites;Minimize environmental distractions Postural Changes and/or Swallow Maneuvers: Seated upright 90 degrees                Oral Care Recommendations: Oral care  BID Follow up Recommendations: Skilled Nursing facility SLP Visit Diagnosis: Dysphagia, oropharyngeal phase (R13.12);Aphasia (R47.01);Cognitive communication deficit (W46.659) Plan: Continue with current plan of care                       Royce Macadamia 03/23/2020, 9:14 AM  Breck Coons Lonell Face.Ed Nurse, children's [phone] Office [phone]

## 2020-03-24 DIAGNOSIS — G9389 Other specified disorders of brain: Secondary | ICD-10-CM | POA: Diagnosis not present

## 2020-03-24 DIAGNOSIS — R4182 Altered mental status, unspecified: Secondary | ICD-10-CM | POA: Diagnosis not present

## 2020-03-24 DIAGNOSIS — G9340 Encephalopathy, unspecified: Secondary | ICD-10-CM | POA: Diagnosis not present

## 2020-03-24 DIAGNOSIS — J9600 Acute respiratory failure, unspecified whether with hypoxia or hypercapnia: Secondary | ICD-10-CM | POA: Diagnosis not present

## 2020-03-24 LAB — GLUCOSE, CAPILLARY
Glucose-Capillary: 97 mg/dL (ref 70–99)
Glucose-Capillary: 115 mg/dL — ABNORMAL HIGH (ref 70–99)
Glucose-Capillary: 106 mg/dL — ABNORMAL HIGH (ref 70–99)
Glucose-Capillary: 104 mg/dL — ABNORMAL HIGH (ref 70–99)
Glucose-Capillary: 112 mg/dL — ABNORMAL HIGH (ref 70–99)

## 2020-03-24 NOTE — Progress Notes (Signed)
RN arrived in patients room to bed alarm. Patient had slid out of bed onto floor mat and was on his knees with hands on the bed. RN and staff assisted patient back into bed. Patient was assessed, no new injuries. MD notified. MD request patient to stay out of restraints if possible. Mittens placed at this time. Family notified.

## 2020-03-24 NOTE — TOC Progression Note (Signed)
Transition of Care [facility]) - Progression Note    Patient Details  Name: Antonio Glenn MRN: [id number] Date of Birth: 29-Aug-1960  Transition of Care [facility]) CM/SW Contact  Eduard Roux, [place] Phone Number: 03/24/2020, 3:32 PM  Clinical Narrative:    Patient has no confirmed bed offer-  CSW contacted [facility]- they has a hold on admission due to covid- they will know more tomorrow regarding admissions after further testing.   [facility]- has no availability  [facility]-hold on admissions  [address], [place] , requested they review  [facility]-no availability  Faxed referral to [facility] - for review   CSW will continue to follow and assist with discharge plan.  [name], MSW, LCSW Clinical Social Worker   Expected Discharge Plan: Skilled Nursing Facility Barriers to Discharge: SNF Pending bed offer  Expected Discharge Plan and Services Expected Discharge Plan: Skilled Nursing Facility   Discharge Planning Services: CM Consult Post Acute Care Choice: Skilled Nursing Facility Living arrangements for the past 2 months: Single Family Home                                       Social Determinants of Health (SDOH) Interventions    Readmission Risk Interventions No flowsheet data found.

## 2020-03-24 NOTE — Progress Notes (Signed)
Patient sitting on the edge of the bed, completely naked, soiled with urine and removed his IV. Pt yelled at multiple staff members when staff attempted to assist the patient. Patient refused, food, water, room temperature adjustments, blankets, ambulation and toileting. Staff attempted to assist patient back to the center of the bed and patient verbally started cussing at RNs. PRN valium given. Bed sheets, briefs and gown changed. Patient currently in bed refusing any additional assistance.

## 2020-03-24 NOTE — Progress Notes (Signed)
[facility] ([facility]) Community Based Palliative Care  [facility] continues to follow for any discharge planning needs and to coordinate admission onto palliative care as this patient has already been referred to our Palliative Care Services in the community.  If you have questions or need assistance, please call [phone] or contact the hospital Liaison listed on AMION.  Thank you for the opportunity to participate in this patient's care.  Sherry Gibson, RN [facility] Hospital Liaison [phone](24h on call)          

## 2020-03-24 NOTE — Progress Notes (Signed)
PROGRESS NOTE    JULIAS MOULD  IHW:[id number] DOB: 1960-05-08 DOA: 02/11/2020 PCP: Leonard Downing, MD   No chief complaint on file.  Brief Narrative: Mr. Warshawsky is a 60 yo male with PMH of obesity; admitted on11/25/2021, with complaint of seizures and confusion, was found to have brain mass s/p craniotomy on 12/1//2021 of which biopsy showed Grade 4 GBM.  Hospitalist consulted for medical management.  Palliative on board, patient currently a DNR due to very poor prognosis.  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 Poor prognosis and typically average of 2 year survival EEG negative for active seizures Vimpat and phenobarbital for seizures ppx Palliative care on board EEG negative for active seizures.  Dysphagia SLP on board, now recommend dysphagia type I, nectar thick liquids Strict aspiration precautions  Acute respiratory failure with hypoxia, POA secondary to encephalopathy Currently on room air Needed intubation initially, extubated on 12/15  MRSA HAP Resolved Low-grade fever on 12/17, resolved on its own Prior pneumonia, treated with IV vancomycin. Course completed.  Paroxysmal A. Fib Currently rate controlled. Anticoagulation on hold due to CNS mass  Thoracic aortic aneurysm Incidental diagnosis. Repeat  CT scan in 6 months  Hypertension Uncontrolled, likely worsened by agitation Continue clonidine, Norvasc, as needed hydralazine  Right upper extremity SVT No evidence of DVT No need for anticoagulation, also not a candidate  Obesity  Goals of care discussion Patient with poor prognosis, encephalopathic (possibly new baseline) Palliative consult appreciated, family decided to switch back to DNR Awaiting placement at SNF        DVT prophylaxis: SCD, lovenox Code Status: DNR Family Communication: none at bedside Disposition:   Status is: Inpatient  Remains inpatient appropriate because:Inpatient level of care appropriate due to severity of illness   Dispo: per primary       Consultants:   Neurosurgery is primary  [facility] consulting   PCCM  Procedures:  12/1 craniotomy  Antimicrobials:  Anti-infectives (From admission, onward)   Start     Dose/Rate Route Frequency Ordered Stop   02/29/20 0200  vancomycin (VANCOREADY) IVPB 1750 mg/350 mL        1,750 mg 175 mL/hr over 120 Minutes Intravenous Every 8 hours 02/28/20 1805 03/02/20 1955   02/25/20 1800  vancomycin (VANCOREADY) IVPB 2000 mg/400 mL  Status:  Discontinued        2,000 mg 200 mL/hr over 120 Minutes Intravenous Every 12 hours 02/25/20 1627 02/28/20 1805   02/17/20 2300  ceFAZolin (ANCEF) IVPB 2g/100 mL premix        2 g 200 mL/hr over 30 Minutes Intravenous Every 8 hours 02/17/20 1918 02/18/20 1507   02/17/20 0500  vancomycin (VANCOREADY) IVPB 1500 mg/300 mL        1,500 mg 150 mL/hr over 120 Minutes Intravenous 120 min pre-op 02/15/20 1325 02/18/20 1128         Subjective: Overnight noted  to be restless, disoriented.  This a.m. noted to be sleeping, easily arousable.  Denies any new complaints.  Objective: Vitals:   03/23/20 2347 03/24/20 0500 03/24/20 0736 03/24/20 1152  BP: 134/78  113/77 105/60  Pulse: (!) 105  (!) 105 88  Resp:   18 18  Temp: 98.3 F (36.8 C)  98.3 F (36.8 C)  98.2 F (36.8 C)  TempSrc:      SpO2:   99% 93%  Weight:  128.6 kg    Height:        Intake/Output Summary (Last 24 hours) at 03/24/2020 1344 Last data filed at 03/24/2020 0830 Gross per 24 hour  Intake 720 ml  Output 150 ml  Net 570 ml   Filed Weights   03/17/20 0500 03/21/20 0500 03/24/20 0500  Weight: 133.8 kg 127.7 kg 128.6 kg    Examination:  General: NAD  Cardiovascular: S1, S2 present  Respiratory: CTAB  Abdomen: Soft, nontender, nondistended, bowel sounds present  Musculoskeletal: No bilateral pedal edema noted  Skin: Normal  Psychiatry:  Normal mood    Data Reviewed: I have personally reviewed following labs and imaging studies  CBC: Recent Labs  Lab 03/19/20 0408 03/20/20 0110 03/21/20 0125 03/22/20 0342 03/23/20 0228  WBC 10.4 11.5* 10.5 10.9* 8.7  NEUTROABS 6.3 7.1 6.5 6.9 5.0  HGB 13.8 14.2 13.3 14.2 14.3  HCT 40.5 41.2 40.4 42.9 41.8  MCV 89.2 88.2 89.8 90.7 90.5  PLT 536* 569* 519* 495* 462*    Basic Metabolic Panel: Recent Labs  Lab 03/19/20 0142 03/19/20 0830 03/20/20 0110 03/21/20 0125 03/22/20 0342 03/23/20 0228  NA 137  --  137 139 138 139  K 5.6* 4.0 4.0 4.1 3.9 3.8  CL 104  --  102 107 104 105  CO2 20*  --  25 24 26 25   GLUCOSE 83  --  110* 100* 135* 103*  BUN 16  --  13 15 14 15   CREATININE 0.57*  --  0.66 0.60* 0.63 0.64  CALCIUM 8.8*  --  9.0 8.9 8.6* 8.7*  MG 2.1  --  2.2 2.1 2.0 2.0  PHOS 4.4  --  4.0 3.8 4.2 3.9    GFR: Estimated Creatinine Clearance: 141.8 mL/min (by C-G formula based on SCr of 0.64 mg/dL).  Liver Function Tests: Recent Labs  Lab 03/19/20 0142 03/20/20 0110 03/21/20 0125 03/22/20 0342 03/23/20 0228  AST 48* 36 32 32 29  ALT 60* 67* 63* 62* 58*  ALKPHOS 158* 175* 162* 161* 160*  BILITOT QUANTITY NOT SUFFICIENT, UNABLE TO PERFORM TEST 0.8 0.6 0.8 0.9  PROT 6.0* 7.2 6.5 6.4* 6.3*  ALBUMIN 3.0* 3.3* 3.0* 2.9* 2.9*    CBG: Recent Labs  Lab 03/23/20 1952 03/23/20 2349 03/24/20 0410  03/24/20 0737 03/24/20 1149  GLUCAP 119* 106* 97 115* 106*     Recent Results (from the past 240 hour(s))  MRSA PCR Screening     Status: None   Collection Time: 03/18/20 11:13 AM   Specimen: Nasal Mucosa; Nasopharyngeal  Result Value Ref Range Status   MRSA by PCR NEGATIVE NEGATIVE Final    Comment:        The GeneXpert MRSA Assay (FDA approved for NASAL specimens only), is one component of a comprehensive MRSA colonization surveillance program. It is not intended to diagnose MRSA infection nor to guide or monitor treatment for MRSA infections. Performed at [facility], [place] [address] [place], [place] [address]  Radiology Studies: No results found.      Scheduled Meds: . amLODipine  10 mg Per Tube Daily  . bethanechol  10 mg Per Tube TID  . Chlorhexidine Gluconate Cloth  6 each Topical Daily  . cloNIDine  0.1 mg Per Tube Daily  . docusate  100 mg Per Tube BID  . enoxaparin (LOVENOX) injection  40 mg Subcutaneous Q24H  . feeding supplement  237 mL Oral BID BM  . insulin aspart  0-15 Units Subcutaneous Q4H  . lacosamide  100 mg Per Tube BID  . pantoprazole sodium  40 mg Per Tube Daily  . PHENObarbital  90 mg Per Tube BID  . QUEtiapine  200 mg Per Tube BID   Continuous Infusions: . sodium chloride Stopped (02/26/20 2221)  . sodium chloride Stopped (03/02/20 1734)     LOS: 42 days      Alma Friendly, MD [facility]  03/24/2020, 1:44 PM

## 2020-03-24 NOTE — Progress Notes (Signed)
PT Cancellation Note  Patient Details Name: Antonio Glenn MRN: [id number] DOB: 10/28/1960   Cancelled Treatment:    Reason Eval/Treat Not Completed: Patient declined, no reason specified. The pt was initially interested and agreeable to PT suggestions, but demos slight increase in frustration with attempts by PT to initiate movement. Pt stated he was too tired for therapy at this time, declined session today. PT will continue to follow and progress mobility as able.  Rolm Baptise, PT, DPT   Acute Rehabilitation Department Pager #: [phone]   Antonio Glenn 03/24/2020, 5:01 PM

## 2020-03-24 NOTE — Discharge Instructions (Signed)
Palliative Care to follow at [facility]

## 2020-03-24 NOTE — Progress Notes (Signed)
Subjective: Patient reports no headache Objective: Vital signs in last 24 hours: Temp:  [98.1 F (36.7 C)-98.9 F (37.2 C)] 98.3 F (36.8 C) (01/06 0736) Pulse Rate:  [93-105] 105 (01/06 0736) Resp:  [13-20] 18 (01/06 0736) BP: (113-147)/(77-91) 113/77 (01/06 0736) SpO2:  [97 %-99 %] 99 % (01/06 0736) Weight:  [128.6 kg] 128.6 kg (01/06 0500)  Intake/Output from previous day: 01/05 0701 - 01/06 0700 In: 360 [P.O.:360] Out: 150 [Urine:150] Intake/Output this shift: No intake/output data recorded.  Pleasant, conversant today. Alert, oriented to person only, but states he feels his "thinking is getting better".  FC x 4, symmetric Incision c/d  Lab Results: Recent Labs    03/22/20 0342 03/23/20 0228  WBC 10.9* 8.7  HGB 14.2 14.3  HCT 42.9 41.8  PLT 495* 462*   BMET Recent Labs    03/22/20 0342 03/23/20 0228  NA 138 139  K 3.9 3.8  CL 104 105  CO2 26 25  GLUCOSE 135* 103*  BUN 14 15  CREATININE 0.63 0.64  CALCIUM 8.6* 8.7*    Studies/Results: No results found.  Assessment/Plan: S/p crani for left parietal lobe GBM - awaiting SNF   LOS: 42 days     [name] 03/24/2020, 10:52 AM

## 2020-03-25 DIAGNOSIS — R4182 Altered mental status, unspecified: Secondary | ICD-10-CM | POA: Diagnosis not present

## 2020-03-25 DIAGNOSIS — G9389 Other specified disorders of brain: Secondary | ICD-10-CM | POA: Diagnosis not present

## 2020-03-25 DIAGNOSIS — G9340 Encephalopathy, unspecified: Secondary | ICD-10-CM | POA: Diagnosis not present

## 2020-03-25 DIAGNOSIS — J9600 Acute respiratory failure, unspecified whether with hypoxia or hypercapnia: Secondary | ICD-10-CM | POA: Diagnosis not present

## 2020-03-25 LAB — COMPREHENSIVE METABOLIC PANEL
ALT: 55 U/L — ABNORMAL HIGH (ref 0–44)
AST: 29 U/L (ref 15–41)
Albumin: 2.9 g/dL — ABNORMAL LOW (ref 3.5–5.0)
Alkaline Phosphatase: 145 U/L — ABNORMAL HIGH (ref 38–126)
Anion gap: 12 (ref 5–15)
BUN: 15 mg/dL (ref 6–20)
CO2: 27 mmol/L (ref 22–32)
Calcium: 8.7 mg/dL — ABNORMAL LOW (ref 8.9–10.3)
Chloride: 105 mmol/L (ref 98–111)
Creatinine, Ser: 0.7 mg/dL (ref 0.61–1.24)
GFR, Estimated: >60 mL/min (ref >60)
Glucose, Bld: 119 mg/dL — ABNORMAL HIGH (ref 70–99)
Potassium: 3.6 mmol/L (ref 3.5–5.1)
Sodium: 144 mmol/L (ref 135–145)
Total Bilirubin: 0.5 mg/dL (ref 0.3–1.2)
Total Protein: 6.1 g/dL — ABNORMAL LOW (ref 6.5–8.1)

## 2020-03-25 LAB — CBC WITH DIFFERENTIAL/PLATELET
Abs Immature Granulocytes: 0.11 10*3/uL — ABNORMAL HIGH (ref 0.00–0.07)
Basophils Absolute: 0.1 10*3/uL (ref 0.0–0.1)
Basophils Relative: 1 %
Eosinophils Absolute: 0.9 10*3/uL — ABNORMAL HIGH (ref 0.0–0.5)
Eosinophils Relative: 10 %
HCT: 41.1 % (ref 39.0–52.0)
Hemoglobin: 13.1 g/dL (ref 13.0–17.0)
Immature Granulocytes: 1 %
Lymphocytes Relative: 22 %
Lymphs Abs: 1.9 10*3/uL (ref 0.7–4.0)
MCH: 29.4 pg (ref 26.0–34.0)
MCHC: 31.9 g/dL (ref 30.0–36.0)
MCV: 92.2 fL (ref 80.0–100.0)
Monocytes Absolute: 1 10*3/uL (ref 0.1–1.0)
Monocytes Relative: 12 %
Neutro Abs: 4.6 10*3/uL (ref 1.7–7.7)
Neutrophils Relative %: 54 %
Platelets: 429 10*3/uL — ABNORMAL HIGH (ref 150–400)
RBC: 4.46 MIL/uL (ref 4.22–5.81)
RDW: 13.8 % (ref 11.5–15.5)
WBC: 8.6 10*3/uL (ref 4.0–10.5)
nRBC: 0 % (ref 0.0–0.2)

## 2020-03-25 LAB — GLUCOSE, CAPILLARY
Glucose-Capillary: 101 mg/dL — ABNORMAL HIGH (ref 70–99)
Glucose-Capillary: 103 mg/dL — ABNORMAL HIGH (ref 70–99)
Glucose-Capillary: 106 mg/dL — ABNORMAL HIGH (ref 70–99)
Glucose-Capillary: 119 mg/dL — ABNORMAL HIGH (ref 70–99)
Glucose-Capillary: 125 mg/dL — ABNORMAL HIGH (ref 70–99)
Glucose-Capillary: 131 mg/dL — ABNORMAL HIGH (ref 70–99)
Glucose-Capillary: 88 mg/dL (ref 70–99)

## 2020-03-25 MED ORDER — PANTOPRAZOLE SODIUM 40 MG PO PACK
40.0000 mg | PACK | Freq: Every day | ORAL | Status: DC
  Administered 2020-03-26: 40 mg
  Filled 2020-03-25: qty 20

## 2020-03-25 MED ORDER — PANTOPRAZOLE SODIUM 40 MG PO TBEC: 40.0000 mg | DELAYED_RELEASE_TABLET | Freq: Every day | ORAL | Status: DC

## 2020-03-25 NOTE — Progress Notes (Signed)
Subjective: Patient reports no HAs  Objective: Vital signs in last 24 hours: Temp:  [98.2 F (36.8 C)-99.2 F (37.3 C)] 98.8 F (37.1 C) (01/07 0750) Pulse Rate:  [84-97] 84 (01/07 0750) Resp:  [17-19] 18 (01/07 0750) BP: (105-150)/(60-95) 120/67 (01/07 0750) SpO2:  [93 %-99 %] 97 % (01/07 0750) Weight:  [131.7 kg] 131.7 kg (01/07 0500)  Intake/Output from previous day: 01/06 0701 - 01/07 0700 In: 1074 [P.O.:1074] Out: 625 [Urine:625] Intake/Output this shift: No intake/output data recorded.  Resting comfortably, alert, oriented to person FC x 4 Incision healing well  Lab Results: Recent Labs    03/23/20 0228 03/25/20 0558  WBC 8.7 8.6  HGB 14.3 13.1  HCT 41.8 41.1  PLT 462* 429*   BMET Recent Labs    03/23/20 0228 03/25/20 0558  NA 139 144  K 3.8 3.6  CL 105 105  CO2 25 27  GLUCOSE 103* 119*  BUN 15 15  CREATININE 0.64 0.70  CALCIUM 8.7* 8.7*    Studies/Results: No results found.  Assessment/Plan: S/p resection of L parietal GBM - awaiting SNF    [name] 03/25/2020, 9:28 AM

## 2020-03-25 NOTE — Progress Notes (Signed)
PROGRESS NOTE    Antonio Glenn  YYT:[id number] DOB: 1960-06-02 DOA: 02/11/2020 PCP: Leonard Downing, MD   No chief complaint on file.  Brief Narrative: Antonio Glenn is a 60 yo male with PMH of obesity; admitted on11/25/2021, with complaint of seizures and confusion, was found to have brain mass s/p craniotomy on 12/1//2021 of which biopsy showed Grade 4 GBM.  Hospitalist consulted for medical management.  Palliative on board, patient currently a DNR due to very poor prognosis.  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 Poor prognosis and typically average of 2 year survival EEG negative for active seizures Vimpat and phenobarbital for seizures ppx Palliative care on board EEG negative for active seizures.  Dysphagia SLP on board, now recommend dysphagia type I, nectar thick liquids Strict aspiration precautions  Acute respiratory failure with hypoxia, POA secondary to encephalopathy Currently on room air Needed intubation initially, extubated on 12/15  MRSA HAP Resolved Low-grade fever on 12/17, resolved on its own Prior pneumonia, treated with IV vancomycin. Course completed.  Paroxysmal A. Fib Currently rate controlled. Anticoagulation on hold due to CNS mass  Thoracic aortic aneurysm Incidental diagnosis. Repeat  CT scan in 6 months  Hypertension BP stable Continue clonidine, Norvasc, as needed hydralazine  Right upper extremity SVT No evidence of DVT No need for anticoagulation, also not a candidate  Obesity  Goals of care discussion Patient with poor prognosis, encephalopathic (possibly new baseline) Palliative consult appreciated, family decided to switch back to DNR Awaiting placement at SNF        DVT prophylaxis: SCD, lovenox Code Status: DNR Family Communication: none at bedside Disposition:   Status is: Inpatient  Remains inpatient appropriate because:Inpatient level of care appropriate due to severity of illness   Dispo: per primary       Consultants:   Neurosurgery is primary  [facility] consulting   PCCM  Procedures:  12/1 craniotomy  Antimicrobials:  Anti-infectives (From admission, onward)   Start     Dose/Rate Route Frequency Ordered Stop   02/29/20 0200  vancomycin (VANCOREADY) IVPB 1750 mg/350 mL        1,750 mg 175 mL/hr over 120 Minutes Intravenous Every 8 hours 02/28/20 1805 03/02/20 1955   02/25/20 1800  vancomycin (VANCOREADY) IVPB 2000 mg/400 mL  Status:  Discontinued        2,000 mg 200 mL/hr over 120 Minutes Intravenous Every 12 hours 02/25/20 1627 02/28/20 1805   02/17/20 2300  ceFAZolin (ANCEF) IVPB 2g/100 mL premix        2 g 200 mL/hr over 30 Minutes Intravenous Every 8 hours 02/17/20 1918 02/18/20 1507   02/17/20 0500  vancomycin (VANCOREADY) IVPB 1500 mg/300 mL        1,500 mg 150 mL/hr over 120 Minutes Intravenous 120 min pre-op 02/15/20 1325 02/18/20 1128         Subjective: Today, patient resting comfortably, alert,  awake, oriented to self.  Unable to have a meaningful conversation with patient.  Objective: Vitals:   03/25/20 0012 03/25/20 0324 03/25/20 0500 03/25/20 0750  BP:  106/67  120/67  Pulse:  86  84  Resp:  19  18  Temp: 98.3 F (36.8 C) 98.6 F (37 C)  98.8 F (37.1 C)  TempSrc:      SpO2:  99%  97%   Weight:   131.7 kg   Height:        Intake/Output Summary (Last 24 hours) at 03/25/2020 1133 Last data filed at 03/25/2020 0700 Gross per 24 hour  Intake 714 ml  Output 625 ml  Net 89 ml   Filed Weights   03/21/20 0500 03/24/20 0500 03/25/20 0500  Weight: 127.7 kg 128.6 kg 131.7 kg    Examination:  General: NAD  Cardiovascular: S1, S2 present  Respiratory: CTAB  Abdomen: Soft, nontender, nondistended, bowel sounds present  Musculoskeletal: No bilateral pedal edema noted  Skin: Normal  Psychiatry: Stable mood    Data Reviewed: I have personally reviewed following labs and imaging studies  CBC: Recent Labs  Lab 03/20/20 0110 03/21/20 0125 03/22/20 0342 03/23/20 0228 03/25/20 0558  WBC 11.5* 10.5 10.9* 8.7 8.6  NEUTROABS 7.1 6.5 6.9 5.0 4.6  HGB 14.2 13.3 14.2 14.3 13.1  HCT 41.2 40.4 42.9 41.8 41.1  MCV 88.2 89.8 90.7 90.5 92.2  PLT 569* 519* 495* 462* 429*    Basic Metabolic Panel: Recent Labs  Lab 03/19/20 0142 03/19/20 0830 03/20/20 0110 03/21/20 0125 03/22/20 0342 03/23/20 0228 03/25/20 0558  NA 137  --  137 139 138 139 144  K 5.6*   < > 4.0 4.1 3.9 3.8 3.6  CL 104  --  102 107 104 105 105  CO2 20*  --  _0 GLUCOSE 83  --  110* 100* 135* 103* 119*  BUN 16  --  _1 CREATININE 0.57*  --  0.66 0.60* 0.63 0.64 0.70  CALCIUM 8.8*  --  9.0 8.9 8.6* 8.7* 8.7*  MG 2.1  --  2.2 2.1 2.0 2.0  --   PHOS 4.4  --  4.0 3.8 4.2 3.9  --    < > = values in this interval not displayed.    GFR: Estimated Creatinine Clearance: 143.6 mL/min (by C-G formula based on SCr of 0.7 mg/dL).  Liver Function Tests: Recent Labs  Lab 03/20/20 0110 03/21/20 0125 03/22/20 0342 03/23/20 0228 03/25/20 0558  AST 36 32 32 29 29  ALT 67* 63* 62* 58* 55*  ALKPHOS 175* 162* 161* 160* 145*  BILITOT 0.8 0.6 0.8 0.9 0.5  PROT 7.2 6.5 6.4* 6.3* 6.1*  ALBUMIN 3.3* 3.0* 2.9* 2.9* 2.9*    CBG: Recent Labs  Lab 03/24/20 1612 03/24/20 2006  03/25/20 0013 03/25/20 0323 03/25/20 0749  GLUCAP 104* 112* 119* 106* 103*     Recent Results (from the past 240 hour(s))  MRSA PCR Screening     Status: None   Collection Time: 03/18/20 11:13 AM   Specimen: Nasal Mucosa; Nasopharyngeal  Result Value Ref Range Status   MRSA by PCR NEGATIVE NEGATIVE Final    Comment:        The GeneXpert MRSA Assay (FDA approved for NASAL specimens only), is one component of a comprehensive MRSA colonization surveillance program. It is not intended to diagnose MRSA infection nor to guide or monitor treatment for MRSA infections. Performed at [facility]  [facility], [place] [address] [place], [place] [address]          Radiology Studies: No results found.      Scheduled Meds: . amLODipine  10 mg Per Tube Daily  . bethanechol  10 mg Per Tube TID  . Chlorhexidine Gluconate Cloth  6 each Topical Daily  . cloNIDine  0.1 mg Per Tube Daily  . docusate  100 mg Per Tube BID  . enoxaparin (LOVENOX) injection  40 mg Subcutaneous Q24H  . feeding supplement  237 mL Oral BID BM  . insulin aspart  0-15 Units Subcutaneous Q4H  . lacosamide  100 mg Per Tube BID  . pantoprazole sodium  40 mg Per Tube Daily  . PHENObarbital  90 mg Per Tube BID  . QUEtiapine  200 mg Per Tube BID   Continuous Infusions: . sodium chloride Stopped (02/26/20 2221)  . sodium chloride Stopped (03/02/20 1734)     LOS: 74 days      Alma Friendly, MD [facility]  03/25/2020, 11:33 AM

## 2020-03-25 NOTE — Progress Notes (Signed)
Physical Therapy Treatment Patient Details Name: Antonio Glenn MRN: [id number] DOB: February 12, 1961 Today's Date: 03/25/2020    History of Present Illness Antonio Glenn is a 60 y.o. male with hx of recently diagnosed brain tumor who was transferred from [place] ED to [facility] 11/25 with AMS and seizures.CRANIOTOMY OF  LEFT PARIETAL TUMOR 12/1 and on vent, trach off vent 12/15    PT Comments    Pt is progressing toward mobility goals.  Today's emphasis on following commands, directional cues and working on gait stability.  Pt did become very distracted by patient in their rooms, calling out to them and losing stability control of RW and gait.    Follow Up Recommendations  SNF     Equipment Recommendations  Other (comment)    Recommendations for Other Services       Precautions / Restrictions Precautions Precautions: Fall    Mobility  Bed Mobility Overal bed mobility: Needs Assistance Bed Mobility: Supine to Sit     Supine to sit: Min assist     General bed mobility comments: pt struggle to get up rolling over onto R UE.  Needed cues to process change of technique to roll over and come up via L elbow.  Still needed minimal assist.  Transfers Overall transfer level: Needs assistance Equipment used: Rolling walker (2 wheeled) Transfers: Sit to/from Stand Sit to Stand: Mod assist         General transfer comment: cues for hand placement and general safety.  Ambulation/Gait Ambulation/Gait assistance: Mod assist Gait Distance (Feet): 400 Feet Assistive device: Rolling walker (2 wheeled) Gait Pattern/deviations: Step-through pattern;Decreased stride length;Staggering right;Scissoring;Shuffle;Drifts right/left Gait velocity: decr Gait velocity interpretation: 1.31 - 2.62 ft/sec, indicative of limited community ambulator General Gait Details: unsteady overall with drifting, staggering, running into objects on the R.  Having trouble understanding that obstacles are keeping him  from advancing the RW.  Pt losing focus on task by every pt that he walks by, interrupting the pt or nursing, not aware of social cues.  Pt's gait slowly degrading with fatigue and decreasing focus on task.   Stairs             Wheelchair Mobility    Modified Rankin (Stroke Patients Only) Modified Rankin (Stroke Patients Only) Modified Rankin: Moderately severe disability     Balance Overall balance assessment: Needs assistance Sitting-balance support: No upper extremity supported Sitting balance-Leahy Scale: Fair     Standing balance support: Bilateral upper extremity supported;During functional activity Standing balance-Leahy Scale: Poor Standing balance comment: reliant on external support                            Cognition Arousal/Alertness: Awake/alert Behavior During Therapy: Restless Overall Cognitive Status: Impaired/Different from baseline Area of Impairment: Orientation;Attention;Memory;Following commands;Safety/judgement;Awareness;Problem solving                 Orientation Level: Time;Situation Current Attention Level: Sustained Memory: Decreased short-term memory Following Commands: Follows one step commands with increased time;Follows one step commands inconsistently Safety/Judgement: Decreased awareness of safety;Decreased awareness of deficits Awareness: Intellectual Problem Solving: Slow processing;Decreased initiation;Difficulty sequencing;Requires verbal cues;Requires tactile cues        Exercises      General Comments        Pertinent Vitals/Pain Pain Assessment: Faces Faces Pain Scale: No hurt Pain Intervention(s): Monitored during session    Home Living  Prior Function            PT Goals (current goals can now be found in the care plan section) Acute Rehab PT Goals Patient Stated Goal: back to work PT Goal Formulation: With patient Time For Goal Achievement: 04/04/20 Potential  to Achieve Goals: Fair Progress towards PT goals: Progressing toward goals    Frequency    Min 3X/week      PT Plan Current plan remains appropriate    Co-evaluation              AM-PAC PT "6 Clicks" Mobility   Outcome Measure  Help needed turning from your back to your side while in a flat bed without using bedrails?: A Little Help needed moving from lying on your back to sitting on the side of a flat bed without using bedrails?: A Little Help needed moving to and from a bed to a chair (including a wheelchair)?: A Lot Help needed standing up from a chair using your arms (e.g., wheelchair or bedside chair)?: A Lot Help needed to walk in hospital room?: A Lot Help needed climbing 3-5 steps with a railing? : Total 6 Click Score: 13    End of Session   Activity Tolerance: Patient tolerated treatment well Patient left: in bed;with call bell/phone within reach;with bed alarm set Nurse Communication: Mobility status PT Visit Diagnosis: Other abnormalities of gait and mobility (R26.89);Pain Hemiplegia - Right/Left: Right Hemiplegia - dominant/non-dominant: Dominant Hemiplegia - caused by: Unspecified     Time: 2993-7169 PT Time Calculation (min) (ACUTE ONLY): 21 min  Charges:  $Gait Training: 8-22 mins                     03/25/2020  Ginger Carne., PT Acute Rehabilitation Services [phone]  (pager) [phone]  (office)   Tessie Fass Lakyla Biswas 03/25/2020, 6:08 PM

## 2020-03-25 NOTE — Plan of Care (Signed)

## 2020-03-26 DIAGNOSIS — G9389 Other specified disorders of brain: Secondary | ICD-10-CM | POA: Diagnosis not present

## 2020-03-26 DIAGNOSIS — J9600 Acute respiratory failure, unspecified whether with hypoxia or hypercapnia: Secondary | ICD-10-CM | POA: Diagnosis not present

## 2020-03-26 DIAGNOSIS — G9340 Encephalopathy, unspecified: Secondary | ICD-10-CM | POA: Diagnosis not present

## 2020-03-26 DIAGNOSIS — R4182 Altered mental status, unspecified: Secondary | ICD-10-CM | POA: Diagnosis not present

## 2020-03-26 LAB — GLUCOSE, CAPILLARY
Glucose-Capillary: 95 mg/dL (ref 70–99)
Glucose-Capillary: 91 mg/dL (ref 70–99)
Glucose-Capillary: 122 mg/dL — ABNORMAL HIGH (ref 70–99)
Glucose-Capillary: 186 mg/dL — ABNORMAL HIGH (ref 70–99)
Glucose-Capillary: 141 mg/dL — ABNORMAL HIGH (ref 70–99)

## 2020-03-26 MED ORDER — DOCUSATE SODIUM 50 MG/5ML PO LIQD
100.0000 mg | Freq: Two times a day (BID) | ORAL | Status: DC
  Administered 2020-03-26 – 2020-04-12 (×29): 100 mg via ORAL
  Filled 2020-03-26 (×30): qty 10

## 2020-03-26 MED ORDER — PHENOBARBITAL 20 MG/5ML PO ELIX
90.0000 mg | ORAL_SOLUTION | Freq: Two times a day (BID) | ORAL | Status: DC
Start: 2020-03-26 — End: 2020-04-13
  Administered 2020-03-26 – 2020-04-12 (×34): 90 mg via ORAL
  Filled 2020-03-26 (×8): qty 22.5
  Filled 2020-03-26: qty 7.5
  Filled 2020-03-26 (×26): qty 22.5

## 2020-03-26 MED ORDER — BETHANECHOL CHLORIDE 10 MG PO TABS
10.0000 mg | ORAL_TABLET | Freq: Three times a day (TID) | ORAL | Status: DC
  Administered 2020-03-26 – 2020-04-12 (×51): 10 mg via ORAL
  Filled 2020-03-26 (×54): qty 1

## 2020-03-26 MED ORDER — QUETIAPINE FUMARATE 100 MG PO TABS
200.0000 mg | ORAL_TABLET | Freq: Two times a day (BID) | ORAL | Status: DC
  Administered 2020-03-26 – 2020-04-01 (×12): 200 mg via ORAL
  Filled 2020-03-26 (×12): qty 2

## 2020-03-26 MED ORDER — PANTOPRAZOLE SODIUM 40 MG PO PACK
40.0000 mg | PACK | Freq: Every day | ORAL | Status: DC
Start: 2020-03-27 — End: 2020-04-13
  Administered 2020-03-27 – 2020-04-12 (×15): 40 mg via ORAL
  Filled 2020-03-26 (×17): qty 20

## 2020-03-26 MED ORDER — AMLODIPINE BESYLATE 10 MG PO TABS
10.0000 mg | ORAL_TABLET | Freq: Every day | ORAL | Status: DC
  Administered 2020-03-27 – 2020-04-01 (×6): 10 mg via ORAL
  Filled 2020-03-26 (×6): qty 1

## 2020-03-26 MED ORDER — CLONIDINE HCL 0.1 MG PO TABS
0.1000 mg | ORAL_TABLET | Freq: Every day | ORAL | Status: DC
  Administered 2020-03-27 – 2020-03-31 (×5): 0.1 mg via ORAL
  Filled 2020-03-26 (×6): qty 1

## 2020-03-26 MED ORDER — LACOSAMIDE 50 MG PO TABS
100.0000 mg | ORAL_TABLET | Freq: Two times a day (BID) | ORAL | Status: DC
Start: 2020-03-26 — End: 2020-04-13
  Administered 2020-03-26 – 2020-04-12 (×34): 100 mg via ORAL
  Filled 2020-03-26 (×35): qty 2

## 2020-03-26 NOTE — Progress Notes (Signed)
Subjective: The patient is alert and pleasant.  He has no complaints.  Objective: Vital signs in last 24 hours: Temp:  [97.5 F (36.4 C)-99.1 F (37.3 C)] 97.8 F (36.6 C) (01/08 0741) Pulse Rate:  [87-97] 87 (01/08 0440) Resp:  [17-18] 17 (01/08 0440) BP: (106-148)/(64-97) 148/97 (01/08 0741) SpO2:  [93 %-98 %] 98 % (01/08 0440) Weight:  [130.8 kg] 130.8 kg (01/08 0500) Estimated body mass index is 36.04 kg/m as calculated from the following:   Height as of this encounter: 6\' 3"  (1.905 m).   Weight as of this encounter: 130.8 kg.   Intake/Output from previous day: 01/07 0701 - 01/08 0700 In: 480 [P.O.:480] Out: 750 [Urine:750] Intake/Output this shift: No intake/output data recorded.  Physical exam the patient is alert and pleasant.  Lab Results: Recent Labs    03/25/20 0558  WBC 8.6  HGB 13.1  HCT 41.1  PLT 429*   BMET Recent Labs    03/25/20 0558  NA 144  K 3.6  CL 105  CO2 27  GLUCOSE 119*  BUN 15  CREATININE 0.70  CALCIUM 8.7*    Studies/Results: No results found.  Assessment/Plan: Status post resection of left glioblastoma day #36.  We are awaiting nursing home placement.  LOS: 44 days     Ophelia Charter 03/26/2020, 9:12 AM

## 2020-03-26 NOTE — Plan of Care (Signed)

## 2020-03-26 NOTE — Progress Notes (Signed)
Patient very irritable and agitated.  Threw lunch tray onto floor and trying to get out of bed.  Unwilling to listen to explanations on what is happening, only gets angrier when trying to talk with patient.  Haldol previously given but not effective.  Valium given at this time.  Will continue to monitor.

## 2020-03-26 NOTE — Progress Notes (Signed)
PROGRESS NOTE    Antonio Glenn  IWP:[id number] DOB: May 26, 1960 DOA: 02/11/2020 PCP: Leonard Downing, MD   No chief complaint on file.  Brief Narrative: Antonio Glenn is a 60 yo male with PMH of obesity; admitted on11/25/2021, with complaint of seizures and confusion, was found to have brain mass s/p craniotomy on 12/1//2021 of which biopsy showed Grade 4 GBM.  Hospitalist consulted for medical management.  Palliative on board, patient currently a DNR due to very poor prognosis.  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 Poor prognosis and typically average of 2 year survival EEG negative for active seizures Vimpat and phenobarbital for seizures ppx Palliative care on board EEG negative for active seizures.  Dysphagia SLP on board, now recommend dysphagia type I, nectar thick liquids Strict aspiration precautions  Acute respiratory failure with hypoxia, POA secondary to encephalopathy Currently on room air Needed intubation initially, extubated on 12/15  MRSA HAP Resolved Low-grade fever on 12/17, resolved on its own Prior pneumonia, treated with IV vancomycin. Course completed.  Paroxysmal A. Fib Currently rate controlled. Anticoagulation on hold due to CNS mass  Thoracic aortic aneurysm Incidental diagnosis. Repeat  CT scan in 6 months  Hypertension BP stable Continue clonidine, Norvasc, as needed hydralazine  Right upper extremity SVT No evidence of DVT No need for anticoagulation, also not a candidate  Obesity  Goals of care discussion Patient with poor prognosis, encephalopathic (possibly new baseline) Palliative consult appreciated, family decided to switch back to DNR Awaiting placement at SNF        DVT prophylaxis: SCD, lovenox Code Status: DNR Family Communication: None at bedside Disposition:   Status is: Inpatient  Remains inpatient appropriate because:Inpatient level of care appropriate due to severity of illness   Dispo: per primary       Consultants:   Neurosurgery is primary  [facility] consulting   PCCM  Procedures:  12/1 craniotomy  Antimicrobials:  Anti-infectives (From admission, onward)   Start     Dose/Rate Route Frequency Ordered Stop   02/29/20 0200  vancomycin (VANCOREADY) IVPB 1750 mg/350 mL        1,750 mg 175 mL/hr over 120 Minutes Intravenous Every 8 hours 02/28/20 1805 03/02/20 1955   02/25/20 1800  vancomycin (VANCOREADY) IVPB 2000 mg/400 mL  Status:  Discontinued        2,000 mg 200 mL/hr over 120 Minutes Intravenous Every 12 hours 02/25/20 1627 02/28/20 1805   02/17/20 2300  ceFAZolin (ANCEF) IVPB 2g/100 mL premix        2 g 200 mL/hr over 30 Minutes Intravenous Every 8 hours 02/17/20 1918 02/18/20 1507   02/17/20 0500  vancomycin (VANCOREADY) IVPB 1500 mg/300 mL        1,500 mg 150 mL/hr over 120 Minutes Intravenous 120 min pre-op 02/15/20 1325 02/18/20 1128         Subjective: Patient resting comfortably in bed  earlier this am.   Objective: Vitals:   03/26/20 0440 03/26/20 0500 03/26/20 0741 03/26/20 1146  BP: (!) 136/91  (!) 148/97 132/85  Pulse: 87  90   Resp: 17     Temp: (!) 97.5 F (36.4 C)  97.8 F (36.6 C) 97.6 F (36.4 C)  TempSrc: Oral  Oral Oral  SpO2: 98%     Weight:  130.8 kg    Height:         Intake/Output Summary (Last 24 hours) at 03/26/2020 1536 Last data filed at 03/26/2020 1300 Gross per 24 hour  Intake 460 ml  Output 350 ml  Net 110 ml   Filed Weights   03/24/20 0500 03/25/20 0500 03/26/20 0500  Weight: 128.6 kg 131.7 kg 130.8 kg    Examination:  General: NAD  Cardiovascular: S1, S2 present  Respiratory: CTAB  Abdomen: Soft, nontender, nondistended, bowel sounds present  Musculoskeletal: No bilateral pedal edema noted  Skin: Normal  Psychiatry:  Fluctuating mood, intermittently agitated    Data Reviewed: I have personally reviewed following labs and imaging studies  CBC: Recent Labs  Lab 03/20/20 0110 03/21/20 0125 03/22/20 0342 03/23/20 0228 03/25/20 0558  WBC 11.5* 10.5 10.9* 8.7 8.6  NEUTROABS 7.1 6.5 6.9 5.0 4.6  HGB 14.2 13.3 14.2 14.3 13.1  HCT 41.2 40.4 42.9 41.8 41.1  MCV 88.2 89.8 90.7 90.5 92.2  PLT 569* 519* 495* 462* 429*    Basic Metabolic Panel: Recent Labs  Lab 03/20/20 0110 03/21/20 0125 03/22/20 0342 03/23/20 0228 03/25/20 0558  NA 137 139 138 139 144  K 4.0 4.1 3.9 3.8 3.6  CL 102 107 104 105 105  CO2 25 24 26 25 27   GLUCOSE 110* 100* 135* 103* 119*  BUN 13 15 14 15 15   CREATININE 0.66 0.60* 0.63 0.64 0.70  CALCIUM 9.0 8.9 8.6* 8.7* 8.7*  MG 2.2 2.1 2.0 2.0  --   PHOS 4.0 3.8 4.2 3.9  --     GFR: Estimated Creatinine Clearance: 143.1 mL/min (by C-G formula based on SCr of 0.7 mg/dL).  Liver Function Tests: Recent Labs  Lab 03/20/20 0110 03/21/20 0125 03/22/20 0342 03/23/20 0228 03/25/20 0558  AST 36 32 32 29 29  ALT 67* 63* 62* 58* 55*  ALKPHOS 175* 162* 161* 160* 145*  BILITOT 0.8 0.6 0.8 0.9 0.5  PROT 7.2 6.5 6.4* 6.3* 6.1*  ALBUMIN 3.3* 3.0* 2.9* 2.9* 2.9*    CBG: Recent Labs  Lab 03/25/20 2009 03/25/20 2354 03/26/20 0439 03/26/20 0742 03/26/20 1148  GLUCAP 88 131* 95 91 122*     Recent Results (from the past 240 hour(s))  MRSA PCR Screening     Status: None   Collection Time:  03/18/20 11:13 AM   Specimen: Nasal Mucosa; Nasopharyngeal  Result Value Ref Range Status   MRSA by PCR NEGATIVE NEGATIVE Final    Comment:        The GeneXpert MRSA Assay (FDA approved for NASAL specimens only), is one component of a comprehensive MRSA colonization surveillance program. It is not intended to diagnose MRSA infection nor to guide or monitor treatment for MRSA infections. Performed at [facility], [place] [address] [place], [place] [address]          Radiology Studies: No results found.      Scheduled Meds: . [START ON 03/27/2020] amLODipine  10 mg Oral Daily  . bethanechol  10 mg Oral TID  . Chlorhexidine Gluconate Cloth  6 each Topical Daily  . [  START ON 03/27/2020] cloNIDine  0.1 mg Oral Daily  . docusate  100 mg Oral BID  . enoxaparin (LOVENOX) injection  40 mg Subcutaneous Q24H  . feeding supplement  237 mL Oral BID BM  . insulin aspart  0-15 Units Subcutaneous Q4H  . lacosamide  100 mg Oral BID  . [START ON 03/27/2020] pantoprazole sodium  40 mg Oral Daily  . PHENObarbital  90 mg Oral BID  . QUEtiapine  200 mg Oral BID   Continuous Infusions: . sodium chloride Stopped (02/26/20 2221)  . sodium chloride Stopped (03/02/20 1734)     LOS: 44 days      Alma Friendly, MD [facility]  03/26/2020, 3:36 PM

## 2020-03-27 DIAGNOSIS — J9600 Acute respiratory failure, unspecified whether with hypoxia or hypercapnia: Secondary | ICD-10-CM | POA: Diagnosis not present

## 2020-03-27 DIAGNOSIS — G9389 Other specified disorders of brain: Secondary | ICD-10-CM | POA: Diagnosis not present

## 2020-03-27 DIAGNOSIS — R4182 Altered mental status, unspecified: Secondary | ICD-10-CM | POA: Diagnosis not present

## 2020-03-27 DIAGNOSIS — G9340 Encephalopathy, unspecified: Secondary | ICD-10-CM | POA: Diagnosis not present

## 2020-03-27 LAB — GLUCOSE, CAPILLARY
Glucose-Capillary: 102 mg/dL — ABNORMAL HIGH (ref 70–99)
Glucose-Capillary: 104 mg/dL — ABNORMAL HIGH (ref 70–99)
Glucose-Capillary: 105 mg/dL — ABNORMAL HIGH (ref 70–99)
Glucose-Capillary: 113 mg/dL — ABNORMAL HIGH (ref 70–99)
Glucose-Capillary: 132 mg/dL — ABNORMAL HIGH (ref 70–99)
Glucose-Capillary: 86 mg/dL (ref 70–99)
Glucose-Capillary: 99 mg/dL (ref 70–99)

## 2020-03-27 NOTE — Progress Notes (Signed)
PROGRESS NOTE    Antonio Glenn  JGO:[id number] DOB: 06/07/1960 DOA: 02/11/2020 PCP: Leonard Downing, MD   No chief complaint on file.  Brief Narrative: Mr. Opdahl is a 60 yo male with PMH of obesity; admitted on11/25/2021, with complaint of seizures and confusion, was found to have brain mass s/p craniotomy on 12/1//2021 of which biopsy showed Grade 4 GBM.  Hospitalist consulted for medical management.  Palliative on board, patient currently a DNR due to very poor prognosis.  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 Poor prognosis and typically average of 2 year survival EEG negative for active seizures Vimpat and phenobarbital for seizures ppx Palliative care on board EEG negative for active seizures.  Dysphagia SLP on board, now recommend dysphagia type I, nectar thick liquids Strict aspiration precautions  Acute respiratory failure with hypoxia, POA secondary to encephalopathy Currently on room air Needed intubation initially, extubated on 12/15  MRSA HAP Resolved Low-grade fever on 12/17, resolved on its own Prior pneumonia, treated with IV vancomycin. Course completed.  Paroxysmal A. Fib Currently rate controlled. Anticoagulation on hold due to CNS mass  Thoracic aortic aneurysm Incidental diagnosis. Repeat  CT scan in 6 months  Hypertension BP stable Continue clonidine, Norvasc, as needed hydralazine  Right upper extremity SVT No evidence of DVT No need for anticoagulation, also not a candidate  Obesity  Goals of care discussion Patient with poor prognosis, encephalopathic (possibly new baseline) Palliative consult appreciated, family decided to switch back to DNR Awaiting placement at SNF        DVT prophylaxis: SCD, lovenox Code Status: DNR Family Communication: None at bedside Disposition:   Status is: Inpatient  Remains inpatient appropriate because:Inpatient level of care appropriate due to severity of illness   Dispo: per primary       Consultants:   Neurosurgery is primary  [facility] consulting   PCCM  Procedures:  12/1 craniotomy  Antimicrobials:  Anti-infectives (From admission, onward)   Start     Dose/Rate Route Frequency Ordered Stop   02/29/20 0200  vancomycin (VANCOREADY) IVPB 1750 mg/350 mL        1,750 mg 175 mL/hr over 120 Minutes Intravenous Every 8 hours 02/28/20 1805 03/02/20 1955   02/25/20 1800  vancomycin (VANCOREADY) IVPB 2000 mg/400 mL  Status:  Discontinued        2,000 mg 200 mL/hr over 120 Minutes Intravenous Every 12 hours 02/25/20 1627 02/28/20 1805   02/17/20 2300  ceFAZolin (ANCEF) IVPB 2g/100 mL premix        2 g 200 mL/hr over 30 Minutes Intravenous Every 8 hours 02/17/20 1918 02/18/20 1507   02/17/20 0500  vancomycin (VANCOREADY) IVPB 1500 mg/300 mL        1,500 mg 150 mL/hr over 120 Minutes Intravenous 120 min pre-op 02/15/20 1325 02/18/20 1128         Subjective: Patient resting comfortably in bed.  Objective: Vitals:   03/27/20 0418 03/27/20 0500 03/27/20 0758 03/27/20 1217  BP: 118/75  (!) 134/92   Pulse: 79  87   Resp: _0 Temp: 98.3 F (36.8 C)  98.1 F (36.7 C) 97.9 F (36.6 C)  TempSrc: Axillary   Oral  SpO2: 99%  98% 97%  Weight:  131.7 kg    Height:        Intake/Output Summary  (Last 24 hours) at 03/27/2020 1409 Last data filed at 03/26/2020 1859 Gross per 24 hour  Intake 340 ml  Output --  Net 340 ml   Filed Weights   03/25/20 0500 03/26/20 0500 03/27/20 0500  Weight: 131.7 kg 130.8 kg 131.7 kg    Examination:  General: NAD  Cardiovascular: S1, S2 present  Respiratory: CTAB  Abdomen: Soft, nontender, nondistended, bowel sounds present  Musculoskeletal: No bilateral pedal edema noted  Skin: Normal  Psychiatry:  Fluctuating mood, intermittently agitated    Data Reviewed: I have personally reviewed following labs and imaging studies  CBC: Recent Labs  Lab 03/21/20 0125 03/22/20 0342 03/23/20 0228 03/25/20 0558  WBC 10.5 10.9* 8.7 8.6  NEUTROABS 6.5 6.9 5.0 4.6  HGB 13.3 14.2 14.3 13.1  HCT 40.4 42.9 41.8 41.1  MCV 89.8 90.7 90.5 92.2  PLT 519* 495* 462* 429*    Basic Metabolic Panel: Recent Labs  Lab 03/21/20 0125 03/22/20 0342 03/23/20 0228 03/25/20 0558  NA 139 138 139 144  K 4.1 3.9 3.8 3.6  CL 107 104 105 105  CO2 _1 GLUCOSE 100* 135* 103* 119*  BUN _2 CREATININE 0.60* 0.63 0.64 0.70  CALCIUM 8.9 8.6* 8.7* 8.7*  MG 2.1 2.0 2.0  --   PHOS 3.8 4.2 3.9  --     GFR: Estimated Creatinine Clearance: 143.6 mL/min (by C-G formula based on SCr of 0.7 mg/dL).  Liver Function Tests: Recent Labs  Lab 03/21/20 0125 03/22/20 0342 03/23/20 0228 03/25/20 0558  AST 32 32 29 29  ALT 63* 62* 58* 55*  ALKPHOS 162* 161* 160* 145*  BILITOT 0.6 0.8 0.9 0.5  PROT 6.5 6.4* 6.3* 6.1*  ALBUMIN 3.0* 2.9* 2.9* 2.9*    CBG: Recent Labs  Lab 03/26/20 1931 03/27/20 0031 03/27/20 0417 03/27/20 0753 03/27/20 1214  GLUCAP 141* 86 99 105* 104*     Recent Results (from the past 240 hour(s))  MRSA PCR Screening     Status: None   Collection Time: 03/18/20 11:13 AM   Specimen: Nasal Mucosa; Nasopharyngeal  Result Value Ref Range Status   MRSA by PCR NEGATIVE NEGATIVE Final    Comment:        The GeneXpert  MRSA Assay (FDA approved for NASAL specimens only), is one component of a comprehensive MRSA colonization surveillance program. It is not intended to diagnose MRSA infection nor to guide or monitor treatment for MRSA infections. Performed at [facility], [place] [address] [place], [place] [address]          Radiology Studies: No results found.      Scheduled Meds: . amLODipine  10 mg Oral Daily  . bethanechol  10 mg Oral TID  . Chlorhexidine Gluconate Cloth  6 each Topical Daily  . cloNIDine  0.1 mg Oral Daily  . docusate  100 mg Oral BID  . enoxaparin (LOVENOX) injection  40 mg Subcutaneous Q24H  . feeding supplement  237 mL Oral BID BM  . insulin  aspart  0-15 Units Subcutaneous Q4H  . lacosamide  100 mg Oral BID  . pantoprazole sodium  40 mg Oral Daily  . PHENObarbital  90 mg Oral BID  . QUEtiapine  200 mg Oral BID   Continuous Infusions: . sodium chloride Stopped (02/26/20 2221)  . sodium chloride Stopped (03/02/20 1734)     LOS: 45 days      Alma Friendly, MD [facility]  03/27/2020, 2:09 PM

## 2020-03-27 NOTE — Progress Notes (Signed)
Subjective: The patient is alert and pleasant.  He is in no apparent distress.  He has no complaints.  Objective: Vital signs in last 24 hours: Temp:  [97.6 F (36.4 C)-98.5 F (36.9 C)] 98.1 F (36.7 C) (01/09 0758) Pulse Rate:  [79-93] 87 (01/09 0758) Resp:  [16-20] 20 (01/09 0758) BP: (118-141)/(75-94) 134/92 (01/09 0758) SpO2:  [98 %-99 %] 98 % (01/09 0758) Weight:  [131.7 kg] 131.7 kg (01/09 0500) Estimated body mass index is 36.29 kg/m as calculated from the following:   Height as of this encounter: 6\' 3"  (1.905 m).   Weight as of this encounter: 131.7 kg.   Intake/Output from previous day: 01/08 0701 - 01/09 0700 In: 800 [P.O.:800] Out: -  Intake/Output this shift: No intake/output data recorded.  Physical exam patient is alert and pleasant.  He is moving all 4 extremities well.  Lab Results: Recent Labs    03/25/20 0558  WBC 8.6  HGB 13.1  HCT 41.1  PLT 429*   BMET Recent Labs    03/25/20 0558  NA 144  K 3.6  CL 105  CO2 27  GLUCOSE 119*  BUN 15  CREATININE 0.70  CALCIUM 8.7*    Studies/Results: No results found.  Assessment/Plan: Postop day #37: The patient is stable.  We are awaiting placement.  LOS: 45 days     Ophelia Charter 03/27/2020, 9:45 AM

## 2020-03-28 ENCOUNTER — Telehealth: Payer: Self-pay

## 2020-03-28 DIAGNOSIS — J9600 Acute respiratory failure, unspecified whether with hypoxia or hypercapnia: Secondary | ICD-10-CM | POA: Diagnosis not present

## 2020-03-28 DIAGNOSIS — G9389 Other specified disorders of brain: Secondary | ICD-10-CM | POA: Diagnosis not present

## 2020-03-28 DIAGNOSIS — G9340 Encephalopathy, unspecified: Secondary | ICD-10-CM | POA: Diagnosis not present

## 2020-03-28 DIAGNOSIS — R4182 Altered mental status, unspecified: Secondary | ICD-10-CM | POA: Diagnosis not present

## 2020-03-28 LAB — COMPREHENSIVE METABOLIC PANEL
ALT: 91 U/L — ABNORMAL HIGH (ref 0–44)
AST: 48 U/L — ABNORMAL HIGH (ref 15–41)
Albumin: 3 g/dL — ABNORMAL LOW (ref 3.5–5.0)
Alkaline Phosphatase: 155 U/L — ABNORMAL HIGH (ref 38–126)
Anion gap: 9 (ref 5–15)
BUN: 13 mg/dL (ref 6–20)
CO2: 26 mmol/L (ref 22–32)
Calcium: 8.9 mg/dL (ref 8.9–10.3)
Chloride: 107 mmol/L (ref 98–111)
Creatinine, Ser: 0.73 mg/dL (ref 0.61–1.24)
GFR, Estimated: >60 mL/min (ref >60)
Glucose, Bld: 110 mg/dL — ABNORMAL HIGH (ref 70–99)
Potassium: 3.8 mmol/L (ref 3.5–5.1)
Sodium: 142 mmol/L (ref 135–145)
Total Bilirubin: 0.7 mg/dL (ref 0.3–1.2)
Total Protein: 6.4 g/dL — ABNORMAL LOW (ref 6.5–8.1)

## 2020-03-28 LAB — GLUCOSE, CAPILLARY
Glucose-Capillary: 99 mg/dL (ref 70–99)
Glucose-Capillary: 102 mg/dL — ABNORMAL HIGH (ref 70–99)
Glucose-Capillary: 123 mg/dL — ABNORMAL HIGH (ref 70–99)
Glucose-Capillary: 103 mg/dL — ABNORMAL HIGH (ref 70–99)
Glucose-Capillary: 129 mg/dL — ABNORMAL HIGH (ref 70–99)

## 2020-03-28 LAB — CBC WITH DIFFERENTIAL/PLATELET
Abs Immature Granulocytes: 0.12 10*3/uL — ABNORMAL HIGH (ref 0.00–0.07)
Basophils Absolute: 0.1 10*3/uL (ref 0.0–0.1)
Basophils Relative: 1 %
Eosinophils Absolute: 0.9 10*3/uL — ABNORMAL HIGH (ref 0.0–0.5)
Eosinophils Relative: 10 %
HCT: 42.4 % (ref 39.0–52.0)
Hemoglobin: 14.2 g/dL (ref 13.0–17.0)
Immature Granulocytes: 1 %
Lymphocytes Relative: 18 %
Lymphs Abs: 1.7 10*3/uL (ref 0.7–4.0)
MCH: 30.1 pg (ref 26.0–34.0)
MCHC: 33.5 g/dL (ref 30.0–36.0)
MCV: 89.8 fL (ref 80.0–100.0)
Monocytes Absolute: 1.1 10*3/uL — ABNORMAL HIGH (ref 0.1–1.0)
Monocytes Relative: 12 %
Neutro Abs: 5.4 10*3/uL (ref 1.7–7.7)
Neutrophils Relative %: 58 %
Platelets: 417 10*3/uL — ABNORMAL HIGH (ref 150–400)
RBC: 4.72 MIL/uL (ref 4.22–5.81)
RDW: 13.5 % (ref 11.5–15.5)
WBC: 9.3 10*3/uL (ref 4.0–10.5)
nRBC: 0 % (ref 0.0–0.2)

## 2020-03-28 NOTE — Progress Notes (Signed)
Physical Therapy Treatment Patient Details Name: Antonio Glenn MRN: [id number] DOB: 09-11-1960 Today's Date: 03/28/2020    History of Present Illness Antonio Glenn is a 60 y.o. male with hx of recently diagnosed brain tumor who was transferred from [facility] ED to [facility] 11/25 with AMS and seizures.CRANIOTOMY OF  LEFT PARIETAL TUMOR 12/1 and on vent, trach off vent 12/15    PT Comments    Pt sitting EOB pleasantly upon arrival, agreeable to session with focus on progressing safety and stability with gait. The pt was able to complete bed mobility and sit-stand transfers with minA and RW to power up and steady. The pt continues to present with poor functional stability and environmental/safety awareness, requiring minA to steady and manage RW with gait, as well as to avoid obstacles while walking. The pt will continue to benefit from skilled PT to progress functional endurance, stability, and awareness to improve safety with mobility and independence.     Follow Up Recommendations  SNF     Equipment Recommendations  Other (comment) (defer to post acute)    Recommendations for Other Services       Precautions / Restrictions Precautions Precautions: Fall Restrictions Weight Bearing Restrictions: No    Mobility  Bed Mobility Overal bed mobility: Needs Assistance Bed Mobility: Supine to Sit;Sit to Supine     Supine to sit: Min guard Sit to supine: Min guard   General bed mobility comments: pt able to complete movements in bed without assist, benefits from cues for repositioning, but able to complete long sit and repositioning physically without assist.  Transfers Overall transfer level: Needs assistance Equipment used: Rolling walker (2 wheeled) Transfers: Sit to/from Stand Sit to Stand: Min assist         General transfer comment: minA to power up from multiple surface.  Ambulation/Gait Ambulation/Gait assistance: Min assist Gait Distance (Feet): 350 Feet Assistive  device: Rolling walker (2 wheeled) Gait Pattern/deviations: Step-through pattern;Decreased stride length;Staggering right;Scissoring;Shuffle;Drifts right/left Gait velocity: decr Gait velocity interpretation: <1.31 ft/sec, indicative of household ambulator General Gait Details: Generally unsteady with constant drifting, running into objects, and needing min/modA to steady and navigate. Pt asked to complete wayfinding, was unable to read or remember his room number. pt fatigues with continued gait, requiring increased assist for stability and cues to attend to task   Modified Rankin (Stroke Patients Only) Modified Rankin (Stroke Patients Only) Pre-Morbid Rankin Score: No symptoms Modified Rankin: Moderately severe disability     Balance Overall balance assessment: Needs assistance Sitting-balance support: No upper extremity supported Sitting balance-Leahy Scale: Fair Sitting balance - Comments: pt able to sustain without UE support   Standing balance support: Bilateral upper extremity supported;During functional activity Standing balance-Leahy Scale: Poor Standing balance comment: reliant on BUE support and external support                            Cognition Arousal/Alertness: Awake/alert Behavior During Therapy: Restless Overall Cognitive Status: Impaired/Different from baseline Area of Impairment: Orientation;Attention;Memory;Following commands;Safety/judgement;Awareness;Problem solving                 Orientation Level: Disoriented to;Time;Place Current Attention Level: Sustained Memory: Decreased short-term memory Following Commands: Follows one step commands with increased time;Follows one step commands inconsistently Safety/Judgement: Decreased awareness of safety;Decreased awareness of deficits Awareness: Intellectual Problem Solving: Slow processing;Decreased initiation;Difficulty sequencing;Requires verbal cues;Requires tactile cues General Comments:  Pt oriented to self and attempting to describe how he came in for  issues with his brain (attempting to describe taking "things" out of his brain through session). Pt able to describe some needs "I want a cold thirsty" or "cold wet" but became increasingly restless when not given water immediately. continued poor problem solving and sequencing      Exercises      General Comments General comments (skin integrity, edema, etc.): VSS through session, pt eager to mobilize, slightly agitated about not having water at end of session      Pertinent Vitals/Pain Pain Assessment: No/denies pain Pain Intervention(s): Monitored during session           PT Goals (current goals can now be found in the care plan section) Acute Rehab PT Goals Patient Stated Goal: to move more PT Goal Formulation: With patient Time For Goal Achievement: 04/04/20 Potential to Achieve Goals: Fair Progress towards PT goals: Progressing toward goals    Frequency    Min 3X/week      PT Plan Current plan remains appropriate    AM-PAC PT "6 Clicks" Mobility   Outcome Measure  Help needed turning from your back to your side while in a flat bed without using bedrails?: A Little Help needed moving from lying on your back to sitting on the side of a flat bed without using bedrails?: A Little Help needed moving to and from a bed to a chair (including a wheelchair)?: A Little Help needed standing up from a chair using your arms (e.g., wheelchair or bedside chair)?: A Lot Help needed to walk in hospital room?: A Lot Help needed climbing 3-5 steps with a railing? : Total 6 Click Score: 14    End of Session Equipment Utilized During Treatment: Gait belt Activity Tolerance: Patient tolerated treatment well Patient left: in bed;with call bell/phone within reach;with bed alarm set Nurse Communication: Mobility status PT Visit Diagnosis: Other abnormalities of gait and mobility (R26.89);Pain Hemiplegia - Right/Left:  Right Hemiplegia - dominant/non-dominant: Dominant Hemiplegia - caused by: Unspecified     Time: 0823-0907 PT Time Calculation (min) (ACUTE ONLY): 44 min  Charges:  $Gait Training: 23-37 mins $Therapeutic Activity: 8-22 mins                     Karma Ganja, PT, DPT   Acute Rehabilitation Department Pager #: [phone]   Otho Bellows 03/28/2020, 12:13 PM

## 2020-03-28 NOTE — Telephone Encounter (Signed)
Mr Nolt daughter called and requested a call back from Dr. Mickeal Skinner to discuss her father;s treatment. Daughter states she has some questions for Dr. Mickeal Skinner.

## 2020-03-28 NOTE — Progress Notes (Signed)
[facility] ([facility]) Community Based Palliative Care  [facility] continues to follow for any discharge planning needs and to coordinate admission onto palliative care as this patient has already been referred to our [place] in the community.  If you have questions or need assistance, please call [phone] or contact the hospital Liaison listed on AMION.  Thank you for the opportunity to participate in this patient's care.  Gar Ponto, RN [facility] Liaison [phone] on call)

## 2020-03-28 NOTE — Progress Notes (Signed)
PROGRESS NOTE    Antonio Glenn  KYH:[id number] DOB: 1960/10/30 DOA: 02/11/2020 PCP: Leonard Downing, MD   No chief complaint on file.  Brief Narrative: Mr. Bovenzi is a 60 yo male with PMH of obesity; admitted on11/25/2021, with complaint of seizures and confusion, was found to have brain mass s/p craniotomy on 12/1//2021 of which biopsy showed Grade 4 GBM.  Hospitalist consulted for medical management.  Palliative on board, patient currently a DNR due to very poor prognosis.  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 Poor prognosis and typically average of 2 year survival EEG negative for active seizures Vimpat and phenobarbital for seizures ppx Palliative care on board EEG negative for active seizures.  Dysphagia SLP on board, now recommend dysphagia type I, nectar thick liquids Strict aspiration precautions  Acute respiratory failure with hypoxia, POA secondary to encephalopathy Currently on room air Needed intubation initially, extubated on 12/15  MRSA HAP Resolved Low-grade fever on 12/17, resolved on its own Prior pneumonia, treated with IV vancomycin. Course completed.  Paroxysmal A. Fib Currently rate controlled. Anticoagulation on hold due to CNS mass  Thoracic aortic aneurysm Incidental diagnosis. Repeat CT scan in 6  months  Hypertension BP stable Continue clonidine, Norvasc, as needed hydralazine  Right upper extremity SVT No evidence of DVT No need for anticoagulation, also not a candidate  Obesity  Goals of care discussion Patient with poor prognosis, encephalopathic (possibly new baseline) Palliative consult appreciated, family decided to switch back to DNR Awaiting placement at SNF        DVT prophylaxis: SCD, lovenox Code Status: DNR Family Communication: None at bedside Disposition:   Status is: Inpatient  Remains inpatient appropriate because:Inpatient level of care appropriate due to severity of illness   Dispo: per primary       Consultants:   Neurosurgery is primary  [facility] consulting   PCCM  Procedures:  12/1 craniotomy  Antimicrobials:  Anti-infectives (From admission, onward)   Start     Dose/Rate Route Frequency Ordered Stop   02/29/20 0200  vancomycin (VANCOREADY) IVPB 1750 mg/350 mL        1,750 mg 175 mL/hr over 120 Minutes Intravenous Every 8 hours 02/28/20 1805 03/02/20 1955   02/25/20 1800  vancomycin (VANCOREADY) IVPB 2000 mg/400 mL  Status:  Discontinued        2,000 mg 200 mL/hr over 120 Minutes Intravenous Every 12 hours 02/25/20 1627 02/28/20 1805   02/17/20 2300  ceFAZolin (ANCEF) IVPB 2g/100 mL premix        2 g 200 mL/hr over 30 Minutes Intravenous Every 8 hours 02/17/20 1918 02/18/20 1507   02/17/20 0500  vancomycin (VANCOREADY) IVPB 1500 mg/300 mL        1,500 mg 150 mL/hr over 120 Minutes Intravenous 120 min pre-op 02/15/20 1325 02/18/20 1128         Subjective: Patient resting comfortably in bed, asking for  cold water   Objective: Vitals:   03/27/20 2345 03/28/20 0419 03/28/20 0733 03/28/20 1134  BP:  (!) 145/89 129/82 (!) 106/56  Pulse: 99 89 86 94  Resp:   15 14  Temp:  97.9 F (36.6 C) 97.8 F (36.6 C) 98.8 F (37.1 C)  TempSrc:  Oral Oral Axillary  SpO2:  99% 99% 95%  Weight:      Height:         Intake/Output Summary (Last 24 hours) at 03/28/2020 1634 Last data filed at 03/28/2020 1403 Gross per 24 hour  Intake 656 ml  Output --  Net 656 ml   Filed Weights   03/25/20 0500 03/26/20 0500 03/27/20 0500  Weight: 131.7 kg 130.8 kg 131.7 kg    Examination:  General: NAD  Cardiovascular: S1, S2 present  Respiratory: CTAB  Abdomen: Soft, nontender, nondistended, bowel sounds present  Musculoskeletal: No bilateral pedal edema noted  Skin: Normal  Psychiatry:  Fluctuating mood, intermittently agitated    Data Reviewed: I have personally reviewed following labs and imaging studies  CBC: Recent Labs  Lab 03/22/20 0342 03/23/20 0228 03/25/20 0558 03/28/20 0410  WBC 10.9* 8.7 8.6 9.3  NEUTROABS 6.9 5.0 4.6 5.4  HGB 14.2 14.3 13.1 14.2  HCT 42.9 41.8 41.1 42.4  MCV 90.7 90.5 92.2 89.8  PLT 495* 462* 429* 417*    Basic Metabolic Panel: Recent Labs  Lab 03/22/20 0342 03/23/20 0228 03/25/20 0558 03/28/20 0410  NA 138 139 144 142  K 3.9 3.8 3.6 3.8  CL 104 105 105 107  CO2 26 25 27 26   GLUCOSE 135* 103* 119* 110*  BUN 14 15 15 13   CREATININE 0.63 0.64 0.70 0.73  CALCIUM 8.6* 8.7* 8.7* 8.9  MG 2.0 2.0  --   --   PHOS 4.2 3.9  --   --     GFR: Estimated Creatinine Clearance: 143.6 mL/min (by C-G formula based on SCr of 0.73 mg/dL).  Liver Function Tests: Recent Labs  Lab 03/22/20 0342 03/23/20 0228 03/25/20 0558 03/28/20 0410  AST 32 29 29 48*  ALT 62* 58* 55* 91*  ALKPHOS 161* 160* 145* 155*  BILITOT 0.8 0.9 0.5 0.7  PROT 6.4* 6.3* 6.1* 6.4*  ALBUMIN 2.9* 2.9* 2.9* 3.0*    CBG: Recent Labs  Lab 03/27/20 2345 03/28/20 0421 03/28/20 0731 03/28/20 1132 03/28/20 1515  GLUCAP 132* 99 102* 123* 103*     No results found for this or any previous visit (from the past 240 hour(s)).       Radiology Studies: No results found.      Scheduled Meds: . amLODipine  10 mg Oral Daily  . bethanechol  10 mg Oral TID  . Chlorhexidine  Gluconate Cloth  6 each Topical Daily  . cloNIDine  0.1 mg Oral Daily  . docusate  100 mg Oral BID  . enoxaparin (LOVENOX) injection  40 mg Subcutaneous Q24H  . feeding supplement  237 mL Oral BID BM  . insulin aspart  0-15 Units Subcutaneous Q4H  . lacosamide  100 mg Oral BID  . pantoprazole sodium  40 mg Oral Daily  . PHENObarbital  90 mg Oral BID  . QUEtiapine  200 mg Oral BID   Continuous Infusions: . sodium chloride Stopped (02/26/20 2221)  . sodium chloride Stopped (03/02/20 1734)     LOS: 64 days      Alma Friendly, MD [facility]  03/28/2020, 4:34 PM

## 2020-03-28 NOTE — Progress Notes (Signed)
Subjective: Patient reports no headaches  Objective: Vital signs in last 24 hours: Temp:  [97.8 F (36.6 C)-99 F (37.2 C)] 97.8 F (36.6 C) (01/10 0733) Pulse Rate:  [83-141] 86 (01/10 0733) Resp:  [15-20] 15 (01/10 0733) BP: (109-145)/(58-102) 129/82 (01/10 0733) SpO2:  [93 %-100 %] 99 % (01/10 0733)  Intake/Output from previous day: 01/09 0701 - 01/10 0700 In: 300 [P.O.:300] Out: 500 [Urine:500] Intake/Output this shift: No intake/output data recorded.  Incision well healed Awake, alert, oriented to person, thought it was December. Meandering unfocused speech FC x 4, no drift. Working with PT  Lab Results: Recent Labs    03/28/20 0410  WBC 9.3  HGB 14.2  HCT 42.4  PLT 417*   BMET Recent Labs    03/28/20 0410  NA 142  K 3.8  CL 107  CO2 26  GLUCOSE 110*  BUN 13  CREATININE 0.73  CALCIUM 8.9    Studies/Results: No results found.  Assessment/Plan: 60 yo M with left parietal GBM - awaiting SNF - discussed his case at interdisciplinary tumor board today.  Dr. Mickeal Skinner to discuss further treatment options with family as outpatient   Antonio Glenn 03/28/2020, 10:54 AM

## 2020-03-29 DIAGNOSIS — R4182 Altered mental status, unspecified: Secondary | ICD-10-CM | POA: Diagnosis not present

## 2020-03-29 DIAGNOSIS — C713 Malignant neoplasm of parietal lobe: Secondary | ICD-10-CM | POA: Diagnosis not present

## 2020-03-29 DIAGNOSIS — J9600 Acute respiratory failure, unspecified whether with hypoxia or hypercapnia: Secondary | ICD-10-CM | POA: Diagnosis not present

## 2020-03-29 DIAGNOSIS — G9389 Other specified disorders of brain: Secondary | ICD-10-CM | POA: Diagnosis not present

## 2020-03-29 DIAGNOSIS — G9340 Encephalopathy, unspecified: Secondary | ICD-10-CM | POA: Diagnosis not present

## 2020-03-29 LAB — GLUCOSE, CAPILLARY
Glucose-Capillary: 106 mg/dL — ABNORMAL HIGH (ref 70–99)
Glucose-Capillary: 114 mg/dL — ABNORMAL HIGH (ref 70–99)
Glucose-Capillary: 115 mg/dL — ABNORMAL HIGH (ref 70–99)
Glucose-Capillary: 117 mg/dL — ABNORMAL HIGH (ref 70–99)
Glucose-Capillary: 136 mg/dL — ABNORMAL HIGH (ref 70–99)
Glucose-Capillary: 97 mg/dL (ref 70–99)
Glucose-Capillary: 97 mg/dL (ref 70–99)

## 2020-03-29 NOTE — Progress Notes (Signed)
Subjective: NAEs o/n  Objective: Vital signs in last 24 hours: Temp:  [98.3 F (36.8 C)-98.7 F (37.1 C)] 98.7 F (37.1 C) (01/11 1156) Pulse Rate:  [80-100] 84 (01/11 1156) Resp:  [15-17] 15 (01/11 1156) BP: (116-136)/(73-103) 116/73 (01/11 1156) SpO2:  [97 %-99 %] 98 % (01/11 1156) Weight:  [129.5 kg] 129.5 kg (01/11 0500)  Intake/Output from previous day: 01/10 0701 - 01/11 0700 In: 506 [P.O.:504] Out: 450 [Urine:450] Intake/Output this shift: Total I/O In: 470 [P.O.:470] Out: -   Resting comfortably, recently examined by Dr. Mickeal Skinner  Lab Results: Recent Labs    03/28/20 0410  WBC 9.3  HGB 14.2  HCT 42.4  PLT 417*   BMET Recent Labs    03/28/20 0410  NA 142  K 3.8  CL 107  CO2 26  GLUCOSE 110*  BUN 13  CREATININE 0.73  CALCIUM 8.9    Studies/Results: No results found.  Assessment/Plan: S/p resection of left parietal GBM - will plan on MRI to help delineate goals of care - still awaiting SNF   LOS: 47 days     Antonio Glenn 03/29/2020, 4:04 PM

## 2020-03-29 NOTE — Progress Notes (Signed)
PROGRESS NOTE    Antonio Glenn  RFX:[id number] DOB: 1960-05-17 DOA: 02/11/2020 PCP: Leonard Downing, MD   No chief complaint on file.  Brief Narrative: Antonio Glenn is a 60 yo male with PMH of obesity; admitted on11/25/2021, with complaint of seizures and confusion, was found to have brain mass s/p craniotomy on 12/1//2021 of which biopsy showed Grade 4 GBM.  Hospitalist consulted for medical management.  Palliative on board, patient currently a DNR due to very poor prognosis.  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 Poor prognosis and typically average of 2 year survival EEG negative for active seizures Vimpat and phenobarbital for seizures ppx Palliative care on board EEG negative for active seizures.  Dysphagia SLP on board, now recommend dysphagia type I, nectar thick liquids Strict aspiration precautions  Acute respiratory failure with hypoxia, POA secondary to encephalopathy Currently on room air Needed intubation initially, extubated on 12/15  MRSA HAP Resolved Low-grade fever on 12/17, resolved on its own Prior pneumonia, treated with IV vancomycin. Course completed.  Paroxysmal A. Fib Currently rate controlled. Anticoagulation on hold due to CNS mass  Thoracic aortic aneurysm Incidental diagnosis. Repeat CT scan in 6  months  Hypertension BP stable Continue clonidine, Norvasc, as needed hydralazine  Right upper extremity SVT No evidence of DVT No need for anticoagulation, also not a candidate  Obesity  Goals of care discussion Patient with poor prognosis, encephalopathic (possibly new baseline) Palliative consult appreciated, family decided to switch back to DNR Awaiting placement at SNF        DVT prophylaxis: SCD, lovenox Code Status: DNR Family Communication: None at bedside Disposition:   Status is: Inpatient  Remains inpatient appropriate because:Inpatient level of care appropriate due to severity of illness   Dispo: per primary       Consultants:   Neurosurgery is primary  [facility] consulting   PCCM  Procedures:  12/1 craniotomy  Antimicrobials:  Anti-infectives (From admission, onward)   Start     Dose/Rate Route Frequency Ordered Stop   02/29/20 0200  vancomycin (VANCOREADY) IVPB 1750 mg/350 mL        1,750 mg 175 mL/hr over 120 Minutes Intravenous Every 8 hours 02/28/20 1805 03/02/20 1955   02/25/20 1800  vancomycin (VANCOREADY) IVPB 2000 mg/400 mL  Status:  Discontinued        2,000 mg 200 mL/hr over 120 Minutes Intravenous Every 12 hours 02/25/20 1627 02/28/20 1805   02/17/20 2300  ceFAZolin (ANCEF) IVPB 2g/100 mL premix        2 g 200 mL/hr over 30 Minutes Intravenous Every 8 hours 02/17/20 1918 02/18/20 1507   02/17/20 0500  vancomycin (VANCOREADY) IVPB 1500 mg/300 mL        1,500 mg 150 mL/hr over 120 Minutes Intravenous 120 min pre-op 02/15/20 1325 02/18/20 1128         Subjective: Patient denies any new complaints.  Objective: Vitals:   03/29/20 0421 03/29/20 0500 03/29/20 0735 03/29/20 1156  BP: (!) 129/103  (!) 129/99 116/73  Pulse: 90  80 84  Resp:   17 15  Temp:   98.4 F (36.9 C) 98.7 F (37.1 C)  TempSrc:   Oral Oral  SpO2:   99% 98%  Weight:  129.5 kg    Height:        Intake/Output Summary (Last 24 hours) at 03/29/2020  1428 Last data filed at 03/29/2020 0900 Gross per 24 hour  Intake 384 ml  Output 450 ml  Net -66 ml   Filed Weights   03/26/20 0500 03/27/20 0500 03/29/20 0500  Weight: 130.8 kg 131.7 kg 129.5 kg    Examination:  General: NAD, awake, alert  Cardiovascular: S1, S2 present  Respiratory: CTAB  Abdomen: Soft, nontender, nondistended, bowel sounds present  Musculoskeletal: No bilateral pedal edema noted  Skin: Normal  Psychiatry:  Fluctuating mood, intermittently agitated    Data Reviewed: I have personally reviewed following labs and imaging studies  CBC: Recent Labs  Lab 03/23/20 0228 03/25/20 0558 03/28/20 0410  WBC 8.7 8.6 9.3  NEUTROABS 5.0 4.6 5.4  HGB 14.3 13.1 14.2  HCT 41.8 41.1 42.4  MCV 90.5 92.2 89.8  PLT 462* 429* 417*    Basic Metabolic Panel: Recent Labs  Lab 03/23/20 0228 03/25/20 0558 03/28/20 0410  NA 139 144 142  K 3.8 3.6 3.8  CL 105 105 107  CO2 25 27 26   GLUCOSE 103* 119* 110*  BUN 15 15 13   CREATININE 0.64 0.70 0.73  CALCIUM 8.7* 8.7* 8.9  MG 2.0  --   --   PHOS 3.9  --   --     GFR: Estimated Creatinine Clearance: 142.4 mL/min (by C-G formula based on SCr of 0.73 mg/dL).  Liver Function Tests: Recent Labs  Lab 03/23/20 0228 03/25/20 0558 03/28/20 0410  AST 29 29 48*  ALT 58* 55* 91*  ALKPHOS 160* 145* 155*  BILITOT 0.9 0.5 0.7  PROT 6.3* 6.1* 6.4*  ALBUMIN 2.9* 2.9* 3.0*    CBG: Recent Labs  Lab 03/28/20 1907 03/29/20 0040 03/29/20 0421 03/29/20 0732 03/29/20 1155  GLUCAP 129* 97 106* 97 114*     No results found for this or any previous visit (from the past 240 hour(s)).       Radiology Studies: No results found.      Scheduled Meds: . amLODipine  10 mg Oral Daily  . bethanechol  10 mg Oral TID  . Chlorhexidine Gluconate Cloth  6 each Topical Daily  . cloNIDine  0.1 mg Oral Daily  . docusate  100 mg Oral BID  . enoxaparin (LOVENOX) injection  40 mg Subcutaneous Q24H  . feeding  supplement  237 mL Oral BID BM  . insulin aspart  0-15 Units Subcutaneous Q4H  . lacosamide  100 mg Oral BID  . pantoprazole sodium  40 mg Oral Daily  . PHENObarbital  90 mg Oral BID  . QUEtiapine  200 mg Oral BID   Continuous Infusions: . sodium chloride Stopped (02/26/20 2221)  . sodium chloride Stopped (03/02/20 1734)     LOS: 87 days      Alma Friendly, MD [facility]  03/29/2020, 2:28 PM

## 2020-03-29 NOTE — Progress Notes (Signed)
  Speech Language Pathology Treatment: Dysphagia;Cognitive-Linquistic  Patient Details Name: Antonio Glenn MRN: [id number] DOB: [birth year] Today's Date: 03/29/2020 Time: 1007-0127 SLP Time Calculation (min) (ACUTE ONLY): 920 min  Assessment / Plan / Recommendation Clinical Impression  Antonio Glenn trialed solid textures and demonstrated efficient mastication, without residue and did not vocalize while manipulating. Needed tactile cues for smaller sips as he continues to exhibit impulsivity with eating/drinking.  When therapist repeated back pt's paraphasic utterances, he is not aware and does not recognize the errors. Pt picked up call bell trying to figure out buttons. Max cues and repetition for correct icon however he affirms visual disturbance preventing from seeing as well as comprehension impairments. He was unable to identify red, nurse call bell at end of session. Continued therapy for basic communication/cognition in the acute environment.    HPI HPI: Antonio Glenn is a 60 y.o. male with hx of recently diagnosed brain tumor (left parietal lobe) who was transferred from [place] ED to [facility] 11/25 with AMS and seizures. Underwent resection 12/1. Intubated 12/1-12/15. Had FEES 12/21 with recomendation for NPO with occasional tsp honey thick water. MBS 12/28 revealed improvements with recs for dysphagia 1, nectar thick liquids.      SLP Plan  Continue with current plan of care       Recommendations  Diet recommendations: Dysphagia 3 (mechanical soft);Nectar-thick liquid Liquids provided via: Cup;Straw Medication Administration: Crushed with puree Supervision: Staff to assist with self feeding;Full supervision/cueing for compensatory strategies Compensations: Slow rate;Small sips/bites;Minimize environmental distractions Postural Changes and/or Swallow Maneuvers: Seated upright 90 degrees                Oral Care Recommendations: Oral care BID Follow up Recommendations: Skilled  Nursing facility SLP Visit Diagnosis: Dysphagia, oropharyngeal phase (R13.12);Cognitive communication deficit (G92.119) Plan: Continue with current plan of care       Antonio Glenn, Antonio Glenn 03/29/2020, 12:20 PM

## 2020-03-29 NOTE — Progress Notes (Signed)
Neuro-Oncology Progress Note  Antonio Glenn has no complaints today.  He is awake and interactive, moving about the bed.  Subjective history is limited by patient's language impairment  Vitals:   03/29/20 0421 03/29/20 0500 03/29/20 0735 03/29/20 1156  BP: (!) 129/103  (!) 129/99 116/73  Pulse: 90  80 84  Resp:   17 15  Temp:   98.4 F (36.9 C) 98.7 F (37.1 C)  TempSrc:   Oral Oral  SpO2:   99% 98%  Weight:  285 lb 7.9 oz (129.5 kg)    Height:       Awake, alert, somewhat attentive to examiner.  Mixed dysphasia primarily receptive.  Follows some simple lateralized commands and occasionally delivers coherent sentences.  Frequent paraphasic semantic errors.  Poor insight into detailed nature of his condition. EOMI, face symmetric Both arms and legs move against gravity without resistance Sensory intact Gait deferred Non labored breathing Abdomen non-distended   Assessment and Plan  Left Parietal Glioblastoma  Antonio Glenn demonstrates improvement in arousal, mentation and motor function compared to prior evaluation.  He appears to be at or near a new baseline, which is primarily limited by receptive type dysphasia.  He has been up walking with physical therapy.  I spoke to his daughter, Antonio Glenn, after visiting with Mr. Shen.  She understands that communication and decision making will need to go through her because of his dense language impairment.    We again discussed his prognosis, and treatment options moving forward for his GBM.  We do think he would theoretically be a candidate for radiation and/or chemotherapy based on his improvements in recent days.  These treatments would offer prolongation of lifespan, but not cure.    Because of long duration since craniotomy, we do think it would be helpful to obtain a repeat MRI brain with and w/o contrast, if possible.  If resection cavity remains stable, candidacy for further therapy (radiation, chemo) would be stronger.  If  tumor is already aggressively recurrent, that might push [place] towards more palliative measures.    One question for social work and case management team would be investigating logistics of patient transport from rehab or SNF facility to radiation therapy treatments, if we move forward.  Treatments would be daily, Monday through Friday, for 6 weeks.   We appreciate the continued involvement of primary team and palliative care.    We will continue to follow along with this case, and are happy to receive questions, updates from team members or patient family as discussed.  We will let the radiation team know about upcoming scan and potential plans for initiation of treatment for glioblastoma.    Ventura Sellers, MD

## 2020-03-30 ENCOUNTER — Inpatient Hospital Stay (HOSPITAL_COMMUNITY): Payer: BC Managed Care – PPO

## 2020-03-30 LAB — GLUCOSE, CAPILLARY
Glucose-Capillary: 94 mg/dL (ref 70–99)
Glucose-Capillary: 102 mg/dL — ABNORMAL HIGH (ref 70–99)
Glucose-Capillary: 100 mg/dL — ABNORMAL HIGH (ref 70–99)
Glucose-Capillary: 100 mg/dL — ABNORMAL HIGH (ref 70–99)
Glucose-Capillary: 120 mg/dL — ABNORMAL HIGH (ref 70–99)
Glucose-Capillary: 78 mg/dL (ref 70–99)
Glucose-Capillary: 120 mg/dL — ABNORMAL HIGH (ref 70–99)

## 2020-03-30 MED ORDER — ONDANSETRON HCL 4 MG/2ML IJ SOLN: 4.0000 mg | INTRAMUSCULAR | Status: DC | PRN

## 2020-03-30 MED ORDER — DIAZEPAM 5 MG PO TABS
5.0000 mg | ORAL_TABLET | Freq: Two times a day (BID) | ORAL | Status: DC | PRN
  Administered 2020-03-30: 5 mg via ORAL
  Filled 2020-03-30: qty 1

## 2020-03-30 MED ORDER — ACETAMINOPHEN 650 MG RE SUPP: 650.0000 mg | RECTAL | Status: DC | PRN

## 2020-03-30 MED ORDER — GADOBUTROL 1 MMOL/ML IV SOLN
10.0000 mL | Freq: Once | INTRAVENOUS | Status: AC | PRN
  Administered 2020-03-30: 10 mL via INTRAVENOUS

## 2020-03-30 MED ORDER — ACETAMINOPHEN 325 MG PO TABS
650.0000 mg | ORAL_TABLET | ORAL | Status: DC | PRN
  Administered 2020-03-31 – 2020-04-10 (×2): 650 mg via ORAL
  Filled 2020-03-30 (×2): qty 2

## 2020-03-30 MED ORDER — OXYCODONE HCL 5 MG PO TABS
5.0000 mg | ORAL_TABLET | Freq: Four times a day (QID) | ORAL | Status: DC | PRN
  Administered 2020-03-30 – 2020-04-07 (×6): 10 mg via ORAL
  Administered 2020-04-08: 5 mg via ORAL
  Filled 2020-03-30 (×2): qty 2
  Filled 2020-03-30: qty 1
  Filled 2020-03-30 (×5): qty 2

## 2020-03-30 MED ORDER — ONDANSETRON HCL 4 MG PO TABS: 4.0000 mg | ORAL_TABLET | ORAL | Status: DC | PRN

## 2020-03-30 MED ORDER — POLYETHYLENE GLYCOL 3350 17 G PO PACK: 17.0000 g | PACK | Freq: Every day | ORAL | Status: DC | PRN

## 2020-03-30 MED ORDER — PROMETHAZINE HCL 25 MG PO TABS: 12.5000 mg | ORAL_TABLET | ORAL | Status: DC | PRN

## 2020-03-30 NOTE — TOC Progression Note (Deleted)
Transition of Care [facility]) - Progression Note    Patient Details  Name: Antonio Glenn MRN: [id number] Date of Birth: 04-13-1960  Transition of Care [facility]) CM/SW [place], [place] Phone Number: 03/30/2020, 4:08 PM  Clinical Narrative:     CSW called [place] [phone]- left voice message w/CAP worker(Donita Kenton Kingfisher) to contact CSW.  Thurmond Butts, MSW, LCSW Clinical Social Worker   Expected Discharge Plan: Skilled Nursing Facility Barriers to Discharge: SNF Pending bed offer  Expected Discharge Plan and Services Expected Discharge Plan: [place]   Discharge Planning Services: CM Consult Post Acute Care Choice: [place] Living arrangements for the past 2 months: Single Family Home                                       Social Determinants of Health (SDOH) Interventions    Readmission Risk Interventions No flowsheet data found.

## 2020-03-30 NOTE — Progress Notes (Signed)
Patient ID: Antonio Glenn, male   DOB: [birth year], 60 y.o.   MRN: [id number]  PROGRESS NOTE    Antonio Glenn  HFW:[id number] DOB: 05-28-1960 DOA: 02/11/2020 PCP: Leonard Downing, MD   Brief Narrative:  60 yo male with PMH of obesity; admitted on11/25/2021, with complaint of seizures and confusion, was found to have brain mass s/p craniotomy on 12/1//2021 of which biopsy showed Grade 4 GBM.  Hospitalist consulted for medical management.  Palliative on board, patient currently a DNR due to very poor prognosis.  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 Poor prognosis and typically average of 2 year survival.  Neuro oncology following. -EEG negative for active seizures -Vimpat and phenobarbital for seizures ppx -Palliative care on board  Dysphagia -SLP on board, now recommend dysphagia type I, nectar thick liquids -Strict aspiration precautions  Acute respiratory failure with hypoxia, POA secondary to encephalopathy -Currently on room air -Needed intubation initially, extubated on 12/15  MRSA HAP -Resolved -Low-grade fever on 12/17, resolved on its own -Prior pneumonia, treated with IV vancomycin. -Antibiotic course completed.  Paroxysmal A. Fib -Currently rate controlled. -Anticoagulation on hold due to CNS mass  Thoracic aortic aneurysm -incidental diagnosis. -Repeat CT scan in 6 months  Hypertension -BP stable -Continue clonidine, Norvasc, as needed hydralazine  Right upper extremity superficial venous thrombosis -No evidence of DVT -No need for anticoagulation, also not a  candidate  Obesity -Outpatient follow-up  Goals of care discussion -Patient with poor prognosis, encephalopathic (possibly new baseline) -Palliative consult appreciated, family decided to switch back to DNR.  Will need outpatient palliative care follow-up -Awaiting placement at SNF  Subjective: Patient seen and examined at bedside.  Awake, poor historian and looks confused.  No overnight fever, vomiting, seizures reported.  Objective: Vitals:   03/29/20 2309 03/30/20 0405 03/30/20 0429 03/30/20 0749  BP: (!) 147/92 124/84  (!) 155/109  Pulse: 89 80  88  Resp:      Temp: 97.9 F (36.6 C)   98.2 F (36.8 C)  TempSrc:    Oral  SpO2: (!) 86%     Weight:   129.1 kg   Height:        Intake/Output Summary (Last 24 hours) at 03/30/2020 0930 Last data filed at 03/30/2020 0700 Gross per 24 hour  Intake 796 ml  Output 1075 ml  Net -279 ml   Filed Weights   03/27/20 0500 03/29/20 0500 03/30/20 0429  Weight: 131.7 kg 129.5 kg 129.1 kg    Examination:  General exam: Appears calm and comfortable.  Awake, confused. Respiratory system: Bilateral decreased breath sounds at bases with some scattered crackles Cardiovascular system: S1 & S2 heard, Rate controlled Gastrointestinal system: Abdomen is nondistended, soft and nontender. Normal bowel sounds heard. Extremities: No cyanosis, clubbing; trace lower extremity edema  Central nervous system: Awake, confused, poor historian. No focal neurological deficits. Moving extremities Skin: No rashes, lesions or ulcers Psychiatry: Could not be assessed because of mental status    Data Reviewed: I have personally reviewed following labs and imaging studies  CBC: Recent Labs  Lab 03/25/20 0558 03/28/20 0410  WBC 8.6 9.3  NEUTROABS 4.6 5.4  HGB 13.1 14.2  HCT  41.1 42.4  MCV 92.2 89.8  PLT 429* 917*   Basic Metabolic Panel: Recent Labs  Lab 03/25/20 0558 03/28/20 0410  NA 144 142  K 3.6 3.8  CL 105 107  CO2 27 26   GLUCOSE 119* 110*  BUN 15 13  CREATININE 0.70 0.73  CALCIUM 8.7* 8.9   GFR: Estimated Creatinine Clearance: 142.1 mL/min (by C-G formula based on SCr of 0.73 mg/dL). Liver Function Tests: Recent Labs  Lab 03/25/20 0558 03/28/20 0410  AST 29 48*  ALT 55* 91*  ALKPHOS 145* 155*  BILITOT 0.5 0.7  PROT 6.1* 6.4*  ALBUMIN 2.9* 3.0*   No results for input(s): LIPASE, AMYLASE in the last 168 hours. No results for input(s): AMMONIA in the last 168 hours. Coagulation Profile: No results for input(s): INR, PROTIME in the last 168 hours. Cardiac Enzymes: No results for input(s): CKTOTAL, CKMB, CKMBINDEX, TROPONINI in the last 168 hours. BNP (last 3 results) No results for input(s): PROBNP in the last 8760 hours. HbA1C: No results for input(s): HGBA1C in the last 72 hours. CBG: Recent Labs  Lab 03/29/20 1657 03/29/20 2004 03/29/20 2308 03/30/20 0406 03/30/20 0749  GLUCAP 136* 115* 117* 94 102*   Lipid Profile: No results for input(s): CHOL, HDL, LDLCALC, TRIG, CHOLHDL, LDLDIRECT in the last 72 hours. Thyroid Function Tests: No results for input(s): TSH, T4TOTAL, FREET4, T3FREE, THYROIDAB in the last 72 hours. Anemia Panel: No results for input(s): VITAMINB12, FOLATE, FERRITIN, TIBC, IRON, RETICCTPCT in the last 72 hours. Sepsis Labs: No results for input(s): PROCALCITON, LATICACIDVEN in the last 168 hours.  No results found for this or any previous visit (from the past 240 hour(s)).       Radiology Studies: No results found.      Scheduled Meds: . amLODipine  10 mg Oral Daily  . bethanechol  10 mg Oral TID  . Chlorhexidine Gluconate Cloth  6 each Topical Daily  . cloNIDine  0.1 mg Oral Daily  . docusate  100 mg Oral BID  . enoxaparin (LOVENOX) injection  40 mg Subcutaneous Q24H  . feeding supplement  237 mL Oral BID BM  . insulin aspart  0-15 Units Subcutaneous Q4H  . lacosamide  100 mg Oral BID  . pantoprazole sodium  40 mg Oral Daily  . PHENObarbital   90 mg Oral BID  . QUEtiapine  200 mg Oral BID   Continuous Infusions: . sodium chloride Stopped (02/26/20 2221)  . sodium chloride Stopped (03/02/20 1734)          Aline August, MD [facility] 03/30/2020, 9:30 AM

## 2020-03-30 NOTE — Progress Notes (Signed)
PT Cancellation Note  Patient Details Name: Antonio Glenn MRN: [id number] DOB: 04-Feb-1961   Cancelled Treatment:    Reason Eval/Treat Not Completed: Other (comment). Attempted at 11am, pt off floor at MRI. Attempted at 12 and pt sound asleep. Per RN pt very agitated and combative upon return. RN gave patient meds and asked [place] to let him sleep as he didn't sleep at all last night. Acute PT to return as able.  Kittie Plater, PT, DPT Acute Rehabilitation Services Pager #: [phone] Office #: [phone]    Berline Lopes 03/30/2020, 12:42 PM

## 2020-03-30 NOTE — TOC Progression Note (Signed)
Transition of Care [facility]) - Progression Note    Patient Details  Name: Antonio Glenn MRN: [id number] Date of Birth: 1960-04-29  Transition of Care [facility]) CM/SW [place], [place] Phone Number: 03/30/2020, 9:02 AM  Clinical Narrative:      Patient only had two bed offers- however,  [facility] to admit because of covid [place] Unable to admit because of covid & they can not take unvaccinated patient at this time.  CSW will continue to seek placement   Thurmond Butts, MSW, LCSW Clinical Social Worker   Expected Discharge Plan: Skilled Nursing Facility Barriers to Discharge: SNF Pending bed offer  Expected Discharge Plan and Services Expected Discharge Plan: [place]   Discharge Planning Services: CM Consult Post Acute Care Choice: [place] Living arrangements for the past 2 months: Single Family Home                                       Social Determinants of Health (SDOH) Interventions    Readmission Risk Interventions No flowsheet data found.

## 2020-03-30 NOTE — TOC Progression Note (Signed)
Transition of Care [facility]) - Progression Note    Patient Details  Name: Antonio Glenn MRN: [id number] Date of Birth: Oct 11, 1960  Transition of Care [facility]) CM/SW [place], [place] Phone Number: 03/30/2020, 1:42 PM  Clinical Narrative:     [facility] to accept unvaccinated patients [place]-admission on hold [facility]- admission on hold  CSW will continue to follow and assist with discharge planning.  Thurmond Butts, MSW, LCSW Clinical Social Worker   Expected Discharge Plan: Skilled Nursing Facility Barriers to Discharge: SNF Pending bed offer  Expected Discharge Plan and Services Expected Discharge Plan: [place]   Discharge Planning Services: CM Consult Post Acute Care Choice: [place] Living arrangements for the past 2 months: Single Family Home                                       Social Determinants of Health (SDOH) Interventions    Readmission Risk Interventions No flowsheet data found.

## 2020-03-30 NOTE — Progress Notes (Signed)
Subjective: Pt reports no HA  Objective: Vital signs in last 24 hours: Temp:  [97.9 F (36.6 C)-98.7 F (37.1 C)] 98.2 F (36.8 C) (01/12 0749) Pulse Rate:  [62-89] 88 (01/12 0749) Resp:  [15-16] 16 (01/11 1700) BP: (116-155)/(73-109) 155/109 (01/12 0749) SpO2:  [86 %-98 %] 86 % (01/11 2309) Weight:  [129.1 kg] 129.1 kg (01/12 0429)  Intake/Output from previous day: 01/11 0701 - 01/12 0700 In: 1030 [P.O.:1030] Out: 1075 [Urine:1075] Intake/Output this shift: No intake/output data recorded.  Awake, alert, pleasant.  Speech with some word finding difficulties and semantic paraphasias, though fluent with simple conversation. FC x 4  Lab Results: Recent Labs    03/28/20 0410  WBC 9.3  HGB 14.2  HCT 42.4  PLT 417*   BMET Recent Labs    03/28/20 0410  NA 142  K 3.8  CL 107  CO2 26  GLUCOSE 110*  BUN 13  CREATININE 0.73  CALCIUM 8.9    Studies/Results: No results found.  Assessment/Plan: S/p resection of left parietal GBM - awaiting SNF - Dr. Mickeal Skinner has ordered another MRI brain to see if he is a suitable candidate for further onc treatments   LOS: 84 days     [name] 03/30/2020, 9:51 AM

## 2020-03-30 NOTE — Progress Notes (Signed)
Nutrition Follow-up  DOCUMENTATION CODES:   Obesity unspecified  INTERVENTION:  Continue Ensure Enlive po BID (thickened to appropriate consistency), each supplement provides 350 kcal and 20 grams of protein  Encourage adequate PO intake.   NUTRITION DIAGNOSIS:   Inadequate oral intake related to lethargy/confusion as evidenced by NPO status; diet advanced; progressing  GOAL:   Patient will meet greater than or equal to 90% of their needs; progressing  MONITOR:   PO intake,Skin,Supplement acceptance,Weight trends,Labs,I & O's  REASON FOR ASSESSMENT:   Consult Enteral/tube feeding initiation and management  ASSESSMENT:   60 year old male who presented on 11/25 with seizure-like activity. Pt was recently found to have brain mass concerning for neoplasm with plan for left craniotomy on 12/01.  11/29- Cortrak placed,later pt pulled tube out 12/01-s/p crani of L parietal tumor  12/03- Cortrak placed,tip gastric 12/13-pt vomited, Cortrak repositionedto post-pyloric (4th portion of duodenum) 12/15-extubated 12/20 - diet advanced to dysphagia 1 with nectar-thick liquids 12/21 - FEES with recommendations for NPO with honey-thick water only 12/28 - Diet advanced    Pt is currently on a dysphagia 3 diet with nectar thick liquids. Meal completion has been 50-100%. Pt currently has Ensure ordered with varied consumption. RD to continue with current orders to aid in caloric and protein needs. Awaiting SNF placement.   Labs and medications reviewed.   Diet Order:   Diet Order            DIET DYS 3 Room service appropriate? No; Fluid consistency: Nectar Thick  Diet effective now                 EDUCATION NEEDS:   No education needs have been identified at this time  Skin:  Skin Assessment: Reviewed RN Assessment Skin Integrity Issues:: Incisions Incisions: head  Last BM:  1/10  Height:   Ht Readings from Last 1 Encounters:  02/17/20 6\' 3"  (1.905 m)     Weight:   Wt Readings from Last 1 Encounters:  03/30/20 129.1 kg    BMI:  Body mass index is 35.57 kg/m.  Estimated Nutritional Needs:   Kcal:  4098-1191  Protein:  120-145 grams  Fluid:  >/= 2.0 L  Corrin Parker, MS, RD, LDN RD pager number/after hours weekend pager number on Amion.

## 2020-03-31 LAB — CBC WITH DIFFERENTIAL/PLATELET
Abs Immature Granulocytes: 0.08 10*3/uL — ABNORMAL HIGH (ref 0.00–0.07)
Basophils Absolute: 0.1 10*3/uL (ref 0.0–0.1)
Basophils Relative: 1 %
Eosinophils Absolute: 1 10*3/uL — ABNORMAL HIGH (ref 0.0–0.5)
Eosinophils Relative: 12 %
HCT: 42.3 % (ref 39.0–52.0)
Hemoglobin: 14.3 g/dL (ref 13.0–17.0)
Immature Granulocytes: 1 %
Lymphocytes Relative: 18 %
Lymphs Abs: 1.5 10*3/uL (ref 0.7–4.0)
MCH: 30.3 pg (ref 26.0–34.0)
MCHC: 33.8 g/dL (ref 30.0–36.0)
MCV: 89.6 fL (ref 80.0–100.0)
Monocytes Absolute: 1 10*3/uL (ref 0.1–1.0)
Monocytes Relative: 11 %
Neutro Abs: 4.7 10*3/uL (ref 1.7–7.7)
Neutrophils Relative %: 57 %
Platelets: 364 10*3/uL (ref 150–400)
RBC: 4.72 MIL/uL (ref 4.22–5.81)
RDW: 13.3 % (ref 11.5–15.5)
WBC: 8.3 10*3/uL (ref 4.0–10.5)
nRBC: 0 % (ref 0.0–0.2)

## 2020-03-31 LAB — COMPREHENSIVE METABOLIC PANEL
ALT: 61 U/L — ABNORMAL HIGH (ref 0–44)
AST: 30 U/L (ref 15–41)
Albumin: 3.1 g/dL — ABNORMAL LOW (ref 3.5–5.0)
Alkaline Phosphatase: 152 U/L — ABNORMAL HIGH (ref 38–126)
Anion gap: 12 (ref 5–15)
BUN: 8 mg/dL (ref 6–20)
CO2: 23 mmol/L (ref 22–32)
Calcium: 8.7 mg/dL — ABNORMAL LOW (ref 8.9–10.3)
Chloride: 105 mmol/L (ref 98–111)
Creatinine, Ser: 0.62 mg/dL (ref 0.61–1.24)
GFR, Estimated: >60 mL/min (ref >60)
Glucose, Bld: 110 mg/dL — ABNORMAL HIGH (ref 70–99)
Potassium: 3.8 mmol/L (ref 3.5–5.1)
Sodium: 140 mmol/L (ref 135–145)
Total Bilirubin: 0.6 mg/dL (ref 0.3–1.2)
Total Protein: 6 g/dL — ABNORMAL LOW (ref 6.5–8.1)

## 2020-03-31 LAB — GLUCOSE, CAPILLARY
Glucose-Capillary: 67 mg/dL — ABNORMAL LOW (ref 70–99)
Glucose-Capillary: 113 mg/dL — ABNORMAL HIGH (ref 70–99)
Glucose-Capillary: 90 mg/dL (ref 70–99)
Glucose-Capillary: 112 mg/dL — ABNORMAL HIGH (ref 70–99)
Glucose-Capillary: 102 mg/dL — ABNORMAL HIGH (ref 70–99)
Glucose-Capillary: 97 mg/dL (ref 70–99)

## 2020-03-31 NOTE — Progress Notes (Addendum)
During shift change pt attempted to get out of bed several times. PRN Haldol administered. PRN medication not effective pt still attempting to get out of bed and hitting RN. On call provider notified. Soft belt restraint ordered and place.   Throughout night pt continues to hit and kick staff. PRN Haldol administered.   Daughter called and notified of restraint placement.

## 2020-03-31 NOTE — Progress Notes (Signed)
Patient ID: Antonio Glenn, male   DOB: 28-Feb-1961, 60 y.o.   MRN: [id number]  PROGRESS NOTE    Antonio Glenn  OZH:[id number] DOB: Apr 19, 1960 DOA: 02/11/2020 PCP: Leonard Downing, MD   Brief Narrative:  60 yo male with PMH of obesity; admitted on11/25/2021, with complaint of seizures and confusion, was found to have brain mass s/p craniotomy on 12/1//2021 of which biopsy showed Grade 4 GBM.  Hospitalist consulted for medical management.  Palliative on board, patient currently a DNR due to very poor prognosis.  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 Poor prognosis and typically average of 2 year survival.  Neuro oncology following. -EEG negative for active seizures -Vimpat and phenobarbital for seizures ppx -Palliative care on board  Dysphagia -SLP on board, now recommend dysphagia type I, nectar thick liquids -Strict aspiration precautions  Acute respiratory failure with hypoxia, POA secondary to encephalopathy -Currently on room air -Needed intubation initially, extubated on 12/15  MRSA HAP -Resolved -Low-grade fever on 12/17, resolved on its own -Prior pneumonia, treated with IV vancomycin. -Antibiotic course completed. -Currently afebrile.  Paroxysmal A. Fib -Currently rate controlled. -Anticoagulation on hold due to CNS mass  Thoracic aortic aneurysm -incidental diagnosis. -Repeat CT scan in 6 months  Hypertension -BP on the higher side intermittently -Continue clonidine, Norvasc, as needed hydralazine  Right upper extremity superficial venous thrombosis -No evidence of DVT -No  need for anticoagulation, also not a candidate  Obesity -Outpatient follow-up  Goals of care discussion -Patient with poor prognosis, encephalopathic (possibly new baseline) -Palliative consult appreciated, family decided to switch back to DNR.  Will need outpatient palliative care follow-up -Awaiting placement at SNF  Subjective: Patient seen and examined at bedside.  Poor historian.  Awake but looks confused and slow to respond.  No overnight fever, seizures, vomiting reported. Objective: Vitals:   03/30/20 2000 03/30/20 2015 03/31/20 0000 03/31/20 0400  BP:  130/64 (!) 140/91 (!) 146/97  Pulse:  85 91 78  Resp:  17 18 (!) 21  Temp: 99 F (37.2 C) 98.5 F (36.9 C) 99 F (37.2 C) 98.5 F (36.9 C)  TempSrc: Oral Oral Oral Oral  SpO2:  97% 98% 99%  Weight:      Height:        Intake/Output Summary (Last 24 hours) at 03/31/2020 0724 Last data filed at 03/30/2020 2000 Gross per 24 hour  Intake --  Output 500 ml  Net -500 ml   Filed Weights   03/27/20 0500 03/29/20 0500 03/30/20 0429  Weight: 131.7 kg 129.5 kg 129.1 kg    Examination:  General exam: No acute distress.  Looks slightly confused but awake Respiratory system: Decreased breath sounds at bases bilaterally, no wheezing  cardiovascular system: Rate controlled, S1-S2 heard Gastrointestinal system: Abdomen is nondistended, soft and nontender.  Bowel sounds heard  extremities: Mild lower extremity edema present.  No clubbing   Data Reviewed: I have personally reviewed following labs and imaging studies  CBC: Recent Labs  Lab 03/25/20 0558 03/28/20 0410 03/31/20 0510  WBC 8.6 9.3 8.3  NEUTROABS 4.6 5.4 4.7  HGB 13.1 14.2 14.3  HCT 41.1 42.4 42.3  MCV 92.2 89.8 89.6  PLT  429* 417* 390   Basic Metabolic Panel: Recent Labs  Lab 03/25/20 0558 03/28/20 0410 03/31/20 0510  NA 144 142 140  K 3.6 3.8 3.8  CL 105 107 105  CO2 27 26 23   GLUCOSE 119* 110* 110*  BUN 15 13 8   CREATININE 0.70 0.73  0.62  CALCIUM 8.7* 8.9 8.7*   GFR: Estimated Creatinine Clearance: 142.1 mL/min (by C-G formula based on SCr of 0.62 mg/dL). Liver Function Tests: Recent Labs  Lab 03/25/20 0558 03/28/20 0410 03/31/20 0510  AST 29 48* 30  ALT 55* 91* 61*  ALKPHOS 145* 155* 152*  BILITOT 0.5 0.7 0.6  PROT 6.1* 6.4* 6.0*  ALBUMIN 2.9* 3.0* 3.1*   No results for input(s): LIPASE, AMYLASE in the last 168 hours. No results for input(s): AMMONIA in the last 168 hours. Coagulation Profile: No results for input(s): INR, PROTIME in the last 168 hours. Cardiac Enzymes: No results for input(s): CKTOTAL, CKMB, CKMBINDEX, TROPONINI in the last 168 hours. BNP (last 3 results) No results for input(s): PROBNP in the last 8760 hours. HbA1C: No results for input(s): HGBA1C in the last 72 hours. CBG: Recent Labs  Lab 03/30/20 1533 03/30/20 1937 03/30/20 2315 03/31/20 0312 03/31/20 0358  GLUCAP 120* 78 120* 67* 113*   Lipid Profile: No results for input(s): CHOL, HDL, LDLCALC, TRIG, CHOLHDL, LDLDIRECT in the last 72 hours. Thyroid Function Tests: No results for input(s): TSH, T4TOTAL, FREET4, T3FREE, THYROIDAB in the last 72 hours. Anemia Panel: No results for input(s): VITAMINB12, FOLATE, FERRITIN, TIBC, IRON, RETICCTPCT in the last 72 hours. Sepsis Labs: No results for input(s): PROCALCITON, LATICACIDVEN in the last 168 hours.  No results found for this or any previous visit (from the past 240 hour(s)).       Radiology Studies: MR BRAIN W WO CONTRAST  Result Date: 03/30/2020 CLINICAL DATA:  Glioblastoma.  Assess response to treatment. EXAM: MRI HEAD WITHOUT AND WITH CONTRAST TECHNIQUE: Multiplanar, multiecho pulse sequences of the brain and surrounding structures were obtained without and with intravenous contrast. CONTRAST:  109m GADAVIST GADOBUTROL 1 MMOL/ML IV SOLN COMPARISON:  MRI head 02/18/2020 FINDINGS: Brain: Left parietal lobe resection. Progressive T2 hyperintensity and enhancement  surrounding the surgical cavity compatible with tumor progression. These areas also show progressive diffusion hyperintensity. Much of the progression is superior to the surgical cavity and deep to the surgical cavity adjacent to the atrium of the left lateral ventricle which abuts enhancing tumor. No involvement of the corpus callosum and no contralateral tumor. Chronic blood products in the surgical cavity. Previously noted diffusion hyperintensity left body and tail hippocampus has resolved. There remains mild T2 and FLAIR hyperintensity in the left amygdala without enhancement. Ventricle size normal.  No midline shift.  No acute infarct. Vascular: Normal arterial flow voids Skull and upper cervical spine: Left parietal craniotomy. Sinuses/Orbits: Moderate mucosal edema left maxillary sinus with progression. Bilateral mastoid effusions have developed in the interval. Normal orbit. Other: None IMPRESSION: Progression of tumor surrounding the left parietal resection site. Progressive T2 hyperintensity and irregular enhancement primarily anteriorly and deep to the resection cavity compatible with tumor progression. Previously noted diffusion hyperintensity in the body and tail of the hippocampus has improved. There remains hyperintensity in the left medial temporal lobe best seen on FLAIR. No enhancement. This may be related to seizure or paraneoplastic syndrome, or possibly nonenhancing tumor. Electronically Signed   By: CFranchot GalloM.D.   On: 03/30/2020 11:33        Scheduled Meds: . amLODipine  10 mg Oral Daily  . bethanechol  10 mg Oral TID  . Chlorhexidine Gluconate Cloth  6 each Topical Daily  . cloNIDine  0.1 mg Oral Daily  . docusate  100 mg Oral BID  . enoxaparin (LOVENOX) injection  40 mg Subcutaneous Q24H  . feeding supplement  237 mL Oral BID BM  . insulin aspart  0-15 Units Subcutaneous Q4H  . lacosamide  100 mg Oral BID  . pantoprazole sodium  40 mg Oral Daily  . PHENObarbital  90  mg Oral BID  . QUEtiapine  200 mg Oral BID   Continuous Infusions: . sodium chloride Stopped (02/26/20 2221)  . sodium chloride Stopped (03/02/20 1734)          Aline August, MD [facility] 03/31/2020, 7:24 AM

## 2020-03-31 NOTE — Progress Notes (Signed)
Neuro-Oncology Progress Note  Had extensive virtual goals of care discussion with patient's daughter, based on recent MRI brain results.  MRI demonstrates significant progression of disease, tumor growth especially inferior to resection cavity, in just 1 month.  We communicated that this information limits his prognosis further and confers more aggressive cancer phenotype.  Because of cognitive impairment, quality of life concerns and poor prognosis, she has elected to transition him to palliative/comfort measures.  Per the daughter, he apparently has discussed understanding "I am dying" and "wanting to go home".    Discussed case with inpatient palliative care team at 12:30pm today.  They will be by to re-assess patient and reach out to the daughter within the next 24 hours.    Ventura Sellers, MD

## 2020-03-31 NOTE — Progress Notes (Signed)
Neurosurgery  Patient seen and examined. Awake, alert and oriented to person. Speech with significant semantic paraphasias. Follows commands times four with no drift.  Status post craniotomy for left parietal lobe GBM - Patient will likely transition to hospice given evidence of progression already and quality of health concerns

## 2020-03-31 NOTE — Progress Notes (Signed)
Physical Therapy Treatment Patient Details Name: Antonio Glenn MRN: [id number] DOB: [birth year] Today's Date: 03/31/2020    History of Present Illness Antonio Glenn is a 60 y.o. male with hx of recently diagnosed brain tumor who was transferred from [facility] ED to [facility] 11/25 with AMS and seizures.CRANIOTOMY OF  LEFT PARIETAL TUMOR 12/1 and on vent, trach off vent 12/15    PT Comments    Pt initially eager to get up, but prep incl peri care and securing the undergarment got him restless and mildly agitated.  Emphasis on gait stability/stamina, but pt got frustrated with problems with undergarment being too loose and he shut down and asked to go back to bed.  Pt did spend most of the treatment up on his feet.    Follow Up Recommendations  SNF     Equipment Recommendations  Other (comment) (TBD)    Recommendations for Other Services       Precautions / Restrictions Precautions Precautions: Fall    Mobility  Bed Mobility Overal bed mobility: Needs Assistance Bed Mobility: Supine to Sit;Sit to Supine     Supine to sit: Min guard;Min assist (depending on how much momentum he gains) Sit to supine: Min guard;Min assist   General bed mobility comments: variable based on cuing and alertness and whether he gets enough momentum  Transfers Overall transfer level: Needs assistance   Transfers: Sit to/from Stand Sit to Stand: Min assist         General transfer comment: cues for safety, but pt doesn't usually follow the instruction verbally without further cuing.  Ambulation/Gait Ambulation/Gait assistance: Min assist Gait Distance (Feet): 200 Feet Assistive device: Rolling walker (2 wheeled) Gait Pattern/deviations: Step-through pattern;Decreased step length - right;Decreased step length - left;Decreased stride length;Staggering right;Drifts right/left Gait velocity: decr Gait velocity interpretation: <1.31 ft/sec, indicative of household ambulator General Gait Details: pt  overall unsteady, drifting/fading R, stepping into the rear of the RW.  Assist for stability, assist with R hand on the RW, Cues for direction.   Stairs             Wheelchair Mobility    Modified Rankin (Stroke Patients Only) Modified Rankin (Stroke Patients Only) Modified Rankin: Moderately severe disability     Balance Overall balance assessment: Needs assistance Sitting-balance support: No upper extremity supported Sitting balance-Leahy Scale: Fair Sitting balance - Comments: pt able to sustain without UE support   Standing balance support: Bilateral upper extremity supported;During functional activity Standing balance-Leahy Scale: Poor Standing balance comment: reliant on BUE support and external support                            Cognition Arousal/Alertness: Awake/alert Behavior During Therapy: Restless Overall Cognitive Status: Impaired/Different from baseline                   Orientation Level: Place;Time;Situation Current Attention Level: Sustained Memory: Decreased short-term memory Following Commands: Follows one step commands with increased time Safety/Judgement: Decreased awareness of safety;Decreased awareness of deficits Awareness: Intellectual Problem Solving: Slow processing;Decreased initiation;Difficulty sequencing;Requires verbal cues;Requires tactile cues        Exercises      General Comments        Pertinent Vitals/Pain Pain Assessment: Faces Faces Pain Scale: Hurts a little bit Pain Location: general Pain Descriptors / Indicators: Discomfort Pain Intervention(s): Monitored during session    Home Living  Prior Function            PT Goals (current goals can now be found in the care plan section) Acute Rehab PT Goals Patient Stated Goal: to move more PT Goal Formulation: Patient unable to participate in goal setting Time For Goal Achievement: 04/04/20 Potential to Achieve  Goals: Fair Progress towards PT goals: Progressing toward goals (limited cognitive progress, mobility more improved.)    Frequency    Min 3X/week      PT Plan Current plan remains appropriate    Co-evaluation              AM-PAC PT "6 Clicks" Mobility   Outcome Measure  Help needed turning from your back to your side while in a flat bed without using bedrails?: A Little Help needed moving from lying on your back to sitting on the side of a flat bed without using bedrails?: A Little Help needed moving to and from a bed to a chair (including a wheelchair)?: A Little Help needed standing up from a chair using your arms (e.g., wheelchair or bedside chair)?: A Lot Help needed to walk in hospital room?: A Lot Help needed climbing 3-5 steps with a railing? : A Lot 6 Click Score: 15    End of Session   Activity Tolerance: Patient tolerated treatment well Patient left: in bed;with call bell/phone within reach;with bed alarm set Nurse Communication: Mobility status PT Visit Diagnosis: Other abnormalities of gait and mobility (R26.89);Pain Hemiplegia - Right/Left: Right Hemiplegia - dominant/non-dominant: Dominant Hemiplegia - caused by: Unspecified Pain - part of body:  (general)     Time: 0865-7846 PT Time Calculation (min) (ACUTE ONLY): 18 min  Charges:  $Gait Training: 8-22 mins                     03/31/2020  Ginger Carne., PT Acute Rehabilitation Services [phone]  (pager) [phone]  (office)   Tessie Fass Markelle Najarian 03/31/2020, 1:27 PM

## 2020-03-31 NOTE — Progress Notes (Signed)
CBG 67. 8 oz of orange juice given. CBG 113.

## 2020-03-31 NOTE — Progress Notes (Signed)
  Speech Language Pathology Treatment: Cognitive-Linquistic  Patient Details Name: JEFTE CARITHERS MRN: [id number] DOB: 15-Apr-1960 Today's Date: 03/31/2020 Time: 2637-8588 SLP Time Calculation (min) (ACUTE ONLY): 16 min  Assessment / Plan / Recommendation Clinical Impression  Language and cognitive therapy this afternoon with diagnostic treatment focusing on current comprehension and expressive level. Basic yes/no pt was 40% accurate. Followed commands inconsistently and accuracy increases with visual cues/gesture. Expression in series such days of week, months he perseverated on counting and could not complete series with phrase completion cues or in unison with therapist. Social greetings/language appropriate most of the time and can relay thoughts in accurate phrase or sentence occasionally. Therapist walked into room and he had urinated on the floor and said "I dropped some pee; sorry I didn't have anything else."  With cueing he has not been able to learn new information or carry over. He is not aware of his errors in verbalizations- although today he did stop and demonstrated frustration x 1.   HPI HPI: JAHZIR STROHMEIER is a 60 y.o. male with hx of recently diagnosed brain tumor (left parietal lobe) who was transferred from [place] ED to [facility] 11/25 with AMS and seizures. Underwent resection 12/1. Intubated 12/1-12/15. Had FEES 12/21 with recomendation for NPO with occasional tsp honey thick water. MBS 12/28 revealed improvements with recs for dysphagia 1, nectar thick liquids.      SLP Plan  Continue with current plan of care       Recommendations                   Oral Care Recommendations: Oral care BID Follow up Recommendations: Skilled Nursing facility SLP Visit Diagnosis: Aphasia (R47.01);Cognitive communication deficit (F02.774) Plan: Continue with current plan of care                       Houston Siren 03/31/2020, 2:56 PM Orbie Pyo Colvin Caroli.Ed  Risk analyst [phone] Office [phone]

## 2020-04-01 DIAGNOSIS — G9340 Encephalopathy, unspecified: Secondary | ICD-10-CM | POA: Diagnosis not present

## 2020-04-01 DIAGNOSIS — Z515 Encounter for palliative care: Secondary | ICD-10-CM | POA: Diagnosis not present

## 2020-04-01 DIAGNOSIS — G9389 Other specified disorders of brain: Secondary | ICD-10-CM | POA: Diagnosis not present

## 2020-04-01 DIAGNOSIS — R451 Restlessness and agitation: Secondary | ICD-10-CM | POA: Diagnosis not present

## 2020-04-01 LAB — GLUCOSE, CAPILLARY
Glucose-Capillary: 102 mg/dL — ABNORMAL HIGH (ref 70–99)
Glucose-Capillary: 110 mg/dL — ABNORMAL HIGH (ref 70–99)
Glucose-Capillary: 120 mg/dL — ABNORMAL HIGH (ref 70–99)
Glucose-Capillary: 180 mg/dL — ABNORMAL HIGH (ref 70–99)

## 2020-04-01 MED ORDER — DIAZEPAM 5 MG PO TABS
5.0000 mg | ORAL_TABLET | Freq: Four times a day (QID) | ORAL | Status: DC | PRN
  Administered 2020-04-01 – 2020-04-04 (×2): 5 mg via ORAL
  Filled 2020-04-01 (×3): qty 1

## 2020-04-01 MED ORDER — LORAZEPAM 2 MG/ML IJ SOLN: 0.5000 mg | INTRAMUSCULAR | Status: DC | PRN

## 2020-04-01 MED ORDER — OLANZAPINE 5 MG PO TBDP
10.0000 mg | ORAL_TABLET | Freq: Every day | ORAL | Status: DC | PRN
  Administered 2020-04-01 – 2020-04-10 (×2): 10 mg via ORAL
  Filled 2020-04-01 (×3): qty 2

## 2020-04-01 MED ORDER — BIOTENE DRY MOUTH MT LIQD: 15.0000 mL | OROMUCOSAL | Status: DC | PRN

## 2020-04-01 MED ORDER — POLYVINYL ALCOHOL 1.4 % OP SOLN
1.0000 [drp] | Freq: Four times a day (QID) | OPHTHALMIC | Status: DC | PRN
  Filled 2020-04-01: qty 15

## 2020-04-01 MED ORDER — GLYCOPYRROLATE 0.2 MG/ML IJ SOLN: 0.2000 mg | INTRAMUSCULAR | Status: DC | PRN

## 2020-04-01 MED ORDER — GLYCOPYRROLATE 1 MG PO TABS: 1.0000 mg | ORAL_TABLET | ORAL | Status: DC | PRN

## 2020-04-01 MED ORDER — OLANZAPINE 5 MG PO TBDP
10.0000 mg | ORAL_TABLET | Freq: Every day | ORAL | Status: DC
  Administered 2020-04-02 – 2020-04-10 (×9): 10 mg via ORAL
  Filled 2020-04-01 (×11): qty 2

## 2020-04-01 MED ORDER — HALOPERIDOL LACTATE 2 MG/ML PO CONC
2.0000 mg | Freq: Two times a day (BID) | ORAL | Status: DC
  Administered 2020-04-01 – 2020-04-11 (×18): 2 mg via ORAL
  Filled 2020-04-01 (×21): qty 1

## 2020-04-01 NOTE — Progress Notes (Signed)
Patient ID: Antonio Glenn, male   DOB: May 09, 1960, 60 y.o.   MRN: [id number]  PROGRESS NOTE    HOVANES HYMAS  YSA:[id number] DOB: [birth year] DOA: 02/11/2020 PCP: Leonard Downing, MD   Brief Narrative:  60 yo male with PMH of obesity; admitted on11/25/2021, with complaint of seizures and confusion, was found to have brain mass s/p craniotomy on 12/1//2021 of which biopsy showed Grade 4 GBM.  Hospitalist consulted for medical management.  Palliative on board, patient currently a DNR due to very poor prognosis.  Awaiting discharge to SNF  Assessment & Plan:    Acute metabolic encephalopathy -likely new baseline given underlying GBM -Continue Seroquel, phenobarbital along with as needed Valium and Haldol -Patient was more agitated overnight requiring Haldol and restraints.  will add Ativan to the regimen for extreme agitation.  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 Neuro oncology following.  Repeat MRI of brain done on 03/31/2020 showed significant progression of disease/tumor.  This was discussed with neuro-oncology with the patient/family: Patient interested in going home with possibly hospice.  Palliative care reevaluation pending.  If condition continues to get worse, recommend total comfort measures. -EEG negative for active seizures -Vimpat and phenobarbital for seizures ppx  Dysphagia -SLP on board, now recommend dysphagia type I, nectar thick liquids -Strict aspiration precautions  Acute respiratory failure with hypoxia, POA secondary to encephalopathy -Currently on room air -Needed intubation initially, extubated on 12/15  MRSA HAP -Resolved -Low-grade fever on 12/17, resolved on its own -Prior pneumonia, treated with IV vancomycin. -Antibiotic course completed. -Patient had a T-max of 102 over the last 24 hours.  If continues to spike  temperatures, might have to rule out source of infection pending further palliative care discussions.  Paroxysmal A. Fib -Currently rate controlled. -Anticoagulation on hold due to CNS mass  Thoracic aortic aneurysm -incidental diagnosis. -Repeat CT scan in 6 months  Hypertension -Hypotensive this morning.  DC clonidine and Norvasc.  Right upper extremity superficial venous thrombosis -No evidence of DVT -No need for anticoagulation, also not a candidate  Obesity -Outpatient follow-up  Goals of care discussion -Patient with poor prognosis, encephalopathic (possibly new baseline) -Plan as above.  Subjective: Patient seen and examined at bedside.  Extremely poor historian.  Nursing staff reports that patient was agitated overnight requiring Haldol and restraints.  Patient had fever last night.  No overnight vomiting, chest pain reported. Objective: Vitals:   03/31/20 1925 04/01/20 0004 04/01/20 0312 04/01/20 0500  BP: 107/78 (!) 148/69 104/77   Pulse: (!) 110  (!) 107   Resp: 20     Temp: (!) 102 F (38.9 C) 98.6 F (37 C) (!) 100.7 F (38.2 C)   TempSrc: Axillary Oral    SpO2: 98% 97% 97%   Weight:    126.4 kg  Height:        Intake/Output Summary (Last 24 hours) at 04/01/2020 0729 Last data filed at 03/31/2020 1900 Gross per 24 hour  Intake 540 ml  Output -  Net 540 ml   Filed Weights   03/29/20 0500 03/30/20 0429 04/01/20 0500  Weight: 129.5 kg 129.1 kg 126.4 kg    Examination:  General exam: Looks chronically ill.  Confused.  No distress. Respiratory system: Bilateral decreased breath sounds at bases with scattered crackles cardiovascular system: S1-S2 heard, tachycardic Gastrointestinal system: Abdomen is nondistended, soft and nontender.  Normal bowel sounds are heard  extremities: No cyanosis.  Trace lower extreme edema present  Data Reviewed: I have personally reviewed following labs and imaging studies  CBC: Recent Labs  Lab 03/28/20 0410  03/31/20 0510  WBC 9.3 8.3  NEUTROABS 5.4 4.7  HGB 14.2 14.3  HCT 42.4 42.3  MCV 89.8 89.6  PLT 417* 220   Basic Metabolic Panel: Recent Labs  Lab 03/28/20 0410 03/31/20 0510  NA 142 140  K 3.8 3.8  CL 107 105  CO2 26 23  GLUCOSE 110* 110*  BUN 13 8  CREATININE 0.73 0.62  CALCIUM 8.9 8.7*   GFR: Estimated Creatinine Clearance: 140.7 mL/min (by C-G formula based on SCr of 0.62 mg/dL). Liver Function Tests: Recent Labs  Lab 03/28/20 0410 03/31/20 0510  AST 48* 30  ALT 91* 61*  ALKPHOS 155* 152*  BILITOT 0.7 0.6  PROT 6.4* 6.0*  ALBUMIN 3.0* 3.1*   No results for input(s): LIPASE, AMYLASE in the last 168 hours. No results for input(s): AMMONIA in the last 168 hours. Coagulation Profile: No results for input(s): INR, PROTIME in the last 168 hours. Cardiac Enzymes: No results for input(s): CKTOTAL, CKMB, CKMBINDEX, TROPONINI in the last 168 hours. BNP (last 3 results) No results for input(s): PROBNP in the last 8760 hours. HbA1C: No results for input(s): HGBA1C in the last 72 hours. CBG: Recent Labs  Lab 03/31/20 1212 03/31/20 1514 03/31/20 1948 04/01/20 0004 04/01/20 0314  GLUCAP 112* 102* 97 110* 120*   Lipid Profile: No results for input(s): CHOL, HDL, LDLCALC, TRIG, CHOLHDL, LDLDIRECT in the last 72 hours. Thyroid Function Tests: No results for input(s): TSH, T4TOTAL, FREET4, T3FREE, THYROIDAB in the last 72 hours. Anemia Panel: No results for input(s): VITAMINB12, FOLATE, FERRITIN, TIBC, IRON, RETICCTPCT in the last 72 hours. Sepsis Labs: No results for input(s): PROCALCITON, LATICACIDVEN in the last 168 hours.  No results found for this or any previous visit (from the past 240 hour(s)).       Radiology Studies: MR BRAIN W WO CONTRAST  Result Date: 03/30/2020 CLINICAL DATA:  Glioblastoma.  Assess response to treatment. EXAM: MRI HEAD WITHOUT AND WITH CONTRAST TECHNIQUE: Multiplanar, multiecho pulse sequences of the brain and surrounding  structures were obtained without and with intravenous contrast. CONTRAST:  37m GADAVIST GADOBUTROL 1 MMOL/ML IV SOLN COMPARISON:  MRI head 02/18/2020 FINDINGS: Brain: Left parietal lobe resection. Progressive T2 hyperintensity and enhancement surrounding the surgical cavity compatible with tumor progression. These areas also show progressive diffusion hyperintensity. Much of the progression is superior to the surgical cavity and deep to the surgical cavity adjacent to the atrium of the left lateral ventricle which abuts enhancing tumor. No involvement of the corpus callosum and no contralateral tumor. Chronic blood products in the surgical cavity. Previously noted diffusion hyperintensity left body and tail hippocampus has resolved. There remains mild T2 and FLAIR hyperintensity in the left amygdala without enhancement. Ventricle size normal.  No midline shift.  No acute infarct. Vascular: Normal arterial flow voids Skull and upper cervical spine: Left parietal craniotomy. Sinuses/Orbits: Moderate mucosal edema left maxillary sinus with progression. Bilateral mastoid effusions have developed in the interval. Normal orbit. Other: None IMPRESSION: Progression of tumor surrounding the left parietal resection site. Progressive T2 hyperintensity and irregular enhancement primarily anteriorly and deep to the resection cavity compatible with tumor progression. Previously noted diffusion hyperintensity in the body and tail of the hippocampus has improved. There remains hyperintensity in the left medial temporal lobe best seen on FLAIR. No enhancement. This may be related  to seizure or paraneoplastic syndrome, or possibly nonenhancing tumor. Electronically Signed   By: Franchot Gallo M.D.   On: 03/30/2020 11:33        Scheduled Meds: . amLODipine  10 mg Oral Daily  . bethanechol  10 mg Oral TID  . Chlorhexidine Gluconate Cloth  6 each Topical Daily  . cloNIDine  0.1 mg Oral Daily  . docusate  100 mg Oral BID  .  enoxaparin (LOVENOX) injection  40 mg Subcutaneous Q24H  . feeding supplement  237 mL Oral BID BM  . insulin aspart  0-15 Units Subcutaneous Q4H  . lacosamide  100 mg Oral BID  . pantoprazole sodium  40 mg Oral Daily  . PHENObarbital  90 mg Oral BID  . QUEtiapine  200 mg Oral BID   Continuous Infusions: . sodium chloride Stopped (02/26/20 2221)  . sodium chloride Stopped (03/02/20 1734)          Aline August, MD [facility] 04/01/2020, 7:29 AM

## 2020-04-01 NOTE — Progress Notes (Signed)
Daily Progress Note   Patient Name: Antonio Glenn       Date: 04/01/2020 DOB: 1960-05-19  Age: 60 y.o. MRN#: [id number] Attending Physician: [name], MD Primary Care Physician: Leonard Downing, MD Admit Date: 02/11/2020  Reason for Consultation/Follow-up: To discuss complex medical decision making related to patient's goals of care Discussed with RN at bedside.  MRI 03/30/20 shows tumor regrowth and progression after resection a few short weeks ago. Again required restraints overnight due to aggravated agitation.  Subjective: Met with patient at bedside.  His emotions swing widely and quickly from happiness to concern to sadness.  He denies pain or discomfort.  He wants to see his family and go home.  I talked with his daughter on the phone.  Patient is not married, but his ex-wife and daughter are both HCPOA.  Daughter is very concerned about making appropriate decisions for him.   She is considering DC to home, DC to SNF and DC to Hospice facility.  We talked about care-giver burnout with a sole caregiver at home for him.  We also discussed the care he would receive at SNF.  So far he is not receiving any SNF offers due to his severe agitation.   Daughter understands he is nearing end of life and wants the best possible care for him.  Assessment: Patient with a very aggressive phenotype of GBM.  Tumor regrowth post resection.  No options for oncologic therapy.  Family agreeable to comfort measures and hospice care.   Patient Profile/HPI:  60 y.o. male  with past medical history of obesity admitted on 02/11/2020 with seizures and confusion. Found to have brain mass s/p craniotomy 12/1. Pathology consistent with GBM, grade 4. Patient has required core track - now tolerating  some PO intake. Also with agitation and requiring restraints. PMT consulted to discuss [place].   Length of Stay: 50   Vital Signs: BP (!) 138/93 (BP Location: Right Arm)   Pulse (!) 107   Temp 97.6 F (36.4 C) (Oral)   Resp 19   Ht _0  (1.905 m)   Wt 126.4 kg   SpO2 93%   BMI 34.83 kg/m  SpO2: SpO2: 93 % O2 Device: O2 Device: Room Air O2 Flow Rate: O2 Flow Rate (L/min): 4 L/min  Palliative Assessment/Data: 30%     Palliative Care Plan    Recommendations/Plan:  Symptom management -  will DC seroquel and trial Olanzapine to determine if he responds better.  Schedule haldol bid.  PRN valium q 6 hours.  PMT meeting tomorrow at 12:00 pm with daughter and ex- wife.  Code Status:  DNR  Prognosis:  Weeks    Discharge Planning:  To Be Determined  Care plan was discussed with Dr. Mickeal Skinner, Dr. Domingo Cocking, Dtr Antonio Glenn, [facility]  Thank you for allowing the Palliative Medicine Team to assist in the care of this patient.  Total time spent:   65 min.     Greater than 50%  of this time was spent counseling and coordinating care related to the above assessment and plan.  Florentina Jenny, PA-C Palliative Medicine  Please contact Palliative MedicineTeam phone at [phone] for questions and concerns between 7 am - 7 pm.   Please see AMION for individual provider pager numbers.

## 2020-04-01 NOTE — Progress Notes (Signed)
Subjective: NAEs o/n  Objective: Vital signs in last 24 hours: Temp:  [97.6 F (36.4 C)-102 F (38.9 C)] 97.6 F (36.4 C) (01/14 1105) Pulse Rate:  [88-110] 107 (01/14 0312) Resp:  [18-20] 19 (01/14 1105) BP: (104-148)/(66-93) 138/93 (01/14 1105) SpO2:  [93 %-99 %] 93 % (01/14 1105) Weight:  [126.4 kg] 126.4 kg (01/14 0500)  Intake/Output from previous day: 01/13 0701 - 01/14 0700 In: 540 [P.O.:540] Out: -  Intake/Output this shift: No intake/output data recorded.  Awake, alert + receptive dysphasia Incision well-healed  Lab Results: Recent Labs    03/31/20 0510  WBC 8.3  HGB 14.3  HCT 42.3  PLT 364   BMET Recent Labs    03/31/20 0510  NA 140  K 3.8  CL 105  CO2 23  GLUCOSE 110*  BUN 8  CREATININE 0.62  CALCIUM 8.7*    Studies/Results: No results found.  Assessment/Plan: L parietal GBM - will likely transition to palliative care  Antonio Glenn 04/01/2020, 11:18 AM

## 2020-04-01 NOTE — Progress Notes (Signed)
[facility] Community Based Palliative Care       This patient has been referred to our palliative care services in the community.  [facility] will continue to follow for any discharge planning needs and to coordinate admission onto palliative care.    If you have questions or need assistance, please call [phone] or contact the hospital Liaison listed on AMION.     Thank you for the opportunity to participate in this patient's care.     Chrislyn King, BSN, RN [facility] Hospital Liaison [phone] (24h on call)   

## 2020-04-01 NOTE — Progress Notes (Signed)
Spoke with pt daughter who is concerned about increased agitation over night. She request pt be sedated to provide some peace. This nurse told the daughter that pt currently had a visitor and was doing better.  Daughter also requested to be contacted and updated by oncology and  CM/SW.  Katherina Right RN

## 2020-04-02 DIAGNOSIS — R451 Restlessness and agitation: Secondary | ICD-10-CM | POA: Diagnosis not present

## 2020-04-02 DIAGNOSIS — R569 Unspecified convulsions: Secondary | ICD-10-CM | POA: Diagnosis not present

## 2020-04-02 DIAGNOSIS — Z515 Encounter for palliative care: Secondary | ICD-10-CM | POA: Diagnosis not present

## 2020-04-02 DIAGNOSIS — G9389 Other specified disorders of brain: Secondary | ICD-10-CM | POA: Diagnosis not present

## 2020-04-02 NOTE — Progress Notes (Addendum)
Daily Progress Note   Patient Name: Antonio Glenn       Date: 04/02/2020 DOB: [birth year]  Age: 60 y.o. MRN#: [id number] Attending Physician: [name], MD Primary Care Physician: Leonard Downing, MD Admit Date: 02/11/2020  Reason for Consultation/Follow-up: To discuss complex medical decision making related to patient's goals of care  Patient's HCPOA is his Ex Wife Antonio Glenn.  Her number is on face sheet.  Subjective: Met with patient, ex-wife and daughter at bedside and then met ex-wife and daughter in a private conference room.  Family was quite surprised that he seemed cognitively much worse than he was a few weeks ago.  We discussed patient's symptoms and management.  He is generally a very sweet man but has had problems with confusion leading to agitation and even striking RNs.  He is able to be re-directed but does have a temper and needs continuous attention. He had not slept the previous 3 nights.  He has been in restraints on and off since his hospitalization.  He has significant difficulty with expressive aphasia as well as receptive aphasia.  He remains on vimpat and phenobarbital for seizure prevention. He is requiring valium PRN and scheduled haldol for agitation.  With the initiation of Olanzapine last night the patient slept well.  Today so far he is alert and happy.    Antonio Glenn asked about what symptoms to expect over the next weeks until EOL.  How to prepare for it.  We discussed some symptoms and I was able to give her an article that addressed the subject as well.    We talked about whether he should go home with Hospice services or perhaps he should go to the [facility] first to have his symptoms controlled and then potentially go home with Hospice  services.  His ex-wife (medical POA) explains that the patient lives 40 minutes from both herself and Antonio Glenn.  He has a male friend who has been staying at his house and is willing to stay there and care for him.  We discussed my concerns about caregiver burn out, but perhaps if the friend could keep him for several weeks he may go to Hospice facility after that.  Assessment:  60 yo male with grade 4 GBM s/p resection now with evidence of aggressive tumor regrowth.  No opportunity for Oncologic treatment.  Patient Profile/HPI:  60 y.o. male  with past medical history of obesity admitted on 02/11/2020 with seizures and confusion. Found to have brain mass s/p craniotomy 12/1. Pathology consistent with GBM, grade 4. Patient has required core track - now tolerating some PO intake. Also with agitation and requiring restraints. PMT consulted to discuss [place].   Length of Stay: 51   Vital Signs: BP (!) 138/93 (BP Location: Right Arm)   Pulse (!) 107   Temp 98.2 F (36.8 C) (Oral)   Resp 18   Ht 6' 3"  (1.905 m)   Wt 126.4 kg   SpO2 98%   BMI 34.83 kg/m  SpO2: SpO2: 98 % O2 Device: O2 Device: Room Air O2 Flow Rate: O2 Flow Rate (L/min): 4 L/min       Palliative Assessment/Data: 50%     Palliative Care Plan    Recommendations/Plan:  Patient's HCPOA is his Ex Wife Antonio Glenn.  Her number is on face sheet.  Antonio Glenn and Antonio Glenn are his primary [employer].  Will continue with current medication regimen and reassess tomorrow  [facility] referral for [facility] location) to Best boy and determine if it is best that he come into [place] [facility] for symptom management initially or is it more appropriate that he goes home initially.  Code Status:  DNR  Prognosis:  Weeks  Discharge Planning:  Either [facility] in [place] or Hospice services in patient's home.  Care plan was discussed with RN, family, and I called [facility] answering  service and gave them the daughter's information.  Thank you for allowing the Palliative Medicine Team to assist in the care of this patient.  Total time spent:  45 min.     Greater than 50%  of this time was spent counseling and coordinating care related to the above assessment and plan.  Florentina Jenny, PA-C Palliative Medicine  Please contact Palliative MedicineTeam phone at [phone] for questions and concerns between 7 am - 7 pm.   Please see AMION for individual provider pager numbers.

## 2020-04-02 NOTE — Progress Notes (Signed)
Patient ID: Antonio Glenn, male   DOB: 1960/05/12, 60 y.o.   MRN: [id number] BP (!) 138/93 (BP Location: Right Arm)   Pulse (!) 107   Temp 98.2 F (36.8 C) (Oral)   Resp 18   Ht 6\' 3"  (1.905 m)   Wt 126.4 kg   SpO2 98%   BMI 34.83 kg/m  Alert, will follow commands Awaiting hospice Wound is clean, dry, no signs of infection

## 2020-04-02 NOTE — Progress Notes (Signed)
Patient ID: Antonio Glenn, male   DOB: [birth year], 60 y.o.   MRN: [id number]  PROGRESS NOTE    Antonio Glenn  RAX:[id number] DOB: Feb 05, 1961 DOA: 02/11/2020 PCP: Leonard Downing, MD   Brief Narrative:  60 yo male with PMH of obesity; admitted on11/25/2021, with complaint of seizures and confusion, was found to have brain mass s/p craniotomy on 12/1//2021 of which biopsy showed Grade 4 GBM.  Hospitalist consulted for medical management.  Palliative on board, patient currently a DNR due to very poor prognosis.  Awaiting discharge to SNF  Assessment & Plan:    Acute metabolic encephalopathy -likely new baseline given underlying GBM -Continue phenobarbital along with as needed Valium and Haldol.  Seroquel switched to Zyprexa by palliative care on 04/01/2020. 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 Neuro oncology following.  Repeat MRI of brain done on 03/31/2020 showed significant progression of disease/tumor.  This was discussed with neuro-oncology with the patient/family: Patient interested in going home with possibly hospice.   -EEG negative for active seizures -Vimpat and phenobarbital for seizures ppx -Palliative care having family discussion today  Dysphagia -SLP on board, now recommend dysphagia type I, nectar thick liquids -Strict aspiration precautions  Acute respiratory failure with hypoxia, POA secondary to encephalopathy -Currently on room air -Needed intubation initially, extubated on 12/15  MRSA HAP -Resolved -Low-grade fever on 12/17, resolved on its own -Prior pneumonia, treated with IV vancomycin. -Antibiotic course completed. -No temperature spikes over the last 24 hours  Paroxysmal A. Fib -Currently intermittently tachycardic -Anticoagulation on hold due to CNS mass  Thoracic aortic  aneurysm -incidental diagnosis.  Hypertension -DC'd clonidine and Norvasc on 04/01/2020 because of hypotension.  Right upper extremity superficial venous thrombosis -No evidence of DVT -No need for anticoagulation, also not a candidate  Obesity -Outpatient follow-up  Goals of care discussion -Patient with poor prognosis, encephalopathic (possibly new baseline) -Possible discharge home with hospice after palliative care discussion today  Subjective: Patient seen and examined at bedside.  Extremely poor historian.  Required Haldol overnight as per nursing staff.  No overnight fever or vomiting reported  objective: Vitals:   04/01/20 0312 04/01/20 0500 04/01/20 1105 04/01/20 2030  BP: 104/77  (!) 138/93   Pulse: (!) 107     Resp:   19 18  Temp: (!) 100.7 F (38.2 C)  97.6 F (36.4 C) 98.2 F (36.8 C)  TempSrc:   Oral Oral  SpO2: 97%  93% 98%  Weight:  126.4 kg    Height:        Intake/Output Summary (Last 24 hours) at 04/02/2020 0744 Last data filed at 04/02/2020 0710 Gross per 24 hour  Intake 480 ml  Output 100 ml  Net 380 ml   Filed Weights   03/29/20 0500 03/30/20 0429 04/01/20 0500  Weight: 129.5 kg 129.1 kg 126.4 kg    Examination:  General exam: Chronically ill looking.  Sleepy, wakes up slightly, confused, hardly answer some questions.  No acute distress.   Respiratory system: Decreased breath sounds at bases bilaterally with some scattered crackles  cardiovascular system: Rate controlled, S1-S2 heard Gastrointestinal system: Abdomen is nondistended, soft and nontender.  Bowel sounds are heard  extremities: Mild lower extremity edema present.  No clubbing  Data Reviewed: I have personally reviewed following labs and imaging studies  CBC: Recent Labs  Lab 03/28/20 0410 03/31/20 0510  WBC 9.3 8.3  NEUTROABS 5.4 4.7  HGB 14.2 14.3  HCT 42.4 42.3  MCV 89.8 89.6  PLT 417* 301   Basic Metabolic Panel: Recent Labs  Lab 03/28/20 0410 03/31/20 0510   NA 142 140  K 3.8 3.8  CL 107 105  CO2 26 23  GLUCOSE 110* 110*  BUN 13 8  CREATININE 0.73 0.62  CALCIUM 8.9 8.7*   GFR: Estimated Creatinine Clearance: 140.7 mL/min (by C-G formula based on SCr of 0.62 mg/dL). Liver Function Tests: Recent Labs  Lab 03/28/20 0410 03/31/20 0510  AST 48* 30  ALT 91* 61*  ALKPHOS 155* 152*  BILITOT 0.7 0.6  PROT 6.4* 6.0*  ALBUMIN 3.0* 3.1*   No results for input(s): LIPASE, AMYLASE in the last 168 hours. No results for input(s): AMMONIA in the last 168 hours. Coagulation Profile: No results for input(s): INR, PROTIME in the last 168 hours. Cardiac Enzymes: No results for input(s): CKTOTAL, CKMB, CKMBINDEX, TROPONINI in the last 168 hours. BNP (last 3 results) No results for input(s): PROBNP in the last 8760 hours. HbA1C: No results for input(s): HGBA1C in the last 72 hours. CBG: Recent Labs  Lab 03/31/20 1948 04/01/20 0004 04/01/20 0314 04/01/20 0815 04/01/20 1127  GLUCAP 97 110* 120* 180* 102*   Lipid Profile: No results for input(s): CHOL, HDL, LDLCALC, TRIG, CHOLHDL, LDLDIRECT in the last 72 hours. Thyroid Function Tests: No results for input(s): TSH, T4TOTAL, FREET4, T3FREE, THYROIDAB in the last 72 hours. Anemia Panel: No results for input(s): VITAMINB12, FOLATE, FERRITIN, TIBC, IRON, RETICCTPCT in the last 72 hours. Sepsis Labs: No results for input(s): PROCALCITON, LATICACIDVEN in the last 168 hours.  No results found for this or any previous visit (from the past 240 hour(s)).       Radiology Studies: No results found.      Scheduled Meds: . bethanechol  10 mg Oral TID  . Chlorhexidine Gluconate Cloth  6 each Topical Daily  . docusate  100 mg Oral BID  . haloperidol  2 mg Oral BID  . lacosamide  100 mg Oral BID  . OLANZapine zydis  10 mg Oral QHS  . pantoprazole sodium  40 mg Oral Daily  . PHENObarbital  90 mg Oral BID   Continuous Infusions: . sodium chloride Stopped (02/26/20 2221)  . sodium  chloride Stopped (03/02/20 1734)          Aline August, MD [facility] 04/02/2020, 7:44 AM

## 2020-04-03 DIAGNOSIS — G9389 Other specified disorders of brain: Secondary | ICD-10-CM | POA: Diagnosis not present

## 2020-04-03 DIAGNOSIS — R451 Restlessness and agitation: Secondary | ICD-10-CM | POA: Diagnosis not present

## 2020-04-03 DIAGNOSIS — Z515 Encounter for palliative care: Secondary | ICD-10-CM | POA: Diagnosis not present

## 2020-04-03 NOTE — Progress Notes (Addendum)
Daily Progress Note   Patient Name: Antonio Glenn       Date: 04/03/2020 DOB: 1961/02/07  Age: 60 y.o. MRN#: [id number] Attending Physician: [name], MD Primary Care Physician: Leonard Downing, MD Admit Date: 02/11/2020  Reason for Consultation/Follow-up:  To discuss complex medical decision making related to patient's goals of care  IM chatted with bedside RN, reviewed Epic notes.  Subjective: Spoke with Antonio Glenn on the phone.  After visiting her father in the hospital yesterday and speaking in detail with his RN she believes they will be unable to handle Antonio Glenn at home.  She is fearful their caregiver Antonio Glenn is simply not appropriate for the task.  Antonio Glenn herself is getting married next weekend and will be on her honeymoon.  She and the patient's ex-wife live over 40 minutes from the patient.  They received a call from [facility] yesterday but a referral had not yet come thru.  I explained to Antonio Glenn that a referral was given today so I anticipate she will here something from Hospice tomorrow.   Assessment: Patient with grade 4 GBM status post resection.  Imaging shows aggressive regrowth.     Patient Profile/HPI:  60 y.o.malewith past medical history of obesityadmitted on 11/25/2021with seizures and confusion.Found to have brain mass s/p craniotomy 12/1. Pathology consistent with GBM, grade 4. Patient has required core track - now tolerating some PO intake. Also with agitation and requiring restraints. PMT consulted to discuss [place].   Length of Stay: 52   Vital Signs: BP 134/84 (BP Location: Left Arm)   Pulse 83   Temp 99 F (37.2 C) (Oral)   Resp 19   Ht 6\' 3"  (1.905 m)   Wt 126.4 kg   SpO2 99%   BMI 34.83 kg/m  SpO2: SpO2: 99 % O2  Device: O2 Device: Room Air O2 Flow Rate: O2 Flow Rate (L/min): 4 L/min       Palliative Assessment/Data: 40%     Palliative Care Plan    Recommendations/Plan: Admitted with seizures despite being on seizure meds.  Found to have aggressive fast growing GBM   Symptoms:    Agitation -  Scheduled and PRN Olanzapine, Scheduled and PRN Haldol, PRN valium;  Seizures - vimpat and phenobarbital   Patient is comfort measures only.  Plan  for [place].   Code Status:  DNR  Prognosis:  weeks  Discharge Planning:  Hospice facility  Care plan was discussed with Antonio Glenn - daughter.  Thank you for allowing the Palliative Medicine Team to assist in the care of this patient.  Total time spent:  35 min.     Greater than 50%  of this time was spent counseling and coordinating care related to the above assessment and plan.  Florentina Jenny, PA-C Palliative Medicine  Please contact Palliative MedicineTeam phone at [phone] for questions and concerns between 7 am - 7 pm.   Please see AMION for individual provider pager numbers.

## 2020-04-03 NOTE — TOC Progression Note (Signed)
Transition of Care (TOC) - Progression Note  Marvetta Gibbons RN,BSN Transitions of Care Unit 4NP (non trauma) - RN Case Manager See Treatment Team for direct Phone #   Patient Details  Name: Antonio Glenn MRN: [id number] Date of Birth: 1961-02-27  Transition of Care [facility]) CM/SW Contact  [name], [name], RN Phone Number: 04/03/2020, 8:38 AM  Clinical Narrative:    Noted referral for request to contact [place] to contact family for Hospice info- call made to [place] on call person- Charleston Ropes- she will f/u on request and reach out to daughter Alyson Ingles to review Hospice services for home vs hospice facility. Will f/u with this Probation officer after speaking with family.    Expected Discharge Plan: Skilled Nursing Facility Barriers to Discharge: SNF Pending bed offer  Expected Discharge Plan and Services Expected Discharge Plan: [place]   Discharge Planning Services: CM Consult Post Acute Care Choice: [place] Living arrangements for the past 2 months: Single Family Home                                       Social Determinants of Health (SDOH) Interventions    Readmission Risk Interventions No flowsheet data found.

## 2020-04-03 NOTE — Progress Notes (Signed)
Patient ID: Antonio Glenn, male   DOB: 1960-07-13, 60 y.o.   MRN: [id number]  PROGRESS NOTE    PINK MAYE  MGQ:[id number] DOB: June 30, 1960 DOA: 02/11/2020 PCP: Leonard Downing, MD   Brief Narrative:  60 yo male with PMH of obesity; admitted on11/25/2021, with complaint of seizures and confusion, was found to have brain mass s/p craniotomy on 12/1//2021 of which biopsy showed Grade 4 GBM.  Hospitalist consulted for medical management.    Subsequent MRI of the brain showed significant progression of disease/tumor.  After palliative care discussion, patient/family have agreed for home hospice.  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 Fib Thoracic aortic aneurysm Hypertension Hypotension. Right upper extremity superficial venous thrombosis Obesity  Plan -After palliative care discussion, patient/family have agreed for home hospice.  No further input from hospitalist team.  Disposition as per primary neurosurgery service.  Subjective: Patient seen and examined at bedside.  Extremely poor historian.  Sleepy, wakes up only very slightly.   Objective: Vitals:   04/01/20 1105 04/01/20 2030 04/02/20 1945 04/03/20 0716  BP: (!) 138/93  (!) 144/90 134/84  Pulse:   92 83  Resp: 19 18 17 19   Temp: 97.6 F (36.4 C) 98.2 F (36.8 C) 98 F (36.7 C) 99 F (37.2 C)  TempSrc: Oral Oral Axillary Oral  SpO2: 93% 98% 97% 99%  Weight:      Height:        Intake/Output Summary (Last 24 hours) at 04/03/2020 0931 Last data filed at 04/03/2020 0801 Gross per 24 hour  Intake 820 ml  Output 795 ml  Net 25 ml   Filed Weights   03/29/20 0500 03/30/20 0429 04/01/20 0500  Weight: 129.5 kg 129.1 kg 126.4 kg    Examination:  General exam:  Chronically ill looking.  No distress.  Sleepy, wakes up only very slightly, hardly participates in any conversation  respiratory system: Decreased breath sounds at bases bilaterally with some scattered crackles  cardiovascular system: Rate controlled, S1-S2 heard  Data Reviewed: I have personally reviewed following labs and imaging studies  CBC: Recent Labs  Lab 03/28/20 0410 03/31/20 0510  WBC 9.3 8.3  NEUTROABS 5.4 4.7  HGB 14.2 14.3  HCT 42.4 42.3  MCV 89.8 89.6  PLT 417* 267   Basic Metabolic Panel: Recent Labs  Lab 03/28/20 0410 03/31/20 0510  NA 142 140  K 3.8 3.8  CL 107 105  CO2 26 23  GLUCOSE 110* 110*  BUN 13 8  CREATININE 0.73 0.62  CALCIUM 8.9 8.7*   GFR: Estimated Creatinine Clearance: 140.7 mL/min (by C-G formula based on SCr of 0.62 mg/dL). Liver Function Tests: Recent Labs  Lab 03/28/20 0410 03/31/20 0510  AST 48* 30  ALT 91* 61*  ALKPHOS 155* 152*  BILITOT 0.7 0.6  PROT 6.4* 6.0*  ALBUMIN 3.0* 3.1*   No results for input(s): LIPASE, AMYLASE in the last 168 hours. No results for input(s): AMMONIA in the last 168 hours. Coagulation Profile: No results for input(s): INR, PROTIME in the last 168 hours. Cardiac Enzymes: No results for input(s): CKTOTAL, CKMB, CKMBINDEX, TROPONINI in the last 168 hours. BNP (last 3 results) No results for input(s): PROBNP in the last 8760 hours. HbA1C: No results for input(s): HGBA1C in the last 72 hours. CBG: Recent  Labs  Lab 03/31/20 1948 04/01/20 0004 04/01/20 0314 04/01/20 0815 04/01/20 1127  GLUCAP 97 110* 120* 180* 102*   Lipid Profile: No results for input(s): CHOL, HDL, LDLCALC, TRIG, CHOLHDL, LDLDIRECT in the last 72 hours. Thyroid Function Tests: No results for input(s): TSH, T4TOTAL, FREET4, T3FREE, THYROIDAB in the last 72 hours. Anemia Panel: No results for input(s): VITAMINB12, FOLATE, FERRITIN, TIBC, IRON, RETICCTPCT in the last 72 hours. Sepsis Labs: No results for input(s):  PROCALCITON, LATICACIDVEN in the last 168 hours.  No results found for this or any previous visit (from the past 240 hour(s)).       Radiology Studies: No results found.      Scheduled Meds: . bethanechol  10 mg Oral TID  . Chlorhexidine Gluconate Cloth  6 each Topical Daily  . docusate  100 mg Oral BID  . haloperidol  2 mg Oral BID  . lacosamide  100 mg Oral BID  . OLANZapine zydis  10 mg Oral QHS  . pantoprazole sodium  40 mg Oral Daily  . PHENObarbital  90 mg Oral BID   Continuous Infusions: . sodium chloride Stopped (02/26/20 2221)  . sodium chloride Stopped (03/02/20 1734)          Aline August, MD [facility] 04/03/2020, 9:31 AM

## 2020-04-03 NOTE — Progress Notes (Signed)
Subjective: Patient reports no headaches but expresses some frustration with his language  Objective: Vital signs in last 24 hours: Temp:  [98 F (36.7 C)-99 F (37.2 C)] 99 F (37.2 C) (01/16 0716) Pulse Rate:  [83-92] 83 (01/16 0716) Resp:  [17-19] 19 (01/16 0716) BP: (134-144)/(84-90) 134/84 (01/16 0716) SpO2:  [97 %-99 %] 99 % (01/16 0716)  Intake/Output from previous day: 01/15 0701 - 01/16 0700 In: 1150 [P.O.:1150] Out: 895 [Urine:895] Intake/Output this shift: Total I/O In: 480 [P.O.:480] Out: -   Awake, alert, pleasant and conversant. Appears to be in good spirits. Word finding difficulties with semantic paraphasias, but good fluency with simple phrases and shorter sentences Incision c/d  Lab Results: No results for input(s): WBC, HGB, HCT, PLT in the last 72 hours. BMET No results for input(s): NA, K, CL, CO2, GLUCOSE, BUN, CREATININE, CALCIUM in the last 72 hours.  Studies/Results: No results found.  Assessment/Plan: L parietal lobe GBM s/p craniotomy - home hospice vs hospice facility    [name] 04/03/2020, 11:17 AM

## 2020-04-04 NOTE — Progress Notes (Signed)
   Pt re-evaluated again today after being found to have no unmanged symptoms for placement at the [facility] yesterday.  Re-evaluation and discussion with our MD and pt continues to not meet criteria for the [facility] house we are unable to offer a bed die to this.   Webb Silversmith RN

## 2020-04-04 NOTE — Progress Notes (Signed)
Pt pleasant for this most part today. At one point this afternoon, pt's ex-wife called saying that daughter was upset as pt had been on the phone with her for 35 minutes saying that he wanted to leave and she was keeping him here. This RN spoke to pt's daughter and redirected pt. Valium given with no further issues.   Justice Rocher, RN

## 2020-04-04 NOTE — Progress Notes (Signed)
This RN was tied up with 2 emergent situations and was unable to give 1800 Haldol on time. April Redman, RN agreed to give Haldol at beginning of her shift at 1912.   Justice Rocher, RN

## 2020-04-04 NOTE — Progress Notes (Signed)
Neurosurgery   Patient awake, alert, pleasant.  Speech with fluent simple rote phrases but significant semantic paraphasias.  Incision c/d  L parietal GBM - awaiting hospice

## 2020-04-05 MED ORDER — DIAZEPAM 5 MG PO TABS
5.0000 mg | ORAL_TABLET | Freq: Three times a day (TID) | ORAL | Status: DC
  Administered 2020-04-05 – 2020-04-07 (×7): 5 mg via ORAL
  Filled 2020-04-05 (×7): qty 1

## 2020-04-05 MED ORDER — LORAZEPAM 2 MG/ML IJ SOLN: 2.0000 mg | INTRAMUSCULAR | Status: DC | PRN

## 2020-04-05 NOTE — Progress Notes (Addendum)
Palliative Medicine RN Note: Note that pt is not eligible for inpt hospice with [facility] & has been referred to [facility]. Per report from PMT PA Haynes Dage, who is off-service today, pt likely needs SNF w PC, as family cannot care for him at home.  Marjie Skiff Linkon Siverson, RN, BSN, [facility] Palliative Medicine Team 04/05/2020 9:23 AM Office [phone]   ADDENDUM: Rec'd call from pt's daughter Alyson Ingles [phone]). She is concerned because pt seems more confused and agitated w her (he has been calling her). PMT provider will re-evaluate this afternoon.  Marjie Skiff Bodi Palmeri, RN, BSN, [facility] Palliative Medicine Team 04/05/2020 10:44 AM Office [phone]

## 2020-04-05 NOTE — Progress Notes (Signed)
Pt stood and pivoted with 1 assist to the chair. Chair lap belt in place.   Justice Rocher, RN

## 2020-04-05 NOTE — Progress Notes (Signed)
[facility] ([facility]) Community Based Palliative Care       This patient has been referred to our palliative care services in the community.  [facility] will continue to follow for any discharge planning needs and to coordinate admission onto palliative care.    Thank you for the opportunity to participate in this patient's care.     Chrislyn King, BSN, RN [facility] Hospital Liaison   [phone] (24h on call) 

## 2020-04-05 NOTE — Progress Notes (Signed)
Palliative Care Progress Note  60 yo with GBM, s/p resection with aggressive tumor regrowth. Now hospital day 54 with complicated post operative course and difficult to manage symptoms. He has significant language deficits and expressive problems, but is physically very strong and he is completely unaware of his deficits and tells me that he is ready to go run outside (as much as I can understand this)-tells me that he feels great-word substitutions are evident.   His GBM is aggressive and his life expectancy is likely very short-weeks at best- he could decline rapidly and unexpectedly.   I was called to see him today because his daughter and ex-wife report that he is calling them multiple times and getting angry that he is being kept in the hospital. He is agitated when he calls. They are afraid that they cannot control him in his current condition and that his caregiver will not be able to keep him safe. He thinks he can still drive his car and that his tumor is gone.   On my evaluation of him today he is clearly agitated and restless. His speech is pressured and except for a few small phrases that are linked together the rest of his speech is not coherent or understandable - word salad and no logical reasoning. I tried to explain to him his deficits and where my concerns were and he did seem to understand in that moment because he clearly showed sadness. I encouraged him to rest and he said that he would if that would help him. Very difficult to know how much he understands about his condition and prognosis.  I spoke with his daughter at length. She tells me she wants him kept comfortable and free of agitation and distress.  She is getting married on Saturday and will be out of the county for the following week. Her mother -the patient's ex-wife is the medical POA and will be available.  I discussed the trajectory of GBM and the often very difficult to manage behavioral symptoms-I also asked her what  would he have to be like in order to be cared for at home by this caregiver? She says he would need to be calm and able to follow directions -not be a fall risk- possibly sedated but mostly in bed.  I am sure Linna Hoff feels confined and restricted and is also frustrated by his neurological deficits. All that we can do is try to improve his QOL-treat anxiety and agitation even if that means he is more sedated- he may also benefit from ongoing OT and PT just for the mobility and to get out of his room and be monitored for his safety. While his life expectancy is very short, I wonder if he would benefit even short term from [facility] for specialized interventions and recommendations for communicating with him and for controlling his symptoms.  For now I will schedule his Valium TID and added IV ativan for severe agitation or any seizure activity.  Lane Hacker, DO Palliative Medicine  Time: 35 minutes Greater than 50%  of this time was spent counseling and coordinating care related to the above assessment and plan.

## 2020-04-05 NOTE — Progress Notes (Signed)
Neurosurgery  Pt seen and examined.  Remains pleasant, conversant with continued paraphasias and word finding difficulties but remains fluent with simple conversation. He expresses gratitude with the care he has received.  incision well-healed, no drift.  L parietal GBM - awaiting SNF/hospice

## 2020-04-05 NOTE — TOC Progression Note (Signed)
Transition of Care (TOC) - Progression Note  Marvetta Gibbons RN,BSN Transitions of Care Unit 4NP (non trauma) - RN Case Manager See Treatment Team for direct Phone #   Patient Details  Name: Antonio Glenn MRN: [id number] Date of Birth: [birth year]  Transition of Care [facility]) CM/SW Contact  [name], [name], RN Phone Number: 04/05/2020, 12:20 PM  Clinical Narrative:    [name] with [name] at [facility]- per [name] pt still does not meet criteria for [facility] - they will continue to follow at a distance should something change in pt's symptom management. If family decides they want to take pt home with [facility]- they could service for home hospice needs- however noted PC note that daughter at this time does not feel they can manage pt at home and pt will still need SNF placement with [facility].   Expected Discharge Plan: Skilled Nursing Facility Barriers to Discharge: SNF Pending bed offer  Expected Discharge Plan and Services Expected Discharge Plan: [place]   Discharge Planning Services: CM Consult Post Acute Care Choice: [place] Living arrangements for the past 2 months: Single Family Home                                       Social Determinants of Health (SDOH) Interventions    Readmission Risk Interventions No flowsheet data found.

## 2020-04-06 NOTE — Progress Notes (Signed)
Nutrition Brief Note  Chart reviewed. Per palliative care, pt is comfort measures only. Plans for hospice facility. No further nutrition interventions warranted at this time. Please consult as needed.   Corrin Parker, MS, RD, LDN RD pager number/after hours weekend pager number on Amion.

## 2020-04-06 NOTE — TOC Progression Note (Addendum)
Transition of Care [facility]) - Progression Note    Patient Details  Name: Antonio Glenn MRN: [id number] Date of Birth: [birth year]  Transition of Care [facility]) CM/SW [place], [place] Phone Number: 04/06/2020, 3:38 PM  Clinical Narrative:    Patient has no bed offers  CSW contacted - [facility]- no unvaccinated availability  [facility] - waiting on response [facility] -waiting on response  [facility]- declined-  Pt has not received covid vaccines [place]- no unvaccinated availability    CSW will continue to follow and assist with discharge planning.  Thurmond Butts, MSW, LCSW Clinical Social Worker   Expected Discharge Plan: Skilled Nursing Facility Barriers to Discharge: SNF Pending bed offer  Expected Discharge Plan and Services Expected Discharge Plan: [place]   Discharge Planning Services: CM Consult Post Acute Care Choice: [place] Living arrangements for the past 2 months: Single Family Home                                       Social Determinants of Health (SDOH) Interventions    Readmission Risk Interventions No flowsheet data found.

## 2020-04-06 NOTE — Progress Notes (Signed)
Palliative Medicine RN Note: Our PA rec'd an email from pt's family with partial HCPOA document. The portion rec'd confirms that ex wife Antonio Glenn is Antonio Glenn. We are waiting on receipt of page with witness and notary signatures, and will then submit it to HIM for scanning into his chart.  [name], RN, BSN, [facility] Palliative Medicine Team 04/06/2020 1:24 PM Office [phone]

## 2020-04-06 NOTE — Progress Notes (Signed)
OT Cancellation Note  Patient Details Name: Antonio Glenn MRN: [id number] DOB: [birth year]   Cancelled Treatment:    Reason Eval/Treat Not Completed: Other (comment); noted per chart pt has transitioned to comfort measures only. Acute OT to sign off given this transition. Please re-consult should pt's needs change.  Lou Cal, OT Acute Rehabilitation Services Pager [phone] Office [phone]   Raymondo Band 04/06/2020, 3:11 PM

## 2020-04-06 NOTE — Plan of Care (Signed)
  Problem: Education: Goal: Knowledge of General Education information will improve Description Including pain rating scale, medication(s)/side effects and non-pharmacologic comfort measures Outcome: Progressing   

## 2020-04-06 NOTE — Progress Notes (Signed)
Neurosurgery  OK thanks patient seen in examined. Awake, alert, speaking fluent simple sentences.  Oriented to person, hospital. Incision clean, dry.  L parietal lobe GBM - awaiting SNF/hospice

## 2020-04-07 MED ORDER — DIAZEPAM 5 MG PO TABS
10.0000 mg | ORAL_TABLET | Freq: Three times a day (TID) | ORAL | Status: DC
  Administered 2020-04-07 – 2020-04-10 (×9): 10 mg via ORAL
  Filled 2020-04-07 (×9): qty 2

## 2020-04-07 NOTE — Progress Notes (Signed)
Palliative Care Progress Note  Mr. Legate remains stable-there have been no behavioral issues, but he is getting increasingly frustrated being in the hospital. His daughter now reportedly has COVID, she has reportedly called the unit and complained that her Dad is calling her agitated multiple times a day constantly- she wanted to know how he got her number. He is capable of dialing the number accurately and wants to speak with her. She believes that this means his symptoms are not being managed well. When I spoke with her a couple of days ago I agreed that he needed additional sedation for agitation and to help with his pressured speech, sense of panic and urgency. I added oral Diazapam 5mg  TID, this has made almost no impact on his temperament or anxiety.  He really just wants to go home. He is eating his meals, can ambulate to the bathroom with assistance and he is taking his medications. I think his ability to comprehend basic aspects of his care and condition are intact and he is getting frustrated and confused. His language deficits are challenging but he can make simple sentences and fairly reliably answer basic yes no questions.  I do not believe a nursing facility is in his best interest,and he does not qualify for a hospice facility at this time. He has a terminal illness that will continue to cause worsening of his cognition and neurological status and in the very near future cause his death. At the present moment I do not see signs of overt suffering-only suffering because he is remaining in the hospital and because he is confused by his condition and goals of care being determined by his daughter and his ex-wife.   While I believe I can continue to make adjustments to his anxiety medications, I do not see or feel at this time there is an indication for palliative or terminal sedation-he may very well have the capacity to have some joy and quality of life for the time he has left if we can get  him home.   I will continue to adjust his anxiety medications-Will increase valium to 10mg  TID  I strongly recommend that he be discharged home with hospice services-top admit him to service on the day of discharge-this will provide as much support as possible for his care at home- he has a live-in caregiver and that person is currently living at the home caring for Dan's dogs.Family is worried he cant manage Dan- but from my perspective right now he needs basic assistance that doesn't require significant physical stamina or skilled need. If he deteriorates or there are symptom management needs at home hospice is available to assist with 24/7 access to care if needed in the home as well as their facility of his condition needs that level of care.  I have not been able to contact his family this evening-but I will arrange for a conference call with both his daughter and his ex-wife to share with them my recommendation regarding hospice care at home.  Lane Hacker, DO Palliative Medicine  Time: 35 min Greater than 50%  of this time was spent counseling and coordinating care related to the above assessment and plan.

## 2020-04-07 NOTE — Progress Notes (Signed)
Manufacturing engineer [facility]) Community Based Palliative Care  This patient has been referred to our palliative care services in the community. [facility] will continue to follow for any discharge planning needs and to coordinate admission onto palliative care.  Thank you for the opportunity to participate in this patient's care.  Farrel Gordon, RN, [facility]   [facility] Liaison (listed on AMION under Hospice/[phone]

## 2020-04-07 NOTE — Progress Notes (Signed)
Subjective: Patient expresses frustration with being in the hospital  Objective: Vital signs in last 24 hours: Temp:  [97.9 F (36.6 C)-98.1 F (36.7 C)] 97.9 F (36.6 C) (01/20 0835) Pulse Rate:  [84-98] 98 (01/20 0835) Resp:  [18] 18 (01/20 0835) BP: (134-144)/(77-79) 144/79 (01/20 0835) SpO2:  [95 %-97 %] 97 % (01/20 0835)  Intake/Output from previous day: 01/19 0701 - 01/20 0700 In: 600 [P.O.:600] Out: 600 [Urine:600] Intake/Output this shift: Total I/O In: 480 [P.O.:480] Out: -   Awake, alert. Oriented to person FC x 4 Incision well-healed  Lab Results: No results for input(s): WBC, HGB, HCT, PLT in the last 72 hours. BMET No results for input(s): NA, K, CL, CO2, GLUCOSE, BUN, CREATININE, CALCIUM in the last 72 hours.  Studies/Results: No results found.  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

## 2020-04-08 NOTE — Progress Notes (Signed)
Subjective: NAEs o/n  Objective: Vital signs in last 24 hours: Temp:  [97.6 F (36.4 C)-98 F (36.7 C)] 97.6 F (36.4 C) (01/21 1156) Pulse Rate:  [95] 95 (01/20 2130) Resp:  [17-18] 18 (01/21 1156) BP: (152)/(107) 152/107 (01/20 2130) SpO2:  [100 %] 100 % (01/21 1156)  Intake/Output from previous day: 01/20 0701 - 01/21 0700 In: 480 [P.O.:480] Out: 800 [Urine:800] Intake/Output this shift: No intake/output data recorded.   Awake, alert, oriented to person, hospital. Speech fluent with simple rote phrases but significant word finding difficulty and semantic paraphasias with more complex communication. Incision c/d  Lab Results: No results for input(s): WBC, HGB, HCT, PLT in the last 72 hours. BMET No results for input(s): NA, K, CL, CO2, GLUCOSE, BUN, CREATININE, CALCIUM in the last 72 hours.  Studies/Results: No results found.  Assessment/Plan: L parietal GBM s/p craniotomy - I had a long discussion with Dr. Hilma Favors of   Palliative care yesterday.  We both agree that patient's quality of life would be greatest if he were to go home with home hospice services as needed.   The alternative of going to a nursing facility would offer are significantly less quality of life, and although his language is impaired, the patient who expressed a desire to go home.  If his family still is not comfortable with that, then he will go to a SNF.  [name] 04/08/2020, 1:03 PM

## 2020-04-08 NOTE — Progress Notes (Signed)
Inpatient Rehabilitation Admissions Coordinator  Asked by [facility] RN CM, Kristi to re eval patient for possible Cir admit per request Dr. Hilma Favors. We originally consulted on 03/08/20 and I spoke with his daughter. She and her Mom verified that they were unable to provide 24/7 care of patient which is needed. Noted acute therapy has signed off for he has transitioned to comfort care. CIR not likely to change the need for 24/7 min assist level of care due to his diagnosis and prognosis. . I have updated TOC and Dr. Hilma Favors of my recommendations.  Danne Baxter, RN, MSN Rehab Admissions Coordinator [phone] 04/08/2020 2:44 PM

## 2020-04-09 NOTE — Progress Notes (Signed)
Pt had a visitor named Beverlee Nims who said that she was the pt's "sweetheart." Beverlee Nims said that she had not been allowed to visit by pt's family members prior to this and that a nurse last night said that she could come today. She also asked questions about pt prognosis and this RN told her that she could not give her that information as it is confidential and only [name] and [name] are given information from the hospital. Beverlee Nims was understanding of this.   Justice Rocher, RN

## 2020-04-09 NOTE — Progress Notes (Signed)
  NEUROSURGERY PROGRESS NOTE   No issues overnight.   EXAM:  BP (!) 145/109 (BP Location: Right Arm)   Pulse 93   Temp 98.5 F (36.9 C) (Axillary)   Resp 17   Ht 6\' 3"  (1.905 m)   Wt 126.4 kg   SpO2 100%   BMI 34.83 kg/m   Awake, alert, oriented to self  Speech fluent but perseverates on going home MAEW Incision c/d/i  PLAN Stable Continued discussions regarding dispo Appreciate palliative cares assistance

## 2020-04-09 NOTE — Progress Notes (Signed)
Palliative Care Progress Note  Antonio Glenn remains stable. He is able to eat and ambulate with assistance. I had a detailed discussion of his condition, prognosis and barriers related to his care coordination and discharge planning with his daughter.   Family hs agreed that he can discharge home with hospice servcies. Unfortunately both his daughter and his ex-wife (his only family and support) have active and symptomatic COVID 19 infection. Earliest they can be available to assist with his transition home is Wednesday 1/26.  Until then I recommend continued scheduled valium, and to assist him with mobilization. We should to the degree possible engage him in working towards a [place] with his terminal diagnosis he could benefit from a QOL perspective from ongoing activity and therapy to maximize his function- especially as an outlet for his anxiety and agitation. Will put in another PT order but recognize this is atypical and unique request for therapy- in his current state I consider it a comfort measure for him.  Lane Hacker, DO Palliative Medicine

## 2020-04-09 NOTE — Progress Notes (Addendum)
Daily Progress Note   Patient Name: Antonio Glenn       Date: 04/09/2020 DOB: 03-Nov-1960  Age: 60 y.o. MRN#: [id number] Attending Physician: [name], MD Primary Care Physician: Leonard Downing, MD Admit Date: 02/11/2020  Reason for Consultation/Follow-up: To discuss complex medical decision making related to patient's goals of care  Subjective: Talked with patient at bedside.  He asks repeatedly to go home.  No complaints of pain, constipation, hunger.  He is eager for discharge to home.  I spoke with his Irven Coe on the phone.   Both [name] and [name] have tested positive for COVID.  [name] also explains that Kasandra Knudsen has a very soft spot in his bedroom floor that will need to be repaired before it is safe for him to return home.  Discussed the ultimate plan for for Danny to go home to be watched over by his close friend JB and to be cared for by Hospice.   [name] is agreeable.   Assessment: Patient calm, no complaints.  He does have word finding difficulty.   Patient Profile/HPI:   60 y.o.malewith past medical history of obesityadmitted on 11/25/2021with seizures and confusion.Found to have brain mass s/p craniotomy 12/1. Pathology consistent with GBM, grade 4. Patient has required core track - now tolerating some PO intake. Also with agitation and requiring restraints. PMT consulted to discuss [place].    Length of Stay: 58   Vital Signs: BP (!) 145/109 (BP Location: Right Arm)   Pulse 93   Temp 98.5 F (36.9 C) (Axillary)   Resp 17   Ht 6\' 3"  (1.905 m)   Wt 126.4 kg   SpO2 100%   BMI 34.83 kg/m  SpO2: SpO2: 100 % O2 Device: O2 Device: Room Air O2 Flow Rate: O2 Flow Rate (L/min): 4 L/min       Palliative Assessment/Data: 50%     Palliative Care  Plan    Recommendations/Plan:  Home with Hospice services as soon as arrangements can be made with Hospice and family.  TOC order placed for Hospice services in the home.  Code Status:  DNR  Prognosis:  Weeks to months given rapidly growing GBM  Discharge Planning:  Home with Hospice  Care plan was discussed with patient and [name] [facility])  Thank you for allowing the Palliative Medicine  Team to assist in the care of this patient.  Total time spent:  35 min.     Greater than 50%  of this time was spent counseling and coordinating care related to the above assessment and plan.  Florentina Jenny, PA-C Palliative Medicine  Please contact Palliative MedicineTeam phone at [phone] for questions and concerns between 7 am - 7 pm.   Please see AMION for individual provider pager numbers.

## 2020-04-09 NOTE — Progress Notes (Signed)
No Phenobarbitol in pt bin or pyxis. Medication sent by pharmacy via tube system. 1 syringe leaked during transport. Another syringe was sent. Pt given full dose. Leaked syringe wasted in pt room sharps bin with Katherina Right, RN.   Justice Rocher, RN

## 2020-04-09 NOTE — Progress Notes (Signed)
Pt ambulated out in hall with the assist of 2 RNs. Pt drowsy and unsteady, requiring breaks and cueing.   Justice Rocher, RN

## 2020-04-10 DIAGNOSIS — R509 Fever, unspecified: Secondary | ICD-10-CM

## 2020-04-10 MED ORDER — CLONAZEPAM 1 MG PO TABS
2.0000 mg | ORAL_TABLET | Freq: Two times a day (BID) | ORAL | Status: DC
Start: 2020-04-10 — End: 2020-04-11
  Administered 2020-04-10 – 2020-04-11 (×2): 2 mg via ORAL
  Filled 2020-04-10 (×2): qty 2

## 2020-04-10 NOTE — Progress Notes (Signed)
Patient given Tylenol given for temp. Body / hair washed.  Patient sleeping at this time.

## 2020-04-10 NOTE — Progress Notes (Signed)
Daily Progress Note   Patient Name: Antonio Glenn       Date: 04/10/2020 DOB: Jun 22, 1960  Age: 60 y.o. MRN#: [id number] Attending Physician: [name], MD Primary Care Physician: Leonard Downing, MD Admit Date: 02/11/2020  Reason for Consultation/Follow-up: symptom management  Discussed with beside RN and RN Tech.  Appreciate their excellent care of Antonio Glenn. Kasandra Knudsen had a fever of 101 today and was more agitated than usual despite receiving ample PRN doses of Olanzapine, valium, ativan, tylenol  Subjective: I talked with Antonio Glenn at bedside he continues to have expressive aphasia.  He is sad and frustrated.  He only wants to go home.   I spent time talking with him in an attempt to calm him down.   Assessment: Fever 101 and increased agitation   Patient Profile/HPI: 60 yo male with rapidly progressive GBM.  Resected but is quickly reforming.  Patient not appropriate for oncologic treatment.     Length of Stay: 59   Vital Signs: BP (!) 152/81 (BP Location: Left Wrist)   Pulse 85   Temp 99.4 F (37.4 C) (Oral)   Resp 18   Ht 6\' 3"  (1.905 m)   Wt 126.4 kg   SpO2 100%   BMI 34.83 kg/m  SpO2: SpO2: 100 % O2 Device: O2 Device: Room Air O2 Flow Rate: O2 Flow Rate (L/min): 4 L/min       Palliative Assessment/Data: 40%     Palliative Care Plan    Recommendations/Plan: Per RN valium seemed to wear off very quickly.  Will use MD calc to convert valium dosing to longer lasting Klonopin. Will continue to closely follow with you. Plan is for family to allow him to return to his home with hospice. If agitation can not be controlled or fever worsens patient may require hospice facility.  Code Status:  DNR  Prognosis: Weeks to months. Could be much less if fever is  signalling potential sepsis or if agitation continues to worsen.  Discharge Planning: To Be Determined Home with Hospice vs Hospice Facility.  Care plan was discussed with RN  Thank you for allowing the Palliative Medicine Team to assist in the care of this patient.  Total time spent:  35 min.     Greater than 50%  of this time was spent counseling and coordinating  care related to the above assessment and plan.  Florentina Jenny, PA-C Palliative Medicine  Please contact Palliative MedicineTeam phone at [phone] for questions and concerns between 7 am - 7 pm.   Please see AMION for individual provider pager numbers.

## 2020-04-10 NOTE — TOC Progression Note (Signed)
Transition of Care (TOC) - Progression Note  Marvetta Gibbons RN,BSN Transitions of Care Unit 4NP (non trauma) - RN Case Manager See Treatment Team for direct Phone #   Patient Details  Name: Antonio Glenn MRN: [id number] Date of Birth: [birth year]  Transition of Care [facility]) CM/SW Contact  [name], [name], RN Phone Number: 04/10/2020, 2:55 PM  Clinical Narrative:    Noted referral for Home Hospice needs, call made to [place] to discuss transition plan for home with hospice- per [name]- both her and daughter work and do not live close to patient - will only be able to assist intermittently. Patient's friend who is currently living at the home to take care of the dogs has agreed to stay there and help take care of patient post discharge. PTA pt was living alone. Per [name], there is a soft spot in the bedroom floor that needs to be repaired prior to pt coming home - she has made arrangements for this to be repaired this coming Wednesday.  Choice offered for [place] agency Per Medicare.gov- discussed Home hospice services and per [name] she is familiar with [facility] and would like to use them for Hospice needs. Discussed potential DME needs- pt will need RW for home- may wait on hospital bed- but will discuss with hospice when they call.  [name] reports that she and the daughter will need to make arrangements to come pick patient up when things are in place for transition home.   Call made to [place] for home hospice referral- spoke with Tharon Aquas who will f/u on referral.    Expected Discharge Plan: Skilled Nursing Facility Barriers to Discharge: Family Issues  Expected Discharge Plan and Services Expected Discharge Plan: [place]   Discharge Planning Services: CM Consult Post Acute Care Choice: Hospice Living arrangements for the past 2 months: [place] Date [place]: 04/10/20 Time [place]: 1455 Representative spoke with at [place]: [place] Interventions    Readmission Risk Interventions No flowsheet data found.

## 2020-04-10 NOTE — Progress Notes (Addendum)
Patient developing worsening apraxia with decreased ability to follow instructions.  Generalized fine motor tremors noted, particularly while ambulating, which is increasing his fall risk.  Having expressive and receptive aphasia.  Often, unable to make sense of what patient is attempting to say.  He is able to answer simple "yes" and "no" questions appropriately.  Continues to holler out.  Attempt made to toilet patient every 3 hours with marked success.  Patient requires constant cueing, however.  For instance, when asked to "sit on the bed", Pt stated he did not know what I was saying.  Therefore, he decided to sit about 6 inches prior to the bed, and sat on my knee.  A passing nurse tech assisted me in getting him the rest of the way back to bed.

## 2020-04-10 NOTE — Progress Notes (Signed)
  NEUROSURGERY PROGRESS NOTE   No issues overnight.   EXAM:  BP (!) 164/147 (BP Location: Right Arm)   Pulse (!) 103   Temp 98.5 F (36.9 C)   Resp 20   Ht 6\' 3"  (1.905 m)   Wt 126.4 kg   SpO2 100%   BMI 34.83 kg/m   Awake, alert, oriented to self  Speech fluent but perseverates on going home MAEW Incision c/d/i  IMPRESSION/PLAN Stable Plan for discharge home when safe (soft spot in floor, family with COVID) with home hospice per palliative discussion with family. Greatly Appreciate their assistance.

## 2020-04-10 NOTE — Evaluation (Signed)
Physical Therapy Evaluation Patient Details Name: Antonio Glenn MRN: [id number] DOB: [birth year] Today's Date: 04/10/2020   History of Present Illness  Antonio Glenn is a 60 y.o. male with hx of recently diagnosed brain tumor who was transferred from [place] ED to [facility] 11/25 with AMS and seizures.CRANIOTOMY OF  LEFT PARIETAL TUMOR 12/1 and on vent, trach off vent 12/15. MRI 03/30/20 showed tumor regrowth; increasing agitation; move to comfort care (and appears therapy orders discontinued) however pt is not inpatient Hospice appropriate and awaiting SNF vs home if caregiver can manage him at home  Clinical Impression   Pt admitted with above diagnosis. PT was discontinued when pt was moving to comfort care. He remains hospitalized due to lack of facility that can manage his periods of agitation. Unclear what level of assist his caregiver can provide at home and if agitation will improve if he was in his own home. PT re-consulted to be part of this pateint's "comfort measures" per MD/Palliative note. Patient's balance and use of RLE/strength have improved since he was last seen by PT. He was cooperative and followed most commands (having difficulty with anything involving rt side or rt field of vision). He is agreeable to using a RW and requires assist to maneuver around obstacles on his left and occasionally for placing his right hand back on the walker. Agree PT may have a role in facilitating a discharge plan for patient. Feel he may be able to learn to use RW more safely which may improve a caregiver's ability to manage him at home.  Pt currently with functional limitations due to the deficits listed below (see PT Problem List). Pt may benefit from skilled PT to increase their independence and safety with mobility to allow discharge to the venue listed below.       Follow Up Recommendations Other (comment) (home with hospice if caregiver 24/7 can provide min assist)    Equipment Recommendations   Rolling walker with 5" wheels;Hospital bed    Recommendations for Other Services       Precautions / Restrictions Precautions Precautions: Fall Precaution Comments: R inattention      Mobility  Bed Mobility Overal bed mobility: Needs Assistance Bed Mobility: Supine to Sit;Sit to Supine Rolling: Supervision   Supine to sit: Supervision Sit to supine: Independent   General bed mobility comments: pt observed to get himself to side of bed (setting off bed alarm); on return to bed pt did not require assist and rolled from his rt side over to his left    Transfers Overall transfer level: Needs assistance Equipment used: Rolling walker (2 wheeled) Transfers: Sit to/from Stand Sit to Stand: Min guard         General transfer comment: cues for safety, but pt doesn't usually follow the instruction verbally without further cuing.  Ambulation/Gait Ambulation/Gait assistance: Min assist Gait Distance (Feet): 50 Feet (x2; seated rest) Assistive device: Rolling walker (2 wheeled);None Gait Pattern/deviations: Step-through pattern;Decreased step length - right;Decreased step length - left;Decreased stride length;Drifts right/left;Trunk flexed Gait velocity: decr   General Gait Details: pt overall unsteady, drifting/fading R,  Assist for stability, assist with R hand on the RWand to maneuver around objects on his right. Cues for direction.  Stairs            Wheelchair Mobility    Modified Rankin (Stroke Patients Only)       Balance Overall balance assessment: Needs assistance Sitting-balance support: No upper extremity supported Sitting balance-Leahy Scale: [place] Sitting  balance - Comments: pt able to sustain without UE support   Standing balance support: During functional activity Standing balance-Leahy Scale: Poor Standing balance comment: slight anterior bias                             Pertinent Vitals/Pain Pain Assessment: Faces Faces Pain Scale:  No hurt    Home Living Family/patient expects to be discharged to:: Unsure (home with caregiver 24/7 vs SNf)                      Prior Function Level of Independence: Independent         Comments: per chart, pt independent prior to admission until recently. Pt unable to give reliable or consistent info regarding prior function.     Hand Dominance        Extremity/Trunk Assessment   Upper Extremity Assessment RUE Deficits / Details: neglects R hand needs max cues able to initiate with visual attention RUE Coordination: decreased fine motor;decreased gross motor    Lower Extremity Assessment RLE Deficits / Details: able to bear weight without buckling    Cervical / Trunk Assessment Cervical / Trunk Assessment: Other exceptions Cervical / Trunk Exceptions: large body habitus  Communication   Communication: Expressive difficulties (word finding difficulty, tangential)  Cognition Arousal/Alertness: Awake/alert Behavior During Therapy: Restless Overall Cognitive Status: Impaired/Different from baseline Area of Impairment: Orientation;Attention;Memory;Following commands;Safety/judgement;Awareness;Problem solving                 Orientation Level: Place;Time;Situation Current Attention Level: Sustained Memory: Decreased short-term memory Following Commands: Follows one step commands inconsistently Safety/Judgement: Decreased awareness of safety;Decreased awareness of deficits Awareness: Intellectual Problem Solving: Slow processing;Difficulty sequencing;Requires verbal cues;Requires tactile cues General Comments: Pt oriented to self only; could not recall location or why he was in hospital after 2-3 minutes      General Comments      Exercises     Assessment/Plan    PT Assessment Patient needs continued PT services  PT Problem List Decreased strength;Decreased range of motion;Decreased activity tolerance;Decreased balance;Decreased mobility;Decreased  coordination;Decreased cognition;Decreased knowledge of use of DME;Decreased safety awareness;Obesity;Decreased knowledge of precautions       PT Treatment Interventions DME instruction;Gait training;Stair training;Functional mobility training;Therapeutic activities;Therapeutic exercise;Balance training;Neuromuscular re-education;Patient/family education    PT Goals (Current goals can be found in the Care Plan section)  Acute Rehab PT Goals Patient Stated Goal: unable; agrees he wants to walk more PT Goal Formulation: Patient unable to participate in goal setting Time For Goal Achievement: 04/24/20 Potential to Achieve Goals: Fair    Frequency Min 3X/week   Barriers to discharge Decreased caregiver support      Co-evaluation PT/OT/SLP Co-Evaluation/Treatment: Yes             AM-PAC PT "6 Clicks" Mobility  Outcome Measure Help needed turning from your back to your side while in a flat bed without using bedrails?: A Little Help needed moving from lying on your back to sitting on the side of a flat bed without using bedrails?: A Little Help needed moving to and from a bed to a chair (including a wheelchair)?: A Little Help needed standing up from a chair using your arms (e.g., wheelchair or bedside chair)?: A Little Help needed to walk in hospital room?: A Little Help needed climbing 3-5 steps with a railing? : A Little 6 Click Score: 18    End of Session Equipment Utilized During Treatment: Gait  belt Activity Tolerance: Patient tolerated treatment well Patient left: in bed;with call bell/phone within reach;with bed alarm set Nurse Communication: Mobility status PT Visit Diagnosis: Other abnormalities of gait and mobility (R26.89) Hemiplegia - Right/Left: Right Hemiplegia - dominant/non-dominant: Dominant Hemiplegia - caused by: Unspecified Pain - part of body:  (general)    Time: 9323-5573 PT Time Calculation (min) (ACUTE ONLY): 25 min   Charges:   PT  Evaluation $PT Eval Moderate Complexity: 1 Mod PT Treatments $Gait Training: 8-22 mins         Arby Barrette, PT Pager [phone]   Rexanne Mano 04/10/2020, 12:36 PM

## 2020-04-11 MED ORDER — LORAZEPAM 2 MG/ML PO CONC
2.0000 mg | ORAL | Status: DC
  Administered 2020-04-11 – 2020-04-12 (×7): 2 mg via SUBLINGUAL
  Filled 2020-04-11 (×7): qty 1

## 2020-04-11 MED ORDER — HALOPERIDOL LACTATE 2 MG/ML PO CONC
5.0000 mg | Freq: Two times a day (BID) | ORAL | Status: DC
  Administered 2020-04-11 – 2020-04-12 (×3): 5 mg via ORAL
  Filled 2020-04-11 (×5): qty 2.5

## 2020-04-11 MED ORDER — OXYCODONE HCL 20 MG/ML PO CONC: 10.0000 mg | ORAL | Status: DC | PRN

## 2020-04-11 MED ORDER — OLANZAPINE 5 MG PO TBDP
10.0000 mg | ORAL_TABLET | Freq: Two times a day (BID) | ORAL | Status: DC
  Administered 2020-04-11 – 2020-04-12 (×3): 10 mg via ORAL
  Filled 2020-04-11 (×3): qty 2

## 2020-04-11 NOTE — Progress Notes (Signed)
Pt had multiple bouts of agitation, constant attempts to get out of bed, external catheter pulled again. Ex wife updated.

## 2020-04-11 NOTE — Progress Notes (Signed)
Palliative Care Progress Note  Increasing agitation, yelling out, worsening neurological status, fever over the weekend. IV access lost. Will schedule Ativan intensol 2mg  q4 hours. Increased haldol to 5mg  SL BID. Difficult to manage symptoms. At this point, I believe he would best be served by an inpatient hospice facility and will requiring increasing amounts of sedation and complex symptom management related to his rapidly growing glioblastoma and neurological deterioration.  -Request re-review for Hospice IPU  -Very high level needs for symptom management and is demonstrating signs of terminal decline.  Lane Hacker, DO Palliative Medicine   Time: 20 minutes Greater than 50%  of this time was spent counseling and coordinating care related to the above assessment and plan.

## 2020-04-12 NOTE — Progress Notes (Signed)
PTAR at bedside, all patient's belongings sent with patient to receiving facility.  No IV present, report given to [facility] at [facility] in [place].  DNR in discharge packet

## 2020-04-12 NOTE — Discharge Summary (Signed)
Physician Discharge Summary  Patient ID: Antonio Glenn MRN: [id number] DOB/AGE: 1961/01/20 60 y.o.  Admit date: 02/11/2020 Discharge date: 04/12/2020  Admission Diagnoses:  Encephalopathy  Discharge Diagnoses:  Same Active Problems:   Altered mental status   Seizures ([place])   Encephalopathy acute   Brain mass   New onset a-fib ([place])   Respiratory failure ([facility])   HAP (hospital-acquired pneumonia)   HTN (hypertension)   Encounter for imaging study to confirm orogastric (OG) tube placement   Discharged Condition: Stable  Hospital Course:  Antonio Glenn is a 60 y.o. male who developed progressive cognitive difficulties and language disturbance and was found to have a left parietal lobe mass suggestive of a primary malignant brain neoplasm.  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  His EEG showed no epilepsy, but he had numerous spikes emanating from his left parietal lobe.  He remained profoundly encephalopathic with bouts of significant agitation requiring restraints.  For purposes of diagnosis as well as for potential seizure control, he underwent craniotomy for resection of the parietal lobe tumor.  Gross total resection was achieved and confirmed on postoperative MRI performed on postoperative day #1.  For purposes of blood pressure control and given his poor neurologic status prior to surgery, patient remained intubated.  He required large amounts of medication for sedation and this was eventually weaned and he was successfully extubated.  Patient's mental status slowly improved and he was downgraded to the stepdown unit.  He was noted to have some receptive aphasia, with difficulty with more complex language structures and some word finding difficulty.  His oral diet was advanced and his feeding tube was removed.  Initially, rehab was planned for him but as patient did  not have a good disposition from rehab, SNF was recommended.  SNF facility was not able to be secured due to variety of difficulties, including Covid saturation and since the patient was not vaccinated.  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 Mickeal Skinner of neurology discussed with the patient's family treatment options.  At this point, the patient had been in the hospital for 1 month postop so a follow-up MRI was performed.  This showed progression of his infiltrative disease over the span of just 1 month.  At that point, in accordance with the patient's previously known wishes, the family wished to change the goals of care is to palliative care.  He was evaluated by several inpatient hospices but as he had no uncontrolled symptoms, he was not deemed a candidate for inpatient hospice.  Home hospice was being arranged with the daughter and his ex-wife, but unfortunately they contracted Covid and were in convalescence and unable to accept him.  Patient remained in the hospital and grew more frustrated.  He then began to develop more agitation and increasing requirements for sedating medications and antipsychotics.  At this point, he was deemed a candidate for inpatient hospice.  Treatments: Surgery -left craniotomy  Discharge Exam: Blood pressure 123/81, pulse 96, temperature 98.7 F (37.1 C), temperature source Oral, resp. rate 20, height 6' 3"  (1.905 m), weight 126.4 kg, SpO2 98 %. Awake, oriented only to person.  Slightly drowsy, with speech slurred.  Speaks simple phrases but difficulty with more complex sentences.  Follows commands x4. CN grossly intact 5/5 BUE/BLE Wound c/d/i  Disposition: Discharge disposition: 51-Hospice/Medical Facility          Follow-up Information  [facility] Follow up.   Why: Home Hospice referral-- pt to go to Hospice facility in [facility] information: [address] [place]  [address] [phone]              Signed: [name] 04/12/2020, 5:33 PM

## 2020-04-12 NOTE — Progress Notes (Signed)
PT Cancellation Note  Patient Details Name: Antonio Glenn MRN: [id number] DOB: August 25, 1960   Cancelled Treatment:    Reason Eval/Treat Not Completed: Fatigue/lethargy limiting ability to participate  Patient is sound asleep. Noted he is to discharge today (RN confirmed). Will keep on caseload in case discharge does not happen and check on tomorrow.   Arby Barrette, PT Pager [phone]  Rexanne Mano 04/12/2020, 3:01 PM

## 2020-04-12 NOTE — Plan of Care (Signed)
  Problem: Education: Goal: Knowledge of General Education information will improve Description Including pain rating scale, medication(s)/side effects and non-pharmacologic comfort measures Outcome: Progressing   Problem: Health Behavior/Discharge Planning: Goal: Ability to manage health-related needs will improve Outcome: Progressing   

## 2020-04-12 NOTE — Progress Notes (Addendum)
2030- patient was agitated and yelling out. Nurse gave schedule meds (see mar). Nurse feed patient his dinner which he ate about 50 percent of.   2330-patient was being more agitated and restless. Trying to climb out of bed multiple times, removing brief and peeing into floor. Nurse clean patient up, applied mittens to hands and put on external catheter back on and gave scheduled 0000.   0030- Patient calmed down and slept from approximately 0030-0500.   600- patient is yelling out again and attempting to climb out of bed by throwing legs over the bed rails.

## 2020-04-12 NOTE — TOC Transition Note (Signed)
Transition of Care (TOC) - CM/SW Discharge Note Marvetta Gibbons RN,BSN Transitions of Care Unit 4NP (non trauma) - RN Case Manager See Treatment Team for direct Phone #   Patient Details  Name: Antonio Glenn MRN: [id number] Date of Birth: [birth year]  Transition of Care [facility]) CM/SW Contact:  Dawayne Patricia, RN Phone Number: 04/12/2020, 3:24 PM   Clinical Narrative:    Reached out to Cheri with [facility] this am regarding re-eval for [facility]- have received word from [place] that pt has been approved for [facility] and has a bed available in [place]. HCPOA-[name] Tamala Julian has been contacted and she has accepted the bed offer for the [facility]. Paperwork has been completed and the Hospice Home is ready to accept pt today.  Pt will transport via [facility], will need GOLD DNR signed, paperwork has been placed on shadow chart.   [facility] called for transport at 1445pm, they are running behind at this point in time- Hospice is aware and will expect pt later tonight- they do accept admission 24/7 so pt can transport whenever [facility] arrives.   [name]- has been updated by [facility] with Hospice.   Phone # for RN to call report to [facility] in [phone], (address is [address] [place])  Final next level of care: [place] Barriers to Discharge: Barriers Resolved   Patient Goals and CMS Choice Patient states their goals for this hospitalization and ongoing recovery are:: return home with hospice CMS Medicare.gov Compare Post Acute Care list provided to:: Patient Represenative (must comment) (HCPOA) Choice offered to / list presented to : [place] / [place] [name])  Discharge Placement                 [place]       Discharge Plan and Services   Discharge Planning Services: CM Consult Post Acute Care Choice: Hospice                      [facility] Agency: [place] Date [place]:  04/12/20 Time [place]: 94 Representative spoke with at [place]: [place] ([place]) Interventions     Readmission Risk Interventions Readmission Risk Prevention Plan 04/12/2020  Transportation Screening Complete  PCP or Specialist Appt within 5-7 Days (No Data)  Home Care Screening Complete  Medication Review (RN CM) Complete  Some recent data might be hidden

## 2020-04-12 NOTE — Progress Notes (Signed)
Palliative Medicine RN Note: Signed gold DNR form tubed to [facility].  Marjie Skiff Broadus Costilla, RN, BSN, [facility] Palliative Medicine Team 04/12/2020 3:10 PM Office [phone]

## 2020-04-14 ENCOUNTER — Telehealth: Payer: Self-pay | Admitting: Radiation Therapy

## 2020-04-19 NOTE — Telephone Encounter (Signed)
Received a call from [facility] to inform us that [name] passed away this morning @ 1:00am.   Date of Death: May 07, 2020.   Antonio Dutton R.T.(R)(T) Radiation Special Procedures Navigator

## 2020-04-19 DEATH — deceased

## 2022-03-09 IMAGING — MR MR HEAD WO/W CM
11 series · 48 of 48 positions shown · IV contrast (gadavist)
Comparison: Head CT 01/22/2020

CLINICAL DATA: Brain mass.  Memory loss.

EXAM:
MRI HEAD WITHOUT AND WITH CONTRAST
TECHNIQUE: Multiplanar, multiecho pulse sequences of the brain and surrounding
structures were obtained without and with intravenous contrast.
CONTRAST:  10mL GADAVIST GADOBUTROL 1 MMOL/ML IV SOLN

[Series 2: T1 · sagittal · 5.0mm · 0.90mm/px · 3 of 27 slices shown (1 of 3)]
[im 1/27]
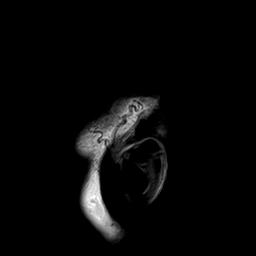
[im 14/27]
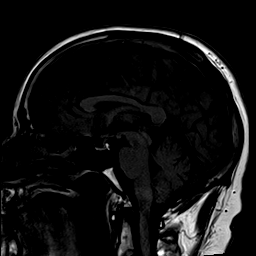
[im 27/27]
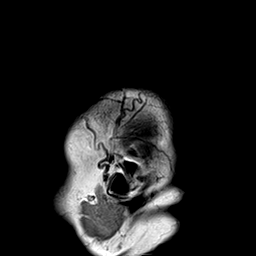

[Series 3: DWI · axial · 3.0mm · 1.88mm/px · z∈[-52,+107]mm · 8 of 99 slices shown (1 of 2)]
[im 1/99]
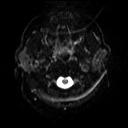
[im 15/99]
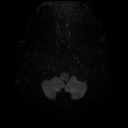
[im 29/99]
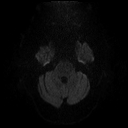
[im 43/99]
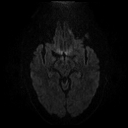
[im 57/99]
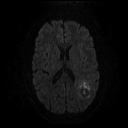
[im 71/99]
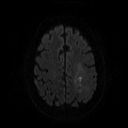
[im 85/99]
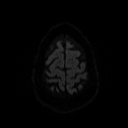
[im 99/99]
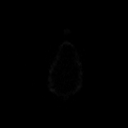

[Series 4: DWI · axial · 3.0mm · 1.88mm/px · z∈[-52,+107]mm · 4 of 50 slices shown (2 of 2)]
[im 1/50]
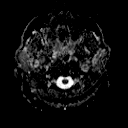
[im 17/50]
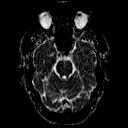
[im 33/50]
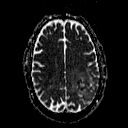
[im 50/50]
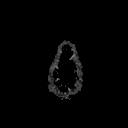

[Series 5: T2 · axial · 5.0mm · 0.75mm/px · z∈[-59,+113]mm · 2 of 30 slices shown (1 of 3)]
[im 1/30]
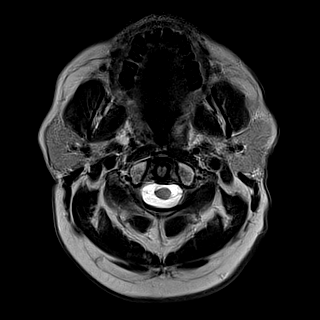
[im 30/30]
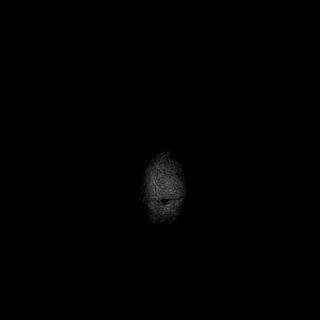

[Series 6: T2 · axial · 5.0mm · 0.47mm/px · z∈[-59,+113]mm · 2 of 30 slices shown (2 of 3)]
[im 1/30]
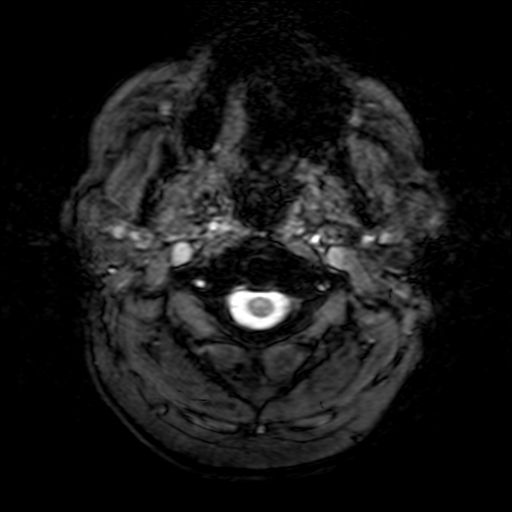
[im 30/30]
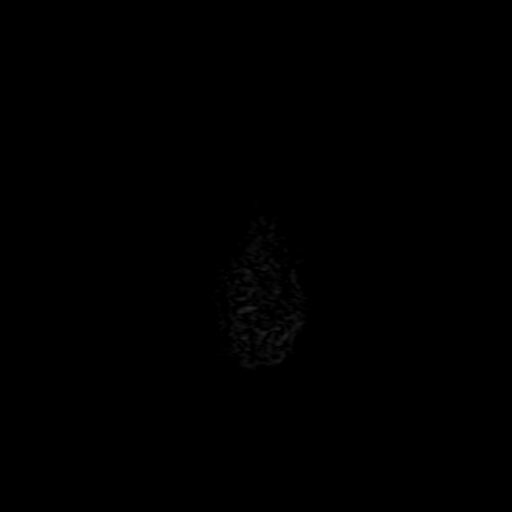

[Series 7: FLAIR · axial · 3.0mm · 0.47mm/px · z∈[-54,+108]mm · 3 of 42 slices shown]
[im 1/42]
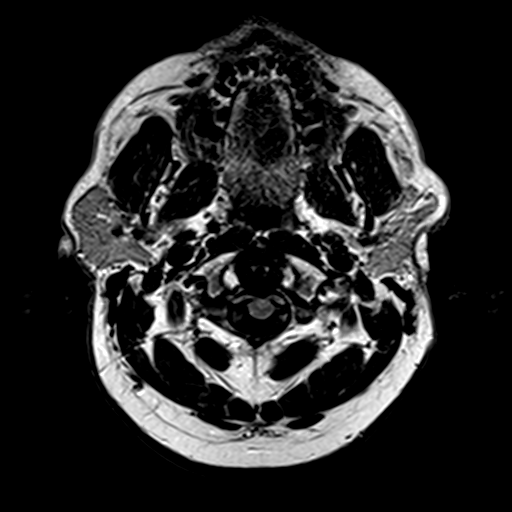
[im 21/42]
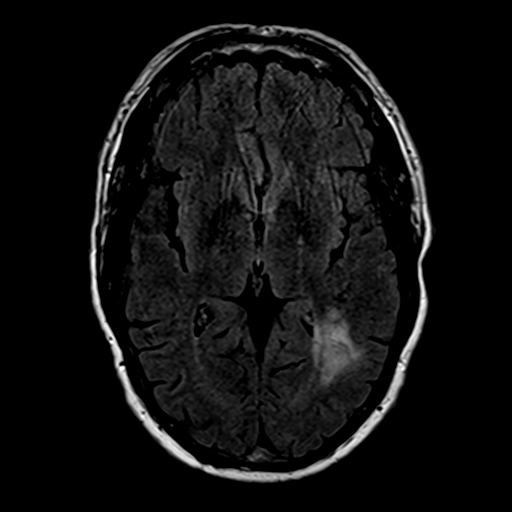
[im 42/42]
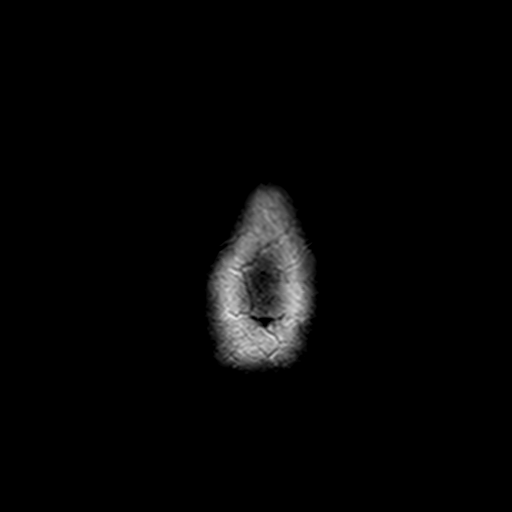

[Series 8: t1_3d_tra · axial · 2.0mm · 1.00mm/px · z∈[-69,+135]mm · 9 of 104 slices shown]
[im 1/104]
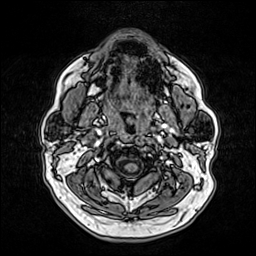
[im 13/104]
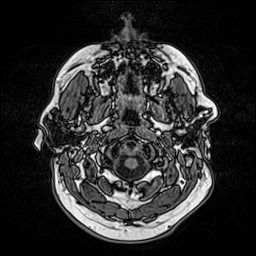
[im 26/104]
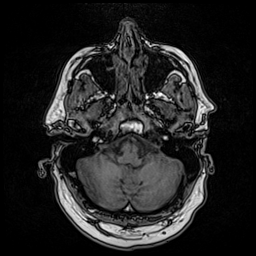
[im 39/104]
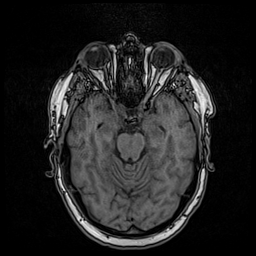
[im 52/104]
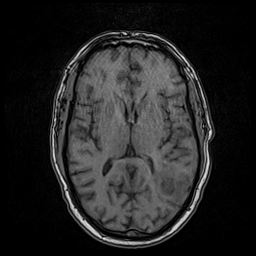
[im 65/104]
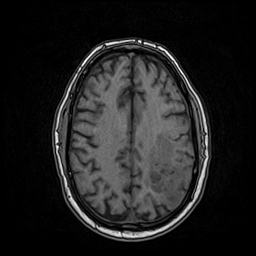
[im 78/104]
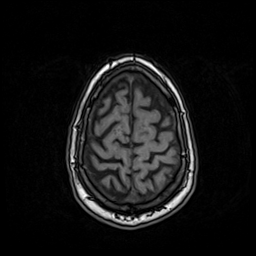
[im 91/104]
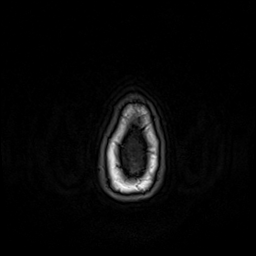
[im 104/104]
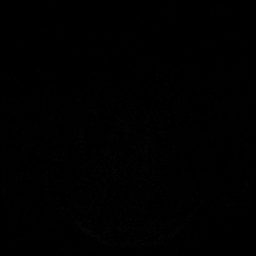

[Series 10: T2 · coronal · 5.0mm · 0.69mm/px · 3 of 34 slices shown (3 of 3)]
[im 1/34]
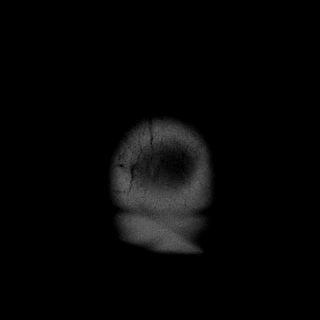
[im 17/34]
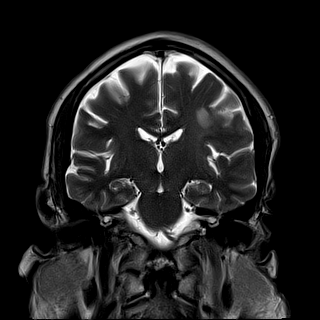
[im 34/34]
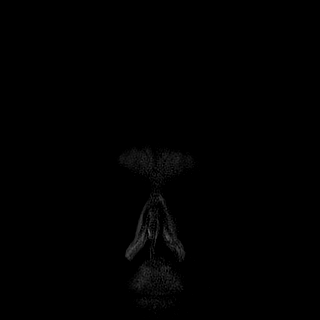

[Series 11: t1_3d_tra +c · axial · 2.0mm · 1.00mm/px · z∈[-69,+135]mm · 9 of 104 slices shown]
[im 1/104]
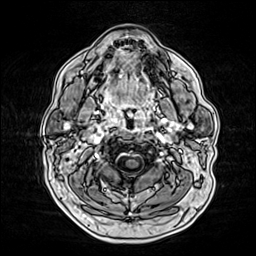
[im 13/104]
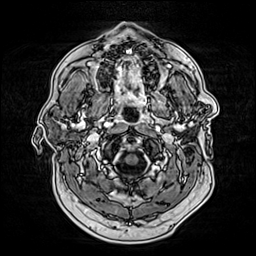
[im 26/104]
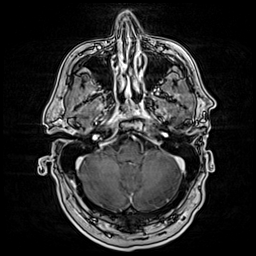
[im 39/104]
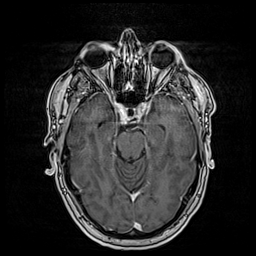
[im 52/104]
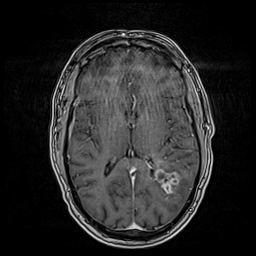
[im 65/104]
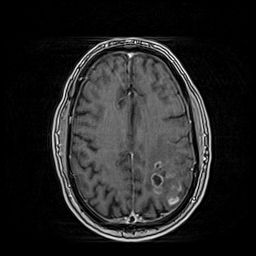
[im 78/104]
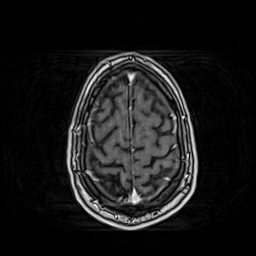
[im 91/104]
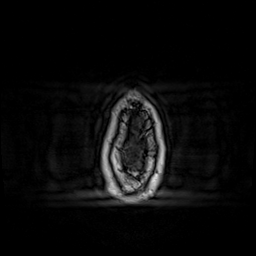
[im 104/104]
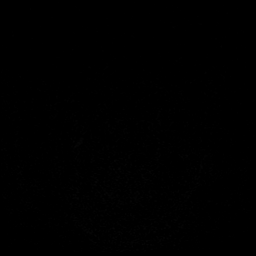

[Series 12: T1 · coronal · 5.0mm · 0.86mm/px · 3 of 34 slices shown (2 of 3)]
[im 1/34]
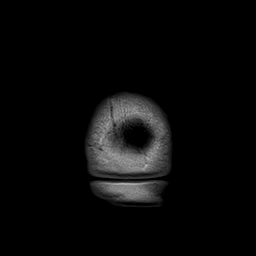
[im 17/34]
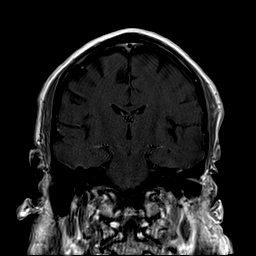
[im 34/34]
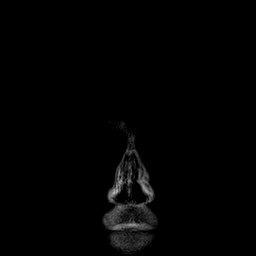

[Series 13: T1 · sagittal · 5.0mm · 0.45mm/px · 2 of 27 slices shown (3 of 3)]
[im 1/27]
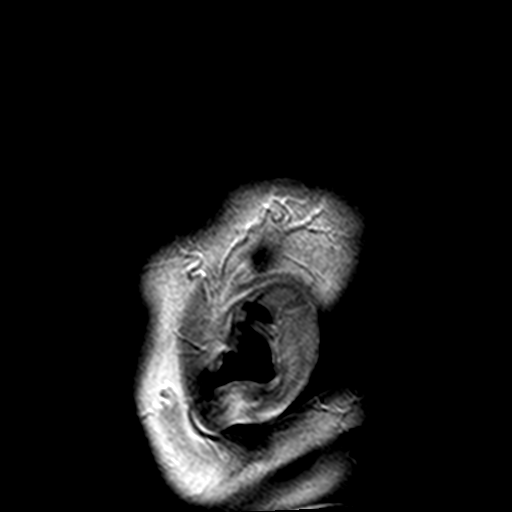
[im 27/27]
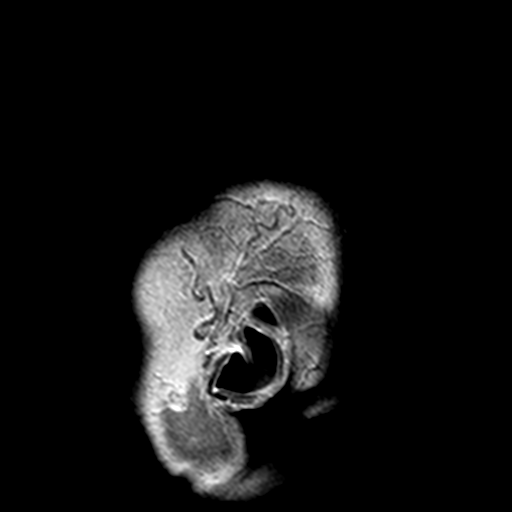

[48 of 48 positions shown; findings below may reference images not displayed]

FINDINGS: Brain: As seen on the prior head CT, there is a complex intra-axial
mass centered in the left parietal lobe which measures approximately
4.6 x 3.1 x 4.1 cm. There are enhancing, solid components with some
restricted diffusion suggesting hypercellularity, and there are also
multiple cystic spaces. There is mild nonenhancing T2 hyperintensity
in the surrounding white matter which may reflect edema and/or
tumor. There is very mild regional mass effect without midline
shift. No second lesion is seen remote from the primary mass, and
the brain is normal in signal elsewhere. No intracranial hemorrhage
or extra-axial fluid collection is evident.

Vascular: Major intracranial vascular flow voids are preserved.

Skull and upper cervical spine: Unremarkable bone marrow signal.

Sinuses/Orbits: Unremarkable orbits. Paranasal sinuses and mastoid
air cells are clear.

Other: None.
IMPRESSION: Left parietal mass concerning for high-grade primary CNS neoplasm
such as glioblastoma. Mild surrounding edema and/or nonenhancing
tumor.
